# Patient Record
Sex: Female | Born: 1945
Health system: Southern US, Community
[De-identification: ages and names within clinical notes are randomized; demographics above are authoritative.]

## PROBLEM LIST (undated history)

## (undated) DIAGNOSIS — G629 Polyneuropathy, unspecified: Secondary | ICD-10-CM

## (undated) DIAGNOSIS — I10 Essential (primary) hypertension: Secondary | ICD-10-CM

## (undated) DIAGNOSIS — H269 Unspecified cataract: Secondary | ICD-10-CM

## (undated) DIAGNOSIS — K219 Gastro-esophageal reflux disease without esophagitis: Secondary | ICD-10-CM

## (undated) DIAGNOSIS — M8580 Other specified disorders of bone density and structure, unspecified site: Secondary | ICD-10-CM

## (undated) DIAGNOSIS — M797 Fibromyalgia: Secondary | ICD-10-CM

## (undated) DIAGNOSIS — F419 Anxiety disorder, unspecified: Secondary | ICD-10-CM

## (undated) DIAGNOSIS — R413 Other amnesia: Secondary | ICD-10-CM

## (undated) DIAGNOSIS — S62109A Fracture of unspecified carpal bone, unspecified wrist, initial encounter for closed fracture: Secondary | ICD-10-CM

## (undated) DIAGNOSIS — M1990 Unspecified osteoarthritis, unspecified site: Secondary | ICD-10-CM

## (undated) DIAGNOSIS — K589 Irritable bowel syndrome without diarrhea: Secondary | ICD-10-CM

## (undated) DIAGNOSIS — R51 Headache: Secondary | ICD-10-CM

## (undated) DIAGNOSIS — E785 Hyperlipidemia, unspecified: Secondary | ICD-10-CM

## (undated) DIAGNOSIS — E559 Vitamin D deficiency, unspecified: Secondary | ICD-10-CM

## (undated) DIAGNOSIS — IMO0002 Reserved for concepts with insufficient information to code with codable children: Secondary | ICD-10-CM

## (undated) HISTORY — PX: TOE SURGERY: SHX1073

## (undated) HISTORY — PX: APPENDECTOMY: SHX54

## (undated) HISTORY — PX: BREAST EXCISIONAL BIOPSY: SUR124

## (undated) HISTORY — PX: ABDOMINAL HYSTERECTOMY: SHX81

## (undated) HISTORY — DX: Essential (primary) hypertension: I10

## (undated) HISTORY — PX: WISDOM TOOTH EXTRACTION: SHX21

## (undated) HISTORY — DX: Other specified disorders of bone density and structure, unspecified site: M85.80

## (undated) HISTORY — DX: Hyperlipidemia, unspecified: E78.5

## (undated) HISTORY — DX: Unspecified cataract: H26.9

## (undated) HISTORY — DX: Irritable bowel syndrome, unspecified: K58.9

## (undated) HISTORY — DX: Headache: R51

## (undated) HISTORY — DX: Reserved for concepts with insufficient information to code with codable children: IMO0002

## (undated) HISTORY — PX: OOPHORECTOMY: SHX86

## (undated) HISTORY — PX: CHOLECYSTECTOMY: SHX55

## (undated) HISTORY — DX: Fibromyalgia: M79.7

## (undated) HISTORY — DX: Other amnesia: R41.3

## (undated) HISTORY — DX: Polyneuropathy, unspecified: G62.9

## (undated) HISTORY — PX: BLADDER SURGERY: SHX569

## (undated) HISTORY — DX: Gastro-esophageal reflux disease without esophagitis: K21.9

## (undated) HISTORY — DX: Fracture of unspecified carpal bone, unspecified wrist, initial encounter for closed fracture: S62.109A

## (undated) HISTORY — DX: Unspecified osteoarthritis, unspecified site: M19.90

## (undated) HISTORY — DX: Vitamin D deficiency, unspecified: E55.9

## (undated) HISTORY — DX: Anxiety disorder, unspecified: F41.9

---

## 1998-09-20 ENCOUNTER — Emergency Department (HOSPITAL_COMMUNITY): Admission: EM | Admit: 1998-09-20 | Discharge: 1998-09-20 | Payer: Self-pay | Admitting: Emergency Medicine

## 1998-09-20 ENCOUNTER — Encounter: Payer: Self-pay | Admitting: Emergency Medicine

## 1998-09-24 ENCOUNTER — Encounter: Admission: RE | Admit: 1998-09-24 | Discharge: 1998-12-23 | Payer: Self-pay | Admitting: *Deleted

## 1998-12-05 ENCOUNTER — Encounter: Admission: RE | Admit: 1998-12-05 | Discharge: 1998-12-05 | Payer: Self-pay | Admitting: *Deleted

## 2001-05-10 ENCOUNTER — Other Ambulatory Visit: Admission: RE | Admit: 2001-05-10 | Discharge: 2001-05-10 | Payer: Self-pay | Admitting: Family Medicine

## 2001-07-13 ENCOUNTER — Ambulatory Visit (HOSPITAL_COMMUNITY): Admission: RE | Admit: 2001-07-13 | Discharge: 2001-07-13 | Payer: Self-pay | Admitting: Internal Medicine

## 2001-07-15 ENCOUNTER — Emergency Department (HOSPITAL_COMMUNITY): Admission: EM | Admit: 2001-07-15 | Discharge: 2001-07-15 | Payer: Self-pay | Admitting: Emergency Medicine

## 2001-07-15 ENCOUNTER — Encounter: Payer: Self-pay | Admitting: Emergency Medicine

## 2002-11-23 ENCOUNTER — Emergency Department (HOSPITAL_COMMUNITY): Admission: EM | Admit: 2002-11-23 | Discharge: 2002-11-24 | Payer: Self-pay | Admitting: Emergency Medicine

## 2002-12-03 ENCOUNTER — Emergency Department (HOSPITAL_COMMUNITY): Admission: EM | Admit: 2002-12-03 | Discharge: 2002-12-03 | Payer: Self-pay | Admitting: *Deleted

## 2003-05-11 ENCOUNTER — Encounter (INDEPENDENT_AMBULATORY_CARE_PROVIDER_SITE_OTHER): Payer: Self-pay | Admitting: Internal Medicine

## 2003-05-11 ENCOUNTER — Ambulatory Visit (HOSPITAL_COMMUNITY): Admission: RE | Admit: 2003-05-11 | Discharge: 2003-05-11 | Payer: Self-pay | Admitting: Internal Medicine

## 2003-05-18 ENCOUNTER — Ambulatory Visit (HOSPITAL_COMMUNITY): Admission: RE | Admit: 2003-05-18 | Discharge: 2003-05-18 | Payer: Self-pay | Admitting: Internal Medicine

## 2003-05-18 ENCOUNTER — Encounter (INDEPENDENT_AMBULATORY_CARE_PROVIDER_SITE_OTHER): Payer: Self-pay | Admitting: Internal Medicine

## 2003-07-05 ENCOUNTER — Ambulatory Visit (HOSPITAL_BASED_OUTPATIENT_CLINIC_OR_DEPARTMENT_OTHER): Admission: RE | Admit: 2003-07-05 | Discharge: 2003-07-05 | Payer: Self-pay | Admitting: Orthopedic Surgery

## 2003-07-05 ENCOUNTER — Encounter (INDEPENDENT_AMBULATORY_CARE_PROVIDER_SITE_OTHER): Payer: Self-pay | Admitting: Specialist

## 2003-08-01 ENCOUNTER — Ambulatory Visit (HOSPITAL_COMMUNITY): Admission: RE | Admit: 2003-08-01 | Discharge: 2003-08-01 | Payer: Self-pay | Admitting: Family Medicine

## 2003-08-01 ENCOUNTER — Encounter: Payer: Self-pay | Admitting: Family Medicine

## 2004-11-20 ENCOUNTER — Observation Stay (HOSPITAL_COMMUNITY): Admission: EM | Admit: 2004-11-20 | Discharge: 2004-11-21 | Payer: Self-pay | Admitting: Emergency Medicine

## 2004-11-20 ENCOUNTER — Ambulatory Visit: Payer: Self-pay | Admitting: *Deleted

## 2004-11-21 ENCOUNTER — Ambulatory Visit: Payer: Self-pay

## 2005-03-10 ENCOUNTER — Encounter: Admission: RE | Admit: 2005-03-10 | Discharge: 2005-03-25 | Payer: Self-pay | Admitting: Orthopedic Surgery

## 2005-10-02 ENCOUNTER — Other Ambulatory Visit: Admission: RE | Admit: 2005-10-02 | Discharge: 2005-10-02 | Payer: Self-pay | Admitting: Family Medicine

## 2006-01-01 ENCOUNTER — Ambulatory Visit: Payer: Self-pay | Admitting: Internal Medicine

## 2006-06-04 ENCOUNTER — Emergency Department (HOSPITAL_COMMUNITY): Admission: EM | Admit: 2006-06-04 | Discharge: 2006-06-05 | Payer: Self-pay | Admitting: Emergency Medicine

## 2007-02-17 ENCOUNTER — Ambulatory Visit: Payer: Self-pay | Admitting: Internal Medicine

## 2008-10-16 ENCOUNTER — Encounter: Admission: RE | Admit: 2008-10-16 | Discharge: 2008-11-02 | Payer: Self-pay | Admitting: Orthopedic Surgery

## 2008-11-03 ENCOUNTER — Encounter: Admission: RE | Admit: 2008-11-03 | Discharge: 2008-12-11 | Payer: Self-pay | Admitting: Orthopedic Surgery

## 2009-02-16 ENCOUNTER — Observation Stay (HOSPITAL_COMMUNITY): Admission: EM | Admit: 2009-02-16 | Discharge: 2009-02-17 | Payer: Self-pay | Admitting: Emergency Medicine

## 2009-02-16 ENCOUNTER — Ambulatory Visit: Payer: Self-pay | Admitting: Cardiovascular Disease

## 2009-02-27 DIAGNOSIS — R079 Chest pain, unspecified: Secondary | ICD-10-CM | POA: Insufficient documentation

## 2009-02-27 DIAGNOSIS — Z8711 Personal history of peptic ulcer disease: Secondary | ICD-10-CM | POA: Insufficient documentation

## 2009-02-27 DIAGNOSIS — R002 Palpitations: Secondary | ICD-10-CM | POA: Insufficient documentation

## 2009-02-27 DIAGNOSIS — K5730 Diverticulosis of large intestine without perforation or abscess without bleeding: Secondary | ICD-10-CM | POA: Insufficient documentation

## 2009-02-27 DIAGNOSIS — E785 Hyperlipidemia, unspecified: Secondary | ICD-10-CM | POA: Insufficient documentation

## 2009-02-27 DIAGNOSIS — IMO0001 Reserved for inherently not codable concepts without codable children: Secondary | ICD-10-CM | POA: Insufficient documentation

## 2009-02-27 DIAGNOSIS — J309 Allergic rhinitis, unspecified: Secondary | ICD-10-CM | POA: Insufficient documentation

## 2009-02-27 DIAGNOSIS — M1990 Unspecified osteoarthritis, unspecified site: Secondary | ICD-10-CM | POA: Insufficient documentation

## 2009-02-28 ENCOUNTER — Encounter: Payer: Self-pay | Admitting: Cardiovascular Disease

## 2009-02-28 ENCOUNTER — Ambulatory Visit: Payer: Self-pay

## 2009-02-28 ENCOUNTER — Ambulatory Visit: Payer: Self-pay | Admitting: Cardiovascular Disease

## 2009-02-28 DIAGNOSIS — G609 Hereditary and idiopathic neuropathy, unspecified: Secondary | ICD-10-CM | POA: Insufficient documentation

## 2009-04-28 ENCOUNTER — Emergency Department (HOSPITAL_COMMUNITY): Admission: EM | Admit: 2009-04-28 | Discharge: 2009-04-28 | Payer: Self-pay | Admitting: Emergency Medicine

## 2009-05-03 ENCOUNTER — Telehealth: Payer: Self-pay | Admitting: Cardiovascular Disease

## 2009-05-29 ENCOUNTER — Ambulatory Visit (HOSPITAL_COMMUNITY): Admission: RE | Admit: 2009-05-29 | Discharge: 2009-05-29 | Payer: Self-pay | Admitting: Urology

## 2009-07-12 ENCOUNTER — Encounter (INDEPENDENT_AMBULATORY_CARE_PROVIDER_SITE_OTHER): Payer: Self-pay | Admitting: *Deleted

## 2009-11-06 ENCOUNTER — Ambulatory Visit: Payer: Self-pay | Admitting: Internal Medicine

## 2009-11-06 DIAGNOSIS — K589 Irritable bowel syndrome without diarrhea: Secondary | ICD-10-CM | POA: Insufficient documentation

## 2009-11-06 DIAGNOSIS — Z9189 Other specified personal risk factors, not elsewhere classified: Secondary | ICD-10-CM | POA: Insufficient documentation

## 2009-11-07 DIAGNOSIS — R1013 Epigastric pain: Secondary | ICD-10-CM | POA: Insufficient documentation

## 2009-11-08 ENCOUNTER — Ambulatory Visit: Payer: Self-pay | Admitting: Internal Medicine

## 2009-11-08 ENCOUNTER — Ambulatory Visit (HOSPITAL_COMMUNITY): Admission: RE | Admit: 2009-11-08 | Discharge: 2009-11-08 | Payer: Self-pay | Admitting: Internal Medicine

## 2009-11-09 ENCOUNTER — Encounter: Payer: Self-pay | Admitting: Internal Medicine

## 2010-02-06 ENCOUNTER — Encounter: Admission: RE | Admit: 2010-02-06 | Discharge: 2010-02-06 | Payer: Self-pay | Admitting: Family Medicine

## 2010-03-12 ENCOUNTER — Encounter: Payer: Self-pay | Admitting: Internal Medicine

## 2010-10-06 ENCOUNTER — Encounter: Admission: RE | Admit: 2010-10-06 | Discharge: 2010-10-06 | Payer: Self-pay | Admitting: Orthopedic Surgery

## 2010-10-14 ENCOUNTER — Encounter
Admission: RE | Admit: 2010-10-14 | Discharge: 2010-12-03 | Payer: Self-pay | Source: Home / Self Care | Attending: Orthopedic Surgery | Admitting: Orthopedic Surgery

## 2010-10-22 ENCOUNTER — Ambulatory Visit (HOSPITAL_COMMUNITY)
Admission: RE | Admit: 2010-10-22 | Discharge: 2010-10-22 | Payer: Self-pay | Source: Home / Self Care | Attending: Internal Medicine | Admitting: Internal Medicine

## 2010-10-31 ENCOUNTER — Emergency Department (HOSPITAL_COMMUNITY)
Admission: EM | Admit: 2010-10-31 | Discharge: 2010-10-31 | Payer: Self-pay | Source: Home / Self Care | Admitting: Emergency Medicine

## 2010-11-24 ENCOUNTER — Encounter: Payer: Self-pay | Admitting: Internal Medicine

## 2010-12-03 NOTE — Assessment & Plan Note (Signed)
Summary: RT ABD PAIN/GU   Primary Care Provider:  Paulita Cradle  Chief Complaint:  abd pain.  History of Present Illness:  65 year old lady comes to see me with a three-month history of right upper quadrant abdominal pain states she noticed that she she was helping move her ailinging, sick brother in the bed. Has had intermittent symptoms ever since she states symptoms precipitated by caffeine intake. She denies typical reflux symptoms which are now well controlled on omeprazole 20 mg orally twice daily. She's having identified her dysphagia. History of distant EGD with esophageal dilation with good results. Gallbladder removed some 10 years ago. Recent ultrasound of the RUQ demonstrated stable hemangiomas,  no biliary dilation or other abnormality. Her she's not any associated nausea, vomiting no melena or hematochezia ; last colonoscopy was back in 2001/03/24 ( diverticulosis). She denies weight loss. She denies early satiety. She has been seenin this practice in years past by Dr. Karilyn Cota  Long history of right-sided abdominal pain felt to be related to irritable bowel syndrome.  She does have a history of fibromyalgia-symptoms well-controlled currently on Neurontin. Distant history of peptic ulcer disease and H. pylori status post treatment per patient report.    Current Medications (verified): 1)  Aspirin 81 Mg Tbec (Aspirin) .... Take One Tablet By Mouth Daily 2)  Zocor 40 Mg Tabs (Simvastatin) .Marland Kitchen.. 1 Tab By Mouth Once Daily 3)  Omeprazole 20 Mg Cpdr (Omeprazole) .... Take 1 Tablet By Mouth Two Times A Day 4)  Neurontin 300 Mg Caps (Gabapentin) .... Take 1 Tablet By Mouth Three Times A Day  Allergies (verified): 1)  ! Aspirin  Past History:  Past Medical History: Last updated: 02/27/2009 Current Problems:  PALPITATIONS (ICD-785.1) CHEST PAIN (ICD-786.50) PEPTIC ULCER DISEASE, HX OF (ICD-V12.71) GERD (ICD-530.81) DYSLIPIDEMIA (ICD-272.4) FIBROMYALGIA (ICD-729.1) DIVERTICULOSIS, MILD  (ICD-562.10) OSTEOARTHRITIS (ICD-715.90) ALLERGIC RHINITIS (ICD-477.9)   history of hiatal hernia  acne rosacea. history of migraines.  Family History: Last updated: November 16, 2009  Mother deceased at 4 with history CHF.  Father   deceased in his 75's from MI.  She has several siblings who have positive   coronary artery disease, 1 sister with CHF.  One brother deceased with lymphoma 03/24/09.  Social History: Last updated: 02/27/2009 She lives in [place] with her husband.  She is retired   from [employer].  She did a lot of floor work.  Denies any   tobacco, EtOH, or herbal medication use.  She follows a regular diet.   No routine exercise secondary to fibromyalgia discomfort.   Past Surgical History:  hysterectomy  cholecystectomy  Excisional biopsy, mass of left long finger. 2nd toe on right foot  Family History:  Mother deceased at 67 with history CHF.  Father   deceased in his 95's from MI.  She has several siblings who have positive   coronary artery disease, 1 sister with CHF.  One brother deceased with lymphoma Mar 24, 2009.  Vital Signs:  Patient profile:   65 year old female Height:      62.5 inches Weight:      192 pounds BMI:     34.68 Temp:     98.1 degrees F oral BP sitting:   150 / 80  (left arm) Cuff size:   regular  Vitals Entered By: Cloria Spring LPN 16-Nov-2009 2:40 PM)  Physical Exam  General:  pleasant alert conversant no acute distress Eyes:  no scleral icterus the conjunctiva pink Abdomen:  nondistended somewhat obese positive bowel sounds  she does have localized epigastric tenderness to palpation no appreciable mass or hepatosplenomegaly extremities have no edema  Impression & Recommendations: Impression: 65 year old lady with a right upper quadrant abdominal discomfort noted after a strenuous physical exercise. Interestingly, symptoms worsened with intake of caffeine. She has notable epigastric tenderness on physical examination today.  She  has a history of fibromyalgia. Her gallbladder is out.  Recent right upper quadrant ultrasound reassuring as far as any distal GI pathology is concerned.  Recommendations: Diagnostic EGD in the near future. Risks, benefits, limitations, alternatives were reviewed. Her questions are answered. Further recommendations once a diagnostic EGD has been performed.            Appended Document: Orders Update-charge    Clinical Lists Changes  Problems: Added new problem of EPIGASTRIC PAIN (ICD-789.06) Orders: Added new Service order of Est. Patient Level IV (14782) - Signed

## 2010-12-03 NOTE — Letter (Signed)
Summary: ABD US/[facility]  ABD US/[facility]   Imported By: Diana Eves 11/09/2009 11:21:43  _____________________________________________________________________  External Attachment:    Type:   Image     Comment:   External Document

## 2010-12-03 NOTE — Letter (Signed)
Summary: External Other  External Other   Imported By: Peggyann Shoals 03/12/2010 09:36:30  _____________________________________________________________________  External Attachment:    Type:   Image     Comment:   External Document

## 2010-12-03 NOTE — Progress Notes (Signed)
Summary: METORPROL D/C VS SHORT TERM  Phone Note Call from Patient Call back at Home Phone [phone]   Caller: Patient Reason for Call: Talk to Nurse Summary of Call: WHEN SHOULD PT BE D/C FROM METOPROLOL  PT WAS TOLD SHE WILL BE ON MEDS FOR A SHORT TERM.  Initial call taken by: Lorne Skeens,  May 03, 2009 2:59 PM  Follow-up for Phone Call        Left message to call back Deliah Goody, RN  May 03, 2009 4:24 PM   Additional Follow-up for Phone Call Additional follow up Details #1::        spoke with pt, she will take 1/2 tablet everyday for the next 2 days and then stop metoprolol. she will call with problems Deliah Goody, RN  May 03, 2009 4:56 PM

## 2010-12-03 NOTE — Letter (Signed)
Summary: EGD ORDER  EGD ORDER   Imported By: Ave Filter 11/09/2009 13:08:24  _____________________________________________________________________  External Attachment:    Type:   Image     Comment:   External Document

## 2010-12-03 NOTE — Letter (Signed)
Summary: Appointment - Reminder 2  [facility], Main Office  [address]   [place], [place] [address]   Phone: [phone]  Fax: [phone]     July 12, 2009 MRN: [id number]   [facility] [address] [place], [place]  [address]   Dear [name],  Our records indicate that it is time to schedule a follow-up appointment with Dr. Eden Emms. It is very important that we reach you to schedule this appointment. We look forward to participating in your health care needs. Please contact us at the number listed above at your earliest convenience to schedule your appointment.  If you are unable to make an appointment at this time, give [place] a call so we can update our records.  Sincerely,   [name] [facility] Scheduling Team

## 2010-12-03 NOTE — Assessment & Plan Note (Signed)
Summary: eph   CC:  echo.  History of Present Illness: Charlotte Aguirre is a patient of Birdena Jubilee at [facility]. she was recently seen in the hospital by Dr. Sherryl Manges.  No palpitations.  Her telemetry showed PACs.  No evidence of SVT or atrial fibrillation.  He was discharged home on a beta blocker.  Reviewed her discharge summary.  She had a 2-D echocardiogram today which Elster reviewed.  She had mild left atrial enlargement.  There is trivial MR.  The remainder of the exam was normal with an ejection fraction of 65%.  Since discharge the palpitations have improved.  She thinks the beta blocker helps.  She gets occasional palpitations at night when she lays down.  She has not any significant chest pain exertional dyspnea syncope.  She does complain of neuropathy in her feet.  She has a history of fibromyalgia.  She was restarted on Cymbalta which has helped this.  I encouraged her to followup with Caryn Section regarding the neuropathy.  She seems to think she has had a B12 folate and thyroid test recently.  There is no previous history of claudication or PVD.  Current Problems (verified): 1)  Palpitations  (ICD-785.1) 2)  Chest Pain  (ICD-786.50) 3)  Peptic Ulcer Disease, Hx of  (ICD-V12.71) 4)  Gerd  (ICD-530.81) 5)  Dyslipidemia  (ICD-272.4) 6)  Fibromyalgia  (ICD-729.1) 7)  Diverticulosis, Mild  (ICD-562.10) 8)  Osteoarthritis  (ICD-715.90) 9)  Allergic Rhinitis  (ICD-477.9)  Current Medications (verified): 1)  Protonix 40 Mg Tbec (Pantoprazole Sodium) .Marland Kitchen.. 1 Tab By Mouth Once Daily 2)  Aspirin 81 Mg Tbec (Aspirin) .... Take One Tablet By Mouth Daily 3)  Lyrica 75 Mg Caps (Pregabalin) .Marland Kitchen.. 1 Tab By Mouth Two Times A Day 4)  Metoprolol Succinate 25 Mg Xr24h-Tab (Metoprolol Succinate) .... Take One Tablet By Mouth Daily 5)  Zocor 40 Mg Tabs (Simvastatin) .Marland Kitchen.. 1 Tab By Mouth Once Daily  Allergies (verified): No Known Drug Allergies  Past History:  Past Medical History:    Current Problems:       PALPITATIONS (ICD-785.1)    CHEST PAIN (ICD-786.50)    PEPTIC ULCER DISEASE, HX OF (ICD-V12.71)    GERD (ICD-530.81)    DYSLIPIDEMIA (ICD-272.4)    FIBROMYALGIA (ICD-729.1)    DIVERTICULOSIS, MILD (ICD-562.10)    OSTEOARTHRITIS (ICD-715.90)    ALLERGIC RHINITIS (ICD-477.9)     history of hiatal hernia     acne rosacea.    history of migraines. (02/27/2009)  Past Surgical History:     hysterectomy     cholecystectomy     Excisional biopsy, mass of left long finger. (02/27/2009)  Family History:     Mother deceased at 72 with history CHF.  Father      deceased in his 2s from MI.  She has several siblings who have positive      coronary artery disease, 1 sister with CHF.  (02/27/2009)  Social History:    She lives in [place] with her husband.  She is retired      from [employer].  She did a lot of floor work.  Denies any      tobacco, EtOH, or herbal medication use.  She follows a regular diet.      No routine exercise secondary to fibromyalgia discomfort.  (02/27/2009)  Review of Systems       Denies fever, malais, weight loss, blurry vision, decreased visual acuity, cough, sputum, SOB, hemoptysis, pleuritic pain, , heartburn, abdominal pain, melena,  lower extremity edema, claudication, or rash.   Vital Signs:  Patient profile:   65 year old female Height:      62 inches Weight:      190 pounds Pulse rate:   68 / minute Resp:     12 per minute BP sitting:   132 / 77  (right arm)  Vitals Entered By: Kem Parkinson (February 28, 2009 1:34 PM)  Physical Exam  General:  Affect appropriate Healthy:  appears stated age HEENT: normal Neck supple with no adenopathy JVP normal no bruits no thyromegaly Lungs clear with no wheezing and good diaphragmatic motion Heart:  S1/S2 no murmur,rub, gallop or click PMI normal Abdomen: benighn, BS positve, no tenderness, no AAA no bruit.  No HSM or HJR Distal pulses intact with no bruits No edema Neuro non-focal Skin  warm and dry    Impression & Recommendations:  Problem # 1:  PALPITATIONS (ICD-785.1) Benign and improved with BB.  No evidence of structural heart disese.  Cont current therapy Her updated medication list for this problem includes:    Aspirin 81 Mg Tbec (Aspirin) .Marland Kitchen... Take one tablet by mouth daily    Metoprolol Succinate 25 Mg Xr24h-tab (Metoprolol succinate) .Marland Kitchen... Take one tablet by mouth daily  Problem # 2:  PERIPHERAL NEUROPATHY (ICD-356.9) F/U Paulita Cradle ? neurontin  Problem # 3:  DYSLIPIDEMIA (ICD-272.4) Lipid and Liver profile in 6 months Her updated medication list for this problem includes:    Zocor 40 Mg Tabs (Simvastatin) .Marland Kitchen... 1 tab by mouth once daily  Patient Instructions: 1)  Your physician recommends that you schedule a follow-up appointment in:  6 month

## 2011-01-13 LAB — DIFFERENTIAL
Basophils Absolute: 0 10*3/uL (ref 0.0–0.1)
Basophils Relative: 0 % (ref 0–1)
Eosinophils Absolute: 0 10*3/uL (ref 0.0–0.7)
Eosinophils Relative: 0 % (ref 0–5)
Lymphocytes Relative: 4 % — ABNORMAL LOW (ref 12–46)
Lymphs Abs: 0.4 10*3/uL — ABNORMAL LOW (ref 0.7–4.0)
Monocytes Absolute: 0.2 10*3/uL (ref 0.1–1.0)
Monocytes Relative: 2 % — ABNORMAL LOW (ref 3–12)
Neutro Abs: 9.5 10*3/uL — ABNORMAL HIGH (ref 1.7–7.7)
Neutrophils Relative %: 94 % — ABNORMAL HIGH (ref 43–77)

## 2011-01-13 LAB — BASIC METABOLIC PANEL
BUN: 17 mg/dL (ref 6–23)
CO2: 27 mEq/L (ref 19–32)
Calcium: 9 mg/dL (ref 8.4–10.5)
Chloride: 105 mEq/L (ref 96–112)
Creatinine, Ser: 0.96 mg/dL (ref 0.4–1.2)
GFR calc Af Amer: >60 mL/min (ref >60)
GFR calc non Af Amer: 58 mL/min — ABNORMAL LOW (ref >60)
Glucose, Bld: 136 mg/dL — ABNORMAL HIGH (ref 70–99)
Potassium: 3.7 mEq/L (ref 3.5–5.1)
Sodium: 140 mEq/L (ref 135–145)

## 2011-01-13 LAB — CBC
HCT: 41.2 % (ref 36.0–46.0)
Hemoglobin: 13.8 g/dL (ref 12.0–15.0)
MCH: 26.3 pg (ref 26.0–34.0)
MCHC: 33.5 g/dL (ref 30.0–36.0)
MCV: 78.6 fL (ref 78.0–100.0)
Platelets: 214 10*3/uL (ref 150–400)
RBC: 5.24 MIL/uL — ABNORMAL HIGH (ref 3.87–5.11)
RDW: 14.7 % (ref 11.5–15.5)
WBC: 10.1 10*3/uL (ref 4.0–10.5)

## 2011-02-10 LAB — CBC
HCT: 38.9 % (ref 36.0–46.0)
Hemoglobin: 13.2 g/dL (ref 12.0–15.0)
MCHC: 33.9 g/dL (ref 30.0–36.0)
MCV: 84.7 fL (ref 78.0–100.0)
Platelets: 205 10*3/uL (ref 150–400)
RBC: 4.6 MIL/uL (ref 3.87–5.11)
RDW: 13.3 % (ref 11.5–15.5)
WBC: 8.6 10*3/uL (ref 4.0–10.5)

## 2011-02-10 LAB — URINE MICROSCOPIC-ADD ON

## 2011-02-10 LAB — URINALYSIS, ROUTINE W REFLEX MICROSCOPIC
Bilirubin Urine: NEGATIVE
Glucose, UA: NEGATIVE mg/dL
Ketones, ur: NEGATIVE mg/dL
Nitrite: NEGATIVE
Protein, ur: NEGATIVE mg/dL
Specific Gravity, Urine: 1.02 (ref 1.005–1.030)
Urobilinogen, UA: 0.2 mg/dL (ref 0.0–1.0)
pH: 7 (ref 5.0–8.0)

## 2011-02-10 LAB — BASIC METABOLIC PANEL
BUN: 10 mg/dL (ref 6–23)
CO2: 27 mEq/L (ref 19–32)
Calcium: 8.5 mg/dL (ref 8.4–10.5)
Chloride: 104 mEq/L (ref 96–112)
Creatinine, Ser: 0.72 mg/dL (ref 0.4–1.2)
GFR calc Af Amer: >60 mL/min (ref >60)
GFR calc non Af Amer: >60 mL/min (ref >60)
Glucose, Bld: 119 mg/dL — ABNORMAL HIGH (ref 70–99)
Potassium: 3.8 mEq/L (ref 3.5–5.1)
Sodium: 141 mEq/L (ref 135–145)

## 2011-02-10 LAB — DIFFERENTIAL
Basophils Absolute: 0 10*3/uL (ref 0.0–0.1)
Basophils Relative: 0 % (ref 0–1)
Eosinophils Absolute: 0.1 10*3/uL (ref 0.0–0.7)
Eosinophils Relative: 1 % (ref 0–5)
Lymphocytes Relative: 16 % (ref 12–46)
Lymphs Abs: 1.3 10*3/uL (ref 0.7–4.0)
Monocytes Absolute: 0.3 10*3/uL (ref 0.1–1.0)
Monocytes Relative: 4 % (ref 3–12)
Neutro Abs: 6.9 10*3/uL (ref 1.7–7.7)
Neutrophils Relative %: 80 % — ABNORMAL HIGH (ref 43–77)

## 2011-02-12 LAB — CBC
HCT: 39 % (ref 36.0–46.0)
HCT: 41 % (ref 36.0–46.0)
HCT: 41.7 % (ref 36.0–46.0)
Hemoglobin: 13 g/dL (ref 12.0–15.0)
Hemoglobin: 13.4 g/dL (ref 12.0–15.0)
Hemoglobin: 14.1 g/dL (ref 12.0–15.0)
MCHC: 32.6 g/dL (ref 30.0–36.0)
MCHC: 33.3 g/dL (ref 30.0–36.0)
MCHC: 33.7 g/dL (ref 30.0–36.0)
MCV: 84.9 fL (ref 78.0–100.0)
MCV: 85.2 fL (ref 78.0–100.0)
MCV: 85.7 fL (ref 78.0–100.0)
Platelets: 205 10*3/uL (ref 150–400)
Platelets: 221 10*3/uL (ref 150–400)
Platelets: 223 10*3/uL (ref 150–400)
RBC: 4.55 MIL/uL (ref 3.87–5.11)
RBC: 4.82 MIL/uL (ref 3.87–5.11)
RBC: 4.91 MIL/uL (ref 3.87–5.11)
RDW: 13.1 % (ref 11.5–15.5)
RDW: 13.3 % (ref 11.5–15.5)
RDW: 13.4 % (ref 11.5–15.5)
WBC: 7.5 10*3/uL (ref 4.0–10.5)
WBC: 8.2 10*3/uL (ref 4.0–10.5)
WBC: 8.7 10*3/uL (ref 4.0–10.5)

## 2011-02-12 LAB — DIFFERENTIAL
Basophils Absolute: 0 10*3/uL (ref 0.0–0.1)
Basophils Absolute: 0.1 10*3/uL (ref 0.0–0.1)
Basophils Relative: 1 % (ref 0–1)
Basophils Relative: 1 % (ref 0–1)
Eosinophils Absolute: 0.2 10*3/uL (ref 0.0–0.7)
Eosinophils Absolute: 0.2 10*3/uL (ref 0.0–0.7)
Eosinophils Relative: 2 % (ref 0–5)
Eosinophils Relative: 3 % (ref 0–5)
Lymphocytes Relative: 34 % (ref 12–46)
Lymphocytes Relative: 39 % (ref 12–46)
Lymphs Abs: 2.8 10*3/uL (ref 0.7–4.0)
Lymphs Abs: 2.9 10*3/uL (ref 0.7–4.0)
Monocytes Absolute: 0.5 10*3/uL (ref 0.1–1.0)
Monocytes Absolute: 0.5 10*3/uL (ref 0.1–1.0)
Monocytes Relative: 6 % (ref 3–12)
Monocytes Relative: 7 % (ref 3–12)
Neutro Abs: 3.8 10*3/uL (ref 1.7–7.7)
Neutro Abs: 4.8 10*3/uL (ref 1.7–7.7)
Neutrophils Relative %: 50 % (ref 43–77)
Neutrophils Relative %: 58 % (ref 43–77)

## 2011-02-12 LAB — HEMOGLOBIN A1C
Hgb A1c MFr Bld: 5.6 % (ref 4.6–6.1)
Mean Plasma Glucose: 114 mg/dL

## 2011-02-12 LAB — POCT I-STAT, CHEM 8
BUN: 14 mg/dL (ref 6–23)
Calcium, Ion: 1.18 mmol/L (ref 1.12–1.32)
Chloride: 106 mEq/L (ref 96–112)
Creatinine, Ser: 1.1 mg/dL (ref 0.4–1.2)
Glucose, Bld: 99 mg/dL (ref 70–99)
HCT: 43 % (ref 36.0–46.0)
Hemoglobin: 14.6 g/dL (ref 12.0–15.0)
Potassium: 4.2 mEq/L (ref 3.5–5.1)
Sodium: 142 mEq/L (ref 135–145)
TCO2: 27 mmol/L (ref 0–100)

## 2011-02-12 LAB — POCT CARDIAC MARKERS
CKMB, poc: 1.1 ng/mL (ref 1.0–8.0)
CKMB, poc: <1 ng/mL — ABNORMAL LOW (ref 1.0–8.0)
Myoglobin, poc: 42.3 ng/mL (ref 12–200)
Myoglobin, poc: 62.9 ng/mL (ref 12–200)
Troponin i, poc: <0.05 ng/mL (ref 0.00–0.09)
Troponin i, poc: <0.05 ng/mL (ref 0.00–0.09)

## 2011-02-12 LAB — COMPREHENSIVE METABOLIC PANEL
ALT: 16 U/L (ref 0–35)
AST: 18 U/L (ref 0–37)
Albumin: 3.1 g/dL — ABNORMAL LOW (ref 3.5–5.2)
Alkaline Phosphatase: 99 U/L (ref 39–117)
BUN: 14 mg/dL (ref 6–23)
CO2: 29 mEq/L (ref 19–32)
Calcium: 8.8 mg/dL (ref 8.4–10.5)
Chloride: 106 mEq/L (ref 96–112)
Creatinine, Ser: 0.82 mg/dL (ref 0.4–1.2)
GFR calc Af Amer: >60 mL/min (ref >60)
GFR calc non Af Amer: >60 mL/min (ref >60)
Glucose, Bld: 95 mg/dL (ref 70–99)
Potassium: 4 mEq/L (ref 3.5–5.1)
Sodium: 140 mEq/L (ref 135–145)
Total Bilirubin: 0.1 mg/dL — ABNORMAL LOW (ref 0.3–1.2)
Total Protein: 5.8 g/dL — ABNORMAL LOW (ref 6.0–8.3)

## 2011-02-12 LAB — CK TOTAL AND CKMB (NOT AT ARMC)
CK, MB: 1.3 ng/mL (ref 0.3–4.0)
Relative Index: INVALID (ref 0.0–2.5)
Total CK: 70 U/L (ref 7–177)

## 2011-02-12 LAB — CARDIAC PANEL(CRET KIN+CKTOT+MB+TROPI)
CK, MB: 1 ng/mL (ref 0.3–4.0)
CK, MB: 1.1 ng/mL (ref 0.3–4.0)
Relative Index: INVALID (ref 0.0–2.5)
Relative Index: INVALID (ref 0.0–2.5)
Total CK: 49 U/L (ref 7–177)
Total CK: 65 U/L (ref 7–177)
Troponin I: <0.01 ng/mL (ref 0.00–0.06)
Troponin I: <0.01 ng/mL (ref 0.00–0.06)

## 2011-02-12 LAB — LIPID PANEL
Cholesterol: 162 mg/dL (ref 0–200)
HDL: 33 mg/dL — ABNORMAL LOW (ref >39)
LDL Cholesterol: 93 mg/dL (ref 0–99)
Total CHOL/HDL Ratio: 4.9 RATIO
Triglycerides: 180 mg/dL — ABNORMAL HIGH (ref <150)
VLDL: 36 mg/dL (ref 0–40)

## 2011-02-12 LAB — TSH: TSH: 1.526 u[IU]/mL (ref 0.350–4.500)

## 2011-02-12 LAB — BRAIN NATRIURETIC PEPTIDE: Pro B Natriuretic peptide (BNP): 60 pg/mL (ref 0.0–100.0)

## 2011-02-12 LAB — HEPARIN LEVEL (UNFRACTIONATED): Heparin Unfractionated: 0.32 IU/mL (ref 0.30–0.70)

## 2011-02-12 LAB — TROPONIN I: Troponin I: <0.01 ng/mL (ref 0.00–0.06)

## 2011-03-18 NOTE — H&P (Signed)
Charlotte Aguirre, Charlotte Aguirre                   ACCOUNT NO.:  [id number]   MEDICAL RECORD NO.:  [id number]          PATIENT TYPE:  OBV   LOCATION:  2011                         FACILITY:  [facility]   PHYSICIAN:  Charlotte Pick. Eden Emms, MD, FACCDATE OF BIRTH:  [birth year]   DATE OF ADMISSION:  02/16/2009  DATE OF DISCHARGE:                              HISTORY & PHYSICAL   PRIMARY CARDIOLOGIST:  Charlotte be Dr. Noralyn Pick. Eden Emms, MD, Charlotte Aguirre, previously  Charlotte Binning, MD [facility].   PRIMARY CARE PHYSICIAN:  The patient is followed by Charlotte Aguirre,  nurse practitioner at [facility].  Charlotte Aguirre is a  pleasant 65 year old Caucasian female, we initially evaluated back in  2006 at which time she presented for atypical chest pain, was admitted,  ruled out and discharged home.  She also at that time complained of  heart skipping beats and fluttering in her chest.  She had a stress  Myoview done outpatient that was within normal limits and has had no  problems since then.  She states she still has an occasional skipping,  palpitations in her heart.  The only change in her condition is  increased caffeine intake over this last week.  Yesterday, she noticed  she was having a lot of palpitations when she prepared for bed and when  she laid down.  She states it became more frequent and worrisome.  This  morning, she presented to [facility] where a  Holter monitor was placed on the patient.  She returned home, continued  to have palpitations.  The patient reports nothing was noted on EKG done  at the office there.  She states the palpitations continued and then she  experienced some lightheadedness and associated nausea.  This concerned  her.  She called back to the office and was told to call 911.  The  patient was transported here for further evaluation.  She currently is  in the emergency room in room #5.  Her telemetry monitor showing sinus  rhythm with frequent PACs, occasional  PVC.  A 12-lead EKG showing no  acute findings.  The patient continues to complain of palpitations and  skipping sensation in her chest.  She also describes chest discomfort.  She describes it as a sensation and she feels she needs to burp, a  little bit of pressure.  She denies any dizziness or nausea currently.   PAST MEDICAL HISTORY:  Palpitations; atypical chest pain; previous  stress Myoview negative in 2006; fibromyalgia, treated with Lyrica;  hysterectomy; cholecystectomy; dyslipidemia; GERD; diverticulosis;  previous GI ulcer.   SOCIAL HISTORY:  She lives in [place] with her husband.  She is retired  from [employer].  She did a lot of floor work.  Denies any  tobacco, EtOH, or herbal medication use.  She follows a regular diet.  No routine exercise secondary to fibromyalgia discomfort.   FAMILY HISTORY:  Mother deceased at 82 with history CHF.  Father  deceased in his 62s from MI.  She has several siblings who have positive  coronary artery disease, 1  sister with CHF.   REVIEW OF SYSTEMS:  Positive for chest discomfort as described above,  dizziness as described above, palpitations as described above.  The  patient complains of some numbness on the bottom of her feet that feels  like new pins and needles sensation and episode of nausea as described  above, otherwise has chronic myalgia.  All other systems reviewed and  negative.   ALLERGIES:  No known drug allergies.   MEDICINES:  1. Lyrica, the patient is unclear of dose.  2. Protonix 40.  3. Simvastatin, the patient is unclear of dose.  4. Aspirin 81.   PHYSICAL EXAMINATION:  VITAL SIGNS:  The patient is afebrile, heart rate  87, respirations 16, blood pressure 138/69, and sat 96% on room air.  GENERAL:  In no acute distress.  HEENT: Unremarkable.  NECK:  Supple without lymphadenopathy, bruit, or JVD.  CARDIOVASCULAR:  S1 and S2.  Regular rate and rhythm without murmurs,  rubs, or gallops.  LUNGS:  Clear to  auscultation bilaterally.  SKIN:  Warm and dry.  ABDOMEN:  Soft and nontender.  Positive bowel sounds.  LOWER EXTREMITIES:  Without clubbing, cyanosis, or edema.  NEUROLOGIC:  Alert and oriented x3.  CHEST:  The patient has no reproducible discomfort to deep palpation of  chest.   Chest x-ray showed right lung base atelectasis, otherwise no acute  findings.  EKG, sinus rhythm with PACs and PVCs.  No acute ST or T wave  changes.   LABORATORY DATA:  H&H 14.6 and 43, WBCs 8.2, and platelets 223,000.  Sodium 142, potassium 4.2, BUN 14, creatinine 1.1, and glucose 99.  First point-of-care markers are negative.   IMPRESSION:  Chest pain with palpitations.  The patient presenting with  frequent premature atrial contractions and occasional premature  ventricular contractions in the setting of increased caffeine intake  over the last week.  The patient also with known dyslipidemia,  fibromyalgia, previous negative stress Myoview, and strong family  history of coronary artery disease.  The patient Charlotte be admitted for 23-  hour observation, cycle cardiac markers, check a hemoglobin A1c  secondary to numbness and tingling in feet, check TSH regarding  palpitations, start the patient on Toprol-XL 25 mg daily.  Follow up in  the office with Dr. Eden Aguirre and outpatient echocardiogram.  I went ahead  and arranged this for February 28, 2009.  Echocardiogram at 1 p.m. and Dr.  Eden Aguirre at 1:45.  We Charlotte check blood work and forward a copy of labs to  Charlotte Aguirre at [facility].  If enzymes are negative, we Charlotte  plan on discharging in the morning.  If possible need to consider  cardiac catheterization.  Charlotte Aguirre has been in to examine and  assess the patient, agrees with plan of care.  We Charlotte continue home  aspirin, statin, and Lyrica.  We Charlotte need to clarify home dose on her  Lyrica and Protonix.      Charlotte Aguirre, ACNP      Charlotte Pick. Eden Emms, MD, [facility]  Electronically  Signed    MB/MEDQ  D:  02/16/2009  T:  02/17/2009  Job:  [id number]   cc:   Charlotte Aguirre

## 2011-03-18 NOTE — Discharge Summary (Signed)
NAMEKACIE, HUXTABLE                   ACCOUNT NO.:  [id number]   MEDICAL RECORD NO.:  [id number]          PATIENT TYPE:  OBV   LOCATION:  2011                         FACILITY:  [facility]   PHYSICIAN:  Duke Salvia, MD, FACCDATE OF BIRTH:  [birth year]   DATE OF ADMISSION:  02/16/2009  DATE OF DISCHARGE:  02/17/2009                               DISCHARGE SUMMARY   CARDIOLOGIST:  Theron Arista C. Eden Emms, MD, [facility]   PRIMARY CARE PHYSICIAN:  Paulita Cradle, nurse practitioner at [facility]   REASON FOR ADMISSION:  Chest pain and palpitations.   DISCHARGE DIAGNOSES:  1. Chest pain and palpitations.      a.     Suspect symptomatic premature atrial contractions.  2. Negative stress Myoview 2006.  3. Fibromyalgia.  4. Dyslipidemia.  5. Gastroesophageal reflux disease.  6. Peptic ulcer disease.   ADMISSION HISTORY:  [name] is a 65 year old female with the above  noted past medical history who presented with complaints of chest  discomfort and palpitations.  She had noted PACs on her monitor.  She  also noted increased caffeine intake.  She was admitted for observation  to rule out myocardial infarction by enzymes.  She was also started on  beta-blocker therapy.   HOSPITAL COURSE:  The patient had negative cardiac enzymes x3.  TSH was  normal at 1.526.  Chest x-ray showed right lung basilar atelectasis.  She remained stable overnight and noted just brief episodes of chest  discomfort on the morning of February 17, 2009.  She was evaluated by Dr.  Graciela Husbands and felt to be stable enough for discharge to home.  She has been  set up for a followup in our office with an echocardiogram and an  appointment with Dr. Eden Emms.  Her other medications will be continued as  previously taken.   LABS AND X-RAY DATA:  Hemoglobin 13.4, potassium 4, BUN 14, creatinine  0.82.  LFTs okay, albumin 3.1, hemoglobin A1c 5.6.  Cardiac markers as  noted above.  BNP 60, total cholesterol 162,  triglycerides 180, HDL 33,  LDL 93.  TSH as outlined above.  Chest x-ray as outlined above.   MEDICATIONS:  1. At discharge Lyrica 75 mg twice a day.  2. Protonix 40 mg a day.  3. Simvastatin 40 mg a day.  4. Aspirin 81 mg a day.  5. Toprol XL 25 mg daily - this is a new medication.   ALLERGIES:  No known drug allergies.   DIET:  Low-fat, low-sodium diet.   WOUND CARE:  Is not applicable.   ACTIVITY:  She is to increase her activity slowly.   FOLLOWUP:  1. She will have an echocardiogram April 28 at 1:00 in our office in      [place].  2. She will see Dr. Eden Emms on April 28 at 1:45 at [facility]      in [place].  3. She should contact Paulita Cradle, NP for followup as directed.   Total physician and PA time greater than 30 minutes on this discharge.  Tereso Newcomer, PA-C      Duke Salvia, MD, [facility]  Electronically Signed    SW/MEDQ  D:  02/17/2009  T:  02/17/2009  Job:  [id number]

## 2011-03-21 NOTE — Op Note (Signed)
   [name], Charlotte Aguirre                             ACCOUNT NO.:  [id number]   MEDICAL RECORD NO.:  [id number]                   PATIENT TYPE:  AMB   LOCATION:  DSC                                  FACILITY:  [facility]   PHYSICIAN:  Artist Pais. Mina Marble, M.D.           DATE OF BIRTH:  17-Feb-1946   DATE OF PROCEDURE:  07/05/2003  DATE OF DISCHARGE:                                 OPERATIVE REPORT   PREOPERATIVE DIAGNOSIS:  Mass, left long finger.   POSTOPERATIVE DIAGNOSIS:  Mass, left long finger.   PROCEDURE:  Excisional biopsy, mass of left long finger.   SURGEON:  Artist Pais. Mina Marble, M.D.   ANESTHESIA:  General.   TOURNIQUET TIME:  20 minutes.   COMPLICATIONS:  None.   DRAINS:  None.   DESCRIPTION OF PROCEDURE:  With informed consent, the patient was taken to  the operating room and with the induction of adequate general anesthesia,  the left upper extremity was prepped and draped in the usual sterile  fashion.  Esmarch was used to exsanguinate the limb and the tourniquet was  then placed on 250 mmHg.  At this point in time, a small incision was made  in the MP flexion crease of the left long finger approximately 1 cm in  length and the incision was taken down through the skin and subcutaneous  tissues.  The ulnar and neurovascular bundle was identified and retracted  and a small mass was removed off the sheath of the flexor tendon.  This was  sent for pathologic diagnosis.  The wound was thoroughly irrigated and  closed with 5-0 nylon.  A sterile dressing with 4 x 4's and an Ace wrap was  applied.  The patient tolerated the procedure well and went to the recovery  room in a stable condition.                                               Artist Pais Mina Marble, M.D.    MAW/MEDQ  D:  07/05/2003  T:  07/05/2003  Job:  [id number]

## 2011-04-16 ENCOUNTER — Other Ambulatory Visit: Payer: Self-pay | Admitting: Physical Medicine and Rehabilitation

## 2011-04-16 DIAGNOSIS — M545 Low back pain, unspecified: Secondary | ICD-10-CM

## 2011-04-24 ENCOUNTER — Ambulatory Visit
Admission: RE | Admit: 2011-04-24 | Discharge: 2011-04-24 | Disposition: A | Discharge status: Home / Self Care | Payer: No Typology Code available for payment source | Source: Ambulatory Visit | Attending: Physical Medicine and Rehabilitation | Admitting: Physical Medicine and Rehabilitation

## 2011-04-24 DIAGNOSIS — M545 Low back pain, unspecified: Secondary | ICD-10-CM

## 2011-12-24 ENCOUNTER — Other Ambulatory Visit (HOSPITAL_COMMUNITY): Payer: Self-pay | Admitting: Internal Medicine

## 2011-12-24 DIAGNOSIS — Z139 Encounter for screening, unspecified: Secondary | ICD-10-CM

## 2012-01-01 ENCOUNTER — Ambulatory Visit (HOSPITAL_COMMUNITY)
Admission: RE | Admit: 2012-01-01 | Discharge: 2012-01-01 | Disposition: A | Discharge status: Home / Self Care | Payer: Medicare Other | Source: Ambulatory Visit | Attending: Internal Medicine | Admitting: Internal Medicine

## 2012-01-01 DIAGNOSIS — Z1231 Encounter for screening mammogram for malignant neoplasm of breast: Secondary | ICD-10-CM | POA: Insufficient documentation

## 2012-01-01 DIAGNOSIS — Z139 Encounter for screening, unspecified: Secondary | ICD-10-CM

## 2012-12-09 ENCOUNTER — Other Ambulatory Visit: Payer: Self-pay | Admitting: Internal Medicine

## 2012-12-09 DIAGNOSIS — R209 Unspecified disturbances of skin sensation: Secondary | ICD-10-CM

## 2012-12-09 DIAGNOSIS — IMO0002 Reserved for concepts with insufficient information to code with codable children: Secondary | ICD-10-CM

## 2012-12-10 ENCOUNTER — Ambulatory Visit
Admission: RE | Admit: 2012-12-10 | Discharge: 2012-12-10 | Disposition: A | Discharge status: Home / Self Care | Payer: Medicare Other | Source: Ambulatory Visit | Attending: Internal Medicine | Admitting: Internal Medicine

## 2012-12-10 DIAGNOSIS — IMO0002 Reserved for concepts with insufficient information to code with codable children: Secondary | ICD-10-CM

## 2012-12-10 DIAGNOSIS — R209 Unspecified disturbances of skin sensation: Secondary | ICD-10-CM

## 2013-03-15 ENCOUNTER — Other Ambulatory Visit: Payer: Self-pay | Admitting: Orthopedic Surgery

## 2013-03-15 DIAGNOSIS — M25532 Pain in left wrist: Secondary | ICD-10-CM

## 2013-03-19 ENCOUNTER — Ambulatory Visit
Admission: RE | Admit: 2013-03-19 | Discharge: 2013-03-19 | Disposition: A | Discharge status: Home / Self Care | Payer: Medicare Other | Source: Ambulatory Visit | Attending: Orthopedic Surgery | Admitting: Orthopedic Surgery

## 2013-03-19 DIAGNOSIS — M25532 Pain in left wrist: Secondary | ICD-10-CM

## 2013-12-12 ENCOUNTER — Encounter (INDEPENDENT_AMBULATORY_CARE_PROVIDER_SITE_OTHER): Payer: Self-pay

## 2013-12-12 ENCOUNTER — Ambulatory Visit (INDEPENDENT_AMBULATORY_CARE_PROVIDER_SITE_OTHER): Payer: Medicare Other | Admitting: Family Medicine

## 2013-12-12 ENCOUNTER — Encounter: Payer: Self-pay | Admitting: Family Medicine

## 2013-12-12 VITALS — BP 133/74 | HR 79 | Temp 98.7°F | Wt 198.8 lb

## 2013-12-12 DIAGNOSIS — M1990 Unspecified osteoarthritis, unspecified site: Secondary | ICD-10-CM

## 2013-12-12 DIAGNOSIS — R519 Headache, unspecified: Secondary | ICD-10-CM

## 2013-12-12 DIAGNOSIS — Z8711 Personal history of peptic ulcer disease: Secondary | ICD-10-CM

## 2013-12-12 DIAGNOSIS — R51 Headache: Secondary | ICD-10-CM

## 2013-12-12 DIAGNOSIS — R200 Anesthesia of skin: Secondary | ICD-10-CM

## 2013-12-12 DIAGNOSIS — G609 Hereditary and idiopathic neuropathy, unspecified: Secondary | ICD-10-CM

## 2013-12-12 DIAGNOSIS — R002 Palpitations: Secondary | ICD-10-CM

## 2013-12-12 DIAGNOSIS — R413 Other amnesia: Secondary | ICD-10-CM

## 2013-12-12 DIAGNOSIS — IMO0001 Reserved for inherently not codable concepts without codable children: Secondary | ICD-10-CM

## 2013-12-12 DIAGNOSIS — I10 Essential (primary) hypertension: Secondary | ICD-10-CM

## 2013-12-12 DIAGNOSIS — R209 Unspecified disturbances of skin sensation: Secondary | ICD-10-CM

## 2013-12-12 DIAGNOSIS — E785 Hyperlipidemia, unspecified: Secondary | ICD-10-CM

## 2013-12-12 DIAGNOSIS — K589 Irritable bowel syndrome without diarrhea: Secondary | ICD-10-CM

## 2013-12-12 DIAGNOSIS — K219 Gastro-esophageal reflux disease without esophagitis: Secondary | ICD-10-CM

## 2013-12-12 LAB — POCT CBC
Granulocyte percent: 62.9 %G (ref 37–80)
HCT, POC: 42.7 % (ref 37.7–47.9)
Hemoglobin: 13.1 g/dL (ref 12.2–16.2)
Lymph, poc: 2.6 (ref 0.6–3.4)
MCH, POC: 25 pg — AB (ref 27–31.2)
MCHC: 30.7 g/dL — AB (ref 31.8–35.4)
MCV: 81.3 fL (ref 80–97)
MPV: 9.3 fL (ref 0–99.8)
POC Granulocyte: 4.7 (ref 2–6.9)
POC LYMPH PERCENT: 35.4 %L (ref 10–50)
Platelet Count, POC: 219 10*3/uL (ref 142–424)
RBC: 5.3 M/uL (ref 4.04–5.48)
RDW, POC: 15.1 %
WBC: 7.4 10*3/uL (ref 4.6–10.2)

## 2013-12-12 NOTE — Progress Notes (Signed)
Patient ID: Charlotte Aguirre, female   DOB: 09-26-1946, 68 y.o.   MRN: [id number] SUBJECTIVE: CC: Chief Complaint  Patient presents with  . Acute Visit    c/o pains in head and feels like side of nose not completely open. states been seeing Dr Delphina Cahill in [place]     HPI: Has had headache all her life. Has been having headache in the left side of the head and also difficulty to take a breath out of the left nostril. Blurred vision, has to waer glasses. Annual eye exam. Has h/o cataracts. Never had a brain. Used to see Dr Delphina Cahill. Now moved over to [facility] Has noticed memory not as good.   Past Medical History  Diagnosis Date  . GERD (gastroesophageal reflux disease)   . Fibromyalgia   . Peripheral neuropathy   . Arthritis     thumb and fingers  . Hypertension   . Hyperlipidemia   . IBS (irritable bowel syndrome)    No past surgical history on file. History   Social History  . Marital Status: Married    Spouse Name: N/A    Number of Children: N/A  . Years of Education: N/A   Occupational History  . Not on file.   Social History Main Topics  . Smoking status: Former Smoker    Types: Cigarettes  . Smokeless tobacco: Not on file  . Alcohol Use: No  . Drug Use: No  . Sexual Activity: Not on file   Other Topics Concern  . Not on file   Social History Narrative  . No narrative on file   Family History  Problem Relation Age of Onset  . Dementia Brother    No current outpatient prescriptions on file prior to visit.   No current facility-administered medications on file prior to visit.   Allergies  Allergen Reactions  . Aspirin     REACTION: unknown reaction    There is no immunization history on file for this patient. Prior to Admission medications   Medication Sig Start Date End Date Taking? Authorizing Provider  aspirin 81 MG chewable tablet Chew by mouth daily.   Yes Historical Provider, MD  Calcium Carb-Cholecalciferol (CALCIUM 1000 + D PO) Take by  mouth.   Yes Historical Provider, MD  cholecalciferol (VITAMIN D) 400 UNITS TABS tablet Take 400 Units by mouth.   Yes Historical Provider, MD  Cholecalciferol (VITAMIN D-3 PO) Take 1 tablet by mouth daily.   Yes Historical Provider, MD  gabapentin (NEURONTIN) 300 MG capsule Take 300 mg by mouth 3 (three) times daily.   Yes Historical Provider, MD  hydrochlorothiazide (HYDRODIURIL) 25 MG tablet Take 25 mg by mouth daily.   Yes Historical Provider, MD  Omega-3 Fatty Acids (FISH OIL) 500 MG CAPS Take 2 capsules by mouth daily.   Yes Historical Provider, MD  omeprazole (PRILOSEC) 20 MG capsule Take 20 mg by mouth daily.   Yes Historical Provider, MD  simvastatin (ZOCOR) 40 MG tablet Take 40 mg by mouth daily.   Yes Historical Provider, MD     ROS: As above in the HPI. All other systems are stable or negative.  OBJECTIVE: APPEARANCE:  Patient in no acute distress.The patient appeared well nourished and normally developed. Acyanotic. Waist: VITAL SIGNS:BP 133/74  Pulse 79  Temp(Src) 98.7 F (37.1 C) (Oral)  Wt 198 lb 12.8 oz (90.175 kg) WF Temporal arteries not tender.  SKIN: warm and  Dry without overt rashes, tattoos and scars  HEAD  and Neck: without JVD, Head and scalp: normal Eyes:No scleral icterus. Fundi normal, eye movements normal. Ears: Auricle normal, canal normal, Tympanic membranes normal, insufflation normal. Nose: normal Throat: normal Neck & thyroid: normal  CHEST & LUNGS: Chest wall: normal Lungs: Clear  CVS: Reveals the PMI to be normally located. Regular rhythm, First and Second Heart sounds are normal,  absence of murmurs, rubs or gallops. Peripheral vasculature: Radial pulses: normal Dorsal pedis pulses: normal Posterior pulses: normal  ABDOMEN:  Appearance: obese Benign, no organomegaly, no masses, no Abdominal Aortic enlargement. No Guarding , no rebound. No Bruits. Bowel sounds: normal  RECTAL: N/A GU: N/A  EXTREMETIES:  nonedematous.  MUSCULOSKELETAL:  Spine: normal Joints: intact  NEUROLOGIC: oriented to time,place and person; nonfocal. Strength is normal Sensory is abnormal: left face subjectively numb. Reflexes are normal Cranial Nerves are normal.   MMSE 27/30  ASSESSMENT: Increased severity of headaches - Plan: MR Brain W Wo Contrast  Left facial numbness - Plan: CMP14+EGFR, Vitamin B12, Folate, TSH, POCT CBC, MR Brain W Wo Contrast  Hypertension  DYSLIPIDEMIA  PERIPHERAL NEUROPATHY - Plan: CMP14+EGFR, Vitamin B12, Folate, TSH, POCT CBC  PEPTIC ULCER DISEASE, HX OF  Palpitations  OSTEOARTHRITIS  Irritable bowel syndrome  GERD (gastroesophageal reflux disease)  FIBROMYALGIA - Plan: Vit D  25 hydroxy (rtn osteoporosis monitoring)  Esophageal reflux  Memory impairment  PLAN: Initiate work up for her headaches and neurologic symptoms.  Orders Placed This Encounter  Procedures  . MR Brain W Wo Contrast    Standing Status: Future     Number of Occurrences:      Standing Expiration Date: 02/10/2015    Order Specific Question:  Reason for Exam (SYMPTOM  OR DIAGNOSIS REQUIRED)    Answer:  new type of headache unilateral left sided with facial numbness of the left cheek. r/o intracranial lesion    Order Specific Question:  Preferred imaging location?    Answer:  [id number]    Order Specific Question:  Does the patient have a pacemaker, internal devices, implants, aneury    Answer:  No  . CMP14+EGFR  . Vitamin B12  . Folate  . TSH  . Vit D  25 hydroxy (rtn osteoporosis monitoring)  . POCT CBC   Meds ordered this encounter  Medications  . simvastatin (ZOCOR) 40 MG tablet    Sig: Take 40 mg by mouth daily.  Marland Kitchen omeprazole (PRILOSEC) 20 MG capsule    Sig: Take 20 mg by mouth daily.  Marland Kitchen gabapentin (NEURONTIN) 300 MG capsule    Sig: Take 300 mg by mouth 3 (three) times daily.  . hydrochlorothiazide (HYDRODIURIL) 25 MG tablet    Sig: Take 25 mg by mouth daily.  Marland Kitchen  aspirin 81 MG chewable tablet    Sig: Chew by mouth daily.  . Calcium Carb-Cholecalciferol (CALCIUM 1000 + D PO)    Sig: Take by mouth.  . cholecalciferol (VITAMIN D) 400 UNITS TABS tablet    Sig: Take 400 Units by mouth.  . Cholecalciferol (VITAMIN D-3 PO)    Sig: Take 1 tablet by mouth daily.  . Omega-3 Fatty Acids (FISH OIL) 500 MG CAPS    Sig: Take 2 capsules by mouth daily.   There are no discontinued medications. Return in about 3 weeks (around 01/02/2014) for CPE, Recheck medical problems.  Elveta Rape P. Jacelyn Grip, M.D.

## 2013-12-13 LAB — FOLATE: Folate: 13.9 ng/mL (ref >3.0)

## 2013-12-13 LAB — TSH: TSH: 2.19 u[IU]/mL (ref 0.450–4.500)

## 2013-12-13 LAB — CMP14+EGFR
ALT: 17 IU/L (ref 0–32)
AST: 22 IU/L (ref 0–40)
Albumin/Globulin Ratio: 1.8 (ref 1.1–2.5)
Albumin: 4.2 g/dL (ref 3.6–4.8)
Alkaline Phosphatase: 117 IU/L (ref 39–117)
BUN/Creatinine Ratio: 15 (ref 11–26)
BUN: 11 mg/dL (ref 8–27)
CO2: 27 mmol/L (ref 18–29)
Calcium: 9.6 mg/dL (ref 8.7–10.3)
Chloride: 100 mmol/L (ref 97–108)
Creatinine, Ser: 0.72 mg/dL (ref 0.57–1.00)
GFR calc Af Amer: 99 mL/min/{1.73_m2} (ref >59)
GFR calc non Af Amer: 86 mL/min/{1.73_m2} (ref >59)
Globulin, Total: 2.4 g/dL (ref 1.5–4.5)
Glucose: 92 mg/dL (ref 65–99)
Potassium: 4 mmol/L (ref 3.5–5.2)
Sodium: 144 mmol/L (ref 134–144)
Total Bilirubin: 0.3 mg/dL (ref 0.0–1.2)
Total Protein: 6.6 g/dL (ref 6.0–8.5)

## 2013-12-13 LAB — VITAMIN D 25 HYDROXY (VIT D DEFICIENCY, FRACTURES): Vit D, 25-Hydroxy: 26.3 ng/mL — ABNORMAL LOW (ref 30.0–100.0)

## 2013-12-13 LAB — VITAMIN B12: Vitamin B-12: 321 pg/mL (ref 211–946)

## 2013-12-14 NOTE — Progress Notes (Signed)
Quick Note:  Call Patient Labs that are abnormal: Vit d is low The rest are at goal or normal  Recommendations: Double her Vit D to 2 tablets daily. Remember to bring a complete list or bring the actual medications at her CPE so we can accurately document her medications.   ______

## 2013-12-15 ENCOUNTER — Encounter: Payer: Self-pay | Admitting: Family Medicine

## 2013-12-20 ENCOUNTER — Other Ambulatory Visit (HOSPITAL_COMMUNITY): Payer: Medicare Other

## 2013-12-22 ENCOUNTER — Ambulatory Visit
Admission: RE | Admit: 2013-12-22 | Discharge: 2013-12-22 | Disposition: A | Discharge status: Home / Self Care | Payer: Medicare Other | Source: Ambulatory Visit | Attending: Family Medicine | Admitting: Family Medicine

## 2013-12-22 DIAGNOSIS — R200 Anesthesia of skin: Secondary | ICD-10-CM

## 2013-12-22 DIAGNOSIS — R51 Headache: Principal | ICD-10-CM

## 2013-12-22 DIAGNOSIS — R519 Headache, unspecified: Secondary | ICD-10-CM

## 2013-12-22 MED ORDER — GADOBENATE DIMEGLUMINE 529 MG/ML IV SOLN
18.0000 mL | Freq: Once | INTRAVENOUS | Status: AC | PRN
  Administered 2013-12-22: 18 mL via INTRAVENOUS

## 2013-12-26 ENCOUNTER — Other Ambulatory Visit: Payer: Medicare Other

## 2014-01-03 ENCOUNTER — Ambulatory Visit (INDEPENDENT_AMBULATORY_CARE_PROVIDER_SITE_OTHER): Payer: Medicare Other | Admitting: Family Medicine

## 2014-01-03 ENCOUNTER — Encounter: Payer: Self-pay | Admitting: Family Medicine

## 2014-01-03 ENCOUNTER — Inpatient Hospital Stay
Admission: RE | Admit: 2014-01-03 | Discharge: 2014-01-03 | Disposition: A | Discharge status: Home / Self Care | Payer: Self-pay | Source: Ambulatory Visit | Attending: Family Medicine | Admitting: Family Medicine

## 2014-01-03 VITALS — BP 127/70 | HR 76 | Temp 98.7°F | Ht 62.0 in | Wt 199.4 lb

## 2014-01-03 DIAGNOSIS — E785 Hyperlipidemia, unspecified: Secondary | ICD-10-CM

## 2014-01-03 DIAGNOSIS — Z8711 Personal history of peptic ulcer disease: Secondary | ICD-10-CM

## 2014-01-03 DIAGNOSIS — G500 Trigeminal neuralgia: Secondary | ICD-10-CM

## 2014-01-03 DIAGNOSIS — Z119 Encounter for screening for infectious and parasitic diseases, unspecified: Secondary | ICD-10-CM

## 2014-01-03 DIAGNOSIS — J309 Allergic rhinitis, unspecified: Secondary | ICD-10-CM

## 2014-01-03 DIAGNOSIS — R51 Headache: Secondary | ICD-10-CM

## 2014-01-03 DIAGNOSIS — K589 Irritable bowel syndrome without diarrhea: Secondary | ICD-10-CM

## 2014-01-03 DIAGNOSIS — Z780 Asymptomatic menopausal state: Secondary | ICD-10-CM

## 2014-01-03 DIAGNOSIS — Z1239 Encounter for other screening for malignant neoplasm of breast: Secondary | ICD-10-CM

## 2014-01-03 DIAGNOSIS — M1990 Unspecified osteoarthritis, unspecified site: Secondary | ICD-10-CM

## 2014-01-03 DIAGNOSIS — G609 Hereditary and idiopathic neuropathy, unspecified: Secondary | ICD-10-CM

## 2014-01-03 DIAGNOSIS — R413 Other amnesia: Secondary | ICD-10-CM

## 2014-01-03 DIAGNOSIS — I10 Essential (primary) hypertension: Secondary | ICD-10-CM

## 2014-01-03 DIAGNOSIS — Z1211 Encounter for screening for malignant neoplasm of colon: Secondary | ICD-10-CM

## 2014-01-03 DIAGNOSIS — IMO0001 Reserved for inherently not codable concepts without codable children: Secondary | ICD-10-CM

## 2014-01-03 DIAGNOSIS — Z0000 Encounter for general adult medical examination without abnormal findings: Secondary | ICD-10-CM | POA: Insufficient documentation

## 2014-01-03 DIAGNOSIS — K219 Gastro-esophageal reflux disease without esophagitis: Secondary | ICD-10-CM

## 2014-01-03 MED ORDER — HYDROCHLOROTHIAZIDE 25 MG PO TABS: 25.0000 mg | ORAL_TABLET | Freq: Every day | ORAL | Status: DC

## 2014-01-03 MED ORDER — CARBAMAZEPINE 200 MG PO TABS: 200.0000 mg | ORAL_TABLET | Freq: Every day | ORAL | Status: DC

## 2014-01-03 NOTE — Patient Instructions (Addendum)
HEALTH MAINTENANCE Immunizations: Tetanus-Diphtheria Booster CBU:LAGTX Pertusis Booster MIW:OEHOZ Flu Shot Due: every Fall Pneumonia Vaccine: usually at 70 years of age unless there are certain risk situations. Herpes Zoster/Shingles Vaccine due: usually at 70 years of age HPV YYQ:MGNO age 25 to 91 years in males and females.  Healthy Life Habits: Exercise Goal: 5-6 days/week; start gradually(ie 30 minutes/3days per week) Nutrition: Balanced healthy meals including Vegetables and Fruits. Consider  Reading the following books: 1) Eat to Live by Dr Diona Browner; 2) Prevent and Reverse Heart Disease by Dr Karl Luke.  Vitamins:multivitamin okay Aspirin:81 mg daily Stop Tobacco Use: avoid Seat Belt Use:+++ recommended Sunscreen Use:+++ recommended Osteoporosis Prevention: 1) Exercise 2) Calcium/Vitamin D requirements:he [facility] [place] recommends:    Calcium:  800 mg/day for children 5-46 years of age          54 mg/day for children 35-79 years of age          68 mg/day for adults 26-69 years of age          9 mg/day for everyone more than 70 years of age     Vitamin D: 2000 IU per day as recommended  Recommended Screening Tests: Colon Cancer Screening:due Blood work: has been done Cholesterol Screening:   Next week fasting     HIV:       n/a             Hepatitis C(people born 1945-1965):next week  Mammogram:scheduled DEXA/Bone Density:scheduled GYN Exam: has hysterectomy Monthly Self Breast Exam:+++   Eye Exam: every 1 to 2 years recommended Dental Health: at least every 6 months  Others:    Living Will/Healthcare Power of Attorney: should have this in order with your personal estate planning  Remember to eat high quality salmon or tuna: 4 ounces three servings a week. Also omega-3 fish oil 2 caps twice/ day. Ongoing studies on brain improvement may be possible.

## 2014-01-03 NOTE — Progress Notes (Signed)
Patient ID: Charlotte Aguirre, female   DOB: [birth year], 68 y.o.   MRN: [id number] SUBJECTIVE: CC: Chief Complaint  Patient presents with  . Annual Exam    cholesterol, B/P    HPI: Annual physical. Has had headache all her life. MRI was negative for acute process. The facial numbness went away and the headache is less but still daily.  Pain in the left parietal area. Sharp and feels like shocks across the left face.  Past Medical History  Diagnosis Date  . GERD (gastroesophageal reflux disease)   . Fibromyalgia   . Peripheral neuropathy   . Arthritis     thumb and fingers  . Hypertension   . Hyperlipidemia   . IBS (irritable bowel syndrome)    Past Surgical History  Procedure Laterality Date  . Abdominal hysterectomy    . Cholecystectomy     History   Social History  . Marital Status: Married    Spouse Name: N/A    Number of Children: N/A  . Years of Education: N/A   Occupational History  . Not on file.   Social History Main Topics  . Smoking status: Former Smoker    Types: Cigarettes  . Smokeless tobacco: Not on file  . Alcohol Use: No  . Drug Use: No  . Sexual Activity: Not on file   Other Topics Concern  . Not on file   Social History Narrative  . No narrative on file   Family History  Problem Relation Age of Onset  . Dementia Brother    Current Outpatient Prescriptions on File Prior to Visit  Medication Sig Dispense Refill  . aspirin 81 MG chewable tablet Chew by mouth daily.      . Calcium Carb-Cholecalciferol (CALCIUM 1000 + D PO) Take by mouth.      . cholecalciferol (VITAMIN D) 400 UNITS TABS tablet Take 400 Units by mouth.      . Cholecalciferol (VITAMIN D-3 PO) Take 1 tablet by mouth daily.      Marland Kitchen gabapentin (NEURONTIN) 300 MG capsule Take 300 mg by mouth 3 (three) times daily.      . Omega-3 Fatty Acids (FISH OIL) 500 MG CAPS Take 2 capsules by mouth daily.      Marland Kitchen omeprazole (PRILOSEC) 20 MG capsule Take 20 mg by mouth daily.      . simvastatin  (ZOCOR) 40 MG tablet Take 40 mg by mouth daily.       No current facility-administered medications on file prior to visit.   Allergies  Allergen Reactions  . Aspirin     REACTION: unknown reaction    There is no immunization history on file for this patient. Prior to Admission medications   Medication Sig Start Date End Date Taking? Authorizing Provider  aspirin 81 MG chewable tablet Chew by mouth daily.   Yes Historical Provider, MD  Calcium Carb-Cholecalciferol (CALCIUM 1000 + D PO) Take by mouth.   Yes Historical Provider, MD  cholecalciferol (VITAMIN D) 400 UNITS TABS tablet Take 400 Units by mouth.   Yes Historical Provider, MD  Cholecalciferol (VITAMIN D-3 PO) Take 1 tablet by mouth daily.   Yes Historical Provider, MD  gabapentin (NEURONTIN) 300 MG capsule Take 300 mg by mouth 3 (three) times daily.   Yes Historical Provider, MD  hydrochlorothiazide (HYDRODIURIL) 25 MG tablet Take 25 mg by mouth daily.   Yes Historical Provider, MD  Omega-3 Fatty Acids (FISH OIL) 500 MG CAPS Take 2 capsules by mouth daily.  Yes Historical Provider, MD  omeprazole (PRILOSEC) 20 MG capsule Take 20 mg by mouth daily.   Yes Historical Provider, MD  simvastatin (ZOCOR) 40 MG tablet Take 40 mg by mouth daily.   Yes Historical Provider, MD     ROS: As above in the HPI. All other systems are stable or negative.  OBJECTIVE: APPEARANCE:  Patient in no acute distress.The patient appeared well nourished and normally developed. Acyanotic. Waist: VITAL SIGNS:BP 127/70  Pulse 76  Temp(Src) 98.7 F (37.1 C) (Oral)  Ht _0  (1.575 m)  Wt 199 lb 6.4 oz (90.447 kg)  BMI 36.46 kg/m2  WF Obese SKIN: warm and  Dry without overt rashes, tattoos and scars  HEAD and Neck: without JVD, Head and scalp: normal Eyes:No scleral icterus. Fundi normal, eye movements normal. Ears: Auricle normal, canal normal, Tympanic membranes normal, insufflation normal. Nose: normal Throat: normal Neck & thyroid:  normal  CHEST & LUNGS: Chest wall: normal Lungs: Clear  CVS: Reveals the PMI to be normally located. Regular rhythm, First and Second Heart sounds are normal,  absence of murmurs, rubs or gallops. Peripheral vasculature: Radial pulses: normal Dorsal pedis pulses: normal Posterior pulses: normal  ABDOMEN:  Appearance: Obese Benign, no organomegaly, no masses, no Abdominal Aortic enlargement. No Guarding , no rebound. No Bruits. Bowel sounds: normal  RECTAL: N/A GU: N/A  EXTREMETIES: nonedematous.  MUSCULOSKELETAL:  Spine: normal Joints: intact  NEUROLOGIC: oriented to time,place and person; nonfocal. Strength is normal Sensory is normal Reflexes are normal Cranial Nerves are normal.   Results for orders placed in visit on 12/12/13  CMP14+EGFR      Result Value Ref Range   Glucose 92  65 - 99 mg/dL   BUN 11  8 - 27 mg/dL   Creatinine, Ser 0.72  0.57 - 1.00 mg/dL   GFR calc non Af Amer 86  >59 mL/min/1.73   GFR calc Af Amer 99  >59 mL/min/1.73   BUN/Creatinine Ratio 15  11 - 26   Sodium 144  134 - 144 mmol/L   Potassium 4.0  3.5 - 5.2 mmol/L   Chloride 100  97 - 108 mmol/L   CO2 27  18 - 29 mmol/L   Calcium 9.6  8.7 - 10.3 mg/dL   Total Protein 6.6  6.0 - 8.5 g/dL   Albumin 4.2  3.6 - 4.8 g/dL   Globulin, Total 2.4  1.5 - 4.5 g/dL   Albumin/Globulin Ratio 1.8  1.1 - 2.5   Total Bilirubin 0.3  0.0 - 1.2 mg/dL   Alkaline Phosphatase 117  39 - 117 IU/L   AST 22  0 - 40 IU/L   ALT 17  0 - 32 IU/L  VITAMIN B12      Result Value Ref Range   Vitamin B-12 321  211 - 946 pg/mL  FOLATE      Result Value Ref Range   Folate 13.9  >3.0 ng/mL  TSH      Result Value Ref Range   TSH 2.190  0.450 - 4.500 uIU/mL  VITAMIN D 25 HYDROXY      Result Value Ref Range   Vit D, 25-Hydroxy 26.3 (*) 30.0 - 100.0 ng/mL  POCT CBC      Result Value Ref Range   WBC 7.4  4.6 - 10.2 K/uL   Lymph, poc 2.6  0.6 - 3.4   POC LYMPH PERCENT 35.4  10 - 50 %L   POC Granulocyte 4.7  2 -  6.9  Granulocyte percent 62.9  37 - 80 %G   RBC 5.3  4.04 - 5.48 M/uL   Hemoglobin 13.1  12.2 - 16.2 g/dL   HCT, POC 42.7  37.7 - 47.9 %   MCV 81.3  80 - 97 fL   MCH, POC 25.0 (*) 27 - 31.2 pg   MCHC 30.7 (*) 31.8 - 35.4 g/dL   RDW, POC 15.1     Platelet Count, POC 219.0  142 - 424 K/uL   MPV 9.3  0 - 99.8 fL    ASSESSMENT: Annual physical exam  Memory impairment  Hypertension - Plan: hydrochlorothiazide (HYDRODIURIL) 25 MG tablet  DYSLIPIDEMIA - Plan: Lipid panel  PERIPHERAL NEUROPATHY  PEPTIC ULCER DISEASE, HX OF  OSTEOARTHRITIS  Irritable bowel syndrome  GERD (gastroesophageal reflux disease)  FIBROMYALGIA  ALLERGIC RHINITIS  Screening for breast cancer - Plan: MM Digital Screening  Postmenopausal - Plan: DG Bone Density  Screening examination for infectious disease - Plan: Hepatitis C antibody  Headache(784.0) - Plan: carbamazepine (TEGRETOL) 200 MG tablet  TN (trigeminal neuralgia) - Plan: carbamazepine (TEGRETOL) 200 MG tablet  Screen for colon cancer - Plan: Ambulatory referral to Gastroenterology Suspect that the headaches is Trigeminal Neuralgia   PLAN:      HEALTH MAINTENANCE Immunizations: Tetanus-Diphtheria Booster ONG:EXBMW Pertusis Booster UXL:KGMWN Flu Shot Due: every Fall Pneumonia Vaccine: usually at 70 years of age unless there are certain risk situations. Herpes Zoster/Shingles Vaccine due: usually at 70 years of age HPV UUV:OZDG age 71 to 62 years in males and females.  Healthy Life Habits: Exercise Goal: 5-6 days/week; start gradually(ie 30 minutes/3days per week) Nutrition: Balanced healthy meals including Vegetables and Fruits. Consider  Reading the following books: 1) Eat to Live by Dr Diona Browner; 2) Prevent and Reverse Heart Disease by Dr Karl Luke.  Vitamins:multivitamin okay Aspirin:81 mg daily Stop Tobacco Use: avoid Seat Belt Use:+++ recommended Sunscreen Use:+++ recommended Osteoporosis Prevention: 1)  Exercise 2) Calcium/Vitamin D requirements:[facility] [place] recommends:    Calcium:  800 mg/day for children 62-71 years of age          20 mg/day for children 68-4 years of age          65 mg/day for adults 46-73 years of age          56 mg/day for everyone more than 70 years of age     Vitamin D: 2000 IU per day as recommended  Recommended Screening Tests: Colon Cancer Screening:due Blood work: has been done Cholesterol Screening:   Next week fasting     HIV:       n/a             Hepatitis C(people born 1945-1965):next week  Mammogram:scheduled DEXA/Bone Density:scheduled GYN Exam: has hysterectomy Monthly Self Breast Exam:+++   Eye Exam: every 1 to 2 years recommended Dental Health: at least every 6 months  Others:    Living Will/Healthcare Power of Attorney: should have this in order with your personal estate planning  Remember to eat high quality salmon or tuna: 4 ounces three servings a week. Also omega-3 fish oil 2 caps twice/ day. Ongoing studies on brain improvement may be possible.   Patient declines aricept and namenda. Orders Placed This Encounter  Procedures  . MM Digital Screening    Standing Status: Future     Number of Occurrences:      Standing Expiration Date: 03/06/2015    Order Specific Question:  Reason  for Exam (SYMPTOM  OR DIAGNOSIS REQUIRED)    Answer:  past due for screening    Order Specific Question:  Preferred imaging location?    Answer:  External  . DG Bone Density    Standing Status: Future     Number of Occurrences: 1     Standing Expiration Date: 03/06/2015    Order Specific Question:  Reason for Exam (SYMPTOM  OR DIAGNOSIS REQUIRED)    Answer:  last DEXA > 5 years ago    Order Specific Question:  Preferred imaging location?    Answer:  Internal  . Hepatitis C antibody    Standing Status: Future     Number of Occurrences:      Standing Expiration Date: 01/04/2015  . Lipid panel     Standing Status: Future     Number of Occurrences:      Standing Expiration Date: 01/04/2015    Order Specific Question:  Has the patient fasted?    Answer:  Yes  . Ambulatory referral to Gastroenterology    Referral Priority:  Routine    Referral Type:  Consultation    Referral Reason:  Specialty Services Required    Requested Specialty:  Gastroenterology    Number of Visits Requested:  1   Meds ordered this encounter  Medications  . hydrochlorothiazide (HYDRODIURIL) 25 MG tablet    Sig: Take 1 tablet (25 mg total) by mouth daily.    Dispense:  30 tablet    Refill:  5  . carbamazepine (TEGRETOL) 200 MG tablet    Sig: Take 1 tablet (200 mg total) by mouth daily.    Dispense:  30 tablet    Refill:  3   Medications Discontinued During This Encounter  Medication Reason  . hydrochlorothiazide (HYDRODIURIL) 25 MG tablet Change in therapy   Return in about 4 weeks (around 01/31/2014) for Recheck medical problems, and headache recheck.Dub Mikes P. Jacelyn Grip, M.D.

## 2014-01-04 ENCOUNTER — Encounter (INDEPENDENT_AMBULATORY_CARE_PROVIDER_SITE_OTHER): Payer: Self-pay | Admitting: *Deleted

## 2014-01-09 ENCOUNTER — Telehealth: Payer: Self-pay | Admitting: Family Medicine

## 2014-01-09 NOTE — Telephone Encounter (Signed)
Pt states having dizzy spells since taking epitol. States she took it yest and hours later got "dizzy"  She is afraid to take while driving.  Please Advise

## 2014-01-11 NOTE — Telephone Encounter (Signed)
Called patient and I got answering machine. Did not leave a message. Tell her to cut the dose to a half a tablet prior to bedtime to see if that is better. If not then discontinue and follow up with me. Timmy Cleverly P. Jacelyn Grip, M.D.

## 2014-01-12 NOTE — Telephone Encounter (Signed)
Spoke with pt with the following directions and verbalized understanding

## 2014-01-13 ENCOUNTER — Other Ambulatory Visit (INDEPENDENT_AMBULATORY_CARE_PROVIDER_SITE_OTHER): Payer: Self-pay | Admitting: *Deleted

## 2014-01-13 DIAGNOSIS — Z1211 Encounter for screening for malignant neoplasm of colon: Secondary | ICD-10-CM

## 2014-01-16 ENCOUNTER — Telehealth (INDEPENDENT_AMBULATORY_CARE_PROVIDER_SITE_OTHER): Payer: Self-pay | Admitting: *Deleted

## 2014-01-16 ENCOUNTER — Encounter (INDEPENDENT_AMBULATORY_CARE_PROVIDER_SITE_OTHER): Payer: Self-pay | Admitting: *Deleted

## 2014-01-16 DIAGNOSIS — Z1211 Encounter for screening for malignant neoplasm of colon: Secondary | ICD-10-CM

## 2014-01-16 NOTE — Telephone Encounter (Signed)
Patient needs movi prep 

## 2014-01-17 MED ORDER — PEG-KCL-NACL-NASULF-NA ASC-C 100 G PO SOLR: 1.0000 | Freq: Once | ORAL | Status: DC

## 2014-02-02 ENCOUNTER — Ambulatory Visit (INDEPENDENT_AMBULATORY_CARE_PROVIDER_SITE_OTHER): Payer: Medicare Other | Admitting: Family Medicine

## 2014-02-02 ENCOUNTER — Encounter: Payer: Self-pay | Admitting: Family Medicine

## 2014-02-02 VITALS — BP 119/72 | HR 77 | Temp 98.4°F | Ht 62.0 in | Wt 195.4 lb

## 2014-02-02 DIAGNOSIS — Z119 Encounter for screening for infectious and parasitic diseases, unspecified: Secondary | ICD-10-CM

## 2014-02-02 DIAGNOSIS — G609 Hereditary and idiopathic neuropathy, unspecified: Secondary | ICD-10-CM

## 2014-02-02 DIAGNOSIS — Z23 Encounter for immunization: Secondary | ICD-10-CM

## 2014-02-02 DIAGNOSIS — E559 Vitamin D deficiency, unspecified: Secondary | ICD-10-CM | POA: Insufficient documentation

## 2014-02-02 DIAGNOSIS — IMO0001 Reserved for inherently not codable concepts without codable children: Secondary | ICD-10-CM

## 2014-02-02 DIAGNOSIS — E785 Hyperlipidemia, unspecified: Secondary | ICD-10-CM

## 2014-02-02 DIAGNOSIS — I10 Essential (primary) hypertension: Secondary | ICD-10-CM

## 2014-02-02 DIAGNOSIS — M1990 Unspecified osteoarthritis, unspecified site: Secondary | ICD-10-CM

## 2014-02-02 DIAGNOSIS — K219 Gastro-esophageal reflux disease without esophagitis: Secondary | ICD-10-CM

## 2014-02-02 DIAGNOSIS — K589 Irritable bowel syndrome without diarrhea: Secondary | ICD-10-CM

## 2014-02-02 DIAGNOSIS — G500 Trigeminal neuralgia: Secondary | ICD-10-CM | POA: Insufficient documentation

## 2014-02-02 DIAGNOSIS — R413 Other amnesia: Secondary | ICD-10-CM

## 2014-02-02 NOTE — Patient Instructions (Signed)
Trigeminal Neuralgia Trigeminal neuralgia is a nerve disorder that causes sudden attacks of severe facial pain. It is caused by damage to the trigeminal nerve, a major nerve in the face. It is more common in women and in the elderly, although it can also happen in younger patients. Attacks last from a few seconds to several minutes and can occur from a couple of times per year to several times per day. Trigeminal neuralgia can be a very distressing and disabling condition. Surgery may be needed in very severe cases if medical treatment does not give relief. HOME CARE INSTRUCTIONS   If your caregiver prescribed medication to help prevent attacks, take as directed.  To help prevent attacks:  Chew on the unaffected side of the mouth.  Avoid touching your face.  Avoid blasts of hot or cold air.  Men may wish to grow a beard to avoid having to shave. SEEK IMMEDIATE MEDICAL CARE IF:  Pain is unbearable and your medicine does not help.  You develop new, unexplained symptoms (problems).  You have problems that may be related to a medication you are taking. Document Released: 10/17/2000 Document Revised: 01/12/2012 Document Reviewed: 08/17/2009 [facility] Patient Information 2014 [place], [place].         Dr Paula Libra Recommendations  For nutrition information, I recommend books:  1).Eat to Live by Dr Excell Seltzer. 2).Prevent and Reverse Heart Disease by Dr Karl Luke. 3) Dr Janene Harvey Book:  Program to Reverse Diabetes  Exercise recommendations are:  If unable to walk, then the patient can exercise in a chair 3 times a day. By flapping arms like a bird gently and raising legs outwards to the front.  If ambulatory, the patient can go for walks for 30 minutes 3 times a week. Then increase the intensity and duration as tolerated.  Goal is to try to attain exercise frequency to 5 times a week.  If applicable: Best to perform resistance exercises (machines or weights) 2  days a week and cardio type exercises 3 days per week.

## 2014-02-02 NOTE — Progress Notes (Signed)
Patient ID: Charlotte Aguirre, female   DOB: 1946/07/03, 68 y.o.   MRN: [id number] SUBJECTIVE: CC: Chief Complaint  Patient presents with  . Headache    4 week recheck    HPI: Here for recheck on her headache which was thought to be due to trigeminal neuralgia It is now 7 & better than it has ever been for years and she is appreciative of this. However she could not tolerate the full pill and cut it to 1/2 at night and this is fine without side effects. The pain in the left ear and the left face and in the head is better. Other medical problems are stable.  Past Medical History  Diagnosis Date  . GERD (gastroesophageal reflux disease)   . Fibromyalgia   . Peripheral neuropathy   . Arthritis     thumb and fingers  . Hypertension   . Hyperlipidemia   . IBS (irritable bowel syndrome)    Past Surgical History  Procedure Laterality Date  . Abdominal hysterectomy    . Cholecystectomy     History   Social History  . Marital Status: Married    Spouse Name: N/A    Number of Children: N/A  . Years of Education: N/A   Occupational History  . Not on file.   Social History Main Topics  . Smoking status: Former Smoker    Types: Cigarettes  . Smokeless tobacco: Not on file  . Alcohol Use: No  . Drug Use: No  . Sexual Activity: Not on file   Other Topics Concern  . Not on file   Social History Narrative  . No narrative on file   Family History  Problem Relation Age of Onset  . Dementia Brother    Current Outpatient Prescriptions on File Prior to Visit  Medication Sig Dispense Refill  . aspirin 81 MG chewable tablet Chew by mouth daily.      . Calcium Carb-Cholecalciferol (CALCIUM 1000 + D PO) Take by mouth.      . cholecalciferol (VITAMIN D) 400 UNITS TABS tablet Take 400 Units by mouth.      . Cholecalciferol (VITAMIN D-3 PO) Take 1 tablet by mouth daily.      Marland Kitchen gabapentin (NEURONTIN) 300 MG capsule Take 300 mg by mouth 3 (three) times daily.      . hydrochlorothiazide  (HYDRODIURIL) 25 MG tablet Take 1 tablet (25 mg total) by mouth daily.  30 tablet  5  . Omega-3 Fatty Acids (FISH OIL) 500 MG CAPS Take 2 capsules by mouth daily.      Marland Kitchen omeprazole (PRILOSEC) 20 MG capsule Take 20 mg by mouth 2 (two) times daily before a meal.       . simvastatin (ZOCOR) 40 MG tablet Take 40 mg by mouth daily.      . peg 3350 powder (MOVIPREP) 100 G SOLR Take 1 kit (200 g total) by mouth once.  1 kit  0   No current facility-administered medications on file prior to visit.   Allergies  Allergen Reactions  . Aspirin     Must take EC, prior history of stomach ulcer.   Immunization History  Administered Date(s) Administered  . Pneumococcal Polysaccharide-23 02/02/2014   Prior to Admission medications   Medication Sig Start Date End Date Taking? Authorizing Provider  aspirin 81 MG chewable tablet Chew by mouth daily.   Yes Historical Provider, MD  Calcium Carb-Cholecalciferol (CALCIUM 1000 + D PO) Take by mouth.   Yes Historical Provider, MD  carbamazepine (TEGRETOL) 200 MG tablet Take 1 tablet (200 mg total) by mouth daily. 01/03/14  Yes Vernie Shanks, MD  cholecalciferol (VITAMIN D) 400 UNITS TABS tablet Take 400 Units by mouth.   Yes Historical Provider, MD  Cholecalciferol (VITAMIN D-3 PO) Take 1 tablet by mouth daily.   Yes Historical Provider, MD  gabapentin (NEURONTIN) 300 MG capsule Take 300 mg by mouth 3 (three) times daily.   Yes Historical Provider, MD  hydrochlorothiazide (HYDRODIURIL) 25 MG tablet Take 1 tablet (25 mg total) by mouth daily. 01/03/14  Yes Vernie Shanks, MD  Omega-3 Fatty Acids (FISH OIL) 500 MG CAPS Take 2 capsules by mouth daily.   Yes Historical Provider, MD  omeprazole (PRILOSEC) 20 MG capsule Take 20 mg by mouth 2 (two) times daily before a meal.    Yes Historical Provider, MD  simvastatin (ZOCOR) 40 MG tablet Take 40 mg by mouth daily.   Yes Historical Provider, MD  peg 3350 powder (MOVIPREP) 100 G SOLR Take 1 kit (200 g total) by mouth once.     Butch Penny, NP     ROS: As above in the HPI. All other systems are stable or negative.  OBJECTIVE: APPEARANCE:  Patient in no acute distress.The patient appeared well nourished and normally developed. Acyanotic. Waist: VITAL SIGNS:BP 119/72  Pulse 77  Temp(Src) 98.4 F (36.9 C) (Oral)  Ht 5' 2"  (1.575 m)  Wt 195 lb 6.4 oz (88.633 kg)  BMI 35.73 kg/m2   SKIN: warm and  Dry without overt rashes, tattoos and scars  HEAD and Neck: without JVD, Head and scalp: normal. No temporal artery tenderness.Terie Purser eleft face is a little sensitive to touch. Eyes:No scleral icterus. Fundi normal, eye movements normal. Ears: Auricle normal, canal normal, Tympanic membranes normal, insufflation normal. Nose: normal Throat: normal Neck & thyroid: normal  CHEST & LUNGS: Chest wall: normal Lungs: Clear  CVS: Reveals the PMI to be normally located. Regular rhythm, First and Second Heart sounds are normal,  absence of murmurs, rubs or gallops. Peripheral vasculature: Radial pulses: normal Dorsal pedis pulses: normal Posterior pulses: normal  ABDOMEN:  Appearance: normal Benign, no organomegaly, no masses, no Abdominal Aortic enlargement. No Guarding , no rebound. No Bruits. Bowel sounds: normal  RECTAL: N/A GU: N/A  EXTREMETIES: nonedematous.  MUSCULOSKELETAL:  Spine: normal Joints: intact  NEUROLOGIC: oriented to time,place and person; nonfocal. Strength is normal Sensory is normal Reflexes are normal Cranial Nerves are normal.  ASSESSMENT: Trigeminal neuralgia syndrome  Need for 23-polyvalent pneumococcal polysaccharide vaccine - Plan: Pneumococcal polysaccharide vaccine 23-valent greater than or equal to 2yo subcutaneous/IM  Memory impairment  Hypertension  DYSLIPIDEMIA - Plan: Lipid panel  PERIPHERAL NEUROPATHY  OSTEOARTHRITIS  FIBROMYALGIA  GERD (gastroesophageal reflux disease)  Esophageal reflux  Irritable bowel syndrome  Unspecified vitamin  D deficiency - Plan: Vit D  25 hydroxy (rtn osteoporosis monitoring)  Screening examination for infectious disease - Plan: Hepatitis C antibody  PLAN: Handout on Trigeminal neuralgia in th eAVS.       Dr Paula Libra Recommendations  For nutrition information, I recommend books:  1).Eat to Live by Dr Excell Seltzer. 2).Prevent and Reverse Heart Disease by Dr Karl Luke. 3) Dr Janene Harvey Book:  Program to Reverse Diabetes  Exercise recommendations are:  If unable to walk, then the patient can exercise in a chair 3 times a day. By flapping arms like a bird gently and raising legs outwards to the front.  If ambulatory, the patient can go  for walks for 30 minutes 3 times a week. Then increase the intensity and duration as tolerated.  Goal is to try to attain exercise frequency to 5 times a week.  If applicable: Best to perform resistance exercises (machines or weights) 2 days a week and cardio type exercises 3 days per week.   Orders Placed This Encounter  Procedures  . Pneumococcal polysaccharide vaccine 23-valent greater than or equal to 2yo subcutaneous/IM  . Vit D  25 hydroxy (rtn osteoporosis monitoring)   Meds ordered this encounter  Medications  . carbamazepine (TEGRETOL) 200 MG tablet    Sig: Take 100 mg by mouth daily.   Medications Discontinued During This Encounter  Medication Reason  . carbamazepine (TEGRETOL) 200 MG tablet    Return in about 3 months (around 05/04/2014) for Recheck medical problems.  Jadavion Spoelstra P. Jacelyn Grip, M.D.

## 2014-02-03 LAB — HEPATITIS C ANTIBODY: Hep C Virus Ab: <0.1 s/co ratio (ref 0.0–0.9)

## 2014-02-03 LAB — VITAMIN D 25 HYDROXY (VIT D DEFICIENCY, FRACTURES): Vit D, 25-Hydroxy: 35.1 ng/mL (ref 30.0–100.0)

## 2014-02-03 LAB — LIPID PANEL
Chol/HDL Ratio: 3.8 ratio units (ref 0.0–4.4)
Cholesterol, Total: 164 mg/dL (ref 100–199)
HDL: 43 mg/dL (ref >39)
LDL Calculated: 91 mg/dL (ref 0–99)
Triglycerides: 152 mg/dL — ABNORMAL HIGH (ref 0–149)
VLDL Cholesterol Cal: 30 mg/dL (ref 5–40)

## 2014-02-21 ENCOUNTER — Telehealth (INDEPENDENT_AMBULATORY_CARE_PROVIDER_SITE_OTHER): Payer: Self-pay | Admitting: *Deleted

## 2014-02-21 NOTE — Telephone Encounter (Signed)
agree

## 2014-02-21 NOTE — Telephone Encounter (Signed)
  Procedure: tcs  Reason/Indication:  screening  Has patient had this procedure before?  Yes, 13-14 years ago  If so, when, by whom and where?    Is there a family history of colon cancer?  no  Who?  What age when diagnosed?    Is patient diabetic?   no      Does patient have prosthetic heart valve?  no  Do you have a pacemaker?  no  Has patient ever had endocarditis? no  Has patient had joint replacement within last 12 months?  no  Does patient tend to be constipated or take laxatives? no  Is patient on Coumadin, Plavix and/or Aspirin? yes  Medications: asa 81 mg daily, simvastatin 40 mg daily, omeprazole 20 mg daily, gabapentin 300 mg 6 tabs daily, hctz 25 mg daily, calcium daily, vit d bid, fish oil qid  Allergies: nkda  Medication Adjustment: asa 2 days  Procedure date & time: 03/22/14

## 2014-03-07 ENCOUNTER — Encounter (HOSPITAL_COMMUNITY): Payer: Self-pay | Admitting: Pharmacy Technician

## 2014-03-22 ENCOUNTER — Encounter (HOSPITAL_COMMUNITY)
Admission: RE | Disposition: A | Discharge status: Home / Self Care | Payer: Self-pay | Source: Ambulatory Visit | Attending: Internal Medicine

## 2014-03-22 ENCOUNTER — Ambulatory Visit (HOSPITAL_COMMUNITY)
Admission: RE | Admit: 2014-03-22 | Discharge: 2014-03-22 | Disposition: A | Discharge status: Home / Self Care | Payer: Medicare Other | Source: Ambulatory Visit | Attending: Internal Medicine | Admitting: Internal Medicine

## 2014-03-22 ENCOUNTER — Encounter (HOSPITAL_COMMUNITY): Payer: Self-pay | Admitting: *Deleted

## 2014-03-22 DIAGNOSIS — IMO0001 Reserved for inherently not codable concepts without codable children: Secondary | ICD-10-CM | POA: Insufficient documentation

## 2014-03-22 DIAGNOSIS — Z1211 Encounter for screening for malignant neoplasm of colon: Secondary | ICD-10-CM

## 2014-03-22 DIAGNOSIS — Z7982 Long term (current) use of aspirin: Secondary | ICD-10-CM | POA: Insufficient documentation

## 2014-03-22 DIAGNOSIS — Z886 Allergy status to analgesic agent status: Secondary | ICD-10-CM | POA: Insufficient documentation

## 2014-03-22 DIAGNOSIS — K5520 Angiodysplasia of colon without hemorrhage: Secondary | ICD-10-CM | POA: Insufficient documentation

## 2014-03-22 DIAGNOSIS — G609 Hereditary and idiopathic neuropathy, unspecified: Secondary | ICD-10-CM | POA: Insufficient documentation

## 2014-03-22 DIAGNOSIS — I10 Essential (primary) hypertension: Secondary | ICD-10-CM | POA: Insufficient documentation

## 2014-03-22 DIAGNOSIS — D126 Benign neoplasm of colon, unspecified: Secondary | ICD-10-CM | POA: Insufficient documentation

## 2014-03-22 DIAGNOSIS — K5730 Diverticulosis of large intestine without perforation or abscess without bleeding: Secondary | ICD-10-CM | POA: Insufficient documentation

## 2014-03-22 DIAGNOSIS — E785 Hyperlipidemia, unspecified: Secondary | ICD-10-CM | POA: Insufficient documentation

## 2014-03-22 DIAGNOSIS — M19049 Primary osteoarthritis, unspecified hand: Secondary | ICD-10-CM | POA: Insufficient documentation

## 2014-03-22 DIAGNOSIS — K589 Irritable bowel syndrome without diarrhea: Secondary | ICD-10-CM | POA: Insufficient documentation

## 2014-03-22 DIAGNOSIS — K219 Gastro-esophageal reflux disease without esophagitis: Secondary | ICD-10-CM | POA: Insufficient documentation

## 2014-03-22 DIAGNOSIS — Z79899 Other long term (current) drug therapy: Secondary | ICD-10-CM | POA: Insufficient documentation

## 2014-03-22 HISTORY — PX: COLONOSCOPY: SHX5424

## 2014-03-22 SURGERY — COLONOSCOPY
Anesthesia: Moderate Sedation

## 2014-03-22 MED ORDER — MIDAZOLAM HCL 5 MG/5ML IJ SOLN
INTRAMUSCULAR | Status: DC | PRN
  Administered 2014-03-22 (×3): 2 mg via INTRAVENOUS

## 2014-03-22 MED ORDER — MIDAZOLAM HCL 5 MG/5ML IJ SOLN
INTRAMUSCULAR | Status: AC
  Filled 2014-03-22: qty 10

## 2014-03-22 MED ORDER — SODIUM CHLORIDE 0.9 % IV SOLN
INTRAVENOUS | Status: DC
  Administered 2014-03-22: 09:00:00 via INTRAVENOUS

## 2014-03-22 MED ORDER — STERILE WATER FOR IRRIGATION IR SOLN
Status: DC | PRN
  Administered 2014-03-22: 10:00:00

## 2014-03-22 MED ORDER — MEPERIDINE HCL 50 MG/ML IJ SOLN
INTRAMUSCULAR | Status: DC | PRN
  Administered 2014-03-22 (×2): 25 mg via INTRAVENOUS

## 2014-03-22 MED ORDER — MEPERIDINE HCL 50 MG/ML IJ SOLN
INTRAMUSCULAR | Status: AC
  Filled 2014-03-22: qty 1

## 2014-03-22 NOTE — H&P (Signed)
Charlotte Aguirre is an 68 y.o. female.   Chief Complaint: Patient is here for colonoscopy. HPI: Patient is 68 year old Caucasian female who is here for screening colonoscopy. She denies abdominal pain change in bowel habits or rectal bleeding. Last colonoscopy was about 14 years ago.  Family history is negative for CRC.  Past Medical History  Diagnosis Date  . GERD (gastroesophageal reflux disease)   . Fibromyalgia   . Peripheral neuropathy   . Arthritis     thumb and fingers  . Hypertension   . Hyperlipidemia   . IBS (irritable bowel syndrome)     Past Surgical History  Procedure Laterality Date  . Abdominal hysterectomy    . Cholecystectomy      Family History  Problem Relation Age of Onset  . Dementia Brother    Social History:  reports that she has quit smoking. Her smoking use included Cigarettes. She smoked 0.00 packs per day. She does not have any smokeless tobacco history on file. She reports that she does not drink alcohol or use illicit drugs.  Allergies:  Allergies  Allergen Reactions  . Aspirin     Must take EC, prior history of stomach ulcer.    Medications Prior to Admission  Medication Sig Dispense Refill  . aspirin 81 MG chewable tablet Chew by mouth daily.      . Calcium Carb-Cholecalciferol (CALCIUM 1000 + D PO) Take 1 tablet by mouth 2 (two) times daily.       . carbamazepine (TEGRETOL) 200 MG tablet Take 200 mg by mouth at bedtime.       . Cholecalciferol (VITAMIN D-3 PO) Take 1 tablet by mouth 2 (two) times daily.       Marland Kitchen gabapentin (NEURONTIN) 300 MG capsule Take 600 mg by mouth 3 (three) times daily.       . hydrochlorothiazide (HYDRODIURIL) 25 MG tablet Take 1 tablet (25 mg total) by mouth daily.  30 tablet  5  . Multiple Vitamins-Minerals (MULTIVITAMINS THER. W/MINERALS) TABS tablet Take 1 tablet by mouth daily.      . Omega-3 Fatty Acids (FISH OIL) 500 MG CAPS Take 2 capsules by mouth 2 (two) times daily.       Marland Kitchen omeprazole (PRILOSEC) 20 MG capsule  Take 20 mg by mouth 2 (two) times daily before a meal.       . simvastatin (ZOCOR) 40 MG tablet Take 40 mg by mouth daily.        No results found for this or any previous visit (from the past 48 hour(s)). No results found.  ROS  Blood pressure 147/65, pulse 68, temperature 98 F (36.7 C), temperature source Oral, resp. rate 18, height 5\' 2"  (1.575 m), weight 195 lb (88.451 kg), SpO2 98.00%. Physical Exam  Constitutional: She appears well-developed and well-nourished.  HENT:  Mouth/Throat: Oropharynx is clear and moist.  Eyes: Conjunctivae are normal. No scleral icterus.  Neck: No thyromegaly present.  Cardiovascular: Normal rate, regular rhythm and normal heart sounds.   No murmur heard. Respiratory: Effort normal and breath sounds normal.  GI: Soft. She exhibits no distension and no mass. There is no tenderness.  Musculoskeletal: She exhibits no edema.  Lymphadenopathy:    She has no cervical adenopathy.  Neurological: She is alert.  Skin: Skin is warm and dry.     Assessment/Plan Average risk screening colonoscopy.  Rogene Houston 03/22/2014, 9:46 AM

## 2014-03-22 NOTE — Op Note (Addendum)
COLONOSCOPY PROCEDURE REPORT  PATIENT:  Charlotte Aguirre  MR#:  [id number] Birthdate:  [birth year], 68 y.o., female Endoscopist:  Dr. Rogene Houston, MD Referred By:  Dr. Redge Gainer, MD Procedure Date: 03/22/2014  Procedure:   Colonoscopy  Indications:  Patient is 68 year old Caucasian female was undergoing average risk screening colonoscopy. Her last exam was about 14 years ago.  Informed Consent:  The procedure and risks were reviewed with the patient and informed consent was obtained.  Medications:  Demerol 50 mg IV Versed 6 mg IV  Description of procedure:  After a digital rectal exam was performed, that colonoscope was advanced from the anus through the rectum and colon to the area of the cecum, ileocecal valve and appendiceal orifice. The cecum was deeply intubated. These structures were well-seen and photographed for the record. From the level of the cecum and ileocecal valve, the scope was slowly and cautiously withdrawn. The mucosal surfaces were carefully surveyed utilizing scope tip to flexion to facilitate fold flattening as needed. The scope was pulled down into the rectum where a thorough exam including retroflexion was performed.  Findings:   Prep excellent. Small polyp ablated via cold biopsy from splenic flexure. Few small diverticula at sigmoid colon. Single small AV malformation noted in rectum. Unremarkable anorectal junction.   Therapeutic/Diagnostic Maneuvers Performed:  See above  Complications:  None  Cecal Withdrawal Time:  13 minutes  Impression:  Examination performed to cecum. Small polyp ablated via cold biopsy from splenic flexure. Mild sigmoid colon diverticulosis. Incidental finding of small rectal AV malformation.   Recommendations:  Standard instructions given. High fiber diet. I will contact patient with biopsy results and further recommendations.  Rogene Houston  03/22/2014 10:16 AM  CC: Dr. Redge Gainer, MD & Dr. Rayne Du ref. provider  found

## 2014-03-22 NOTE — Discharge Instructions (Signed)
Resume usual medications and high fiber diet. No driving for 24 hours. Physician will call with biopsy results.   Colonoscopy, Care After Refer to this sheet in the next few weeks. These instructions provide you with information on caring for yourself after your procedure. Your health care provider may also give you more specific instructions. Your treatment has been planned according to current medical practices, but problems sometimes occur. Call your health care provider if you have any problems or questions after your procedure. WHAT TO EXPECT AFTER THE PROCEDURE  After your procedure, it is typical to have the following:  A small amount of blood in your stool.  Moderate amounts of gas and mild abdominal cramping or bloating. HOME CARE INSTRUCTIONS  Do not drive, operate machinery, or sign important documents for 24 hours.  You may shower and resume your regular physical activities, but move at a slower pace for the first 24 hours.  Take frequent rest periods for the first 24 hours.  Walk around or put a warm pack on your abdomen to help reduce abdominal cramping and bloating.  Drink enough fluids to keep your urine clear or pale yellow.  You may resume your normal diet as instructed by your health care provider. Avoid heavy or fried foods that are hard to digest.  Avoid drinking alcohol for 24 hours or as instructed by your health care provider.  Only take over-the-counter or prescription medicines as directed by your health care provider.  If a tissue sample (biopsy) was taken during your procedure:  Do not take aspirin or blood thinners for 7 days, or as instructed by your health care provider.  Do not drink alcohol for 7 days, or as instructed by your health care provider.  Eat soft foods for the first 24 hours. SEEK MEDICAL CARE IF: You have persistent spotting of blood in your stool 2 3 days after the procedure. SEEK IMMEDIATE MEDICAL CARE IF:  You have more than a  small spotting of blood in your stool.  You pass large blood clots in your stool.  Your abdomen is swollen (distended).  You have nausea or vomiting.  You have a fever.  You have increasing abdominal pain that is not relieved with medicine. Document Released: 06/03/2004 Document Revised: 08/10/2013 Document Reviewed: 06/27/2013 [facility] Patient Information 2014 [place]. Diverticulosis Diverticulosis is a common condition that develops when small pouches (diverticula) form in the wall of the colon. The risk of diverticulosis increases with age. It happens more often in people who eat a low-fiber diet. Most individuals with diverticulosis have no symptoms. Those individuals with symptoms usually experience abdominal pain, constipation, or loose stools (diarrhea). HOME CARE INSTRUCTIONS   Increase the amount of fiber in your diet as directed by your caregiver or dietician. This may reduce symptoms of diverticulosis.  Your caregiver may recommend taking a dietary fiber supplement.  Drink at least 6 to 8 glasses of water each day to prevent constipation.  Try not to strain when you have a bowel movement.  Your caregiver may recommend avoiding nuts and seeds to prevent complications, although this is still an uncertain benefit.  Only take over-the-counter or prescription medicines for pain, discomfort, or fever as directed by your caregiver. FOODS WITH HIGH FIBER CONTENT INCLUDE:  Fruits. Apple, peach, pear, tangerine, raisins, prunes.  Vegetables. Brussels sprouts, asparagus, broccoli, cabbage, carrot, cauliflower, romaine lettuce, spinach, summer squash, tomato, winter squash, zucchini.  Starchy Vegetables. Baked beans, kidney beans, lima beans, split peas, lentils, potatoes (with skin).  Grains. Whole wheat bread, brown rice, bran flake cereal, plain oatmeal, white rice, shredded wheat, bran muffins. SEEK IMMEDIATE MEDICAL CARE IF:   You develop increasing pain or severe  bloating.  You have an oral temperature above 102 F (38.9 C), not controlled by medicine.  You develop vomiting or bowel movements that are bloody or black. Document Released: 07/17/2004 Document Revised: 01/12/2012 Document Reviewed: 03/20/2010 [facility] Patient Information 2014 [place]. Colon Polyps Polyps are lumps of extra tissue growing inside the body. Polyps can grow in the large intestine (colon). Most colon polyps are noncancerous (benign). However, some colon polyps can become cancerous over time. Polyps that are larger than a pea may be harmful. To be safe, caregivers remove and test all polyps. CAUSES  Polyps form when mutations in the genes cause your cells to grow and divide even though no more tissue is needed. RISK FACTORS There are a number of risk factors that can increase your chances of getting colon polyps. They include:  Being older than 50 years.  Family history of colon polyps or colon cancer.  Long-term colon diseases, such as colitis or Crohn disease.  Being overweight.  Smoking.  Being inactive.  Drinking too much alcohol. SYMPTOMS  Most small polyps do not cause symptoms. If symptoms are present, they may include:  Blood in the stool. The stool may look dark red or black.  Constipation or diarrhea that lasts longer than 1 week. DIAGNOSIS People often do not know they have polyps until their caregiver finds them during a regular checkup. Your caregiver can use 4 tests to check for polyps:  Digital rectal exam. The caregiver wears gloves and feels inside the rectum. This test would find polyps only in the rectum.  Barium enema. The caregiver puts a liquid called barium into your rectum before taking X-rays of your colon. Barium makes your colon look white. Polyps are dark, so they are easy to see in the X-ray pictures.  Sigmoidoscopy. A thin, flexible tube (sigmoidoscope) is placed into your rectum. The sigmoidoscope has a light and tiny  camera in it. The caregiver uses the sigmoidoscope to look at the last third of your colon.  Colonoscopy. This test is like sigmoidoscopy, but the caregiver looks at the entire colon. This is the most common method for finding and removing polyps. TREATMENT  Any polyps will be removed during a sigmoidoscopy or colonoscopy. The polyps are then tested for cancer. PREVENTION  To help lower your risk of getting more colon polyps:  Eat plenty of fruits and vegetables. Avoid eating fatty foods.  Do not smoke.  Avoid drinking alcohol.  Exercise every day.  Lose weight if recommended by your caregiver.  Eat plenty of calcium and folate. Foods that are rich in calcium include milk, cheese, and broccoli. Foods that are rich in folate include chickpeas, kidney beans, and spinach. HOME CARE INSTRUCTIONS Keep all follow-up appointments as directed by your caregiver. You may need periodic exams to check for polyps. SEEK MEDICAL CARE IF: You notice bleeding during a bowel movement. Document Released: 07/16/2004 Document Revised: 01/12/2012 Document Reviewed: 12/30/2011 [facility] Patient Information 2014 [place].

## 2014-03-23 ENCOUNTER — Encounter (HOSPITAL_COMMUNITY): Payer: Self-pay | Admitting: Internal Medicine

## 2014-03-24 ENCOUNTER — Encounter (INDEPENDENT_AMBULATORY_CARE_PROVIDER_SITE_OTHER): Payer: Self-pay | Admitting: *Deleted

## 2014-04-01 ENCOUNTER — Other Ambulatory Visit: Payer: Self-pay | Admitting: *Deleted

## 2014-04-01 MED ORDER — GABAPENTIN 300 MG PO CAPS: 600.0000 mg | ORAL_CAPSULE | Freq: Three times a day (TID) | ORAL | Status: DC

## 2014-04-01 NOTE — Telephone Encounter (Signed)
Gabapentin refilled and sent to [place]. Patient aware.

## 2014-04-14 ENCOUNTER — Ambulatory Visit (INDEPENDENT_AMBULATORY_CARE_PROVIDER_SITE_OTHER): Payer: Medicare Other | Admitting: *Deleted

## 2014-04-14 ENCOUNTER — Telehealth: Payer: Self-pay | Admitting: Family Medicine

## 2014-04-14 DIAGNOSIS — IMO0001 Reserved for inherently not codable concepts without codable children: Secondary | ICD-10-CM

## 2014-04-14 NOTE — Progress Notes (Signed)
Patient comes in today requesting follow up appt for trigeminal neuralgia.  She states that the medication Dr Jacelyn Grip gave her is not helping. She continues to develop headaches and left sided facial pain. She has not been seen by a neurologist.  Appt scheduled for 04/18/14 with Evelina Dun, FNP. Pt aware.

## 2014-04-14 NOTE — Telephone Encounter (Signed)
Taken care of in another encounter 

## 2014-04-18 ENCOUNTER — Encounter: Payer: Self-pay | Admitting: Family

## 2014-04-18 ENCOUNTER — Ambulatory Visit (INDEPENDENT_AMBULATORY_CARE_PROVIDER_SITE_OTHER): Payer: Medicare Other | Admitting: Family

## 2014-04-18 VITALS — BP 125/73 | HR 76 | Temp 98.9°F | Ht 62.0 in | Wt 193.2 lb

## 2014-04-18 DIAGNOSIS — G500 Trigeminal neuralgia: Secondary | ICD-10-CM

## 2014-04-18 MED ORDER — CARBAMAZEPINE 200 MG PO TABS: 400.0000 mg | ORAL_TABLET | Freq: Two times a day (BID) | ORAL | Status: DC

## 2014-04-18 NOTE — Patient Instructions (Addendum)
Trigeminal Neuralgia Trigeminal neuralgia is a nerve disorder that causes sudden attacks of severe facial pain. It is caused by damage to the trigeminal nerve, a major nerve in the face. It is more common in women and in the elderly, although it can also happen in younger patients. Attacks last from a few seconds to several minutes and can occur from a couple of times per year to several times per day. Trigeminal neuralgia can be a very distressing and disabling condition. Surgery may be needed in very severe cases if medical treatment does not give relief. HOME CARE INSTRUCTIONS   If your caregiver prescribed medication to help prevent attacks, take as directed.  To help prevent attacks:  Chew on the unaffected side of the mouth.  Avoid touching your face.  Avoid blasts of hot or cold air.  Men may wish to grow a beard to avoid having to shave. SEEK IMMEDIATE MEDICAL CARE IF:  Pain is unbearable and your medicine does not help.  You develop new, unexplained symptoms (problems).  You have problems that may be related to a medication you are taking. Document Released: 10/17/2000 Document Revised: 01/12/2012 Document Reviewed: 08/17/2009 [employer] Patient Information 2014 [employer].  

## 2014-04-18 NOTE — Progress Notes (Signed)
   Subjective:    Patient ID: Charlotte Aguirre, female    DOB: 10-17-1946, 68 y.o.   MRN: [id number]  Headache  This is a recurrent problem. The current episode started more than 1 month ago (About three months ago). The problem occurs daily. The problem has been waxing and waning. The pain is located in the left unilateral and retro-orbital region. The pain radiates to the face. The pain quality is similar to prior headaches. The quality of the pain is described as stabbing and shooting. The pain is at a severity of 10/10. The pain is severe. Associated symptoms include ear pain (left ear), phonophobia and photophobia. Pertinent negatives include no blurred vision, dizziness, nausea, sinus pressure or visual change. She has tried NSAIDs and acetaminophen for the symptoms. The treatment provided mild relief. Her past medical history is significant for migraine headaches.   *Pt was diagnosed with trigeminal neuralgia syndrome two months ago. PT was started on tegretol 200 mg daily with mild relief. Pt states she is still currently have headaches daily, some days worse than others.   Review of Systems  HENT: Positive for ear pain (left ear). Negative for sinus pressure.   Eyes: Positive for photophobia. Negative for blurred vision.  Gastrointestinal: Negative for nausea.  Neurological: Positive for headaches. Negative for dizziness.  All other systems reviewed and are negative.      Objective:   Physical Exam  Vitals reviewed. Constitutional: She is oriented to person, place, and time. She appears well-developed and well-nourished. No distress.  HENT:  Head: Normocephalic and atraumatic.  Right Ear: External ear normal.  Left Ear: External ear normal.  Mouth/Throat: Oropharynx is clear and moist.  Eyes: Pupils are equal, round, and reactive to light.  Neck: Normal range of motion. Neck supple. No thyromegaly present.  Cardiovascular: Normal rate, regular rhythm, normal heart sounds and intact  distal pulses.   No murmur heard. Pulmonary/Chest: Effort normal and breath sounds normal. No respiratory distress. She has no wheezes.  Abdominal: Soft. Bowel sounds are normal. She exhibits no distension. There is no tenderness.  Musculoskeletal: Normal range of motion. She exhibits no edema and no tenderness.  Neurological: She is alert and oriented to person, place, and time. She has normal reflexes. She displays normal reflexes. No cranial nerve deficit. She exhibits normal muscle tone. Coordination normal.  Skin: Skin is warm and dry.  Psychiatric: She has a normal mood and affect. Her behavior is normal. Judgment and thought content normal.     BP 125/73  Pulse 76  Temp(Src) 98.9 F (37.2 C) (Oral)  Ht 5\' 2"  (1.575 m)  Wt 193 lb 3.2 oz (87.635 kg)  BMI 35.33 kg/m2      Assessment & Plan:  1. Trigeminal neuralgia -Chew on right side of mouth -Avoid blasts of hot or cold air -Follow-up with neurology to adjust medications accordingly - carbamazepine (TEGRETOL) 200 MG tablet; Take 2 tablets (400 mg total) by mouth 2 (two) times daily.  Dispense: 60 tablet; Refill: 6 - Ambulatory referral to Neurology -Appointment for mammogram schduled  Evelina Dun, FNP

## 2014-05-08 ENCOUNTER — Encounter: Payer: Self-pay | Admitting: *Deleted

## 2014-05-10 ENCOUNTER — Ambulatory Visit (INDEPENDENT_AMBULATORY_CARE_PROVIDER_SITE_OTHER): Payer: Medicare Other | Admitting: Neurology

## 2014-05-10 ENCOUNTER — Encounter: Payer: Self-pay | Admitting: Neurology

## 2014-05-10 VITALS — BP 122/68 | HR 68 | Temp 98.3°F | Resp 16 | Ht 63.0 in | Wt 193.8 lb

## 2014-05-10 DIAGNOSIS — G4485 Primary stabbing headache: Secondary | ICD-10-CM

## 2014-05-10 DIAGNOSIS — G501 Atypical facial pain: Secondary | ICD-10-CM

## 2014-05-10 LAB — BASIC METABOLIC PANEL
BUN: 15 mg/dL (ref 6–23)
CO2: 32 mEq/L (ref 19–32)
Calcium: 9.2 mg/dL (ref 8.4–10.5)
Chloride: 101 mEq/L (ref 96–112)
Creat: 0.73 mg/dL (ref 0.50–1.10)
Glucose, Bld: 92 mg/dL (ref 70–99)
Potassium: 3.9 mEq/L (ref 3.5–5.3)
Sodium: 141 mEq/L (ref 135–145)

## 2014-05-10 MED ORDER — OXCARBAZEPINE 150 MG PO TABS: ORAL_TABLET | ORAL | Status: DC

## 2014-05-10 NOTE — Progress Notes (Signed)
NEUROLOGY CONSULTATION NOTE  DER GAGLIANO MRN: [id number] DOB: 07-Feb-1946  Referring provider: Evelina Dun, FNP Primary care provider: Evelina Dun, FNP  Reason for consult:  Headache and facial pain (trigeminal neuralgia)  HISTORY OF PRESENT ILLNESS: Charlotte Aguirre is a 68 year old right-handed woman with history of migraines, hypertension, hyperlipidemia, peripheral neuropathy, IBS, osteoarthritis, fibromyalgia, and GERD who presents for trigeminal neuralgia.  She is accompanied by her husband. Records and MRI personally reviewed.  She began experiencing symptoms approximately 5 months ago. She has a focal stabbing headache on the top of the left side of her head. It lasts for about 5-6 seconds. The pain will shoot down to the left nasolabial fold. She also experiences an aching pain on the left side of her nose as well. This lasts approximately 10-15 minutes. She also notes a "funny "feeling on the left side of her face, specifically tingling. She also notes crackling sound in her left ear. Initially they occurred daily. Intensity of the head pain is 10 out of 10. Intensity of facial pain as 5/10. It is not triggered by anything, such as chewing, talking, brushing her teeth, or evening. There is no associated symptoms such as nausea, vomiting, photophobia, phonophobia, visual disturbance, or autonomic symptoms. It is not relieved by anything. She will taken Advil which eases the pain but does not resolve it. She was started on carbamazepine about 2-1/2 months ago. She feels the intensity is slightly better and they now occur about every other day. She is taking 200 mg twice daily, however this makes her feel dizzy and she's been experiencing word finding difficulties. For many years she has been taking gabapentin 600 mg 3 times daily for fibromyalgia.  She has history of migraines, which are different from this. They present with visual auras with field cut, and sparkling lights. It is associated  with nausea, photophobia, and phonophobia.  12/26/13 MRI BRAIN W/WO:  mild white matter changes.  Incidental left parietal developmental venous anomaly. 12/12/13 LABS:  B12 321, folate 13.9, TSH 2.190, Na 144, K 4.0, BUN 11, Cr 0.72, ALP 117, AST 22, ALT 17 02/02/14 LABS:  Vit D 25-hydroxy 35.1  She has history of stomach ulcers and is unable to take NSAIDs.  PAST MEDICAL HISTORY: Past Medical History  Diagnosis Date  . GERD (gastroesophageal reflux disease)   . Fibromyalgia   . Peripheral neuropathy   . Arthritis     thumb and fingers  . Hypertension   . Hyperlipidemia   . IBS (irritable bowel syndrome)   . Headache(784.0)     PAST SURGICAL HISTORY: Past Surgical History  Procedure Laterality Date  . Abdominal hysterectomy    . Cholecystectomy    . Colonoscopy N/A 03/22/2014    Procedure: COLONOSCOPY;  Surgeon: Rogene Houston, MD;  Location: [facility];  Service: Endoscopy;  Laterality: N/A;  930    MEDICATIONS: Current Outpatient Prescriptions on File Prior to Visit  Medication Sig Dispense Refill  . aspirin 81 MG chewable tablet Chew by mouth daily.      . Calcium Carb-Cholecalciferol (CALCIUM 1000 + D PO) Take 1 tablet by mouth 2 (two) times daily.       . Cholecalciferol (VITAMIN D-3 PO) Take 1 tablet by mouth 2 (two) times daily.       Marland Kitchen gabapentin (NEURONTIN) 300 MG capsule Take 2 capsules (600 mg total) by mouth 3 (three) times daily.  180 capsule  2  . hydrochlorothiazide (HYDRODIURIL) 25 MG tablet Take 1  tablet (25 mg total) by mouth daily.  30 tablet  5  . Multiple Vitamins-Minerals (MULTIVITAMINS THER. W/MINERALS) TABS tablet Take 1 tablet by mouth daily.      . Omega-3 Fatty Acids (FISH OIL) 500 MG CAPS Take 2 capsules by mouth 2 (two) times daily.       Marland Kitchen omeprazole (PRILOSEC) 20 MG capsule Take 20 mg by mouth 2 (two) times daily before a meal.       . simvastatin (ZOCOR) 40 MG tablet Take 40 mg by mouth daily.       No current facility-administered medications  on file prior to visit.    ALLERGIES: Allergies  Allergen Reactions  . Aspirin     Must take EC, prior history of stomach ulcer.    FAMILY HISTORY: Family History  Problem Relation Age of Onset  . Dementia Brother   . Heart failure Mother   . Heart failure Father   . Heart failure Brother   . Heart failure Sister   . Cancer Brother     LYMPHOMA  . Cancer - Other Brother     lymphoma    SOCIAL HISTORY: History   Social History  . Marital Status: Married    Spouse Name: N/A    Number of Children: N/A  . Years of Education: N/A   Occupational History  . Not on file.   Social History Main Topics  . Smoking status: Former Smoker    Types: Cigarettes  . Smokeless tobacco: Not on file  . Alcohol Use: No  . Drug Use: No  . Sexual Activity: Yes    Partners: Male   Other Topics Concern  . Not on file   Social History Narrative  . No narrative on file    REVIEW OF SYSTEMS: Constitutional: No fevers, chills, or sweats, no generalized fatigue, change in appetite Eyes: No visual changes, double vision, eye pain Ear, nose and throat: No hearing loss, ear pain, nasal congestion, sore throat Cardiovascular: No chest pain, palpitations Respiratory:  No shortness of breath at rest or with exertion, wheezes GastrointestinaI: No nausea, vomiting, diarrhea, abdominal pain, fecal incontinence Genitourinary:  No dysuria, urinary retention or frequency Musculoskeletal:  No neck pain, back pain Integumentary: No rash, pruritus, skin lesions Neurological: as above Psychiatric: No depression, insomnia, anxiety Endocrine: No palpitations, fatigue, diaphoresis, mood swings, change in appetite, change in weight, increased thirst Hematologic/Lymphatic:  No anemia, purpura, petechiae. Allergic/Immunologic: no itchy/runny eyes, nasal congestion, recent allergic reactions, rashes  PHYSICAL EXAM: Filed Vitals:   05/10/14 0912  BP: 122/68  Pulse: 68  Temp: 98.3 F (36.8 C)    Resp: 16   General: No acute distress Head:  Normocephalic/atraumatic Neck: supple, no paraspinal tenderness, full range of motion Back: No paraspinal tenderness Heart: regular rate and rhythm Lungs: Clear to auscultation bilaterally. Vascular: No carotid bruits. Neurological Exam: Mental status: alert and oriented to person, place, and time, recent and remote memory intact, fund of knowledge intact, attention and concentration intact, speech fluent and not dysarthric, language intact. Cranial nerves: CN I: not tested CN II: pupils equal, round and reactive to light, visual fields intact, fundi unremarkable, without vessel changes, exudates, hemorrhages or papilledema. CN III, IV, VI:  full range of motion, no nystagmus, no ptosis CN V: facial sensation intact CN VII: upper and lower face symmetric CN VIII: hearing intact CN IX, X: gag intact, uvula midline CN XI: sternocleidomastoid and trapezius muscles intact CN XII: tongue midline Bulk & Tone: normal, no fasciculations.  Motor: 5 out of 5 throughout Sensation: Temperature and vibration intact Deep Tendon Reflexes: 2+ throughout except absent in the ankles, toes downgoing Finger to nose testing: No dysmetria Heel to shin: No dysmetria Gait: Normal station and stride. Able to turn. A little unsteady with tandem gait. Romberg negative.  IMPRESSION: Left-sided atypical facial pain. The stabbing pain on the top of her head is more consistent with a primary stabbing headache. It is not classic trigeminal neuralgia. Ideally, I would like to try a trial of indomethacin, but this is not possible due to her history of stomach ulcers and she has not been able to tolerate NSAIDs in the past.  PLAN: 1. Due to side effects, we will stop the carbamazepine. Instead, we will initiate Trileptal. We will start with 150 mg twice daily for 1 week, and then she will increase to 300 mg twice daily. 2. We will check a sodium level. 3. She will  continue gabapentin 600 mg 3 times daily for now. 4. Followup in 4 weeks.  Thank you for allowing me to take part in the care of this patient.  Metta Clines, DO  CC:  Evelina Dun, FNP

## 2014-05-10 NOTE — Patient Instructions (Addendum)
It is not classic trigeminal neuralgia, but it is an atypical facial pain. 1.  Stop the Tegretol 2.  We will start Trileptal (oxcarbazepine) 150mg  tablets.  Take 1 tablet twice daily for 7 days, then increase to 2 tablets twice daily. 3.  We will check a sodium level. 4.  Continue the gabapentin 5.  Follow up in 4 weeks. 6.BMP

## 2014-05-30 ENCOUNTER — Encounter: Payer: Self-pay | Admitting: Family Medicine

## 2014-05-30 ENCOUNTER — Ambulatory Visit (INDEPENDENT_AMBULATORY_CARE_PROVIDER_SITE_OTHER): Payer: Medicare Other | Admitting: Family Medicine

## 2014-05-30 VITALS — BP 120/66 | HR 72 | Temp 99.1°F | Ht 62.0 in | Wt 189.4 lb

## 2014-05-30 DIAGNOSIS — J029 Acute pharyngitis, unspecified: Secondary | ICD-10-CM

## 2014-05-30 LAB — POCT RAPID STREP A (OFFICE): Rapid Strep A Screen: NEGATIVE

## 2014-05-30 MED ORDER — AZITHROMYCIN 250 MG PO TABS: ORAL_TABLET | ORAL | Status: DC

## 2014-05-30 NOTE — Progress Notes (Signed)
   Subjective:    Patient ID: Charlotte Aguirre, female    DOB: [birth year], 68 y.o.   MRN: [id number]  HPI  This 68 y.o. female presents for evaluation of c/o sore throat. She is worried she may have lymphoma because her lymph nodes  In her neck are swollen and tender.  Review of Systems C/o sore throat No chest pain, SOB, HA, dizziness, vision change, N/V, diarrhea, constipation, dysuria, urinary urgency or frequency, myalgias, arthralgias or rash.     Objective:   Physical Exam Vital signs noted  Well developed well nourished female.  HEENT - Head atraumatic Normocephalic                Eyes - PERRLA, Conjuctiva - clear Sclera- Clear EOMI                Ears - EAC's Wnl TM's Wnl Gross Hearing WN                Throat - oropharanx wnl                Neck - shotty submandibular lymph nodes Respiratory - Lungs CTA bilateral Cardiac - RRR S1 and S2 without murmur GI - Abdomen soft Nontender and bowel sounds active x 4 Extremities - No edema. Neuro - Grossly intact.       Assessment & Plan:  Sore throat - Plan: POCT rapid strep A, azithromycin (ZITHROMAX) 250 MG tablet  WSWG's REassured paitent and instructed her to f/u if not better.  Lysbeth Penner FNP

## 2014-06-19 ENCOUNTER — Telehealth: Payer: Self-pay | Admitting: *Deleted

## 2014-06-19 ENCOUNTER — Ambulatory Visit (INDEPENDENT_AMBULATORY_CARE_PROVIDER_SITE_OTHER): Payer: Medicare Other | Admitting: Family

## 2014-06-19 ENCOUNTER — Encounter: Payer: Self-pay | Admitting: Family

## 2014-06-19 VITALS — BP 124/66 | HR 69 | Temp 98.9°F | Ht 62.0 in | Wt 188.4 lb

## 2014-06-19 DIAGNOSIS — G8929 Other chronic pain: Secondary | ICD-10-CM

## 2014-06-19 DIAGNOSIS — R51 Headache: Secondary | ICD-10-CM

## 2014-06-19 DIAGNOSIS — R519 Headache, unspecified: Secondary | ICD-10-CM

## 2014-06-19 NOTE — Patient Instructions (Signed)

## 2014-06-19 NOTE — Progress Notes (Signed)
   Subjective:    Patient ID: Charlotte Aguirre, female    DOB: Apr 01, 1946, 68 y.o.   MRN: [id number]  HPI Pt presents to the office for follow-up on headaches. Pt has seen numerologists and has been told she does not have trigeminal neuralgia, but have chronic headaches. Pt states she is still having headaches 1-2 times a week with shooting pain across left side of her head down into her face. States it only lasts seconds to mins. Pt states she is not having them as often or severe as they were. Pt states she was given a RX of a medication from neurologists, but has decided not to take it because she read the side effects could be death. Pt states she is taking Advil prn with relief.     Review of Systems  Constitutional: Negative.   Eyes: Negative.   Respiratory: Negative.  Negative for shortness of breath.   Cardiovascular: Negative.  Negative for palpitations.  Gastrointestinal: Negative.   Endocrine: Negative.   Genitourinary: Negative.   Musculoskeletal: Negative.   Neurological: Positive for headaches.  Hematological: Negative.   Psychiatric/Behavioral: Negative.   All other systems reviewed and are negative.      Objective:   Physical Exam  Vitals reviewed. Constitutional: She is oriented to person, place, and time. She appears well-developed and well-nourished. No distress.  HENT:  Head: Normocephalic and atraumatic.  Right Ear: External ear normal.  Left Ear: External ear normal.  Mouth/Throat: Oropharynx is clear and moist.  Eyes: Pupils are equal, round, and reactive to light.  Neck: Normal range of motion. Neck supple. No thyromegaly present.  Cardiovascular: Normal rate, regular rhythm, normal heart sounds and intact distal pulses.   No murmur heard. Pulmonary/Chest: Effort normal and breath sounds normal. No respiratory distress. She has no wheezes.  Abdominal: Soft. Bowel sounds are normal. She exhibits no distension. There is no tenderness.  Musculoskeletal: Normal  range of motion. She exhibits no edema and no tenderness.  Neurological: She is alert and oriented to person, place, and time. She has normal reflexes. No cranial nerve deficit.  Skin: Skin is warm and dry.  Psychiatric: She has a normal mood and affect. Her behavior is normal. Judgment and thought content normal.    BP 124/66  Pulse 69  Temp(Src) 98.9 F (37.2 C) (Oral)  Ht 5\' 2"  (1.575 m)  Wt 188 lb 6.4 oz (85.458 kg)  BMI 34.45 kg/m2       Assessment & Plan:  1. Chronic nonintractable headache, unspecified headache type -Take Advil prn for HA Continue all meds Health Maintenance reviewed-Pt to get mammogram schduled Diet and exercise encouraged RTO in 3 months for chronic f/u Falls precaution discussed   Evelina Dun, FNP

## 2014-06-19 NOTE — Telephone Encounter (Signed)
Patient call stating she needs to cancel because she taking care of her husband and he has a number of appointments coming up. She will call to reschedule once he is better

## 2014-06-20 ENCOUNTER — Ambulatory Visit: Payer: Medicare Other | Admitting: Neurology

## 2014-06-23 ENCOUNTER — Ambulatory Visit (INDEPENDENT_AMBULATORY_CARE_PROVIDER_SITE_OTHER): Payer: Medicare Other | Admitting: Pharmacist

## 2014-06-23 ENCOUNTER — Encounter: Payer: Self-pay | Admitting: Pharmacist

## 2014-06-23 VITALS — BP 118/78 | HR 74 | Ht 62.0 in | Wt 189.5 lb

## 2014-06-23 DIAGNOSIS — E8881 Metabolic syndrome: Secondary | ICD-10-CM

## 2014-06-23 DIAGNOSIS — Z0000 Encounter for general adult medical examination without abnormal findings: Secondary | ICD-10-CM

## 2014-06-23 DIAGNOSIS — E669 Obesity, unspecified: Secondary | ICD-10-CM

## 2014-06-23 DIAGNOSIS — R7303 Prediabetes: Secondary | ICD-10-CM | POA: Insufficient documentation

## 2014-06-23 DIAGNOSIS — Z13820 Encounter for screening for osteoporosis: Secondary | ICD-10-CM

## 2014-06-23 NOTE — Progress Notes (Signed)
Patient ID: [id number] WIGINGTON, female   DOB: 28-Jun-1946, 68 y.o.   MRN: [id number] Subjective:    Charlotte Aguirre is a 68 y.o. female who presents for Medicare Initial Wellness Visit  Preventive Screening-Counseling & Management  Tobacco History  Smoking status  . Former Smoker  . Types: Cigarettes  . Quit date: 11/03/1988  Smokeless tobacco  . Never Used     Current Problems (verified) Patient Active Problem List   Diagnosis Date Noted  . Prediabetes 06/23/2014  . Metabolic syndrome 72/07/4708  . Obesity (BMI 30-39.9) 06/23/2014  . Trigeminal neuralgia syndrome 02/02/2014  . Screening examination for infectious disease 02/02/2014  . Unspecified vitamin D deficiency 02/02/2014  . Need for 23-polyvalent pneumococcal polysaccharide vaccine 02/02/2014  . Annual physical exam 01/03/2014  . Memory impairment 12/12/2013  . GERD (gastroesophageal reflux disease)   . Arthritis   . Hypertension   . EPIGASTRIC PAIN 11/07/2009  . Irritable bowel syndrome 11/06/2009  . ABDOMINAL PAIN, RIGHT LOWER QUADRANT, HX OF 11/06/2009  . PERIPHERAL NEUROPATHY 02/28/2009  . DYSLIPIDEMIA 02/27/2009  . ALLERGIC RHINITIS 02/27/2009  . Esophageal reflux 02/27/2009  . DIVERTICULOSIS, MILD 02/27/2009  . OSTEOARTHRITIS 02/27/2009  . FIBROMYALGIA 02/27/2009  . Palpitations 02/27/2009  . CHEST PAIN 02/27/2009  . PEPTIC ULCER DISEASE, HX OF 02/27/2009    Medications Prior to Visit Current Outpatient Prescriptions on File Prior to Visit  Medication Sig Dispense Refill  . aspirin 81 MG chewable tablet Chew by mouth daily.      . Calcium Carb-Cholecalciferol (CALCIUM 1000 + D PO) Take 1 tablet by mouth 2 (two) times daily.       . Cholecalciferol (VITAMIN D-3 PO) Take 1 tablet by mouth 2 (two) times daily.       Marland Kitchen gabapentin (NEURONTIN) 300 MG capsule Take 2 capsules (600 mg total) by mouth 3 (three) times daily.  180 capsule  2  . hydrochlorothiazide (HYDRODIURIL) 25 MG tablet Take 1 tablet (25 mg total) by  mouth daily.  30 tablet  5  . Multiple Vitamins-Minerals (MULTIVITAMINS THER. W/MINERALS) TABS tablet Take 1 tablet by mouth daily.      . Omega-3 Fatty Acids (FISH OIL) 500 MG CAPS Take 2 capsules by mouth 2 (two) times daily.       Marland Kitchen omeprazole (PRILOSEC) 20 MG capsule Take 20 mg by mouth 2 (two) times daily before a meal.       . simvastatin (ZOCOR) 40 MG tablet Take 40 mg by mouth daily.       No current facility-administered medications on file prior to visit.    Current Medications (verified) Current Outpatient Prescriptions  Medication Sig Dispense Refill  . aspirin 81 MG chewable tablet Chew by mouth daily.      . Calcium Carb-Cholecalciferol (CALCIUM 1000 + D PO) Take 1 tablet by mouth 2 (two) times daily.       . Cholecalciferol (VITAMIN D-3 PO) Take 1 tablet by mouth 2 (two) times daily.       Marland Kitchen gabapentin (NEURONTIN) 300 MG capsule Take 2 capsules (600 mg total) by mouth 3 (three) times daily.  180 capsule  2  . hydrochlorothiazide (HYDRODIURIL) 25 MG tablet Take 1 tablet (25 mg total) by mouth daily.  30 tablet  5  . Multiple Vitamins-Minerals (MULTIVITAMINS THER. W/MINERALS) TABS tablet Take 1 tablet by mouth daily.      . Omega-3 Fatty Acids (FISH OIL) 500 MG CAPS Take 2 capsules by mouth 2 (two) times daily.       Marland Kitchen  omeprazole (PRILOSEC) 20 MG capsule Take 20 mg by mouth 2 (two) times daily before a meal.       . simvastatin (ZOCOR) 40 MG tablet Take 40 mg by mouth daily.       No current facility-administered medications for this visit.     Allergies (verified) Aspirin   PAST HISTORY  Family History Family History  Problem Relation Age of Onset  . Dementia Brother   . Heart attack Brother   . Heart failure Mother   . Heart failure Father   . Heart attack Father   . Glaucoma Father   . Heart failure Brother   . Heart attack Brother   . Heart failure Sister   . Heart attack Sister   . Diabetes Sister   . Obesity Sister   . Cancer Brother     LYMPHOMA  .  Heart attack Brother   . Cancer Brother     lymphoma  . COPD Sister     from 2nd hand smoke - never a smoker    Social History History  Substance Use Topics  . Smoking status: Former Smoker    Types: Cigarettes    Quit date: 11/03/1988  . Smokeless tobacco: Never Used  . Alcohol Use: No     Are there smokers in your home (other than you)? No  Risk Factors Current exercise habits: Home exercise routine includes walking 0.5 hrs per day.  Dietary issues discussed: limiting high calorie and high CHO foods due to obesity, prediabetes, metabolic syndrome and elevated triglycerides  Cardiac risk factors: advanced age (older than 102 for men, 87 for women), dyslipidemia, family history of premature cardiovascular disease, hypertension, obesity (BMI >= 30 kg/m2) and smoking/ tobacco exposure.  Depression Screen (Note: if answer to either of the following is "Yes", a more complete depression screening is indicated)   Over the past 2 weeks, have you felt down, depressed or hopeless? No  Over the past 2 weeks, have you felt little interest or pleasure in doing things? No  Have you lost interest or pleasure in daily life? No  Do you often feel hopeless? No  Do you cry easily over simple problems? No  Activities of Daily Living In your present state of health, do you have any difficulty performing the following activities?:  Driving? No Managing money?  No Feeding yourself? No Getting from bed to chair? No  Climbing a flight of stairs? Sometimes - tries to avoid when able Preparing food and eating?: No Bathing or showering? No Getting dressed: No Getting to the toilet? No Using the toilet:No Moving around from place to place: No In the past year have you fallen or had a near fall?:Yes   Are you sexually active?  Yes  Do you have more than one partner?  No  Hearing Difficulties: a little bit of decreased hearing per patient Do you often ask people to speak up or repeat themselves?  Yes Do you experience ringing or noises in your ears? Yes Do you have difficulty understanding soft or whispered voices? No   Do you feel that you have a problem with memory? Yes  Do you often misplace items? No  Do you feel safe at home?  Yes  Cognitive Testing  Alert? Yes  Normal Appearance?Yes  Oriented to person? Yes    Place? Yes   Time? Yes  Recall of three objects?  No - recalled 2/3 and then got 3rd with assistance  Can perform simple calculations? Yes  Displays appropriate judgment?Yes  Can read the correct time from a watch face?Yes   Advanced Directives have been discussed with the patient? Yes  List the Names of Other Physician/Practitioners you currently use: 1.  Neurologist - Tomi Likens 2.  GI - Rehman 3.  Optometrist - [facility] at [facility] any recent [employer] you may have received from other than [facility] providers in the past year (date may be approximate).  Immunization History  Administered Date(s) Administered  . Pneumococcal Polysaccharide-23 02/02/2014    Screening Tests Health Maintenance  Topic Date Due  . Influenza Vaccine  06/03/2014  . Tetanus/tdap  07/06/2014 (Originally 10/10/1963)  . Mammogram  12/20/2014 (Originally 12/31/2012)  . Zostavax  12/20/2014 (Originally 10/09/2004)  . Colonoscopy  03/22/2024  . Pneumococcal Polysaccharide Vaccine Age 22 And Over  Completed    All answers were reviewed with the patient and necessary referrals were made:  Cherre Robins, [facility]   06/23/2014   History reviewed: allergies, current medications, past family history, past medical history, past social history, past surgical history and problem list   Objective:     Body mass index is 34.65 kg/(m^2). BP 118/78  Pulse 74  Ht 5\' 2"  (1.575 m)  Wt 189 lb 8 oz (85.957 kg)  BMI 34.65 kg/m2   Assessment:     Initial Medicare Wellness Visit Metabolic syndrome/ prediabetes / elevated triglycerides Obesity     Plan:     During the course  of the visit the patient was educated and counseled about appropriate screening and preventive services including:    Pneumococcal vaccine - UTD  Influenza vaccine - to get in Fall 2015  Hepatitis B vaccine - declined  Td vaccine - declined due to cost  Zostavax - declined due to cost  Screening mammography - has appt 06/2014  Bone densitometry screening - referral made  Colorectal cancer screening - UTD  Diabetes screening - will continue to check FBG and A1c  Glaucoma screening - recommended follow up with Dr Truman Hayward  Nutrition counseling - discussed in office.  Patient declined referral to nutritionist  Advanced directives: Caring connections packet given in office today  Discussed limiting CHO and calories in diet.  Handout given.  Continue daily exercise (which she just started about 3 weeks ago)  Diet review for nutrition referral? Patient refused   Patient Instructions (the written plan) was given to the patient.  Medicare Attestation I have personally reviewed: The patient's medical and social history Their use of alcohol, tobacco or illicit drugs Their current medications and supplements The patient's functional ability including ADLs,fall risks, home safety risks, cognitive, and hearing and visual impairment Diet and physical activities Evidence for depression or mood disorders  The patient's weight, height, BMI, and BP/HR have been recorded in the chart.  I have made referrals, counseling, and provided education to the patient based on review of the above and I have provided the patient with a written personalized care plan for preventive services.     Cherre Robins, [facility]   06/23/2014

## 2014-06-23 NOTE — Patient Instructions (Addendum)
Health Maintenance Summary    INFLUENZA VACCINE Next Due 06/03/2014  Will get in Fall 2015    TETANUS/TDAP Postponed 07/06/2014 Postponed due to cost - $45 verified today    MAMMOGRAM Postponed 12/20/2014 Has appointment end of August 2015    ZOSTAVAX Postponed 12/20/2014 Postponed due to cost - $65 verified today   Pneumonia Vaccine Completed  Last done 02/02/2014   Eye Exam Due now     DEXA / Bone density Due now  Referral sent today    COLONOSCOPY Next Due 03/22/2024  Last done 03/22/2014       Preventive Care for Adults A healthy lifestyle and preventive care can promote health and wellness. Preventive health guidelines for women include the following key practices.  A routine yearly physical is a good way to check with your health care provider about your health and preventive screening. It is a chance to share any concerns and updates on your health and to receive a thorough exam.  Visit your dentist for a routine exam and preventive care every 6 months. Brush your teeth twice a day and floss once a day. Good oral hygiene prevents tooth decay and gum disease.  The frequency of eye exams is based on your age, health, family medical history, use of contact lenses, and other factors. Follow your health care provider's recommendations for frequency of eye exams.  Eat a healthy diet. Foods like vegetables, fruits, whole grains, low-fat dairy products, and lean protein foods contain the nutrients you need without too many calories. Decrease your intake of foods high in solid fats, added sugars, and salt. Eat the right amount of calories for you.Get information about a proper diet from your health care provider, if necessary.  Regular physical exercise is one of the most important things you can do for your health. Most adults should get at least 150 minutes of moderate-intensity exercise (any activity that increases your heart rate and causes you to sweat) each week. In addition, most adults need  muscle-strengthening exercises on 2 or more days a week.  Maintain a healthy weight. The body mass index (BMI) is a screening tool to identify possible weight problems. It provides an estimate of body fat based on height and weight. Your health care provider can find your BMI and can help you achieve or maintain a healthy weight.For adults 20 years and older:  A BMI below 18.5 is considered underweight.  A BMI of 18.5 to 24.9 is normal.  A BMI of 25 to 29.9 is considered overweight.  A BMI of 30 and above is considered obese.  Maintain normal blood lipids and cholesterol levels by exercising and minimizing your intake of saturated fat. Eat a balanced diet with plenty of fruit and vegetables. Blood tests for lipids and cholesterol should begin at age 58 and be repeated every 5 years. If your lipid or cholesterol levels are high, you are over 50, or you are at high risk for heart disease, you may need your cholesterol levels checked more frequently.Ongoing high lipid and cholesterol levels should be treated with medicines if diet and exercise are not working.  If you smoke, find out from your health care provider how to quit. If you do not use tobacco, do not start.  Lung cancer screening is recommended for adults aged 27-80 years who are at high risk for developing lung cancer because of a history of smoking. A yearly low-dose CT scan of the lungs is recommended for people who have at least  a 30-pack-year history of smoking and are a current smoker or have quit within the past 15 years. A pack year of smoking is smoking an average of 1 pack of cigarettes a day for 1 year (for example: 1 pack a day for 30 years or 2 packs a day for 15 years). Yearly screening should continue until the smoker has stopped smoking for at least 15 years. Yearly screening should be stopped for people who develop a health problem that would prevent them from having lung cancer treatment.  If you are pregnant, do not  drink alcohol. If you are breastfeeding, be very cautious about drinking alcohol. If you are not pregnant and choose to drink alcohol, do not have more than 1 drink per day. One drink is considered to be 12 ounces (355 mL) of beer, 5 ounces (148 mL) of wine, or 1.5 ounces (44 mL) of liquor.  Avoid use of street drugs. Do not share needles with anyone. Ask for help if you need support or instructions about stopping the use of drugs.  High blood pressure causes heart disease and increases the risk of stroke. Your blood pressure should be checked at least every 1 to 2 years. Ongoing high blood pressure should be treated with medicines if weight loss and exercise do not work.  If you are 39-19 years old, ask your health care provider if you should take aspirin to prevent strokes.  Diabetes screening involves taking a blood sample to check your fasting blood sugar level. This should be done once every 3 years, after age 78, if you are within normal weight and without risk factors for diabetes. Testing should be considered at a younger age or be carried out more frequently if you are overweight and have at least 1 risk factor for diabetes.  Breast cancer screening is essential preventive care for women. You should practice "breast self-awareness." This means understanding the normal appearance and feel of your breasts and may include breast self-examination. Any changes detected, no matter how small, should be reported to a health care provider. Women in their 57s and 30s should have a clinical breast exam (CBE) by a health care provider as part of a regular health exam every 1 to 3 years. After age 24, women should have a CBE every year. Starting at age 56, women should consider having a mammogram (breast X-ray test) every year. Women who have a family history of breast cancer should talk to their health care provider about genetic screening. Women at a high risk of breast cancer should talk to their health  care providers about having an MRI and a mammogram every year.  Breast cancer gene (BRCA)-related cancer risk assessment is recommended for women who have family members with BRCA-related cancers. BRCA-related cancers include breast, ovarian, tubal, and peritoneal cancers. Having family members with these cancers may be associated with an increased risk for harmful changes (mutations) in the breast cancer genes BRCA1 and BRCA2. Results of the assessment will determine the need for genetic counseling and BRCA1 and BRCA2 testing.  Routine pelvic exams to screen for cancer are no longer recommended for nonpregnant women who are considered low risk for cancer of the pelvic organs (ovaries, uterus, and vagina) and who do not have symptoms. Ask your health care provider if a screening pelvic exam is right for you.  If you have had past treatment for cervical cancer or a condition that could lead to cancer, you need Pap tests and screening for cancer for  at least 20 years after your treatment. If Pap tests have been discontinued, your risk factors (such as having a new sexual partner) need to be reassessed to determine if screening should be resumed. Some women have medical problems that increase the chance of getting cervical cancer. In these cases, your health care provider may recommend more frequent screening and Pap tests.  The HPV test is an additional test that may be used for cervical cancer screening. The HPV test looks for the virus that can cause the cell changes on the cervix. The cells collected during the Pap test can be tested for HPV. The HPV test could be used to screen women aged 56 years and older, and should be used in women of any age who have unclear Pap test results. After the age of 25, women should have HPV testing at the same frequency as a Pap test.  Colorectal cancer can be detected and often prevented. Most routine colorectal cancer screening begins at the age of 6 years and  continues through age 62 years. However, your health care provider may recommend screening at an earlier age if you have risk factors for colon cancer. On a yearly basis, your health care provider may provide home test kits to check for hidden blood in the stool. Use of a small camera at the end of a tube, to directly examine the colon (sigmoidoscopy or colonoscopy), can detect the earliest forms of colorectal cancer. Talk to your health care provider about this at age 59, when routine screening begins. Direct exam of the colon should be repeated every 5-10 years through age 107 years, unless early forms of pre-cancerous polyps or small growths are found.  People who are at an increased risk for hepatitis B should be screened for this virus. You are considered at high risk for hepatitis B if:  You were born in a country where hepatitis B occurs often. Talk with your health care provider about which countries are considered high risk.  Your parents were born in a high-risk country and you have not received a shot to protect against hepatitis B (hepatitis B vaccine).  You have HIV or AIDS.  You use needles to inject street drugs.  You live with, or have sex with, someone who has hepatitis B.  You get hemodialysis treatment.  You take certain medicines for conditions like cancer, organ transplantation, and autoimmune conditions.  Hepatitis C blood testing is recommended for all people born from [birth year] through 1965 and any individual with known risks for hepatitis C.  Practice safe sex. Use condoms and avoid high-risk sexual practices to reduce the spread of sexually transmitted infections (STIs). STIs include gonorrhea, chlamydia, syphilis, trichomonas, herpes, HPV, and human immunodeficiency virus (HIV). Herpes, HIV, and HPV are viral illnesses that have no cure. They can result in disability, cancer, and death.  You should be screened for sexually transmitted illnesses (STIs) including gonorrhea  and chlamydia if:  You are sexually active and are younger than 24 years.  You are older than 24 years and your health care provider tells you that you are at risk for this type of infection.  Your sexual activity has changed since you were last screened and you are at an increased risk for chlamydia or gonorrhea. Ask your health care provider if you are at risk.  If you are at risk of being infected with HIV, it is recommended that you take a prescription medicine daily to prevent HIV infection. This is called preexposure  prophylaxis (PrEP). You are considered at risk if:  You are a heterosexual woman, are sexually active, and are at increased risk for HIV infection.  You take drugs by injection.  You are sexually active with a partner who has HIV.  Talk with your health care provider about whether you are at high risk of being infected with HIV. If you choose to begin PrEP, you should first be tested for HIV. You should then be tested every 3 months for as long as you are taking PrEP.  Osteoporosis is a disease in which the bones lose minerals and strength with aging. This can result in serious bone fractures or breaks. The risk of osteoporosis can be identified using a bone density scan. Women ages 40 years and over and women at risk for fractures or osteoporosis should discuss screening with their health care providers. Ask your health care provider whether you should take a calcium supplement or vitamin D to reduce the rate of osteoporosis.  Menopause can be associated with physical symptoms and risks. Hormone replacement therapy is available to decrease symptoms and risks. You should talk to your health care provider about whether hormone replacement therapy is right for you.  Use sunscreen. Apply sunscreen liberally and repeatedly throughout the day. You should seek shade when your shadow is shorter than you. Protect yourself by wearing long sleeves, pants, a wide-brimmed hat, and  sunglasses year round, whenever you are outdoors.  Once a month, do a whole body skin exam, using a mirror to look at the skin on your back. Tell your health care provider of new moles, moles that have irregular borders, moles that are larger than a pencil eraser, or moles that have changed in shape or color.  Stay current with required vaccines (immunizations).  Influenza vaccine. All adults should be immunized every year.  Tetanus, diphtheria, and acellular pertussis (Td, Tdap) vaccine. Pregnant women should receive 1 dose of Tdap vaccine during each pregnancy. The dose should be obtained regardless of the length of time since the last dose. Immunization is preferred during the 27th-36th week of gestation. An adult who has not previously received Tdap or who does not know her vaccine status should receive 1 dose of Tdap. This initial dose should be followed by tetanus and diphtheria toxoids (Td) booster doses every 10 years. Adults with an unknown or incomplete history of completing a 3-dose immunization series with Td-containing vaccines should begin or complete a primary immunization series including a Tdap dose. Adults should receive a Td booster every 10 years.  Varicella vaccine. An adult without evidence of immunity to varicella should receive 2 doses or a second dose if she has previously received 1 dose. Pregnant females who do not have evidence of immunity should receive the first dose after pregnancy. This first dose should be obtained before leaving the health care facility. The second dose should be obtained 4-8 weeks after the first dose.  Human papillomavirus (HPV) vaccine. Females aged 13-26 years who have not received the vaccine previously should obtain the 3-dose series. The vaccine is not recommended for use in pregnant females. However, pregnancy testing is not needed before receiving a dose. If a female is found to be pregnant after receiving a dose, no treatment is needed. In that  case, the remaining doses should be delayed until after the pregnancy. Immunization is recommended for any person with an immunocompromised condition through the age of 40 years if she did not get any or all doses earlier. During  the 3-dose series, the second dose should be obtained 4-8 weeks after the first dose. The third dose should be obtained 24 weeks after the first dose and 16 weeks after the second dose.  Zoster vaccine. One dose is recommended for adults aged 32 years or older unless certain conditions are present.  Measles, mumps, and rubella (MMR) vaccine. Adults born before [birth year] generally are considered immune to measles and mumps. Adults born in [birth year] or later should have 1 or more doses of MMR vaccine unless there is a contraindication to the vaccine or there is laboratory evidence of immunity to each of the three diseases. A routine second dose of MMR vaccine should be obtained at least 28 days after the first dose for students attending postsecondary schools, health care workers, or international travelers. People who received inactivated measles vaccine or an unknown type of measles vaccine during 1963-1967 should receive 2 doses of MMR vaccine. People who received inactivated mumps vaccine or an unknown type of mumps vaccine before 1979 and are at high risk for mumps infection should consider immunization with 2 doses of MMR vaccine. For females of childbearing age, rubella immunity should be determined. If there is no evidence of immunity, females who are not pregnant should be vaccinated. If there is no evidence of immunity, females who are pregnant should delay immunization until after pregnancy. Unvaccinated health care workers born before [birth year] who lack laboratory evidence of measles, mumps, or rubella immunity or laboratory confirmation of disease should consider measles and mumps immunization with 2 doses of MMR vaccine or rubella immunization with 1 dose of MMR vaccine.  Pneumococcal  13-valent conjugate (PCV13) vaccine. When indicated, a person who is uncertain of her immunization history and has no record of immunization should receive the PCV13 vaccine. An adult aged 61 years or older who has certain medical conditions and has not been previously immunized should receive 1 dose of PCV13 vaccine. This PCV13 should be followed with a dose of pneumococcal polysaccharide (PPSV23) vaccine. The PPSV23 vaccine dose should be obtained at least 8 weeks after the dose of PCV13 vaccine. An adult aged 80 years or older who has certain medical conditions and previously received 1 or more doses of PPSV23 vaccine should receive 1 dose of PCV13. The PCV13 vaccine dose should be obtained 1 or more years after the last PPSV23 vaccine dose.  Pneumococcal polysaccharide (PPSV23) vaccine. When PCV13 is also indicated, PCV13 should be obtained first. All adults aged 14 years and older should be immunized. An adult younger than age 27 years who has certain medical conditions should be immunized. Any person who resides in a nursing home or long-term care facility should be immunized. An adult smoker should be immunized. People with an immunocompromised condition and certain other conditions should receive both PCV13 and PPSV23 vaccines. People with human immunodeficiency virus (HIV) infection should be immunized as soon as possible after diagnosis. Immunization during chemotherapy or radiation therapy should be avoided. Routine use of PPSV23 vaccine is not recommended for American Indians, [place]atives, or people younger than 65 years unless there are medical conditions that require PPSV23 vaccine. When indicated, people who have unknown immunization and have no record of immunization should receive PPSV23 vaccine. One-time revaccination 5 years after the first dose of PPSV23 is recommended for people aged 19-64 years who have chronic kidney failure, nephrotic syndrome, asplenia, or immunocompromised conditions.  People who received 1-2 doses of PPSV23 before age 3 years should receive another dose of PPSV23 vaccine at  age 60 years or later if at least 5 years have passed since the previous dose. Doses of PPSV23 are not needed for people immunized with PPSV23 at or after age 28 years.  Meningococcal vaccine. Adults with asplenia or persistent complement component deficiencies should receive 2 doses of quadrivalent meningococcal conjugate (MenACWY-D) vaccine. The doses should be obtained at least 2 months apart. Microbiologists working with certain meningococcal bacteria, [employer] recruits, people at risk during an outbreak, and people who travel to or live in countries with a high rate of meningitis should be immunized. A first-year college student up through age 31 years who is living in a residence hall should receive a dose if she did not receive a dose on or after her 16th birthday. Adults who have certain high-risk conditions should receive one or more doses of vaccine.  Hepatitis A vaccine. Adults who wish to be protected from this disease, have certain high-risk conditions, work with hepatitis A-infected animals, work in hepatitis A research labs, or travel to or work in countries with a high rate of hepatitis A should be immunized. Adults who were previously unvaccinated and who anticipate close contact with an international adoptee during the first 60 days after arrival in the [place] from a country with a high rate of hepatitis A should be immunized.  Hepatitis B vaccine. Adults who wish to be protected from this disease, have certain high-risk conditions, may be exposed to blood or other infectious body fluids, are household contacts or sex partners of hepatitis B positive people, are clients or workers in certain care facilities, or travel to or work in countries with a high rate of hepatitis B should be immunized.  Ages 52 years and over  Blood pressure check.** / Every 1 to 2 years.  Lipid  and cholesterol check.** / Every 5 years beginning at age 3 years.  Lung cancer screening. / Every year if you are aged 35-80 years and have a 30-pack-year history of smoking and currently smoke or have quit within the past 15 years. Yearly screening is stopped once you have quit smoking for at least 15 years or develop a health problem that would prevent you from having lung cancer treatment.  Clinical breast exam.** / Every year after age 68 years.  BRCA-related cancer risk assessment.** / For women who have family members with a BRCA-related cancer (breast, ovarian, tubal, or peritoneal cancers).  Mammogram.** / Every year beginning at age 19 years and continuing for as long as you are in good health. Consult with your health care provider.  Pap test.** / Every 3 years starting at age 86 years through age 67 or 79 years with 3 consecutive normal Pap tests. Testing can be stopped between 65 and 70 years with 3 consecutive normal Pap tests and no abnormal Pap or HPV tests in the past 10 years.  HPV screening.** / Every 3 years from ages 35 years through ages 77 or 66 years with a history of 3 consecutive normal Pap tests. Testing can be stopped between 65 and 70 years with 3 consecutive normal Pap tests and no abnormal Pap or HPV tests in the past 10 years.  Fecal occult blood test (FOBT) of stool. / Every year beginning at age 14 years and continuing until age 53 years. You may not need to do this test if you get a colonoscopy every 10 years.  Flexible sigmoidoscopy or colonoscopy.** / Every 5 years for a flexible sigmoidoscopy or every 10 years for a  colonoscopy beginning at age 51 years and continuing until age 12 years.  Hepatitis C blood test.** / For all people born from [birth year] through 1967 and any individual with known risks for hepatitis C.  Osteoporosis screening.** / A one-time screening for women ages 73 years and over and women at risk for fractures or osteoporosis.  Skin  self-exam. / Monthly.  Influenza vaccine. / Every year.  Tetanus, diphtheria, and acellular pertussis (Tdap/Td) vaccine.** / 1 dose of Td every 10 years.  Varicella vaccine.** / Consult your health care provider.  Zoster vaccine.** / 1 dose for adults aged 18 years or older.  Pneumococcal 13-valent conjugate (PCV13) vaccine.** / Consult your health care provider.  Pneumococcal polysaccharide (PPSV23) vaccine.** / 1 dose for all adults aged 41 years and older.  Meningococcal vaccine.** / Consult your health care provider.  Hepatitis A vaccine.** / Consult your health care provider.  Hepatitis B vaccine.** / Consult your health care provider.  Haemophilus influenzae type b (Hib) vaccine.** / Consult your health care provider. ** Family history and personal history of risk and conditions may change your health care provider's recommendations. Document Released: 12/16/2001 Document Revised: 03/06/2014 Document Reviewed: 03/17/2011 [facility] Patient Information 2015 [place], [place]. This information is not intended to replace advice given to you by your health care provider. Make sure you discuss any questions you have with your health care provider.

## 2014-07-04 ENCOUNTER — Ambulatory Visit (INDEPENDENT_AMBULATORY_CARE_PROVIDER_SITE_OTHER): Payer: Medicare Other | Admitting: Nurse Practitioner

## 2014-07-04 ENCOUNTER — Ambulatory Visit (INDEPENDENT_AMBULATORY_CARE_PROVIDER_SITE_OTHER): Payer: Medicare Other

## 2014-07-04 VITALS — BP 133/79 | HR 76 | Temp 99.5°F | Ht 62.0 in | Wt 186.0 lb

## 2014-07-04 DIAGNOSIS — R0602 Shortness of breath: Secondary | ICD-10-CM

## 2014-07-04 DIAGNOSIS — J209 Acute bronchitis, unspecified: Secondary | ICD-10-CM

## 2014-07-04 MED ORDER — METHYLPREDNISOLONE ACETATE 80 MG/ML IJ SUSP
80.0000 mg | Freq: Once | INTRAMUSCULAR | Status: AC
  Administered 2014-07-04: 80 mg via INTRAMUSCULAR

## 2014-07-04 MED ORDER — AMOXICILLIN 875 MG PO TABS: 875.0000 mg | ORAL_TABLET | Freq: Two times a day (BID) | ORAL | Status: DC

## 2014-07-04 NOTE — Patient Instructions (Signed)

## 2014-07-04 NOTE — Progress Notes (Signed)
   Subjective:    Patient ID: Charlotte Aguirre, female    DOB: Jul 21, 1946, 68 y.o.   MRN: [id number]  HPI Patient in today c/o cough, SOB- Started about 1 week ago and has gotten worse. SHe has taken delsym OTC with some relief.    Review of Systems  Constitutional: Positive for fever (low grade fever), chills and appetite change.  HENT: Positive for congestion and voice change. Negative for sinus pressure, sore throat and trouble swallowing.   Respiratory: Positive for cough and shortness of breath.   Cardiovascular: Negative.   Genitourinary: Negative.   Neurological: Negative.   Psychiatric/Behavioral: Negative.        Objective:   Physical Exam  Constitutional: She is oriented to person, place, and time. She appears well-developed and well-nourished.  HENT:  Right Ear: Hearing, tympanic membrane, external ear and ear canal normal.  Left Ear: Hearing, tympanic membrane, external ear and ear canal normal.  Nose: Mucosal edema and rhinorrhea present. Right sinus exhibits no maxillary sinus tenderness and no frontal sinus tenderness. Left sinus exhibits no maxillary sinus tenderness and no frontal sinus tenderness.  Mouth/Throat: Uvula is midline, oropharynx is clear and moist and mucous membranes are normal.  Cardiovascular: Normal rate, regular rhythm and normal heart sounds.   Pulmonary/Chest: Effort normal. She has wheezes (exp in bil bases).  Neurological: She is alert and oriented to person, place, and time.  Skin: Skin is warm and dry.  Psychiatric: She has a normal mood and affect. Her behavior is normal. Judgment and thought content normal.   BP 133/79  Pulse 76  Temp(Src) 99.5 F (37.5 C) (Oral)  Ht 5\' 2"  (1.575 m)  Wt 186 lb (84.369 kg)  BMI 34.01 kg/m2  Chest x ray- bronchitic changes-Preliminary reading by Ronnald Collum, FNP  [facility]  EKG- Kerry Hough, FNP        Assessment & Plan:   1. SOB (shortness of breath)   2. Acute bronchitis, unspecified  organism    Meds ordered this encounter  Medications  . methylPREDNISolone acetate (DEPO-MEDROL) injection 80 mg    Sig:   . amoxicillin (AMOXIL) 875 MG tablet    Sig: Take 1 tablet (875 mg total) by mouth 2 (two) times daily.    Dispense:  20 tablet    Refill:  0    Order Specific Question:  Supervising Provider    Answer:  Chipper Herb [[id number]]   1. Take meds as prescribed 2. Use a cool mist humidifier especially during the winter months and when heat has been humid. 3. Use saline nose sprays frequently 4. Saline irrigations of the nose can be very helpful if done frequently.  * 4X daily for 1 week*  * Use of a nettie pot can be helpful with this. Follow directions with this* 5. Drink plenty of fluids 6. Keep thermostat turn down low 7.For any cough or congestion  Use plain Mucinex- regular strength or max strength is fine   * Children- consult with Pharmacist for dosing 8. For fever or aces or pains- take tylenol or ibuprofen appropriate for age and weight.  * for fevers greater than 101 orally you may alternate ibuprofen and tylenol every  3 hours.   Nayleah-Margaret Hassell Done, FNP

## 2014-07-06 ENCOUNTER — Encounter: Payer: Self-pay | Admitting: *Deleted

## 2014-07-28 ENCOUNTER — Encounter: Payer: Self-pay | Admitting: Family Medicine

## 2014-07-28 ENCOUNTER — Ambulatory Visit (INDEPENDENT_AMBULATORY_CARE_PROVIDER_SITE_OTHER): Payer: Medicare Other | Admitting: Family Medicine

## 2014-07-28 VITALS — BP 123/60 | HR 60 | Temp 97.2°F | Ht 62.0 in | Wt 180.0 lb

## 2014-07-28 DIAGNOSIS — R05 Cough: Secondary | ICD-10-CM

## 2014-07-28 DIAGNOSIS — J029 Acute pharyngitis, unspecified: Secondary | ICD-10-CM

## 2014-07-28 DIAGNOSIS — R059 Cough, unspecified: Secondary | ICD-10-CM

## 2014-07-28 DIAGNOSIS — J069 Acute upper respiratory infection, unspecified: Secondary | ICD-10-CM

## 2014-07-28 LAB — POCT INFLUENZA A/B
Influenza A, POC: NEGATIVE
Influenza B, POC: NEGATIVE

## 2014-07-28 LAB — POCT RAPID STREP A (OFFICE): Rapid Strep A Screen: NEGATIVE

## 2014-07-28 MED ORDER — AZITHROMYCIN 250 MG PO TABS: ORAL_TABLET | ORAL | Status: DC

## 2014-07-28 MED ORDER — FLUTICASONE PROPIONATE 50 MCG/ACT NA SUSP: 2.0000 | Freq: Every day | NASAL | Status: DC

## 2014-07-28 NOTE — Progress Notes (Signed)
   Subjective:    Patient ID: Charlotte Aguirre, female    DOB: May 29, 1946, 68 y.o.   MRN: [id number]  HPI C/o sore throat and uri sx's for over a week.   Review of Systems C/o uri No chest pain, SOB, HA, dizziness, vision change, N/V, diarrhea, constipation, dysuria, urinary urgency or frequency, myalgias, arthralgias or rash.     Objective:   Physical Exam  Vital signs noted  Well developed well nourished female.  HEENT - Head atraumatic Normocephalic                Eyes - PERRLA, Conjuctiva - clear Sclera- Clear EOMI                Ears - EAC's Wnl TM's Wnl Gross Hearing WNL                Throat - oropharanx injected Respiratory - Lungs CTA bilateral Cardiac - RRR S1 and S2 without murmur GI - Abdomen soft Nontender and bowel sounds active x4   Results for orders placed in visit on 07/28/14  POCT RAPID STREP A (OFFICE)      Result Value Ref Range   Rapid Strep A Screen Negative  Negative  POCT INFLUENZA A/B      Result Value Ref Range   Influenza A, POC Negative     Influenza B, POC Negative         Assessment & Plan:  Sore throat - Plan: POCT rapid strep A, POCT Influenza A/B, azithromycin (ZITHROMAX) 250 MG tablet, fluticasone (FLONASE) 50 MCG/ACT nasal spray  Cough - Plan: POCT rapid strep A, POCT Influenza A/B, azithromycin (ZITHROMAX) 250 MG tablet, fluticasone (FLONASE) 50 MCG/ACT nasal spray  URI (upper respiratory infection) - Plan: azithromycin (ZITHROMAX) 250 MG tablet, fluticasone (FLONASE) 50 MCG/ACT nasal spray  Push po fluids, rest, tylenol and motrin otc prn as directed for fever, arthralgias, and myalgias.  Follow up prn if sx's continue or persist.  Lysbeth Penner FNP

## 2014-08-01 ENCOUNTER — Ambulatory Visit: Payer: Medicare Other

## 2014-08-07 ENCOUNTER — Other Ambulatory Visit: Payer: Self-pay | Admitting: *Deleted

## 2014-08-07 MED ORDER — HYDROCHLOROTHIAZIDE 25 MG PO TABS: 25.0000 mg | ORAL_TABLET | Freq: Every day | ORAL | Status: DC

## 2014-08-18 ENCOUNTER — Encounter: Payer: Self-pay | Admitting: Family Medicine

## 2014-08-18 ENCOUNTER — Ambulatory Visit (INDEPENDENT_AMBULATORY_CARE_PROVIDER_SITE_OTHER): Payer: Medicare Other | Admitting: Family Medicine

## 2014-08-18 VITALS — BP 102/57 | HR 87 | Temp 97.7°F | Ht 62.0 in | Wt 179.4 lb

## 2014-08-18 DIAGNOSIS — M5431 Sciatica, right side: Secondary | ICD-10-CM

## 2014-08-18 MED ORDER — KETOROLAC TROMETHAMINE 60 MG/2ML IM SOLN
60.0000 mg | Freq: Once | INTRAMUSCULAR | Status: AC
  Administered 2014-08-18: 60 mg via INTRAMUSCULAR

## 2014-08-18 MED ORDER — PREGABALIN 100 MG PO CAPS: 100.0000 mg | ORAL_CAPSULE | Freq: Two times a day (BID) | ORAL | Status: DC

## 2014-08-18 MED ORDER — METHYLPREDNISOLONE (PAK) 4 MG PO TABS: ORAL_TABLET | ORAL | Status: DC

## 2014-08-18 NOTE — Progress Notes (Signed)
   Subjective:    Patient ID: Charlotte Aguirre, female    DOB: [birth year], 68 y.o.   MRN: [id number]  HPI This 68 y.o. female presents for evaluation of c/o right buttock pain radiating down right leg to right knee.   Review of Systems    No chest pain, SOB, HA, dizziness, vision change, N/V, diarrhea, constipation, dysuria, urinary urgency or frequency, myalgias, arthralgias or rash.  Objective:   Physical Exam Vital signs noted  Well developed well nourished female.  HEENT - Head atraumatic Normocephalic                Eyes - PERRLA, Conjuctiva - clear Sclera- Clear EOMI                Ears - EAC's Wnl TM's Wnl Gross Hearing WNL                Nose - Nares patent                 Throat - oropharanx wnl Respiratory - Lungs CTA bilateral Cardiac - RRR S1 and S2 without murmur GI - Abdomen soft Nontender and bowel sounds active x 4 Extremities - No edema. Neuro - Grossly intact.       Assessment & Plan:  Sciatica, right - Plan: methylPREDNIsolone (MEDROL DOSPACK) 4 MG tablet, pregabalin (LYRICA) 100 MG capsule, ketorolac (TORADOL) injection 60 mg  Lysbeth Penner FNP

## 2014-08-24 ENCOUNTER — Telehealth: Payer: Self-pay | Admitting: Family Medicine

## 2014-08-24 NOTE — Telephone Encounter (Signed)
Bill please look at

## 2014-08-25 ENCOUNTER — Other Ambulatory Visit: Payer: Self-pay | Admitting: Family Medicine

## 2014-08-25 MED ORDER — CYCLOBENZAPRINE HCL 5 MG PO TABS: 5.0000 mg | ORAL_TABLET | Freq: Three times a day (TID) | ORAL | Status: DC | PRN

## 2014-08-25 NOTE — Telephone Encounter (Signed)
Sent flexeril to pharmacy 

## 2014-08-25 NOTE — Telephone Encounter (Signed)
Pt aware med at pharm -- VM

## 2014-09-11 ENCOUNTER — Other Ambulatory Visit: Payer: Self-pay

## 2014-09-11 MED ORDER — GABAPENTIN 300 MG PO CAPS: 600.0000 mg | ORAL_CAPSULE | Freq: Three times a day (TID) | ORAL | Status: DC

## 2014-09-13 ENCOUNTER — Ambulatory Visit (INDEPENDENT_AMBULATORY_CARE_PROVIDER_SITE_OTHER): Payer: Medicare Other

## 2014-09-13 ENCOUNTER — Ambulatory Visit (INDEPENDENT_AMBULATORY_CARE_PROVIDER_SITE_OTHER): Payer: Medicare Other | Admitting: Pharmacist

## 2014-09-13 ENCOUNTER — Encounter: Payer: Self-pay | Admitting: Pharmacist

## 2014-09-13 VITALS — Ht 61.25 in | Wt 179.0 lb

## 2014-09-13 DIAGNOSIS — M8580 Other specified disorders of bone density and structure, unspecified site: Secondary | ICD-10-CM | POA: Insufficient documentation

## 2014-09-13 DIAGNOSIS — Z13820 Encounter for screening for osteoporosis: Secondary | ICD-10-CM

## 2014-09-13 LAB — HM DEXA SCAN

## 2014-09-13 NOTE — Patient Instructions (Signed)

## 2014-09-13 NOTE — Progress Notes (Signed)
Patient ID: Charlotte Aguirre, female   DOB: [birth year], 68 y.o.   MRN: [id number]  [facility] Current Height: Height: 5' 1.25" (155.6 cm)      Max Lifetime Height:  5\' 2"  Current Weight: Weight: 179 lb (81.194 kg)       Ethnicity:Caucasian    HPI: Does pt already have a diagnosis of:  Osteopenia?  Yes Osteoporosis?  No  Back Pain?  No       Kyphosis?  No Prior fracture?  Yes - WRIST IN 2013 Med(s) for Osteoporosis/Osteopenia:  Calcium 500mg  bid Med(s) previously tried for Osteoporosis/Osteopenia:  none                                                             PMH: Age at menopause:  68yo Hysterectomy?  Yes Oophorectomy?  Yes HRT? No Steroid Use?  No Thyroid med?  No History of cancer?  No History of digestive disorders (ie Crohn's)?  Yes - on chronic PPI for GERD Current or previous eating disorders?  No Last Vitamin D Result:  35.1 (02/02/2014) Last Serum Creatine - 0.73 (05/10/2014)   FH/SH: Family history of osteoporosis?  Yes - sister Parent with history of hip fracture?  No Family history of breast cancer?  No Exercise?  No Smoking?  No Alcohol?  No    Calcium Assessment Calcium Intake  # of servings/day  Calcium mg  Milk (8 oz) 0  x  300  = 0  Yogurt (4 oz) 0.5 x  200 = 100mg   Cheese (1 oz) 0.5 x  200 = 100mg   Other Calcium sources   250mg   Ca supplement 500mg  bid = 1000mg    Estimated calcium intake per day 1450mg     DEXA Results Date of Test T-Score for AP Spine L1-L4 T-Score for Total Left Hip T-Score for Total Right Hip  09/13/2014 -0.9 -0.6 -0.9  08/08/2009 -1.5 -0.7 -1.1  04/15/2004 -1.4 -0.8 --  11/02/1997 -0.9 -0.9 --   FRAX 10 year estimate: Total FX risk:  17%  (consider medication if >/= 20%) Hip FX risk:  2.7%  (consider medication if >/= 3%)  Assessment: Osteopenia with improved BMD since last checked  Recommendations: 1.  Discussed DEXA results and fracture risk 2.  continue calcium 1200mg  daily through supplementation or diet.   3.  recommend weight bearing exercise - 30 minutes at least 4 days per week.   4.  Counseled and educated about fall risk and prevention.  Recheck DEXA:  2 years  Time spent counseling patient:  30 minutes   Cherre Robins, PharmD, CPP

## 2014-09-20 ENCOUNTER — Ambulatory Visit: Payer: Medicare Other | Admitting: Family Medicine

## 2014-09-21 ENCOUNTER — Ambulatory Visit: Payer: Medicare Other | Admitting: Family Medicine

## 2014-10-31 ENCOUNTER — Encounter: Payer: Self-pay | Admitting: Nurse Practitioner

## 2014-10-31 ENCOUNTER — Ambulatory Visit (INDEPENDENT_AMBULATORY_CARE_PROVIDER_SITE_OTHER): Payer: Medicare Other | Admitting: Nurse Practitioner

## 2014-10-31 VITALS — BP 127/73 | HR 78 | Temp 97.7°F | Ht 61.0 in | Wt 178.0 lb

## 2014-10-31 DIAGNOSIS — M791 Myalgia: Secondary | ICD-10-CM

## 2014-10-31 DIAGNOSIS — R7303 Prediabetes: Secondary | ICD-10-CM

## 2014-10-31 DIAGNOSIS — I10 Essential (primary) hypertension: Secondary | ICD-10-CM

## 2014-10-31 DIAGNOSIS — Z01419 Encounter for gynecological examination (general) (routine) without abnormal findings: Secondary | ICD-10-CM

## 2014-10-31 DIAGNOSIS — Z0000 Encounter for general adult medical examination without abnormal findings: Secondary | ICD-10-CM

## 2014-10-31 DIAGNOSIS — K589 Irritable bowel syndrome without diarrhea: Secondary | ICD-10-CM

## 2014-10-31 DIAGNOSIS — N941 Dyspareunia: Secondary | ICD-10-CM

## 2014-10-31 DIAGNOSIS — E669 Obesity, unspecified: Secondary | ICD-10-CM

## 2014-10-31 DIAGNOSIS — IMO0001 Reserved for inherently not codable concepts without codable children: Secondary | ICD-10-CM

## 2014-10-31 DIAGNOSIS — K219 Gastro-esophageal reflux disease without esophagitis: Secondary | ICD-10-CM

## 2014-10-31 DIAGNOSIS — M609 Myositis, unspecified: Secondary | ICD-10-CM

## 2014-10-31 DIAGNOSIS — G500 Trigeminal neuralgia: Secondary | ICD-10-CM

## 2014-10-31 DIAGNOSIS — R7309 Other abnormal glucose: Secondary | ICD-10-CM

## 2014-10-31 DIAGNOSIS — N816 Rectocele: Secondary | ICD-10-CM

## 2014-10-31 DIAGNOSIS — IMO0002 Reserved for concepts with insufficient information to code with codable children: Secondary | ICD-10-CM

## 2014-10-31 DIAGNOSIS — E785 Hyperlipidemia, unspecified: Secondary | ICD-10-CM

## 2014-10-31 DIAGNOSIS — R102 Pelvic and perineal pain: Secondary | ICD-10-CM

## 2014-10-31 DIAGNOSIS — M25559 Pain in unspecified hip: Secondary | ICD-10-CM

## 2014-10-31 DIAGNOSIS — G609 Hereditary and idiopathic neuropathy, unspecified: Secondary | ICD-10-CM

## 2014-10-31 LAB — POCT URINALYSIS DIPSTICK
Bilirubin, UA: NEGATIVE
Glucose, UA: NEGATIVE
Ketones, UA: NEGATIVE
Nitrite, UA: NEGATIVE
Protein, UA: NEGATIVE
Spec Grav, UA: 1.01
Urobilinogen, UA: NEGATIVE
pH, UA: 7

## 2014-10-31 LAB — POCT CBC
Granulocyte percent: 57 %G (ref 37–80)
HCT, POC: 43.8 % (ref 37.7–47.9)
Hemoglobin: 13.8 g/dL (ref 12.2–16.2)
Lymph, poc: 3.7 — AB (ref 0.6–3.4)
MCH, POC: 25.9 pg — AB (ref 27–31.2)
MCHC: 31.4 g/dL — AB (ref 31.8–35.4)
MCV: 82.5 fL (ref 80–97)
MPV: 8.9 fL (ref 0–99.8)
POC Granulocyte: 5.4 (ref 2–6.9)
POC LYMPH PERCENT: 39.1 %L (ref 10–50)
Platelet Count, POC: 275 10*3/uL (ref 142–424)
RBC: 5.3 M/uL (ref 4.04–5.48)
RDW, POC: 13.8 %
WBC: 9.4 10*3/uL (ref 4.6–10.2)

## 2014-10-31 LAB — POCT UA - MICROSCOPIC ONLY
Bacteria, U Microscopic: NEGATIVE
Casts, Ur, LPF, POC: NEGATIVE
Crystals, Ur, HPF, POC: NEGATIVE
Mucus, UA: NEGATIVE
Yeast, UA: NEGATIVE

## 2014-10-31 MED ORDER — OMEPRAZOLE 20 MG PO CPDR: 20.0000 mg | DELAYED_RELEASE_CAPSULE | Freq: Two times a day (BID) | ORAL | Status: DC

## 2014-10-31 MED ORDER — SIMVASTATIN 40 MG PO TABS: 40.0000 mg | ORAL_TABLET | Freq: Every day | ORAL | Status: DC

## 2014-10-31 NOTE — Progress Notes (Signed)
Subjective:    Patient ID: Charlotte Aguirre, female    DOB: [birth year], 68 y.o.   MRN: [id number]  HPI Patient here today for annual physical and pap- SHe has multiple medical problems but has  no complaints today. She does c/o painful intercourse that started several months ago.  Patient Active Problem List   Diagnosis Date Noted  . Osteopenia 09/13/2014  . Prediabetes 06/23/2014  . Metabolic syndrome 25/95/6387  . Obesity (BMI 30-39.9) 06/23/2014  . Trigeminal neuralgia syndrome 02/02/2014  . Screening examination for infectious disease 02/02/2014  . Unspecified vitamin D deficiency 02/02/2014  . Need for 23-polyvalent pneumococcal polysaccharide vaccine 02/02/2014  . Annual physical exam 01/03/2014  . Memory impairment 12/12/2013  . GERD (gastroesophageal reflux disease)   . Arthritis   . Hypertension   . EPIGASTRIC PAIN 11/07/2009  . Irritable bowel syndrome 11/06/2009  . ABDOMINAL PAIN, RIGHT LOWER QUADRANT, HX OF 11/06/2009  . Hereditary and idiopathic peripheral neuropathy 02/28/2009  . Hyperlipidemia with target LDL less than 100 02/27/2009  . ALLERGIC RHINITIS 02/27/2009  . DIVERTICULOSIS, MILD 02/27/2009  . OSTEOARTHRITIS 02/27/2009  . Myalgia and myositis 02/27/2009  . Palpitations 02/27/2009  . CHEST PAIN 02/27/2009  . PEPTIC ULCER DISEASE, HX OF 02/27/2009   Outpatient Encounter Prescriptions as of 10/31/2014  Medication Sig  . aspirin 81 MG chewable tablet Chew by mouth daily.  . calcium-vitamin D (OSCAL WITH D) 500-200 MG-UNIT per tablet Take 1 tablet by mouth 2 (two) times daily.  . Cholecalciferol (VITAMIN D-3 PO) Take 1 tablet by mouth 2 (two) times daily.   . cyclobenzaprine (FLEXERIL) 5 MG tablet Take 1 tablet (5 mg total) by mouth 3 (three) times daily as needed for muscle spasms.  . fluticasone (FLONASE) 50 MCG/ACT nasal spray Place 2 sprays into both nostrils daily.  Marland Kitchen gabapentin (NEURONTIN) 300 MG capsule Take 2 capsules (600 mg total) by mouth 3 (three)  times daily.  . hydrochlorothiazide (HYDRODIURIL) 25 MG tablet Take 1 tablet (25 mg total) by mouth daily.  . Multiple Vitamins-Minerals (MULTIVITAMINS THER. W/MINERALS) TABS tablet Take 1 tablet by mouth daily.  . Omega-3 Fatty Acids (FISH OIL) 500 MG CAPS Take 2 capsules by mouth 2 (two) times daily.   Marland Kitchen omeprazole (PRILOSEC) 20 MG capsule Take 1 capsule (20 mg total) by mouth 2 (two) times daily before a meal.  . simvastatin (ZOCOR) 40 MG tablet Take 1 tablet (40 mg total) by mouth daily.  . [DISCONTINUED] omeprazole (PRILOSEC) 20 MG capsule Take 20 mg by mouth 2 (two) times daily before a meal.   . [DISCONTINUED] simvastatin (ZOCOR) 40 MG tablet Take 40 mg by mouth daily.        Review of Systems  Constitutional: Negative.   Respiratory: Negative.   Cardiovascular: Negative.   Gastrointestinal: Negative.   Genitourinary: Negative.   Neurological: Negative.   Psychiatric/Behavioral: Negative.   All other systems reviewed and are negative.      Objective:   Physical Exam  Constitutional: She is oriented to person, place, and time. She appears well-developed and well-nourished.  HENT:  Head: Normocephalic.  Right Ear: Hearing, tympanic membrane, external ear and ear canal normal.  Left Ear: Hearing, tympanic membrane, external ear and ear canal normal.  Nose: Nose normal.  Mouth/Throat: Uvula is midline and oropharynx is clear and moist.  Eyes: Conjunctivae and EOM are normal. Pupils are equal, round, and reactive to light.  Neck: Normal range of motion and full passive range of motion without pain. Neck  supple. No JVD present. Carotid bruit is not present. No thyroid mass and no thyromegaly present.  Cardiovascular: Normal rate, normal heart sounds and intact distal pulses.   No murmur heard. Pulmonary/Chest: Effort normal and breath sounds normal. Right breast exhibits no inverted nipple, no mass, no nipple discharge, no skin change and no tenderness. Left breast exhibits no  inverted nipple, no mass, no nipple discharge, no skin change and no tenderness.  Abdominal: Soft. Bowel sounds are normal. She exhibits no mass. There is no tenderness.  Genitourinary: Vagina normal and uterus normal. No breast swelling, tenderness, discharge or bleeding.  bimanual exam-midline tnederness on palaption- no masses palpable Vaginal cuff intact Large rectocele  Musculoskeletal: Normal range of motion.  Lymphadenopathy:    She has no cervical adenopathy.  Neurological: She is alert and oriented to person, place, and time.  Skin: Skin is warm and dry.  Psychiatric: She has a normal mood and affect. Her behavior is normal. Judgment and thought content normal.    BP 127/73 mmHg  Pulse 78  Temp(Src) 97.7 F (36.5 C) (Oral)  Ht $R'5\' 1"'cB$  (1.549 m)  Wt 178 lb (80.74 kg)  BMI 33.65 kg/m2  Results for orders placed or performed in visit on 10/31/14  POCT UA - Microscopic Only  Result Value Ref Range   WBC, Ur, HPF, POC 10-15    RBC, urine, microscopic 1-5    Bacteria, U Microscopic NEG    Mucus, UA NEG    Epithelial cells, urine per micros OCC    Crystals, Ur, HPF, POC NEG    Casts, Ur, LPF, POC NEG    Yeast, UA NEG   POCT urinalysis dipstick  Result Value Ref Range   Color, UA YELLOW    Clarity, UA CLEAR    Glucose, UA NEG    Bilirubin, UA NEG    Ketones, UA NEG    Spec Grav, UA 1.010    Blood, UA TRACE    pH, UA 7.0    Protein, UA NEG    Urobilinogen, UA negative    Nitrite, UA NEG    Leukocytes, UA small (1+)         Assessment & Plan:   1. Annual physical exam   2. Encounter for routine gynecological examination   3. Essential hypertension   4. Gastroesophageal reflux disease without esophagitis   5. Irritable bowel syndrome   6. Prediabetes   7. Hereditary and idiopathic peripheral neuropathy   8. Trigeminal neuralgia syndrome   9. Myalgia and myositis   10. Hyperlipidemia with target LDL less than 100   11. Obesity (BMI 30-39.9)   12. Rectocele     13. Pain in joint, pelvic region and thigh, unspecified laterality   14. Dyspareunia   15. Pelvic pain in female    Orders Placed This Encounter  Procedures  . US Pelvis Complete    Standing Status: Future     Number of Occurrences:      Standing Expiration Date: 01/01/2016    Order Specific Question:  Reason for Exam (SYMPTOM  OR DIAGNOSIS REQUIRED)    Answer:  pelvic pain    Order Specific Question:  Preferred imaging location?    Answer:  [place]  . NMR, lipoprofile  . Thyroid Panel With TSH  . POCT UA - Microscopic Only  . POCT urinalysis dipstick  . POCT CBC   Health  Maintenance reviewed Labs pending Diet and exercise encouraged Follow up in 3 months  Haylo-Margaret Hassell Done, FNP

## 2014-11-01 LAB — CMP14+EGFR
ALT: 7 IU/L (ref 0–32)
AST: 15 IU/L (ref 0–40)
Albumin/Globulin Ratio: 1.6 (ref 1.1–2.5)
Albumin: 3.9 g/dL (ref 3.5–4.8)
Alkaline Phosphatase: 109 IU/L (ref 39–117)
BUN/Creatinine Ratio: 18 (ref 11–26)
BUN: 14 mg/dL (ref 8–27)
CO2: 30 mmol/L — ABNORMAL HIGH (ref 18–29)
Calcium: 9.7 mg/dL (ref 8.7–10.3)
Chloride: 99 mmol/L (ref 97–108)
Creatinine, Ser: 0.8 mg/dL (ref 0.57–1.00)
GFR calc Af Amer: 86 mL/min/{1.73_m2} (ref >59)
GFR calc non Af Amer: 75 mL/min/{1.73_m2} (ref >59)
Globulin, Total: 2.4 g/dL (ref 1.5–4.5)
Glucose: 89 mg/dL (ref 65–99)
Potassium: 4.1 mmol/L (ref 3.5–5.2)
Sodium: 144 mmol/L (ref 134–144)
Total Bilirubin: 0.3 mg/dL (ref 0.0–1.2)
Total Protein: 6.3 g/dL (ref 6.0–8.5)

## 2014-11-01 LAB — NMR, LIPOPROFILE
Cholesterol: 156 mg/dL (ref 100–199)
HDL Cholesterol by NMR: 38 mg/dL — ABNORMAL LOW (ref >39)
HDL Particle Number: 28.9 umol/L — ABNORMAL LOW (ref >=30.5)
LDL Particle Number: 1137 nmol/L — ABNORMAL HIGH (ref <1000)
LDL Size: 21 nm (ref >20.5)
LDL-C: 75 mg/dL (ref 0–99)
LP-IR Score: 73 — ABNORMAL HIGH (ref <=45)
Small LDL Particle Number: 551 nmol/L — ABNORMAL HIGH (ref <=527)
Triglycerides by NMR: 217 mg/dL — ABNORMAL HIGH (ref 0–149)

## 2014-11-01 LAB — THYROID PANEL WITH TSH
Free Thyroxine Index: 2.6 (ref 1.2–4.9)
T3 Uptake Ratio: 27 % (ref 24–39)
T4, Total: 9.6 ug/dL (ref 4.5–12.0)
TSH: 1.68 u[IU]/mL (ref 0.450–4.500)

## 2014-11-02 LAB — PAP IG (IMAGE GUIDED): PAP Smear Comment: 0

## 2014-11-07 ENCOUNTER — Other Ambulatory Visit: Payer: Self-pay

## 2014-11-07 MED ORDER — HYDROCHLOROTHIAZIDE 25 MG PO TABS: 25.0000 mg | ORAL_TABLET | Freq: Every day | ORAL | Status: DC

## 2014-11-07 MED ORDER — GABAPENTIN 300 MG PO CAPS: 600.0000 mg | ORAL_CAPSULE | Freq: Three times a day (TID) | ORAL | Status: DC

## 2014-11-08 ENCOUNTER — Other Ambulatory Visit: Payer: Self-pay | Admitting: Nurse Practitioner

## 2014-11-08 DIAGNOSIS — R102 Pelvic and perineal pain: Secondary | ICD-10-CM

## 2014-11-13 ENCOUNTER — Other Ambulatory Visit (HOSPITAL_COMMUNITY): Payer: Self-pay

## 2014-11-15 ENCOUNTER — Ambulatory Visit (HOSPITAL_COMMUNITY)
Admission: RE | Admit: 2014-11-15 | Discharge: 2014-11-15 | Disposition: A | Discharge status: Home / Self Care | Payer: Medicare Other | Source: Ambulatory Visit | Attending: Nurse Practitioner | Admitting: Nurse Practitioner

## 2014-11-15 DIAGNOSIS — R102 Pelvic and perineal pain: Secondary | ICD-10-CM | POA: Diagnosis not present

## 2015-01-27 ENCOUNTER — Other Ambulatory Visit: Payer: Self-pay | Admitting: Nurse Practitioner

## 2015-01-27 ENCOUNTER — Other Ambulatory Visit: Payer: Self-pay | Admitting: Family Medicine

## 2015-01-30 ENCOUNTER — Other Ambulatory Visit: Payer: Self-pay | Admitting: *Deleted

## 2015-01-30 MED ORDER — GABAPENTIN 300 MG PO CAPS: 600.0000 mg | ORAL_CAPSULE | Freq: Three times a day (TID) | ORAL | Status: DC

## 2015-02-06 ENCOUNTER — Encounter: Payer: Self-pay | Admitting: Nurse Practitioner

## 2015-02-06 ENCOUNTER — Ambulatory Visit (INDEPENDENT_AMBULATORY_CARE_PROVIDER_SITE_OTHER): Payer: Medicare Other | Admitting: Nurse Practitioner

## 2015-02-06 VITALS — BP 128/68 | HR 70 | Temp 97.2°F | Ht 61.0 in | Wt 179.0 lb

## 2015-02-06 DIAGNOSIS — G8929 Other chronic pain: Secondary | ICD-10-CM

## 2015-02-06 DIAGNOSIS — R1010 Upper abdominal pain, unspecified: Secondary | ICD-10-CM

## 2015-02-06 DIAGNOSIS — R1011 Right upper quadrant pain: Principal | ICD-10-CM

## 2015-02-06 MED ORDER — PANTOPRAZOLE SODIUM 40 MG PO TBEC: 40.0000 mg | DELAYED_RELEASE_TABLET | Freq: Every day | ORAL | Status: DC

## 2015-02-06 NOTE — Progress Notes (Signed)
   Subjective:    Patient ID: Charlotte Aguirre, female    DOB: [birth year], 69 y.o.   MRN: [id number]  HPI Patient in c/o right upper quadrant pain- SHe had her gallbladder removed in 1983. The pain started 1 week ago- has woken her up 2 x during the night- seems like drinking milk makes pain better. Rates pain 1-9/10- sharp pains are intermittent. Denies nausea or vomiting. No dark colored stools.Pain seems to be worse when she hasn't eaten.    Review of Systems  Constitutional: Negative.   HENT: Negative.   Respiratory: Negative.   Cardiovascular: Negative.   Genitourinary: Negative.   Neurological: Negative.   Psychiatric/Behavioral: Negative.   All other systems reviewed and are negative.      Objective:   Physical Exam  Constitutional: She is oriented to person, place, and time. She appears well-developed and well-nourished.  Cardiovascular: Normal rate, regular rhythm and normal heart sounds.   Pulmonary/Chest: Effort normal and breath sounds normal.  Abdominal: Soft. Bowel sounds are normal. She exhibits no mass. There is tenderness (right upper quadrant). There is no rebound and no guarding.  Neurological: She is alert and oriented to person, place, and time.  Skin: Skin is warm and dry.  Psychiatric: She has a normal mood and affect. Her behavior is normal. Judgment and thought content normal.    BP 128/68 mmHg  Pulse 70  Temp(Src) 97.2 F (36.2 C) (Oral)  Ht 5\' 1"  (1.549 m)  Wt 179 lb (81.194 kg)  BMI 33.84 kg/m2       Assessment & Plan:   1. Abdominal pain, chronic, right upper quadrant    Meds ordered this encounter  Medications  . DISCONTD: pantoprazole (PROTONIX) 40 MG tablet    Sig: Take 1 tablet (40 mg total) by mouth daily.    Dispense:  30 tablet    Refill:  3    Order Specific Question:  Supervising Provider    Answer:  Chipper Herb [[id number]]  . pantoprazole (PROTONIX) 40 MG tablet    Sig: Take 1 tablet (40 mg total) by mouth daily.    Dispense:  30  tablet    Refill:  3    Order Specific Question:  Supervising Provider    Answer:  Joycelyn Man   Avoid spicy and fatty foods Referral to GI RTO prn  Charlotte Hassell Done, FNP

## 2015-02-06 NOTE — Patient Instructions (Signed)
Peptic Ulcer A peptic ulcer is a painful sore in the lining of your esophagus, stomach, or in the first part of your small intestine. The main causes of an ulcer can be:  An infection.  Using certain pain medicines too often or too much.  Smoking. HOME CARE  Avoid smoking, alcohol, and caffeine.  Avoid foods that bother you.  Only take medicine as told by your doctor. Do not take any medicines your doctor has not approved.  Keep all doctor visits as told. GET HELP IF:  You do not get better in 7 days after starting treatment.  You keep having an upset stomach (indigestion) or heartburn. GET HELP RIGHT AWAY IF:  You have sudden, sharp, or lasting belly (abdominal) pain.  You have bloody, black, or tarry poop (stool).  You throw up (vomit) blood or your throw up looks like coffee grounds.  You get light-headed, weak, or feel like you will pass out (faint).  You get sweaty or feel sticky and cold to the touch (clammy). MAKE SURE YOU:   Understand these instructions.  Will watch your condition.  Will get help right away if you are not doing well or get worse. Document Released: 01/14/2010 Document Revised: 03/06/2014 Document Reviewed: 05/19/2012 [facility] Patient Information 2015 [place], [place]. This information is not intended to replace advice given to you by your health care provider. Make sure you discuss any questions you have with your health care provider.

## 2015-02-09 ENCOUNTER — Encounter (INDEPENDENT_AMBULATORY_CARE_PROVIDER_SITE_OTHER): Payer: Self-pay | Admitting: *Deleted

## 2015-02-09 ENCOUNTER — Other Ambulatory Visit: Payer: Self-pay | Admitting: Nurse Practitioner

## 2015-02-09 DIAGNOSIS — E785 Hyperlipidemia, unspecified: Secondary | ICD-10-CM

## 2015-02-09 MED ORDER — SIMVASTATIN 40 MG PO TABS: 40.0000 mg | ORAL_TABLET | Freq: Every day | ORAL | Status: DC

## 2015-02-09 NOTE — Telephone Encounter (Signed)
done

## 2015-02-21 ENCOUNTER — Ambulatory Visit (INDEPENDENT_AMBULATORY_CARE_PROVIDER_SITE_OTHER): Payer: Medicare Other | Admitting: Internal Medicine

## 2015-03-28 ENCOUNTER — Ambulatory Visit (INDEPENDENT_AMBULATORY_CARE_PROVIDER_SITE_OTHER): Payer: Medicare Other | Admitting: *Deleted

## 2015-03-28 ENCOUNTER — Encounter: Payer: Self-pay | Admitting: *Deleted

## 2015-03-28 VITALS — BP 124/70 | HR 71 | Ht 61.5 in | Wt 181.0 lb

## 2015-03-28 DIAGNOSIS — Z23 Encounter for immunization: Secondary | ICD-10-CM | POA: Diagnosis not present

## 2015-03-28 DIAGNOSIS — Z0000 Encounter for general adult medical examination without abnormal findings: Secondary | ICD-10-CM | POA: Diagnosis not present

## 2015-03-28 NOTE — Patient Instructions (Signed)
Review Advance Directives with family and bring a copy once signed.  Exercise for at least 30 minutes 3 times per week.    Health Maintenance Adopting a healthy lifestyle and getting preventive care can go a long way to promote health and wellness. Talk with your health care provider about what schedule of regular examinations is right for you. This is a good chance for you to check in with your provider about disease prevention and staying healthy. In between checkups, there are plenty of things you can do on your own. Experts have done a lot of research about which lifestyle changes and preventive measures are most likely to keep you healthy. Ask your health care provider for more information. WEIGHT AND DIET  Eat a healthy diet  Be sure to include plenty of vegetables, fruits, low-fat dairy products, and lean protein.  Do not eat a lot of foods high in solid fats, added sugars, or salt.  Get regular exercise. This is one of the most important things you can do for your health.  Most adults should exercise for at least 150 minutes each week. The exercise should increase your heart rate and make you sweat (moderate-intensity exercise).  Most adults should also do strengthening exercises at least twice a week. This is in addition to the moderate-intensity exercise.  Maintain a healthy weight  Body mass index (BMI) is a measurement that can be used to identify possible weight problems. It estimates body fat based on height and weight. Your health care provider can help determine your BMI and help you achieve or maintain a healthy weight.  For females 3 years of age and older:   A BMI below 18.5 is considered underweight.  A BMI of 18.5 to 24.9 is normal.  A BMI of 25 to 29.9 is considered overweight.  A BMI of 30 and above is considered obese.  Watch levels of cholesterol and blood lipids  You should start having your blood tested for lipids and cholesterol at 71 years of age,  then have this test every 5 years.  You may need to have your cholesterol levels checked more often if:  Your lipid or cholesterol levels are high.  You are older than 71 years of age.  You are at high risk for heart disease.  CANCER SCREENING   Lung Cancer  Lung cancer screening is recommended for adults 30-25 years old who are at high risk for lung cancer because of a history of smoking.  A yearly low-dose CT scan of the lungs is recommended for people who:  Currently smoke.  Have quit within the past 15 years.  Have at least a 30-pack-year history of smoking. A pack year is smoking an average of one pack of cigarettes a day for 1 year.  Yearly screening should continue until it has been 15 years since you quit.  Yearly screening should stop if you develop a health problem that would prevent you from having lung cancer treatment.  Breast Cancer  Practice breast self-awareness. This means understanding how your breasts normally appear and feel.  It also means doing regular breast self-exams. Let your health care provider know about any changes, no matter how small.  If you are in your 20s or 30s, you should have a clinical breast exam (CBE) by a health care provider every 1-3 years as part of a regular health exam.  If you are 63 or older, have a CBE every year. Also consider having a breast X-ray (mammogram)  every year.  If you have a family history of breast cancer, talk to your health care provider about genetic screening.  If you are at high risk for breast cancer, talk to your health care provider about having an MRI and a mammogram every year.  Breast cancer gene (BRCA) assessment is recommended for women who have family members with BRCA-related cancers. BRCA-related cancers include:  Breast.  Ovarian.  Tubal.  Peritoneal cancers.  Results of the assessment will determine the need for genetic counseling and BRCA1 and BRCA2 testing. Cervical  Cancer Routine pelvic examinations to screen for cervical cancer are no longer recommended for nonpregnant women who are considered low risk for cancer of the pelvic organs (ovaries, uterus, and vagina) and who do not have symptoms. A pelvic examination may be necessary if you have symptoms including those associated with pelvic infections. Ask your health care provider if a screening pelvic exam is right for you.   The Pap test is the screening test for cervical cancer for women who are considered at risk.  If you had a hysterectomy for a problem that was not cancer or a condition that could lead to cancer, then you no longer need Pap tests.  If you are older than 65 years, and you have had normal Pap tests for the past 10 years, you no longer need to have Pap tests.  If you have had past treatment for cervical cancer or a condition that could lead to cancer, you need Pap tests and screening for cancer for at least 20 years after your treatment.  If you no longer get a Pap test, assess your risk factors if they change (such as having a new sexual partner). This can affect whether you should start being screened again.  Some women have medical problems that increase their chance of getting cervical cancer. If this is the case for you, your health care provider may recommend more frequent screening and Pap tests.  The human papillomavirus (HPV) test is another test that may be used for cervical cancer screening. The HPV test looks for the virus that can cause cell changes in the cervix. The cells collected during the Pap test can be tested for HPV.  The HPV test can be used to screen women 50 years of age and older. Getting tested for HPV can extend the interval between normal Pap tests from three to five years.  An HPV test also should be used to screen women of any age who have unclear Pap test results.  After 71 years of age, women should have HPV testing as often as Pap tests.  Colorectal  Cancer  This type of cancer can be detected and often prevented.  Routine colorectal cancer screening usually begins at 71 years of age and continues through 71 years of age.  Your health care provider may recommend screening at an earlier age if you have risk factors for colon cancer.  Your health care provider may also recommend using home test kits to check for hidden blood in the stool.  A small camera at the end of a tube can be used to examine your colon directly (sigmoidoscopy or colonoscopy). This is done to check for the earliest forms of colorectal cancer.  Routine screening usually begins at age 20.  Direct examination of the colon should be repeated every 5-10 years through 71 years of age. However, you may need to be screened more often if early forms of precancerous polyps or small growths are  found. Skin Cancer  Check your skin from head to toe regularly.  Tell your health care provider about any new moles or changes in moles, especially if there is a change in a mole's shape or color.  Also tell your health care provider if you have a mole that is larger than the size of a pencil eraser.  Always use sunscreen. Apply sunscreen liberally and repeatedly throughout the day.  Protect yourself by wearing long sleeves, pants, a wide-brimmed hat, and sunglasses whenever you are outside. HEART DISEASE, DIABETES, AND HIGH BLOOD PRESSURE   Have your blood pressure checked at least every 1-2 years. High blood pressure causes heart disease and increases the risk of stroke.  If you are between 8 years and 32 years old, ask your health care provider if you should take aspirin to prevent strokes.  Have regular diabetes screenings. This involves taking a blood sample to check your fasting blood sugar level.  If you are at a normal weight and have a low risk for diabetes, have this test once every three years after 71 years of age.  If you are overweight and have a high risk for  diabetes, consider being tested at a younger age or more often. PREVENTING INFECTION  Hepatitis B  If you have a higher risk for hepatitis B, you should be screened for this virus. You are considered at high risk for hepatitis B if:  You were born in a country where hepatitis B is common. Ask your health care provider which countries are considered high risk.  Your parents were born in a high-risk country, and you have not been immunized against hepatitis B (hepatitis B vaccine).  You have HIV or AIDS.  You use needles to inject street drugs.  You live with someone who has hepatitis B.  You have had sex with someone who has hepatitis B.  You get hemodialysis treatment.  You take certain medicines for conditions, including cancer, organ transplantation, and autoimmune conditions. Hepatitis C  Blood testing is recommended for:  Everyone born from [birth year] through 1967.  Anyone with known risk factors for hepatitis C. Sexually transmitted infections (STIs)  You should be screened for sexually transmitted infections (STIs) including gonorrhea and chlamydia if:  You are sexually active and are younger than 71 years of age.  You are older than 71 years of age and your health care provider tells you that you are at risk for this type of infection.  Your sexual activity has changed since you were last screened and you are at an increased risk for chlamydia or gonorrhea. Ask your health care provider if you are at risk.  If you do not have HIV, but are at risk, it may be recommended that you take a prescription medicine daily to prevent HIV infection. This is called pre-exposure prophylaxis (PrEP). You are considered at risk if:  You are sexually active and do not regularly use condoms or know the HIV status of your partner(s).  You take drugs by injection.  You are sexually active with a partner who has HIV. Talk with your health care provider about whether you are at high risk of  being infected with HIV. If you choose to begin PrEP, you should first be tested for HIV. You should then be tested every 3 months for as long as you are taking PrEP.  PREGNANCY   If you are premenopausal and you may become pregnant, ask your health care provider about preconception counseling.  If  you may become pregnant, take 400 to 800 micrograms (mcg) of folic acid every day.  If you want to prevent pregnancy, talk to your health care provider about birth control (contraception). OSTEOPOROSIS AND MENOPAUSE   Osteoporosis is a disease in which the bones lose minerals and strength with aging. This can result in serious bone fractures. Your risk for osteoporosis can be identified using a bone density scan.  If you are 61 years of age or older, or if you are at risk for osteoporosis and fractures, ask your health care provider if you should be screened.  Ask your health care provider whether you should take a calcium or vitamin D supplement to lower your risk for osteoporosis.  Menopause may have certain physical symptoms and risks.  Hormone replacement therapy may reduce some of these symptoms and risks. Talk to your health care provider about whether hormone replacement therapy is right for you.  HOME CARE INSTRUCTIONS   Schedule regular health, dental, and eye exams.  Stay current with your immunizations.   Do not use any tobacco products including cigarettes, chewing tobacco, or electronic cigarettes.  If you are pregnant, do not drink alcohol.  If you are breastfeeding, limit how much and how often you drink alcohol.  Limit alcohol intake to no more than 1 drink per day for nonpregnant women. One drink equals 12 ounces of beer, 5 ounces of wine, or 1 ounces of hard liquor.  Do not use street drugs.  Do not share needles.  Ask your health care provider for help if you need support or information about quitting drugs.  Tell your health care provider if you often feel  depressed.  Tell your health care provider if you have ever been abused or do not feel safe at home. Document Released: 05/05/2011 Document Revised: 03/06/2014 Document Reviewed: 09/21/2013 [facility] Patient Information 2015 [place], [place]. This information is not intended to replace advice given to you by your health care provider. Make sure you discuss any questions you have with your health care provider.   Pneumococcal Vaccine, Polyvalent suspension for injection What is this medicine? PNEUMOCOCCAL VACCINE, POLYVALENT (NEU mo KOK al vak SEEN, pol ee VEY luhnt) is a vaccine to prevent pneumococcus bacteria infection. These bacteria are a major cause of ear infections, 'Strep throat' infections, and serious pneumonia, meningitis, or blood infections worldwide. These vaccines help the body to produce antibodies (protective substances) that help your body defend against these bacteria. This vaccine is recommended for infants and young children. This vaccine will not treat an infection. This medicine may be used for other purposes; ask your health care provider or pharmacist if you have questions. COMMON BRAND NAME(S): Prevnar 13 What should I tell my health care provider before I take this medicine? They need to know if you have any of these conditions: -bleeding problems -fever -immune system problems -low platelet count in the blood -seizures -an unusual or allergic reaction to pneumococcal vaccine, diphtheria toxoid, other vaccines, latex, other medicines, foods, dyes, or preservatives -pregnant or trying to get pregnant -breast-feeding How should I use this medicine? This vaccine is for injection into a muscle. It is given by a health care professional. A copy of Vaccine Information Statements will be given before each vaccination. Read this sheet carefully each time. The sheet may change frequently. Talk to your pediatrician regarding the use of this medicine in children. While this  drug may be prescribed for children as young as 41 weeks old for selected conditions, precautions  do apply. Overdosage: If you think you have taken too much of this medicine contact a poison control center or emergency room at once. NOTE: This medicine is only for you. Do not share this medicine with others. What if I miss a dose? It is important not to miss your dose. Call your doctor or health care professional if you are unable to keep an appointment. What may interact with this medicine? -medicines for cancer chemotherapy -medicines that suppress your immune function -medicines that treat or prevent blood clots like warfarin, enoxaparin, and dalteparin -steroid medicines like prednisone or cortisone This list may not describe all possible interactions. Give your health care provider a list of all the medicines, herbs, non-prescription drugs, or dietary supplements you use. Also tell them if you smoke, drink alcohol, or use illegal drugs. Some items may interact with your medicine. What should I watch for while using this medicine? Mild fever and pain should go away in 3 days or less. Report any unusual symptoms to your doctor or health care professional. What side effects may I notice from receiving this medicine? Side effects that you should report to your doctor or health care professional as soon as possible: -allergic reactions like skin rash, itching or hives, swelling of the face, lips, or tongue -breathing problems -confused -fever over 102 degrees F -pain, tingling, numbness in the hands or feet -seizures -unusual bleeding or bruising -unusual muscle weakness Side effects that usually do not require medical attention (report to your doctor or health care professional if they continue or are bothersome): -aches and pains -diarrhea -fever of 102 degrees F or less -headache -irritable -loss of appetite -pain, tender at site where injected -trouble sleeping This list may not  describe all possible side effects. Call your doctor for medical advice about side effects. You may report side effects to FDA at 1-800-FDA-1088. Where should I keep my medicine? This does not apply. This vaccine is given in a clinic, pharmacy, doctor's office, or other health care setting and will not be stored at home. NOTE: This sheet is a summary. It may not cover all possible information. If you have questions about this medicine, talk to your doctor, pharmacist, or health care provider.  2015, [employer]. (2009-01-02 10:17:22)

## 2015-04-03 ENCOUNTER — Ambulatory Visit (INDEPENDENT_AMBULATORY_CARE_PROVIDER_SITE_OTHER): Payer: Medicare Other | Admitting: Family Medicine

## 2015-04-03 ENCOUNTER — Encounter: Payer: Self-pay | Admitting: Family Medicine

## 2015-04-03 VITALS — BP 119/70 | HR 77 | Temp 98.3°F | Ht 61.0 in | Wt 180.8 lb

## 2015-04-03 DIAGNOSIS — R51 Headache: Secondary | ICD-10-CM | POA: Diagnosis not present

## 2015-04-03 DIAGNOSIS — R519 Headache, unspecified: Secondary | ICD-10-CM

## 2015-04-03 LAB — POCT CBC
Granulocyte percent: 60.4 %G (ref 37–80)
HCT, POC: 43.1 % (ref 37.7–47.9)
Hemoglobin: 13.5 g/dL (ref 12.2–16.2)
Lymph, poc: 2.9 (ref 0.6–3.4)
MCH, POC: 26.1 pg — AB (ref 27–31.2)
MCHC: 31.4 g/dL — AB (ref 31.8–35.4)
MCV: 83.1 fL (ref 80–97)
MPV: 8.7 fL (ref 0–99.8)
POC Granulocyte: 5.3 (ref 2–6.9)
POC LYMPH PERCENT: 32.6 %L (ref 10–50)
Platelet Count, POC: 245 10*3/uL (ref 142–424)
RBC: 5.18 M/uL (ref 4.04–5.48)
RDW, POC: 13.4 %
WBC: 8.8 10*3/uL (ref 4.6–10.2)

## 2015-04-03 MED ORDER — PREDNISONE 10 MG PO TABS: ORAL_TABLET | ORAL | Status: DC

## 2015-04-03 NOTE — Progress Notes (Signed)
Subjective:  Patient ID: Charlotte Aguirre, female    DOB: 1946-07-04  Age: 69 y.o. MRN: [id number]  CC: Trigeminal Neuralgia and Hoarse   HPI Graclyn Lawther Minich presents for several months of increasing pain in the left frontal region there is point tenderness that is 8/10. This occurs daily. She does get relief with Advil but it lasts several hours per occasion. There is also point tenderness in the left cheek and in the preauricular area as well as the mastoid region. Each of these is described as sharp. Temporally they are related. Each is relieved with Advil. There is no effect on speech hearing or vision. The pain does not radiate. It is simply a sharp sensation that stays in each of these spots. The patient has a long history of migraine. She was tentatively diagnosed with trigeminal neuralgia that turned out to be a migraine variant about a year ago. That resolved for several months but has been replaced recently by these symptoms.   History Elita has a past medical history of GERD (gastroesophageal reflux disease); Fibromyalgia; Peripheral neuropathy; Arthritis; Hypertension; Hyperlipidemia; IBS (irritable bowel syndrome); Headache(784.0); Ulcer; Vitamin D insufficiency; Memory changes; Cataract; Osteopenia; and Wrist fracture (2013).   She has past surgical history that includes Cholecystectomy; Colonoscopy (N/A, 03/22/2014); Abdominal hysterectomy; and Oophorectomy.   Her family history includes COPD in her sister; Cancer in her brother and brother; Dementia in her brother; Diabetes in her sister; Glaucoma in her father; Heart attack in her brother, brother, brother, father, and sister; Heart failure in her brother, father, mother, and sister; Obesity in her sister; Osteoporosis in her sister; Thyroid disease in her son.She reports that she quit smoking about 26 years ago. Her smoking use included Cigarettes. She has never used smokeless tobacco. She reports that she does not drink alcohol or use illicit  drugs.  Outpatient Prescriptions Prior to Visit  Medication Sig Dispense Refill  . aspirin 81 MG chewable tablet Chew by mouth daily.    . calcium-vitamin D (OSCAL WITH D) 500-200 MG-UNIT per tablet Take 1 tablet by mouth 2 (two) times daily.    . Cholecalciferol (VITAMIN D-3 PO) Take 1 tablet by mouth 2 (two) times daily.     . fluticasone (FLONASE) 50 MCG/ACT nasal spray Place 2 sprays into both nostrils daily. 16 g 6  . gabapentin (NEURONTIN) 300 MG capsule Take 2 capsules (600 mg total) by mouth 3 (three) times daily. 540 capsule 0  . hydrochlorothiazide (HYDRODIURIL) 25 MG tablet Take 1 tablet by mouth  daily 90 tablet 0  . Multiple Vitamins-Minerals (MULTIVITAMINS THER. W/MINERALS) TABS tablet Take 1 tablet by mouth daily.    . Omega-3 Fatty Acids (FISH OIL) 500 MG CAPS Take 2 capsules by mouth 2 (two) times daily.     . pantoprazole (PROTONIX) 40 MG tablet Take 1 tablet (40 mg total) by mouth daily. 30 tablet 3  . simvastatin (ZOCOR) 40 MG tablet Take 1 tablet (40 mg total) by mouth daily. 90 tablet 0   No facility-administered medications prior to visit.    ROS Review of Systems  Constitutional: Negative for fever, chills, diaphoresis, appetite change, fatigue and unexpected weight change.  HENT: Negative for congestion, ear pain, hearing loss, postnasal drip, rhinorrhea, sneezing, sore throat and trouble swallowing.   Eyes: Negative for pain.  Respiratory: Negative for cough, chest tightness and shortness of breath.   Cardiovascular: Negative for chest pain and palpitations.  Gastrointestinal: Negative for nausea, vomiting, abdominal pain, diarrhea and constipation.  Genitourinary: Negative for dysuria, frequency and menstrual problem.  Musculoskeletal: Negative for joint swelling and arthralgias.  Skin: Negative for rash.  Neurological: Positive for headaches. Negative for dizziness, syncope, weakness and numbness.  Psychiatric/Behavioral: Negative for dysphoric mood and  agitation.    Objective:  BP 119/70 mmHg  Pulse 77  Temp(Src) 98.3 F (36.8 C) (Oral)  Ht $R'5\' 1"'rX$  (1.549 m)  Wt 180 lb 12.8 oz (82.01 kg)  BMI 34.18 kg/m2  BP Readings from Last 3 Encounters:  04/03/15 119/70  02/06/15 128/68  10/31/14 127/73    Wt Readings from Last 3 Encounters:  04/03/15 180 lb 12.8 oz (82.01 kg)  02/06/15 179 lb (81.194 kg)  10/31/14 178 lb (80.74 kg)     Physical Exam  Constitutional: She is oriented to person, place, and time. She appears well-developed and well-nourished. No distress.  HENT:  Head: Normocephalic and atraumatic.  Right Ear: External ear normal.  Left Ear: External ear normal.  Nose: Nose normal.  Mouth/Throat: Oropharynx is clear and moist.  Eyes: Conjunctivae and EOM are normal. Pupils are equal, round, and reactive to light.  Neck: Normal range of motion. Neck supple. No thyromegaly present.  Cardiovascular: Normal rate, regular rhythm and normal heart sounds.   No murmur heard. Pulmonary/Chest: Effort normal and breath sounds normal. No respiratory distress. She has no wheezes. She has no rales.  Abdominal: Soft. Bowel sounds are normal. She exhibits no distension. There is no tenderness.  Lymphadenopathy:    She has no cervical adenopathy.  Neurological: She is alert and oriented to person, place, and time. She has normal reflexes.  Skin: Skin is warm and dry.  Psychiatric: She has a normal mood and affect. Her behavior is normal. Judgment and thought content normal.    Lab Results  Component Value Date   HGBA1C  02/16/2009    5.6 (NOTE) The ADA recommends the following therapeutic goal for glycemic control related to Hgb A1c measurement: Goal of therapy: <6.5 Hgb A1c  Reference: American Diabetes Association: Clinical Practice Recommendations 2010, Diabetes Care, 2010, 33: (Suppl  1).    Lab Results  Component Value Date   WBC 8.8 04/03/2015   HGB 13.5 04/03/2015   HCT 43.1 04/03/2015   PLT 214 10/31/2010   GLUCOSE  89 10/31/2014   CHOL 156 10/31/2014   TRIG 217* 10/31/2014   HDL 38* 10/31/2014   LDLCALC 91 02/02/2014   ALT 7 10/31/2014   AST 15 10/31/2014   NA 144 10/31/2014   K 4.1 10/31/2014   CL 99 10/31/2014   CREATININE 0.80 10/31/2014   BUN 14 10/31/2014   CO2 30* 10/31/2014   TSH 1.680 10/31/2014   HGBA1C  02/16/2009    5.6 (NOTE) The ADA recommends the following therapeutic goal for glycemic control related to Hgb A1c measurement: Goal of therapy: <6.5 Hgb A1c  Reference: American Diabetes Association: Clinical Practice Recommendations 2010, Diabetes Care, 2010, 33: (Suppl  1).    US Transvaginal Non-ob  11/15/2014   CLINICAL DATA:  Pelvic pain.  EXAM: TRANSABDOMINAL AND TRANSVAGINAL ULTRASOUND OF PELVIS  TECHNIQUE: Both transabdominal and transvaginal ultrasound examinations of the pelvis were performed. Transabdominal technique was performed for global imaging of the pelvis including uterus, ovaries, adnexal regions, and pelvic cul-de-sac. It was necessary to proceed with endovaginal exam following the transabdominal exam to visualize the adnexa and pelvis.  COMPARISON:  None  FINDINGS: Uterus  Hysterectomy.  Right ovary  Oophorectomy.  Left ovary  Oophorectomy.  Other findings  No  free fluid.  IMPRESSION: Prior hysterectomy and bilateral oophorectomy. No focal abnormality identified. No pelvic mass lesions or fluid collections noted.   Electronically Signed   By: Marcello Moores  Register   On: 11/15/2014 12:44   US Pelvis Complete  11/15/2014   CLINICAL DATA:  Pelvic pain.  EXAM: TRANSABDOMINAL AND TRANSVAGINAL ULTRASOUND OF PELVIS  TECHNIQUE: Both transabdominal and transvaginal ultrasound examinations of the pelvis were performed. Transabdominal technique was performed for global imaging of the pelvis including uterus, ovaries, adnexal regions, and pelvic cul-de-sac. It was necessary to proceed with endovaginal exam following the transabdominal exam to visualize the adnexa and pelvis.  COMPARISON:   None  FINDINGS: Uterus  Hysterectomy.  Right ovary  Oophorectomy.  Left ovary  Oophorectomy.  Other findings  No free fluid.  IMPRESSION: Prior hysterectomy and bilateral oophorectomy. No focal abnormality identified. No pelvic mass lesions or fluid collections noted.   Electronically Signed   By: Marcello Moores  Register   On: 11/15/2014 12:44    Assessment & Plan:   Tayla was seen today for trigeminal neuralgia and hoarse.  Diagnoses and all orders for this visit:  Headache around the eyes Orders: -     POCT CBC -     CMP14+EGFR -     Sedimentation rate  Other orders -     predniSONE (DELTASONE) 10 MG tablet; Take 5 daily for 3 days followed by 4,3,2 and 1 for 3 days each.   I am having Ms. Heckler start on predniSONE. I am also having her maintain her aspirin, Cholecalciferol (VITAMIN D-3 PO), Fish Oil, multivitamins ther. w/minerals, fluticasone, calcium-vitamin D, hydrochlorothiazide, gabapentin, pantoprazole, and simvastatin.  Meds ordered this encounter  Medications  . predniSONE (DELTASONE) 10 MG tablet    Sig: Take 5 daily for 3 days followed by 4,3,2 and 1 for 3 days each.    Dispense:  45 tablet    Refill:  0     Follow-up: Return in about 6 months (around 10/03/2015), or if symptoms worsen or fail to improve.  [name], M.D.

## 2015-04-04 LAB — CMP14+EGFR
ALT: 6 IU/L (ref 0–32)
AST: 15 IU/L (ref 0–40)
Albumin/Globulin Ratio: 1.9 (ref 1.1–2.5)
Albumin: 4.1 g/dL (ref 3.5–4.8)
Alkaline Phosphatase: 102 IU/L (ref 39–117)
BUN/Creatinine Ratio: 15 (ref 11–26)
BUN: 13 mg/dL (ref 8–27)
Bilirubin Total: 0.2 mg/dL (ref 0.0–1.2)
CO2: 30 mmol/L — ABNORMAL HIGH (ref 18–29)
Calcium: 9.6 mg/dL (ref 8.7–10.3)
Chloride: 99 mmol/L (ref 97–108)
Creatinine, Ser: 0.85 mg/dL (ref 0.57–1.00)
GFR calc Af Amer: 80 mL/min/{1.73_m2} (ref >59)
GFR calc non Af Amer: 70 mL/min/{1.73_m2} (ref >59)
Globulin, Total: 2.2 g/dL (ref 1.5–4.5)
Glucose: 95 mg/dL (ref 65–99)
Potassium: 3.9 mmol/L (ref 3.5–5.2)
Sodium: 144 mmol/L (ref 134–144)
Total Protein: 6.3 g/dL (ref 6.0–8.5)

## 2015-04-04 LAB — SEDIMENTATION RATE: Sed Rate: 5 mm/hr (ref 0–40)

## 2015-04-05 ENCOUNTER — Telehealth: Payer: Self-pay | Admitting: *Deleted

## 2015-04-05 NOTE — Telephone Encounter (Signed)
-----   Message from [name], MD sent at 04/05/2015  1:09 PM EDT ----- Jeri Modena,    Your lab result is normal.Some minor variations that are not significant are commonly marked abnormal, but do not represent any medical problem for you.  Best regards, [name], M.D.

## 2015-04-06 NOTE — Progress Notes (Signed)
Subjective:   Charlotte Aguirre is a 69 y.o. female who presents for an Initial Medicare Annual Wellness Visit. Charlotte Aguirre lives at home with her husband. She has an emotional support dog and attends a crochet group every Tuesday morning.  She also plays memory games on her tablet.   Review of Systems    Musculoskeletal: Left knee pain, weakness  Cardiac Risk Factors include: advanced age (>29men, >42 women);dyslipidemia , obesity  Other systems negative     Objective:    Today's Vitals   03/28/15 1547  BP: 124/70  Pulse: 71  Height: 5' 1.5" (1.562 m)  Weight: 181 lb (82.101 kg)    Current Medications (verified) Outpatient Encounter Prescriptions as of 03/28/2015  Medication Sig  . aspirin 81 MG chewable tablet Chew by mouth daily.  . calcium-vitamin D (OSCAL WITH D) 500-200 MG-UNIT per tablet Take 1 tablet by mouth 2 (two) times daily.  . Cholecalciferol (VITAMIN D-3 PO) Take 1 tablet by mouth 2 (two) times daily.   . fluticasone (FLONASE) 50 MCG/ACT nasal spray Place 2 sprays into both nostrils daily.  Marland Kitchen gabapentin (NEURONTIN) 300 MG capsule Take 2 capsules (600 mg total) by mouth 3 (three) times daily.  . hydrochlorothiazide (HYDRODIURIL) 25 MG tablet Take 1 tablet by mouth  daily  . Multiple Vitamins-Minerals (MULTIVITAMINS THER. W/MINERALS) TABS tablet Take 1 tablet by mouth daily.  . Omega-3 Fatty Acids (FISH OIL) 500 MG CAPS Take 2 capsules by mouth 2 (two) times daily.   . pantoprazole (PROTONIX) 40 MG tablet Take 1 tablet (40 mg total) by mouth daily.  . simvastatin (ZOCOR) 40 MG tablet Take 1 tablet (40 mg total) by mouth daily.   No facility-administered encounter medications on file as of 03/28/2015.    Allergies (verified) Aspirin   History: Past Medical History  Diagnosis Date  . GERD (gastroesophageal reflux disease)   . Fibromyalgia   . Peripheral neuropathy   . Arthritis     thumb and fingers  . Hypertension   . Hyperlipidemia   . IBS (irritable bowel  syndrome)   . Headache(784.0)   . Ulcer     gastric  . Vitamin D insufficiency   . Memory changes   . Cataract   . Osteopenia   . Wrist fracture 2013   Past Surgical History  Procedure Laterality Date  . Cholecystectomy    . Colonoscopy N/A 03/22/2014    Procedure: COLONOSCOPY;  Surgeon: Charlotte Houston, MD;  Location: [facility];  Service: Endoscopy;  Laterality: N/A;  930  . Abdominal hysterectomy      uterus frist and 10 years later both ovaries removed  . Oophorectomy     Family History  Problem Relation Age of Onset  . Dementia Brother   . Heart attack Brother   . Heart failure Mother   . Heart failure Father   . Heart attack Father   . Glaucoma Father   . Heart failure Brother   . Heart attack Brother   . Heart failure Sister   . Heart attack Sister   . Diabetes Sister   . Obesity Sister   . Cancer Brother     LYMPHOMA  . Heart attack Brother   . Cancer Brother     lymphoma  . COPD Sister     from 2nd hand smoke - never a smoker  . Osteoporosis Sister   . Thyroid disease Son    Social History   Occupational History  . Not  on file.   Social History Main Topics  . Smoking status: Former Smoker    Types: Cigarettes    Quit date: 11/03/1988  . Smokeless tobacco: Never Used  . Alcohol Use: No  . Drug Use: No  . Sexual Activity:    Partners: Male    Activities of Daily Living In your present state of health, do you have any difficulty performing the following activities: 03/28/2015 10/31/2014  Hearing? Y N  Vision? Y N  Difficulty concentrating or making decisions? Y N  Walking or climbing stairs? Y N  Dressing or bathing? N N  Doing errands, shopping? N N  Preparing Food and eating ? N -  Using the Toilet? N -  In the past six months, have you accidently leaked urine? N -  Do you have problems with loss of bowel control? N -  Managing your Medications? N -  Managing your Finances? N -  Housekeeping or managing your Housekeeping? N -    Decreased hearing. Declined audiology referral at this time.  Eye glass prescription needs to be updated.   Immunizations and Health Maintenance Immunization History  Administered Date(s) Administered  . Influenza, High Dose Seasonal PF 07/24/2014  . Pneumococcal Conjugate-13 03/28/2015  . Pneumococcal Polysaccharide-23 02/02/2014   There are no preventive care reminders to display for this patient.  Patient Care Team: Charlotte Fraise, MD as PCP - General (Family Medicine) Charlotte Houston, MD as Consulting Physician (Gastroenterology) Charlotte Mings Margret Chance, MD as Referring Physician (Optometry)      Assessment:   This is a routine wellness examination for [name].   Hearing/Vision screen No hearing or vision deficits noted during exam  Dietary issues and exercise activities discussed: Current Exercise Habits:: Home exercise routine, Type of exercise: walking, Time (Minutes): 30, Frequency (Times/Week): 6, Weekly Exercise (Minutes/Week): 180, Intensity: Mild  Goals    . Exercise 3x per week (30 min per time)      Depression Screen PHQ 2/9 Scores 03/28/2015 10/31/2014 08/18/2014 06/23/2014 02/02/2014  PHQ - 2 Score 0 0 1 0 0    Fall Risk Fall Risk  03/28/2015 10/31/2014 08/18/2014 06/23/2014 02/02/2014  Falls in the past year? Yes Yes Yes Yes Yes  Number falls in past yr: 2 or more 2 or more 1 1 1   Injury with Fall? No No - No Yes  Risk for fall due to : - - - History of fall(s) Other (Comment)  Risk for fall due to (comments): - - - - Fell due to not paying attention.    Cognitive Function: MMSE - Mini Mental State Exam 03/28/2015  Orientation to time 5  Orientation to Place 5  Registration 3  Attention/ Calculation 5  Recall 3  Language- name 2 objects 2  Language- repeat 1  Language- read & follow direction 1  Write a sentence 1  Copy design 1  No deficit noted  Screening Tests Health Maintenance  Topic Date Due  . ZOSTAVAX  04/28/2015 (Originally 10/09/2004)  .  TETANUS/TDAP  05/02/2015 (Originally 10/10/1965)  . INFLUENZA VACCINE  06/04/2015  . MAMMOGRAM  08/29/2015  . COLONOSCOPY  03/22/2024  . DEXA SCAN  Completed  . PNA vac Low Risk Adult  Completed      Plan:     Complete Advance Directives and bring signed copy to our office Continue to do memory games daily Walk for at least 30 minutes 3 times per week  During the course of the visit, Amber was educated and  counseled about the following appropriate screening and preventive services:   Vaccines to include Prevnar-given today, Influenza-suggested annually, Td-too expensive, Zostavax-too expensive  Electrocardiogram-Done 07/2014  Cardiovascular disease screening-Lipids monitored at routine office visit  Colorectal cancer screening-colonoscopy done 03/2014  Bone density screening-done 01/2014  Diabetes screening-Glucose monitored at routine office visits  Glaucoma screening-Checked by Dr Marin Comment at [place]  Mammography/PAP  Nutrition counseling-Balanced diet with lean proteins, fruits, and vegetables   Patient Instructions (the written plan) were given to the patient.    Ilean China, RN   04/06/2015      I have reviewed and agree with the above AWV documentation.  Charlotte Aguirre, M.D.

## 2015-04-21 ENCOUNTER — Other Ambulatory Visit: Payer: Self-pay | Admitting: Nurse Practitioner

## 2015-04-21 ENCOUNTER — Other Ambulatory Visit: Payer: Self-pay | Admitting: Family Medicine

## 2015-04-23 NOTE — Telephone Encounter (Signed)
Last seen 04/03/15  Dr Livia Snellen  Last lipid 11/01/15  Requesting 90 day supply

## 2015-05-16 ENCOUNTER — Encounter: Payer: Self-pay | Admitting: Family Medicine

## 2015-05-16 ENCOUNTER — Ambulatory Visit (INDEPENDENT_AMBULATORY_CARE_PROVIDER_SITE_OTHER): Payer: Medicare Other | Admitting: Family Medicine

## 2015-05-16 VITALS — BP 120/64 | HR 68 | Temp 97.7°F | Ht 61.0 in | Wt 181.2 lb

## 2015-05-16 DIAGNOSIS — G500 Trigeminal neuralgia: Secondary | ICD-10-CM

## 2015-05-16 DIAGNOSIS — R51 Headache: Secondary | ICD-10-CM

## 2015-05-16 DIAGNOSIS — R519 Headache, unspecified: Secondary | ICD-10-CM

## 2015-05-16 DIAGNOSIS — G609 Hereditary and idiopathic neuropathy, unspecified: Secondary | ICD-10-CM | POA: Diagnosis not present

## 2015-05-16 MED ORDER — TOPIRAMATE 25 MG PO TABS: 25.0000 mg | ORAL_TABLET | Freq: Every day | ORAL | Status: DC

## 2015-05-16 MED ORDER — GABAPENTIN 600 MG PO TABS: 1200.0000 mg | ORAL_TABLET | Freq: Two times a day (BID) | ORAL | Status: DC

## 2015-05-16 NOTE — Progress Notes (Signed)
Subjective:  Patient ID: Charlotte Aguirre, female    DOB: [birth year]  Age: 69 y.o. MRN: [id number]  CC: Facial Pain   HPI [place] presents for several months of increasing pain in the left frontal region there is point tenderness that is 8/10. This occurs daily. She does get relief with Advil but it lasts several hours per occasion. There is also point tenderness in the left cheek and in the preauricular area as well as the mastoid region. Each of these is described as sharp. Temporally they are related. Each is relieved with Advil. There is no effect on speech hearing or vision. The pain does not radiate. It is simply a sharp sensation that stays in each of these spots. The patient has a long history of migraine. She was tentatively diagnosed with trigeminal neuralgia that turned out to be a migraine variant about a year ago. That resolved for several months but has been replaced recently by these symptoms.  History  Charlotte Aguirre has a past medical history of GERD (gastroesophageal reflux disease); Fibromyalgia; Peripheral neuropathy; Arthritis; Hypertension; Hyperlipidemia; IBS (irritable bowel syndrome); Headache(784.0); Ulcer; Vitamin D insufficiency; Memory changes; Cataract; Osteopenia; and Wrist fracture (2013).   She has past surgical history that includes Cholecystectomy; Colonoscopy (N/A, 03/22/2014); Abdominal hysterectomy; and Oophorectomy.   Her family history includes COPD in her sister; Cancer in her brother and brother; Dementia in her brother; Diabetes in her sister; Glaucoma in her father; Heart attack in her brother, brother, brother, father, and sister; Heart failure in her brother, father, mother, and sister; Obesity in her sister; Osteoporosis in her sister; Thyroid disease in her son.She reports that she quit smoking about 26 years ago. Her smoking use included Cigarettes. She has never used smokeless tobacco. She reports that she does not drink alcohol or use illicit  drugs.  Outpatient Prescriptions Prior to Visit  Medication Sig Dispense Refill  . aspirin 81 MG chewable tablet Chew by mouth daily.    . calcium-vitamin D (OSCAL WITH D) 500-200 MG-UNIT per tablet Take 1 tablet by mouth 2 (two) times daily.    . Cholecalciferol (VITAMIN D-3 PO) Take 1 tablet by mouth 2 (two) times daily.     . fluticasone (FLONASE) 50 MCG/ACT nasal spray Place 2 sprays into both nostrils daily. 16 g 6  . hydrochlorothiazide (HYDRODIURIL) 25 MG tablet Take 1 tablet by mouth  daily 90 tablet 1  . Multiple Vitamins-Minerals (MULTIVITAMINS THER. W/MINERALS) TABS tablet Take 1 tablet by mouth daily.    . Omega-3 Fatty Acids (FISH OIL) 500 MG CAPS Take 2 capsules by mouth 2 (two) times daily.     . simvastatin (ZOCOR) 40 MG tablet Take 1 tablet by mouth  daily 90 tablet 0  . gabapentin (NEURONTIN) 300 MG capsule Take 2 capsules by mouth 3  times daily 540 capsule 0  . pantoprazole (PROTONIX) 40 MG tablet Take 1 tablet (40 mg total) by mouth daily. 30 tablet 3  . predniSONE (DELTASONE) 10 MG tablet Take 5 daily for 3 days followed by 4,3,2 and 1 for 3 days each. 45 tablet 0   No facility-administered medications prior to visit.    ROS Review of Systems  Constitutional: Negative for fever, chills, diaphoresis, appetite change, fatigue and unexpected weight change.  HENT: Negative for congestion, ear pain, hearing loss, postnasal drip, rhinorrhea, sneezing, sore throat and trouble swallowing.   Eyes: Negative for pain.  Respiratory: Negative for cough, chest tightness and shortness of breath.   Cardiovascular:  Negative for chest pain and palpitations.  Gastrointestinal: Negative for nausea, vomiting, abdominal pain, diarrhea and constipation.  Genitourinary: Negative for dysuria, frequency and menstrual problem.  Musculoskeletal: Negative for joint swelling and arthralgias.  Skin: Negative for rash.  Neurological: Negative for dizziness, weakness, numbness and headaches.   Psychiatric/Behavioral: Negative for dysphoric mood and agitation.     Subjective:  Patient ID: Charlotte Aguirre, female    DOB: [birth year]  Age: 69 y.o. MRN: [id number]  CC: Facial Pain   HPI [place] presents for several months of increasing pain in the left frontal region there is point tenderness that is 8/10. This occurs daily. She does get relief with Advil but it lasts several hours per occasion. There is also point tenderness in the left cheek and in the preauricular area as well as the mastoid region. Each of these is described as sharp. Temporally they are related. Each is relieved with Advil. There is no effect on speech hearing or vision. The pain does not radiate. It is simply a sharp sensation that stays in each of these spots. The patient has a long history of migraine. She was tentatively diagnosed with trigeminal neuralgia that turned out to be a migraine variant about a year ago. That resolved for several months but has been replaced recently by these symptoms.   History Charlotte Aguirre has a past medical history of GERD (gastroesophageal reflux disease); Fibromyalgia; Peripheral neuropathy; Arthritis; Hypertension; Hyperlipidemia; IBS (irritable bowel syndrome); Headache(784.0); Ulcer; Vitamin D insufficiency; Memory changes; Cataract; Osteopenia; and Wrist fracture (2013).   She has past surgical history that includes Cholecystectomy; Colonoscopy (N/A, 03/22/2014); Abdominal hysterectomy; and Oophorectomy.   Her family history includes COPD in her sister; Cancer in her brother and brother; Dementia in her brother; Diabetes in her sister; Glaucoma in her father; Heart attack in her brother, brother, brother, father, and sister; Heart failure in her brother, father, mother, and sister; Obesity in her sister; Osteoporosis in her sister; Thyroid disease in her son.She reports that she quit smoking about 26 years ago. Her smoking use included Cigarettes. She has never used smokeless tobacco. She  reports that she does not drink alcohol or use illicit drugs.  Outpatient Prescriptions Prior to Visit  Medication Sig Dispense Refill  . aspirin 81 MG chewable tablet Chew by mouth daily.    . calcium-vitamin D (OSCAL WITH D) 500-200 MG-UNIT per tablet Take 1 tablet by mouth 2 (two) times daily.    . Cholecalciferol (VITAMIN D-3 PO) Take 1 tablet by mouth 2 (two) times daily.     . fluticasone (FLONASE) 50 MCG/ACT nasal spray Place 2 sprays into both nostrils daily. 16 g 6  . hydrochlorothiazide (HYDRODIURIL) 25 MG tablet Take 1 tablet by mouth  daily 90 tablet 1  . Multiple Vitamins-Minerals (MULTIVITAMINS THER. W/MINERALS) TABS tablet Take 1 tablet by mouth daily.    . Omega-3 Fatty Acids (FISH OIL) 500 MG CAPS Take 2 capsules by mouth 2 (two) times daily.     . simvastatin (ZOCOR) 40 MG tablet Take 1 tablet by mouth  daily 90 tablet 0  . gabapentin (NEURONTIN) 300 MG capsule Take 2 capsules by mouth 3  times daily 540 capsule 0  . pantoprazole (PROTONIX) 40 MG tablet Take 1 tablet (40 mg total) by mouth daily. 30 tablet 3  . predniSONE (DELTASONE) 10 MG tablet Take 5 daily for 3 days followed by 4,3,2 and 1 for 3 days each. 45 tablet 0   No facility-administered medications prior to  visit.    ROS Review of Systems  Constitutional: Negative for fever, chills, diaphoresis, appetite change, fatigue and unexpected weight change.  HENT: Negative for congestion, ear pain, hearing loss, postnasal drip, rhinorrhea, sneezing, sore throat and trouble swallowing.   Eyes: Negative for pain.  Respiratory: Negative for cough, chest tightness and shortness of breath.   Cardiovascular: Negative for chest pain and palpitations.  Gastrointestinal: Negative for nausea, vomiting, abdominal pain, diarrhea and constipation.  Genitourinary: Negative for dysuria, frequency and menstrual problem.  Musculoskeletal: Negative for joint swelling and arthralgias.  Skin: Negative for rash.  Neurological: Positive  for headaches. Negative for dizziness, syncope, weakness and numbness.  Psychiatric/Behavioral: Negative for dysphoric mood and agitation.    Objective:  BP 120/64 mmHg  Pulse 68  Temp(Src) 97.7 F (36.5 C) (Oral)  Ht 5\' 1"  (1.549 m)  Wt 181 lb 3.2 oz (82.192 kg)  BMI 34.26 kg/m2  BP Readings from Last 3 Encounters:  05/16/15 120/64  04/03/15 119/70  03/28/15 124/70    Wt Readings from Last 3 Encounters:  05/16/15 181 lb 3.2 oz (82.192 kg)  04/03/15 180 lb 12.8 oz (82.01 kg)  03/28/15 181 lb (82.101 kg)     Physical Exam  Constitutional: She is oriented to person, place, and time. She appears well-developed and well-nourished. No distress.  HENT:  Head: Normocephalic and atraumatic.  Right Ear: External ear normal.  Left Ear: External ear normal.  Nose: Nose normal.  Mouth/Throat: Oropharynx is clear and moist.  Eyes: Conjunctivae and EOM are normal. Pupils are equal, round, and reactive to light.  Neck: Normal range of motion. Neck supple. No thyromegaly present.  Cardiovascular: Normal rate, regular rhythm and normal heart sounds.   No murmur heard. Pulmonary/Chest: Effort normal and breath sounds normal. No respiratory distress. She has no wheezes. She has no rales.  Abdominal: Soft. Bowel sounds are normal. She exhibits no distension. There is no tenderness.  Lymphadenopathy:    She has no cervical adenopathy.  Neurological: She is alert and oriented to person, place, and time. She has normal reflexes.  Skin: Skin is warm and dry.  Psychiatric: She has a normal mood and affect. Her behavior is normal. Judgment and thought content normal.    Lab Results  Component Value Date   HGBA1C  02/16/2009    5.6 (NOTE) The ADA recommends the following therapeutic goal for glycemic control related to Hgb A1c measurement: Goal of therapy: <6.5 Hgb A1c  Reference: American Diabetes Association: Clinical Practice Recommendations 2010, Diabetes Care, 2010, 33: (Suppl  1).     Lab Results  Component Value Date   WBC 8.8 04/03/2015   HGB 13.5 04/03/2015   HCT 43.1 04/03/2015   PLT 214 10/31/2010   GLUCOSE 95 04/03/2015   Aguirre 156 10/31/2014   TRIG 217* 10/31/2014   HDL 38* 10/31/2014   LDLCALC 91 02/02/2014   ALT 6 04/03/2015   AST 15 04/03/2015   NA 144 04/03/2015   K 3.9 04/03/2015   CL 99 04/03/2015   CREATININE 0.85 04/03/2015   BUN 13 04/03/2015   CO2 30* 04/03/2015   TSH 1.680 10/31/2014   HGBA1C  02/16/2009    5.6 (NOTE) The ADA recommends the following therapeutic goal for glycemic control related to Hgb A1c measurement: Goal of therapy: <6.5 Hgb A1c  Reference: American Diabetes Association: Clinical Practice Recommendations 2010, Diabetes Care, 2010, 33: (Suppl  1).     Subjective:  Patient ID: Charlotte Aguirre, female    DOB:  30-Jul-1946  Age: 69 y.o. MRN: [id number]  CC: Facial Pain   HPI [place] presents for several months of increasing pain in the left frontal region there is point tenderness that is 8/10. This occurs daily. She does get relief with Advil but it lasts several hours per occasion. There is also point tenderness in the left cheek and in the preauricular area as well as the mastoid region. Each of these is described as sharp. Temporally they are related. Each is relieved with Advil. There is no effect on speech hearing or vision. The pain does not radiate. It is simply a sharp sensation that stays in each of these spots. The patient has a long history of migraine. She was tentatively diagnosed with trigeminal neuralgia that turned out to be a migraine variant about a year ago. That resolved for several months but has been replaced recently by these symptoms.   History [place] has a past medical history of GERD (gastroesophageal reflux disease); Fibromyalgia; Peripheral neuropathy; Arthritis; Hypertension; Hyperlipidemia; IBS (irritable bowel syndrome); Headache(784.0); Ulcer; Vitamin D insufficiency; Memory changes; Cataract;  Osteopenia; and Wrist fracture (2013).   She has past surgical history that includes Cholecystectomy; Colonoscopy (N/A, 03/22/2014); Abdominal hysterectomy; and Oophorectomy.   Her family history includes COPD in her sister; Cancer in her brother and brother; Dementia in her brother; Diabetes in her sister; Glaucoma in her father; Heart attack in her brother, brother, brother, father, and sister; Heart failure in her brother, father, mother, and sister; Obesity in her sister; Osteoporosis in her sister; Thyroid disease in her son.She reports that she quit smoking about 26 years ago. Her smoking use included Cigarettes. She has never used smokeless tobacco. She reports that she does not drink alcohol or use illicit drugs.  Outpatient Prescriptions Prior to Visit  Medication Sig Dispense Refill  . aspirin 81 MG chewable tablet Chew by mouth daily.    . calcium-vitamin D (OSCAL WITH D) 500-200 MG-UNIT per tablet Take 1 tablet by mouth 2 (two) times daily.    . Cholecalciferol (VITAMIN D-3 PO) Take 1 tablet by mouth 2 (two) times daily.     . fluticasone (FLONASE) 50 MCG/ACT nasal spray Place 2 sprays into both nostrils daily. 16 g 6  . hydrochlorothiazide (HYDRODIURIL) 25 MG tablet Take 1 tablet by mouth  daily 90 tablet 1  . Multiple Vitamins-Minerals (MULTIVITAMINS THER. W/MINERALS) TABS tablet Take 1 tablet by mouth daily.    . Omega-3 Fatty Acids (FISH OIL) 500 MG CAPS Take 2 capsules by mouth 2 (two) times daily.     . simvastatin (ZOCOR) 40 MG tablet Take 1 tablet by mouth  daily 90 tablet 0  . gabapentin (NEURONTIN) 300 MG capsule Take 2 capsules by mouth 3  times daily 540 capsule 0  . pantoprazole (PROTONIX) 40 MG tablet Take 1 tablet (40 mg total) by mouth daily. 30 tablet 3  . predniSONE (DELTASONE) 10 MG tablet Take 5 daily for 3 days followed by 4,3,2 and 1 for 3 days each. 45 tablet 0   No facility-administered medications prior to visit.    ROS Review of Systems  Constitutional:  Negative for fever, chills, diaphoresis, appetite change, fatigue and unexpected weight change.  HENT: Negative for congestion, ear pain, hearing loss, postnasal drip, rhinorrhea, sneezing, sore throat and trouble swallowing.   Eyes: Negative for pain.  Respiratory: Negative for cough, chest tightness and shortness of breath.   Cardiovascular: Negative for chest pain and palpitations.  Gastrointestinal: Negative for nausea, vomiting,  abdominal pain, diarrhea and constipation.  Genitourinary: Negative for dysuria, frequency and menstrual problem.  Musculoskeletal: Negative for joint swelling and arthralgias.  Skin: Negative for rash.  Neurological: Positive for headaches. Negative for dizziness, syncope, weakness and numbness.  Psychiatric/Behavioral: Negative for dysphoric mood and agitation.    Objective:  BP 120/64 mmHg  Pulse 68  Temp(Src) 97.7 F (36.5 C) (Oral)  Ht 5\' 1"  (1.549 m)  Wt 181 lb 3.2 oz (82.192 kg)  BMI 34.26 kg/m2  BP Readings from Last 3 Encounters:  05/16/15 120/64  04/03/15 119/70  03/28/15 124/70    Wt Readings from Last 3 Encounters:  05/16/15 181 lb 3.2 oz (82.192 kg)  04/03/15 180 lb 12.8 oz (82.01 kg)  03/28/15 181 lb (82.101 kg)     Physical Exam  Constitutional: She is oriented to person, place, and time. She appears well-developed and well-nourished. No distress.  HENT:  Head: Normocephalic and atraumatic.  Right Ear: External ear normal.  Left Ear: External ear normal.  Nose: Nose normal.  Mouth/Throat: Oropharynx is clear and moist.  Eyes: Conjunctivae and EOM are normal. Pupils are equal, round, and reactive to light.  Neck: Normal range of motion. Neck supple. No thyromegaly present.  Cardiovascular: Normal rate, regular rhythm and normal heart sounds.   No murmur heard. Pulmonary/Chest: Effort normal and breath sounds normal. No respiratory distress. She has no wheezes. She has no rales.  Abdominal: Soft. Bowel sounds are normal. She  exhibits no distension. There is no tenderness.  Lymphadenopathy:    She has no cervical adenopathy.  Neurological: She is alert and oriented to person, place, and time. She has normal reflexes.  Skin: Skin is warm and dry.  Psychiatric: She has a normal mood and affect. Her behavior is normal. Judgment and thought content normal.    Lab Results  Component Value Date   HGBA1C  02/16/2009    5.6 (NOTE) The ADA recommends the following therapeutic goal for glycemic control related to Hgb A1c measurement: Goal of therapy: <6.5 Hgb A1c  Reference: American Diabetes Association: Clinical Practice Recommendations 2010, Diabetes Care, 2010, 33: (Suppl  1).    Lab Results  Component Value Date   WBC 8.8 04/03/2015   HGB 13.5 04/03/2015   HCT 43.1 04/03/2015   PLT 214 10/31/2010   GLUCOSE 95 04/03/2015   Aguirre 156 10/31/2014   TRIG 217* 10/31/2014   HDL 38* 10/31/2014   LDLCALC 91 02/02/2014   ALT 6 04/03/2015   AST 15 04/03/2015   NA 144 04/03/2015   K 3.9 04/03/2015   CL 99 04/03/2015   CREATININE 0.85 04/03/2015   BUN 13 04/03/2015   CO2 30* 04/03/2015   TSH 1.680 10/31/2014   HGBA1C  02/16/2009    5.6 (NOTE) The ADA recommends the following therapeutic goal for glycemic control related to Hgb A1c measurement: Goal of therapy: <6.5 Hgb A1c  Reference: American Diabetes Association: Clinical Practice Recommendations 2010, Diabetes Care, 2010, 33: (Suppl  1).    US Transvaginal Non-ob  11/15/2014   CLINICAL DATA:  Pelvic pain.  EXAM: TRANSABDOMINAL AND TRANSVAGINAL ULTRASOUND OF PELVIS  TECHNIQUE: Both transabdominal and transvaginal ultrasound examinations of the pelvis were performed. Transabdominal technique was performed for global imaging of the pelvis including uterus, ovaries, adnexal regions, and pelvic cul-de-sac. It was necessary to proceed with endovaginal exam following the transabdominal exam to visualize the adnexa and pelvis.  COMPARISON:  None  FINDINGS: Uterus   Hysterectomy.  Right ovary  Oophorectomy.  Left  ovary  Oophorectomy.  Other findings  No free fluid.  IMPRESSION: Prior hysterectomy and bilateral oophorectomy. No focal abnormality identified. No pelvic mass lesions or fluid collections noted.   Electronically Signed   By: Marcello Moores  Register   On: 11/15/2014 12:44   US Pelvis Complete  11/15/2014   CLINICAL DATA:  Pelvic pain.  EXAM: TRANSABDOMINAL AND TRANSVAGINAL ULTRASOUND OF PELVIS  TECHNIQUE: Both transabdominal and transvaginal ultrasound examinations of the pelvis were performed. Transabdominal technique was performed for global imaging of the pelvis including uterus, ovaries, adnexal regions, and pelvic cul-de-sac. It was necessary to proceed with endovaginal exam following the transabdominal exam to visualize the adnexa and pelvis.  COMPARISON:  None  FINDINGS: Uterus  Hysterectomy.  Right ovary  Oophorectomy.  Left ovary  Oophorectomy.  Other findings  No free fluid.  IMPRESSION: Prior hysterectomy and bilateral oophorectomy. No focal abnormality identified. No pelvic mass lesions or fluid collections noted.   Electronically Signed   By: Marcello Moores  Register   On: 11/15/2014 12:44    Assessment & Plan:   [place] was seen today for facial pain.  Diagnoses and all orders for this visit:  Left facial pain Orders: -     MR Brain/IAC Wo/W Cm; Future -     topiramate (TOPAMAX) 25 MG tablet; Take 1 tablet (25 mg total) by mouth daily. Take 1 daily for 1 week then 2 daily for 1 week then 3 daily for 1 week then 4 daily. Take them together at bedtime  Trigeminal neuralgia syndrome  Hereditary and idiopathic peripheral neuropathy  Other orders -     gabapentin (NEURONTIN) 600 MG tablet; Take 2 tablets (1,200 mg total) by mouth 2 (two) times daily.   I have discontinued Ms. Cortner's predniSONE and gabapentin. I am also having her start on gabapentin and topiramate. Additionally, I am having her maintain her aspirin, Cholecalciferol (VITAMIN D-3 PO),  Fish Oil, multivitamins ther. w/minerals, fluticasone, calcium-vitamin D, simvastatin, and hydrochlorothiazide.  Meds ordered this encounter  Medications  . gabapentin (NEURONTIN) 600 MG tablet    Sig: Take 2 tablets (1,200 mg total) by mouth 2 (two) times daily.    Dispense:  120 tablet    Refill:  2  . topiramate (TOPAMAX) 25 MG tablet    Sig: Take 1 tablet (25 mg total) by mouth daily. Take 1 daily for 1 week then 2 daily for 1 week then 3 daily for 1 week then 4 daily. Take them together at bedtime    Dispense:  120 tablet    Refill:  0     Follow-up: Return in about 1 month (around 06/16/2015).  [name], M.D.    Assessment & Plan:   [place] was seen today for facial pain.  Diagnoses and all orders for this visit:  Left facial pain Orders: -     MR Brain/IAC Wo/W Cm; Future -     topiramate (TOPAMAX) 25 MG tablet; Take 1 tablet (25 mg total) by mouth daily. Take 1 daily for 1 week then 2 daily for 1 week then 3 daily for 1 week then 4 daily. Take them together at bedtime  Trigeminal neuralgia syndrome  Hereditary and idiopathic peripheral neuropathy  Other orders -     gabapentin (NEURONTIN) 600 MG tablet; Take 2 tablets (1,200 mg total) by mouth 2 (two) times daily.   I have discontinued Ms. Kersten's predniSONE and gabapentin. I am also having her start on gabapentin and topiramate. Additionally, I am having her maintain her  aspirin, Cholecalciferol (VITAMIN D-3 PO), Fish Oil, multivitamins ther. w/minerals, fluticasone, calcium-vitamin D, simvastatin, and hydrochlorothiazide.  Meds ordered this encounter  Medications  . gabapentin (NEURONTIN) 600 MG tablet    Sig: Take 2 tablets (1,200 mg total) by mouth 2 (two) times daily.    Dispense:  120 tablet    Refill:  2  . topiramate (TOPAMAX) 25 MG tablet    Sig: Take 1 tablet (25 mg total) by mouth daily. Take 1 daily for 1 week then 2 daily for 1 week then 3 daily for 1 week then 4 daily. Take them together at  bedtime    Dispense:  120 tablet    Refill:  0     Follow-up: Return in about 1 month (around 06/16/2015).  [name], M.D.   Objective:  BP 120/64 mmHg  Pulse 68  Temp(Src) 97.7 F (36.5 C) (Oral)  Ht 5\' 1"  (1.549 m)  Wt 181 lb 3.2 oz (82.192 kg)  BMI 34.26 kg/m2  BP Readings from Last 3 Encounters:  05/16/15 120/64  04/03/15 119/70  03/28/15 124/70    Wt Readings from Last 3 Encounters:  05/16/15 181 lb 3.2 oz (82.192 kg)  04/03/15 180 lb 12.8 oz (82.01 kg)  03/28/15 181 lb (82.101 kg)     Physical Exam  Constitutional: She is oriented to person, place, and time. She appears well-developed and well-nourished. No distress.  HENT:  Head: Normocephalic and atraumatic.  Right Ear: External ear normal.  Left Ear: External ear normal.  Nose: Nose normal.  Mouth/Throat: Oropharynx is clear and moist.  Eyes: Conjunctivae and EOM are normal. Pupils are equal, round, and reactive to light.  Neck: Normal range of motion. Neck supple. No thyromegaly present.  Cardiovascular: Normal rate, regular rhythm and normal heart sounds.   No murmur heard. Pulmonary/Chest: Effort normal and breath sounds normal. No respiratory distress. She has no wheezes. She has no rales.  Abdominal: Soft. Bowel sounds are normal. She exhibits no distension. There is no tenderness.  Lymphadenopathy:    She has no cervical adenopathy.  Neurological: She is alert and oriented to person, place, and time. She has normal reflexes.  Skin: Skin is warm and dry.  Psychiatric: She has a normal mood and affect. Her behavior is normal. Judgment and thought content normal.    Lab Results  Component Value Date   HGBA1C  02/16/2009    5.6 (NOTE) The ADA recommends the following therapeutic goal for glycemic control related to Hgb A1c measurement: Goal of therapy: <6.5 Hgb A1c  Reference: American Diabetes Association: Clinical Practice Recommendations 2010, Diabetes Care, 2010, 33: (Suppl  1).    Lab  Results  Component Value Date   WBC 8.8 04/03/2015   HGB 13.5 04/03/2015   HCT 43.1 04/03/2015   PLT 214 10/31/2010   GLUCOSE 95 04/03/2015   Aguirre 156 10/31/2014   TRIG 217* 10/31/2014   HDL 38* 10/31/2014   LDLCALC 91 02/02/2014   ALT 6 04/03/2015   AST 15 04/03/2015   NA 144 04/03/2015   K 3.9 04/03/2015   CL 99 04/03/2015   CREATININE 0.85 04/03/2015   BUN 13 04/03/2015   CO2 30* 04/03/2015   TSH 1.680 10/31/2014   HGBA1C  02/16/2009    5.6 (NOTE) The ADA recommends the following therapeutic goal for glycemic control related to Hgb A1c measurement: Goal of therapy: <6.5 Hgb A1c  Reference: American Diabetes Association: Clinical Practice Recommendations 2010, Diabetes Care, 2010, 33: (Suppl  1).  US Transvaginal Non-ob  11/15/2014   CLINICAL DATA:  Pelvic pain.  EXAM: TRANSABDOMINAL AND TRANSVAGINAL ULTRASOUND OF PELVIS  TECHNIQUE: Both transabdominal and transvaginal ultrasound examinations of the pelvis were performed. Transabdominal technique was performed for global imaging of the pelvis including uterus, ovaries, adnexal regions, and pelvic cul-de-sac. It was necessary to proceed with endovaginal exam following the transabdominal exam to visualize the adnexa and pelvis.  COMPARISON:  None  FINDINGS: Uterus  Hysterectomy.  Right ovary  Oophorectomy.  Left ovary  Oophorectomy.  Other findings  No free fluid.  IMPRESSION: Prior hysterectomy and bilateral oophorectomy. No focal abnormality identified. No pelvic mass lesions or fluid collections noted.   Electronically Signed   By: Marcello Moores  Register   On: 11/15/2014 12:44   US Pelvis Complete  11/15/2014   CLINICAL DATA:  Pelvic pain.  EXAM: TRANSABDOMINAL AND TRANSVAGINAL ULTRASOUND OF PELVIS  TECHNIQUE: Both transabdominal and transvaginal ultrasound examinations of the pelvis were performed. Transabdominal technique was performed for global imaging of the pelvis including uterus, ovaries, adnexal regions, and pelvic cul-de-sac.  It was necessary to proceed with endovaginal exam following the transabdominal exam to visualize the adnexa and pelvis.  COMPARISON:  None  FINDINGS: Uterus  Hysterectomy.  Right ovary  Oophorectomy.  Left ovary  Oophorectomy.  Other findings  No free fluid.  IMPRESSION: Prior hysterectomy and bilateral oophorectomy. No focal abnormality identified. No pelvic mass lesions or fluid collections noted.   Electronically Signed   By: Marcello Moores  Register   On: 11/15/2014 12:44    Assessment & Plan:   Vandy was seen today for facial pain.  Diagnoses and all orders for this visit:  Left facial pain Orders: -     MR Brain/IAC Wo/W Cm; Future -     topiramate (TOPAMAX) 25 MG tablet; Take 1 tablet (25 mg total) by mouth daily. Take 1 daily for 1 week then 2 daily for 1 week then 3 daily for 1 week then 4 daily. Take them together at bedtime  Trigeminal neuralgia syndrome  Hereditary and idiopathic peripheral neuropathy  Other orders -     gabapentin (NEURONTIN) 600 MG tablet; Take 2 tablets (1,200 mg total) by mouth 2 (two) times daily.   I have discontinued Ms. Flannigan's predniSONE and gabapentin. I am also having her start on gabapentin and topiramate. Additionally, I am having her maintain her aspirin, Cholecalciferol (VITAMIN D-3 PO), Fish Oil, multivitamins ther. w/minerals, fluticasone, calcium-vitamin D, simvastatin, and hydrochlorothiazide.  Meds ordered this encounter  Medications  . gabapentin (NEURONTIN) 600 MG tablet    Sig: Take 2 tablets (1,200 mg total) by mouth 2 (two) times daily.    Dispense:  120 tablet    Refill:  2  . topiramate (TOPAMAX) 25 MG tablet    Sig: Take 1 tablet (25 mg total) by mouth daily. Take 1 daily for 1 week then 2 daily for 1 week then 3 daily for 1 week then 4 daily. Take them together at bedtime    Dispense:  120 tablet    Refill:  0     Follow-up: Return in about 1 month (around 06/16/2015).  [name], M.D.

## 2015-05-21 ENCOUNTER — Other Ambulatory Visit: Payer: Self-pay

## 2015-05-21 MED ORDER — PANTOPRAZOLE SODIUM 40 MG PO TBEC: 40.0000 mg | DELAYED_RELEASE_TABLET | Freq: Every day | ORAL | Status: DC

## 2015-05-23 ENCOUNTER — Telehealth: Payer: Self-pay | Admitting: Family Medicine

## 2015-05-23 NOTE — Telephone Encounter (Signed)
Directions given to patient

## 2015-05-31 ENCOUNTER — Other Ambulatory Visit: Payer: Self-pay | Admitting: Family Medicine

## 2015-06-01 ENCOUNTER — Other Ambulatory Visit: Payer: Self-pay | Admitting: Family Medicine

## 2015-06-01 DIAGNOSIS — G500 Trigeminal neuralgia: Secondary | ICD-10-CM

## 2015-06-11 ENCOUNTER — Encounter: Payer: Self-pay | Admitting: Family Medicine

## 2015-06-11 ENCOUNTER — Telehealth: Payer: Self-pay | Admitting: Family Medicine

## 2015-06-11 ENCOUNTER — Ambulatory Visit (INDEPENDENT_AMBULATORY_CARE_PROVIDER_SITE_OTHER): Payer: Medicare Other | Admitting: Family Medicine

## 2015-06-11 VITALS — BP 141/79 | HR 75 | Temp 97.7°F | Ht 61.0 in | Wt 181.6 lb

## 2015-06-11 DIAGNOSIS — T887XXA Unspecified adverse effect of drug or medicament, initial encounter: Secondary | ICD-10-CM | POA: Diagnosis not present

## 2015-06-11 DIAGNOSIS — T50905A Adverse effect of unspecified drugs, medicaments and biological substances, initial encounter: Secondary | ICD-10-CM | POA: Insufficient documentation

## 2015-06-11 DIAGNOSIS — R519 Headache, unspecified: Secondary | ICD-10-CM

## 2015-06-11 DIAGNOSIS — R51 Headache: Secondary | ICD-10-CM

## 2015-06-11 MED ORDER — TOPIRAMATE 25 MG PO TABS: ORAL_TABLET | ORAL | Status: DC

## 2015-06-11 NOTE — Progress Notes (Signed)
Patient ID: Charlotte Aguirre, female   DOB: 12-14-1945, 69 y.o.   MRN: [id number]   HPI  Patient presents today for same-day appointment for medication reaction.  Patient explains that she's been having severe left-sided facial pain and headaches for several months. She states she was started on Topamax and gabapentin was increased to 1200 mg twice daily at her last appointment.  About 2 weeks ago she began having visual hallucinations including seeing rats on the wall and seeing people looking for the windows when she knows that they're not there. She denies auditory hallucinations. She denies suicidal ideations. She states that it has made her feel depressed. She also expresses paranoia and she is very concerned that she will be committed for this.  She states that she's been more sleepy than usual. She also states that the Topamax does not seem to be helping her headaches. He states the gabapentin is helping but is not completely resolving her headaches.  PMH: Smoking status noted ROS: Per HPI  Objective: BP 141/79 mmHg  Pulse 75  Temp(Src) 97.7 F (36.5 C) (Oral)  Ht 5\' 1"  (1.549 m)  Wt 181 lb 9.6 oz (82.373 kg)  BMI 34.33 kg/m2 Gen: NAD, alert, cooperative with exam HEENT: NCAT CV: RRR, good S1/S2, no murmur Resp: CTABL, no wheezes, non-labored Neuro: Alert and oriented, strength 5/5 in bilateral upper and lower extremities, cranial nerves II through XII intact, 2+ patellar and brachioradialis reflexes  Assessment and plan:  Medication adverse effect Patient explains that she's having visual hallucinations and paranoia, she's seeing rats crawling the wall, as well as strangers looking to the window when she knows that now it is there. She attributes this to the Topamax and would like to stop Directions given to dc topamax, I encouraged her to keep her MRI appt and f/u with Dr. Livia Snellen    Meds ordered this encounter  Medications  . topiramate (TOPAMAX) 25 MG tablet    Sig: Take  3 tablets daily for 1 week, then decrease to 2 tablets daily for 1 week then decrease to 1 tablet daily for 1 week then stop.    Dispense:  42 tablet    Refill:  0

## 2015-06-11 NOTE — Telephone Encounter (Signed)
Call given to nurse °

## 2015-06-11 NOTE — Patient Instructions (Signed)
Great to meet you!   Be sure to keep your appointment for the MRI Friday.   Decrease your topamax like this:  Go to 3 tablets for 7 days  Then 2 tablets for 7 days Then 1 tablet for 7 days then stop

## 2015-06-11 NOTE — Assessment & Plan Note (Signed)
Patient explains that she's having visual hallucinations and paranoia, she's seeing rats crawling the wall, as well as strangers looking to the window when she knows that now it is there. She attributes this to the Topamax and would like to stop Directions given to dc topamax, I encouraged her to keep her MRI appt and f/u with Dr. Livia Snellen

## 2015-06-13 ENCOUNTER — Other Ambulatory Visit (HOSPITAL_COMMUNITY): Payer: Medicare Other

## 2015-06-15 ENCOUNTER — Ambulatory Visit
Admission: RE | Admit: 2015-06-15 | Discharge: 2015-06-15 | Disposition: A | Discharge status: Home / Self Care | Payer: Medicare Other | Source: Ambulatory Visit | Attending: Family Medicine | Admitting: Family Medicine

## 2015-06-15 DIAGNOSIS — R519 Headache, unspecified: Secondary | ICD-10-CM

## 2015-06-15 DIAGNOSIS — G44009 Cluster headache syndrome, unspecified, not intractable: Secondary | ICD-10-CM | POA: Diagnosis not present

## 2015-06-15 DIAGNOSIS — G500 Trigeminal neuralgia: Secondary | ICD-10-CM | POA: Diagnosis not present

## 2015-06-15 DIAGNOSIS — R51 Headache: Principal | ICD-10-CM

## 2015-06-15 MED ORDER — GADOBENATE DIMEGLUMINE 529 MG/ML IV SOLN
14.0000 mL | Freq: Once | INTRAVENOUS | Status: AC | PRN
  Administered 2015-06-15: 14 mL via INTRAVENOUS

## 2015-06-18 ENCOUNTER — Ambulatory Visit: Payer: Medicare Other | Admitting: Family Medicine

## 2015-06-19 ENCOUNTER — Ambulatory Visit (INDEPENDENT_AMBULATORY_CARE_PROVIDER_SITE_OTHER): Payer: Medicare Other | Admitting: Family

## 2015-06-19 ENCOUNTER — Encounter: Payer: Self-pay | Admitting: Family

## 2015-06-19 VITALS — BP 119/65 | HR 71 | Temp 97.9°F | Ht 61.0 in | Wt 178.6 lb

## 2015-06-19 DIAGNOSIS — B370 Candidal stomatitis: Secondary | ICD-10-CM

## 2015-06-19 MED ORDER — NYSTATIN 100000 UNIT/ML MT SUSP: 5.0000 mL | Freq: Four times a day (QID) | OROMUCOSAL | Status: DC

## 2015-06-19 NOTE — Progress Notes (Signed)
   Subjective:    Patient ID: Charlotte Aguirre, female    DOB: 04-Feb-1946, 69 y.o.   MRN: [id number]  HPI Pt presents to office for her "mouth burning" her. PT states she went to her dentists months ago and was told she had a "fungus" in her mouth. Pt states she was not given medications but was told not to sleep with her dentures. Pt's states the top of her mouth has improved. Pt also reports a "werid taste" that started a few weeks ago.    Review of Systems  Constitutional: Negative.   HENT: Negative.   Eyes: Negative.   Respiratory: Negative.  Negative for shortness of breath.   Cardiovascular: Negative.  Negative for palpitations.  Gastrointestinal: Negative.   Endocrine: Negative.   Genitourinary: Negative.   Musculoskeletal: Negative.   Neurological: Negative.  Negative for headaches.  Hematological: Negative.   Psychiatric/Behavioral: Negative.   All other systems reviewed and are negative.      Objective:   Physical Exam  Constitutional: She is oriented to person, place, and time. She appears well-developed and well-nourished. No distress.  HENT:  Head: Normocephalic and atraumatic.  Right Ear: External ear normal.  hard palate slightly erythemas, bilateral lips cracked  Eyes: Pupils are equal, round, and reactive to light.  Neck: Normal range of motion. Neck supple. No thyromegaly present.  Cardiovascular: Normal rate, regular rhythm, normal heart sounds and intact distal pulses.   No murmur heard. Pulmonary/Chest: Effort normal and breath sounds normal. No respiratory distress. She has no wheezes.  Abdominal: Soft. Bowel sounds are normal. She exhibits no distension. There is no tenderness.  Musculoskeletal: Normal range of motion. She exhibits no edema or tenderness.  Neurological: She is alert and oriented to person, place, and time. She has normal reflexes. No cranial nerve deficit.  Skin: Skin is warm and dry.  Psychiatric: She has a normal mood and affect. Her  behavior is normal. Judgment and thought content normal.  Vitals reviewed.  BP 119/65 mmHg  Pulse 71  Temp(Src) 97.9 F (36.6 C) (Oral)  Ht 5\' 1"  (1.549 m)  Wt 178 lb 9.6 oz (81.012 kg)  BMI 33.76 kg/m2        Assessment & Plan:  1. Oral thrush -Rinse your mouth several times a day with warm saltwater rinse -Continue to remove dentures before bed -Encourage eating plain yogurt -RTO prn - nystatin (MYCOSTATIN) 100000 UNIT/ML suspension; Take 5 mLs (500,000 Units total) by mouth 4 (four) times daily.  Dispense: 60 mL; Refill: 0  Evelina Dun, FNP

## 2015-06-19 NOTE — Patient Instructions (Signed)
Thrush, Adult  Thrush, also called oral candidiasis, is a fungal infection that develops in the mouth and throat and on the tongue. It causes white patches to form on the mouth and tongue. Thrush is most common in older adults, but it can occur at any age.  Many cases of thrush are mild, but this infection can also be more serious. Thrush can be a recurring problem for people who have chronic illnesses or who take medicines that limit the body's ability to fight infection. Because these people have difficulty fighting infections, the fungus that causes thrush can spread throughout the body. This can cause life-threatening blood or organ infections. CAUSES  Thrush is usually caused by a yeast called Candida albicans. This fungus is normally present in small amounts in the mouth and on other mucous membranes. It usually causes no harm. However, when conditions are present that allow the fungus to grow uncontrolled, it invades surrounding tissues and becomes an infection. Less often, other Candida species can also lead to thrush.  RISK FACTORS Thrush is more likely to develop in the following people:  People with an impaired ability to fight infection (weakened immune system).   Older adults.   People with HIV.   People with diabetes.   People with dry mouth (xerostomia).   Pregnant women.   People with poor dental care, especially those who have false teeth.   People who use antibiotic medicines.  SIGNS AND SYMPTOMS  Thrush can be a mild infection that causes no symptoms. If symptoms develop, they may include:   A burning feeling in the mouth and throat. This can occur at the start of a thrush infection.   White patches that adhere to the mouth and tongue. The tissue around the patches may be red, raw, and painful. If rubbed (during tooth brushing, for example), the patches and the tissue of the mouth may bleed easily.   A bad taste in the mouth or difficulty tasting foods.    Cottony feeling in the mouth.   Pain during eating and swallowing. DIAGNOSIS  Your health care provider can usually diagnose thrush by looking in your mouth and asking you questions about your health.  TREATMENT  Medicines that help prevent the growth of fungi (antifungals) are the standard treatment for thrush. These medicines are either applied directly to the affected area (topical) or swallowed (oral). The treatment will depend on the severity of the condition.  Mild Thrush Mild cases of thrush may clear up with the use of an antifungal mouth rinse or lozenges. Treatment usually lasts about 14 days.  Moderate to Severe Thrush  More severe thrush infections that have spread to the esophagus are treated with an oral antifungal medicine. A topical antifungal medicine may also be used.   For some severe infections, a treatment period longer than 14 days may be needed.   Oral antifungal medicines are almost never used during pregnancy because the fetus may be harmed. However, if a pregnant woman has a rare, severe thrush infection that has spread to her blood, oral antifungal medicines may be used. In this case, the risk of harm to the mother and fetus from the severe thrush infection may be greater than the risk posed by the use of antifungal medicines.  Persistent or Recurrent Thrush For cases of thrush that do not go away or keep coming back, treatment may involve the following:   Treatment may be needed twice as long as the symptoms last.   Treatment will   include both oral and topical antifungal medicines.   People with weakened immune systems can take an antifungal medicine on a continuous basis to prevent thrush infections.  It is important to treat conditions that make you more likely to get thrush, such as diabetes or HIV.  HOME CARE INSTRUCTIONS   Only take over-the-counter or prescription medicine as directed by your health care provider. Talk to your health care  provider about an over-the-counter medicine called gentian violet, which kills bacteria and fungi.   Eat plain, unflavored yogurt as directed by your health care provider. Check the label to make sure the yogurt contains live cultures. This yogurt can help healthy bacteria grow in the mouth that can stop the growth of the fungus that causes thrush.   Try these measures to help reduce the discomfort of thrush:   Drink cold liquids such as water or iced tea.   Try flavored ice treats or frozen juices.   Eat foods that are easy to swallow, such as gelatin, ice cream, or custard.   If the patches in your mouth are painful, try drinking from a straw.   Rinse your mouth several times a day with a warm saltwater rinse. You can make the saltwater mixture with 1 tsp (6 g) of salt in 8 fl oz (0.2 L) of warm water.   If you wear dentures, remove the dentures before going to bed, brush them vigorously, and soak them in a cleaning solution as directed by your health care provider.   Women who are breastfeeding should clean their nipples with an antifungal medicine as directed by their health care provider. Dry the nipples after breastfeeding. Applying lanolin-containing body lotion may help relieve nipple soreness.  SEEK MEDICAL CARE IF:  Your symptoms are getting worse or are not improving within 7 days of starting treatment.   You have symptoms of spreading infection, such as white patches on the skin outside of the mouth.   You are nursing and you have redness, burning, or pain in the nipples that is not relieved with treatment.  MAKE SURE YOU:  Understand these instructions.  Will watch your condition.  Will get help right away if you are not doing well or get worse. Document Released: 07/15/2004 Document Revised: 08/10/2013 Document Reviewed: 05/23/2013 Exit[employer] Patient Information 2015 [employer]. This information is not intended to replace advice given to you by your  health care provider. Make sure you discuss any questions you have with your health care provider.  

## 2015-07-20 ENCOUNTER — Other Ambulatory Visit: Payer: Self-pay | Admitting: Family Medicine

## 2015-07-21 ENCOUNTER — Encounter: Payer: Self-pay | Admitting: Family

## 2015-07-21 ENCOUNTER — Ambulatory Visit (INDEPENDENT_AMBULATORY_CARE_PROVIDER_SITE_OTHER): Payer: Medicare Other | Admitting: Family

## 2015-07-21 VITALS — BP 134/69 | HR 78 | Temp 97.0°F | Ht 61.0 in | Wt 176.8 lb

## 2015-07-21 DIAGNOSIS — M545 Low back pain, unspecified: Secondary | ICD-10-CM

## 2015-07-21 DIAGNOSIS — N10 Acute tubulo-interstitial nephritis: Secondary | ICD-10-CM | POA: Diagnosis not present

## 2015-07-21 LAB — POCT UA - MICROSCOPIC ONLY
Casts, Ur, LPF, POC: NEGATIVE
Crystals, Ur, HPF, POC: NEGATIVE
Yeast, UA: NEGATIVE

## 2015-07-21 LAB — POCT URINALYSIS DIPSTICK
Bilirubin, UA: NEGATIVE
Blood, UA: NEGATIVE
Glucose, UA: NEGATIVE
Ketones, UA: NEGATIVE
Nitrite, UA: NEGATIVE
Protein, UA: NEGATIVE
Spec Grav, UA: 1.015
Urobilinogen, UA: NEGATIVE
pH, UA: 7

## 2015-07-21 MED ORDER — CIPROFLOXACIN HCL 500 MG PO TABS: 500.0000 mg | ORAL_TABLET | Freq: Two times a day (BID) | ORAL | Status: DC

## 2015-07-21 MED ORDER — CEFTRIAXONE SODIUM 1 G IJ SOLR
1.0000 g | Freq: Once | INTRAMUSCULAR | Status: AC
  Administered 2015-07-21: 1 g via INTRAMUSCULAR

## 2015-07-21 MED ORDER — TRAMADOL HCL 50 MG PO TABS: 50.0000 mg | ORAL_TABLET | Freq: Three times a day (TID) | ORAL | Status: DC | PRN

## 2015-07-21 NOTE — Progress Notes (Addendum)
   Subjective:    Patient ID: Charlotte Aguirre, female    DOB: 20-Apr-1946, 69 y.o.   MRN: [id number]  Back Pain This is a new problem. The current episode started today. The problem occurs constantly. The problem is unchanged. The pain is present in the lumbar spine. The quality of the pain is described as aching. The pain is at a severity of 9/10. The pain is severe. The symptoms are aggravated by bending and standing. Pertinent negatives include no bladder incontinence, bowel incontinence, dysuria, fever, headaches, leg pain, pelvic pain or tingling. She has tried nothing for the symptoms. The treatment provided no relief.      Review of Systems  Constitutional: Negative.  Negative for fever.  HENT: Negative.   Eyes: Negative.   Respiratory: Negative.  Negative for shortness of breath.   Cardiovascular: Negative.  Negative for palpitations.  Gastrointestinal: Negative.  Negative for bowel incontinence.  Endocrine: Negative.   Genitourinary: Negative.  Negative for bladder incontinence, dysuria and pelvic pain.  Musculoskeletal: Positive for back pain.  Neurological: Negative.  Negative for tingling and headaches.  Hematological: Negative.   Psychiatric/Behavioral: Negative.   All other systems reviewed and are negative.      Objective:   Physical Exam  Constitutional: She is oriented to person, place, and time. She appears well-developed and well-nourished. No distress.  HENT:  Head: Normocephalic and atraumatic.  Eyes: Pupils are equal, round, and reactive to light.  Neck: Normal range of motion. Neck supple. No thyromegaly present.  Cardiovascular: Normal rate, regular rhythm, normal heart sounds and intact distal pulses.   No murmur heard. Pulmonary/Chest: Effort normal and breath sounds normal. No respiratory distress. She has no wheezes.  Abdominal: Soft. Bowel sounds are normal. She exhibits no distension. There is no tenderness.  Musculoskeletal: Normal range of motion. She  exhibits no edema or tenderness.  Right cva tenderness  Neurological: She is alert and oriented to person, place, and time. She has normal reflexes. No cranial nerve deficit.  Skin: Skin is warm and dry.  Psychiatric: She has a normal mood and affect. Her behavior is normal. Judgment and thought content normal.  Vitals reviewed.     BP 134/69 mmHg  Pulse 78  Temp(Src) 97 F (36.1 C) (Oral)  Ht 5\' 1"  (1.549 m)  Wt 176 lb 12.8 oz (80.196 kg)  BMI 33.42 kg/m2     Assessment & Plan:  1. Right-sided low back pain without sciatica - POCT UA - Microscopic Only - POCT urinalysis dipstick - CBC with Differential/Platelet  2. Acute pyelonephritis -Force fluids RTO 2 weeks Culture pending - CBC with Differential/Platelet - cefTRIAXone (ROCEPHIN) injection 1 g; Inject 1 g into the muscle once. - traMADol (ULTRAM) 50 MG tablet; Take 1 tablet (50 mg total) by mouth every 8 (eight) hours as needed.  Dispense: 30 tablet; Refill: 0 - Urine culture - ciprofloxacin (CIPRO) 500 MG tablet; Take 1 tablet (500 mg total) by mouth 2 (two) times daily.  Dispense: 28 tablet; Refill: 0  Evelina Dun, FNP

## 2015-07-21 NOTE — Addendum Note (Signed)
Addended by: Evelina Dun A on: 07/21/2015 09:32 AM   Modules accepted: Orders

## 2015-07-21 NOTE — Patient Instructions (Signed)
Pyelonephritis, Adult °Pyelonephritis is a kidney infection. In general, there are 2 main types of pyelonephritis: °· Infections that come on quickly without any warning (acute pyelonephritis). °· Infections that persist for a long period of time (chronic pyelonephritis). °CAUSES  °Two main causes of pyelonephritis are: °· Bacteria traveling from the bladder to the kidney. This is a problem especially in pregnant women. The urine in the bladder can become filled with bacteria from multiple causes, including: °¨ Inflammation of the prostate gland (prostatitis). °¨ Sexual intercourse in females. °¨ Bladder infection (cystitis). °· Bacteria traveling from the bloodstream to the tissue part of the kidney. °Problems that may increase your risk of getting a kidney infection include: °· Diabetes. °· Kidney stones or bladder stones. °· Cancer. °· Catheters placed in the bladder. °· Other abnormalities of the kidney or ureter. °SYMPTOMS  °· Abdominal pain. °· Pain in the side or flank area. °· Fever. °· Chills. °· Upset stomach. °· Blood in the urine (dark urine). °· Frequent urination. °· Strong or persistent urge to urinate. °· Burning or stinging when urinating. °DIAGNOSIS  °Your caregiver may diagnose your kidney infection based on your symptoms. A urine sample may also be taken. °TREATMENT  °In general, treatment depends on how severe the infection is.  °· If the infection is mild and caught early, your caregiver may treat you with oral antibiotics and send you home. °· If the infection is more severe, the bacteria may have gotten into the bloodstream. This will require intravenous (IV) antibiotics and a hospital stay. Symptoms may include: °¨ High fever. °¨ Severe flank pain. °¨ Shaking chills. °· Even after a hospital stay, your caregiver may require you to be on oral antibiotics for a period of time. °· Other treatments may be required depending upon the cause of the infection. °HOME CARE INSTRUCTIONS  °· Take your  antibiotics as directed. Finish them even if you start to feel better. °· Make an appointment to have your urine checked to make sure the infection is gone. °· Drink enough fluids to keep your urine clear or pale yellow. °· Take medicines for the bladder if you have urgency and frequency of urination as directed by your caregiver. °SEEK IMMEDIATE MEDICAL CARE IF:  °· You have a fever or persistent symptoms for more than 2-3 days. °· You have a fever and your symptoms suddenly get worse. °· You are unable to take your antibiotics or fluids. °· You develop shaking chills. °· You experience extreme weakness or fainting. °· There is no improvement after 2 days of treatment. °MAKE SURE YOU: °· Understand these instructions. °· Will watch your condition. °· Will get help right away if you are not doing well or get worse. °Document Released: 10/20/2005 Document Revised: 04/20/2012 Document Reviewed: 03/26/2011 °[employer]® Patient Information ©2015 [employer]. This information is not intended to replace advice given to you by your health care provider. Make sure you discuss any questions you have with your health care provider. ° °

## 2015-07-22 LAB — CBC WITH DIFFERENTIAL/PLATELET
Basophils Absolute: 0.1 10*3/uL (ref 0.0–0.2)
Basos: 1 %
EOS (ABSOLUTE): 0.2 10*3/uL (ref 0.0–0.4)
Eos: 3 %
Hematocrit: 42.8 % (ref 34.0–46.6)
Hemoglobin: 13.9 g/dL (ref 11.1–15.9)
Immature Grans (Abs): 0 10*3/uL (ref 0.0–0.1)
Immature Granulocytes: 0 %
Lymphocytes Absolute: 3 10*3/uL (ref 0.7–3.1)
Lymphs: 37 %
MCH: 27.3 pg (ref 26.6–33.0)
MCHC: 32.5 g/dL (ref 31.5–35.7)
MCV: 84 fL (ref 79–97)
Monocytes Absolute: 0.5 10*3/uL (ref 0.1–0.9)
Monocytes: 7 %
Neutrophils Absolute: 4.3 10*3/uL (ref 1.4–7.0)
Neutrophils: 52 %
Platelets: 262 10*3/uL (ref 150–379)
RBC: 5.1 x10E6/uL (ref 3.77–5.28)
RDW: 14.1 % (ref 12.3–15.4)
WBC: 8.1 10*3/uL (ref 3.4–10.8)

## 2015-07-23 NOTE — Telephone Encounter (Signed)
Pt needs appt for chronic follow up with lab work

## 2015-07-23 NOTE — Telephone Encounter (Signed)
Last seen 06/19/15  Charlotte Aguirre  Last lipid 10/31/14  Requesting 90 day supply

## 2015-07-24 ENCOUNTER — Encounter (HOSPITAL_COMMUNITY): Payer: Self-pay | Admitting: Emergency Medicine

## 2015-07-24 ENCOUNTER — Emergency Department (HOSPITAL_COMMUNITY)
Admission: EM | Admit: 2015-07-24 | Discharge: 2015-07-24 | Disposition: A | Discharge status: Home / Self Care | Payer: Medicare Other | Attending: Emergency Medicine | Admitting: Emergency Medicine

## 2015-07-24 ENCOUNTER — Emergency Department (HOSPITAL_COMMUNITY): Payer: Medicare Other

## 2015-07-24 DIAGNOSIS — Z87891 Personal history of nicotine dependence: Secondary | ICD-10-CM | POA: Diagnosis not present

## 2015-07-24 DIAGNOSIS — Z79899 Other long term (current) drug therapy: Secondary | ICD-10-CM | POA: Diagnosis not present

## 2015-07-24 DIAGNOSIS — E559 Vitamin D deficiency, unspecified: Secondary | ICD-10-CM | POA: Diagnosis not present

## 2015-07-24 DIAGNOSIS — G629 Polyneuropathy, unspecified: Secondary | ICD-10-CM | POA: Insufficient documentation

## 2015-07-24 DIAGNOSIS — M1990 Unspecified osteoarthritis, unspecified site: Secondary | ICD-10-CM | POA: Diagnosis not present

## 2015-07-24 DIAGNOSIS — Z8781 Personal history of (healed) traumatic fracture: Secondary | ICD-10-CM | POA: Diagnosis not present

## 2015-07-24 DIAGNOSIS — R109 Unspecified abdominal pain: Secondary | ICD-10-CM | POA: Diagnosis not present

## 2015-07-24 DIAGNOSIS — M8580 Other specified disorders of bone density and structure, unspecified site: Secondary | ICD-10-CM | POA: Insufficient documentation

## 2015-07-24 DIAGNOSIS — K219 Gastro-esophageal reflux disease without esophagitis: Secondary | ICD-10-CM | POA: Diagnosis not present

## 2015-07-24 DIAGNOSIS — Z7982 Long term (current) use of aspirin: Secondary | ICD-10-CM | POA: Diagnosis not present

## 2015-07-24 DIAGNOSIS — Z7951 Long term (current) use of inhaled steroids: Secondary | ICD-10-CM | POA: Diagnosis not present

## 2015-07-24 DIAGNOSIS — Z90710 Acquired absence of both cervix and uterus: Secondary | ICD-10-CM | POA: Diagnosis not present

## 2015-07-24 DIAGNOSIS — M797 Fibromyalgia: Secondary | ICD-10-CM | POA: Insufficient documentation

## 2015-07-24 DIAGNOSIS — I10 Essential (primary) hypertension: Secondary | ICD-10-CM | POA: Diagnosis not present

## 2015-07-24 DIAGNOSIS — R208 Other disturbances of skin sensation: Secondary | ICD-10-CM | POA: Insufficient documentation

## 2015-07-24 DIAGNOSIS — M549 Dorsalgia, unspecified: Secondary | ICD-10-CM | POA: Diagnosis present

## 2015-07-24 DIAGNOSIS — E785 Hyperlipidemia, unspecified: Secondary | ICD-10-CM | POA: Insufficient documentation

## 2015-07-24 DIAGNOSIS — R110 Nausea: Secondary | ICD-10-CM | POA: Diagnosis not present

## 2015-07-24 DIAGNOSIS — Z9049 Acquired absence of other specified parts of digestive tract: Secondary | ICD-10-CM | POA: Diagnosis not present

## 2015-07-24 DIAGNOSIS — Z792 Long term (current) use of antibiotics: Secondary | ICD-10-CM | POA: Diagnosis not present

## 2015-07-24 LAB — URINALYSIS, ROUTINE W REFLEX MICROSCOPIC
Bilirubin Urine: NEGATIVE
Glucose, UA: NEGATIVE mg/dL
Hgb urine dipstick: NEGATIVE
Ketones, ur: NEGATIVE mg/dL
Nitrite: NEGATIVE
Protein, ur: NEGATIVE mg/dL
Specific Gravity, Urine: >1.03 — ABNORMAL HIGH (ref 1.005–1.030)
Urobilinogen, UA: 0.2 mg/dL (ref 0.0–1.0)
pH: 6 (ref 5.0–8.0)

## 2015-07-24 LAB — URINE MICROSCOPIC-ADD ON

## 2015-07-24 LAB — URINE CULTURE

## 2015-07-24 MED ORDER — FENTANYL CITRATE (PF) 100 MCG/2ML IJ SOLN
50.0000 ug | Freq: Once | INTRAMUSCULAR | Status: AC
  Administered 2015-07-24: 50 ug via INTRAVENOUS
  Filled 2015-07-24: qty 2

## 2015-07-24 MED ORDER — ONDANSETRON HCL 4 MG/2ML IJ SOLN
4.0000 mg | Freq: Once | INTRAMUSCULAR | Status: AC
Start: 2015-07-24 — End: 2015-07-24
  Administered 2015-07-24: 4 mg via INTRAVENOUS
  Filled 2015-07-24: qty 2

## 2015-07-24 MED ORDER — OXYCODONE-ACETAMINOPHEN 5-325 MG PO TABS
1.0000 | ORAL_TABLET | Freq: Once | ORAL | Status: AC
  Administered 2015-07-24: 1 via ORAL
  Filled 2015-07-24: qty 1

## 2015-07-24 MED ORDER — OXYCODONE-ACETAMINOPHEN 5-325 MG PO TABS: 1.0000 | ORAL_TABLET | Freq: Four times a day (QID) | ORAL | Status: DC | PRN

## 2015-07-24 MED ORDER — ACYCLOVIR 800 MG PO TABS: 800.0000 mg | ORAL_TABLET | Freq: Every day | ORAL | Status: DC

## 2015-07-24 MED ORDER — SODIUM CHLORIDE 0.9 % IV SOLN
INTRAVENOUS | Status: DC
  Administered 2015-07-24: 03:00:00 via INTRAVENOUS

## 2015-07-24 MED ORDER — PREDNISONE 50 MG PO TABS
60.0000 mg | ORAL_TABLET | Freq: Once | ORAL | Status: AC
  Administered 2015-07-24: 60 mg via ORAL
  Filled 2015-07-24 (×2): qty 1

## 2015-07-24 MED ORDER — PREDNISONE 20 MG PO TABS: ORAL_TABLET | ORAL | Status: DC

## 2015-07-24 NOTE — ED Notes (Signed)
Pt c/o lower rt back pain since being diagnosed with kidney infection Saturday.

## 2015-07-24 NOTE — ED Provider Notes (Signed)
CSN: [id number]     Arrival date & time 07/24/15  0238 History   First MD Initiated Contact with Patient 07/24/15 0255     Chief Complaint  Patient presents with  . Back Pain     (Consider location/radiation/quality/duration/timing/severity/associated sxs/prior Treatment) HPI patient states she started having pain in her right back on Friday, September 16. She was seen the following day at her PCP office and diagnosed with pyelonephritis and started on Cipro. She denies dysuria but does have frequency. She denies having cloudy or bloody urine. She denies any fever. She did start get some nausea tonight without vomiting. She states the pain is constant but waxes and wanes and get sharp. She states it is starting to radiate into her right abdomen. She states she is unable to sit still. She states she's also having some burning discomfort of her skin. She states light touch of her skin makes the pain worse, however pressure over the skin does not cause worsening pain. She states her son and her brother have had renal stones. She denies any known injury. She states she's never had this before. She denies any rash. She is unsure if she's ever had chickenpox.   [facility] in [place]  Past Medical History  Diagnosis Date  . GERD (gastroesophageal reflux disease)   . Fibromyalgia   . Peripheral neuropathy   . Arthritis     thumb and fingers  . Hypertension   . Hyperlipidemia   . IBS (irritable bowel syndrome)   . Headache(784.0)   . Ulcer     gastric  . Vitamin D insufficiency   . Memory changes   . Cataract   . Osteopenia   . Wrist fracture 2013   Past Surgical History  Procedure Laterality Date  . Cholecystectomy    . Colonoscopy N/A 03/22/2014    Procedure: COLONOSCOPY;  Surgeon: Rogene Houston, MD;  Location: [facility];  Service: Endoscopy;  Laterality: N/A;  930  . Abdominal hysterectomy      uterus frist and 10 years later both ovaries removed  . Oophorectomy       Family History  Problem Relation Age of Onset  . Dementia Brother   . Heart attack Brother   . Heart failure Mother   . Heart failure Father   . Heart attack Father   . Glaucoma Father   . Heart failure Brother   . Heart attack Brother   . Heart failure Sister   . Heart attack Sister   . Diabetes Sister   . Obesity Sister   . Cancer Brother     LYMPHOMA  . Heart attack Brother   . Cancer Brother     lymphoma  . COPD Sister     from 2nd hand smoke - never a smoker  . Osteoporosis Sister   . Thyroid disease Son    Social History  Substance Use Topics  . Smoking status: Former Smoker    Types: Cigarettes    Quit date: 11/03/1988  . Smokeless tobacco: Never Used  . Alcohol Use: No   Lives at home Lives with spouse  OB History    No data available     Review of Systems  All other systems reviewed and are negative.     Allergies  Aspirin  Home Medications   Prior to Admission medications   Medication Sig Start Date End Date Taking? Authorizing Provider  acyclovir (ZOVIRAX) 800 MG tablet Take 1 tablet (800 mg total)  by mouth 5 (five) times daily. 07/24/15   Rolland Porter, MD  aspirin 81 MG chewable tablet Chew by mouth daily.    Historical Provider, MD  calcium-vitamin D (OSCAL WITH D) 500-200 MG-UNIT per tablet Take 1 tablet by mouth 2 (two) times daily.    Historical Provider, MD  Cholecalciferol (VITAMIN D-3 PO) Take 1 tablet by mouth 2 (two) times daily.     Historical Provider, MD  ciprofloxacin (CIPRO) 500 MG tablet Take 1 tablet (500 mg total) by mouth 2 (two) times daily. 07/21/15   Sharion Balloon, FNP  fluticasone (FLONASE) 50 MCG/ACT nasal spray Place 2 sprays into both nostrils daily. 07/28/14   Lysbeth Penner, FNP  gabapentin (NEURONTIN) 600 MG tablet Take 2 tablets (1,200 mg total) by mouth 2 (two) times daily. 05/16/15   [name], MD  hydrochlorothiazide (HYDRODIURIL) 25 MG tablet Take 1 tablet by mouth  daily 07/23/15   Sharion Balloon, FNP   Multiple Vitamins-Minerals (MULTIVITAMINS THER. W/MINERALS) TABS tablet Take 1 tablet by mouth daily.    Historical Provider, MD  nystatin (MYCOSTATIN) 100000 UNIT/ML suspension Take 5 mLs (500,000 Units total) by mouth 4 (four) times daily. 06/19/15   Sharion Balloon, FNP  Omega-3 Fatty Acids (FISH OIL) 500 MG CAPS Take 2 capsules by mouth 2 (two) times daily.     Historical Provider, MD  oxyCODONE-acetaminophen (PERCOCET/ROXICET) 5-325 MG per tablet Take 1 tablet by mouth every 6 (six) hours as needed for severe pain. 07/24/15   Rolland Porter, MD  pantoprazole (PROTONIX) 40 MG tablet Take 1 tablet by mouth  daily 07/23/15   Sharion Balloon, FNP  predniSONE (DELTASONE) 20 MG tablet Take 3 po QD x 3d , then 2 po QD x 3d then 1 po QD x 3d 07/24/15   Rolland Porter, MD  simvastatin (ZOCOR) 40 MG tablet Take 1 tablet by mouth  daily 07/23/15   Christy A Hawks, FNP   BP 161/78 mmHg  Pulse 60  Temp(Src) 97.8 F (36.6 C) (Oral)  Resp 20  Ht 5\' 2"  (1.575 m)  Wt 174 lb (78.926 kg)  BMI 31.82 kg/m2  SpO2 99%  Vital signs normal except hypertension  Physical Exam  Constitutional: She is oriented to person, place, and time. She appears well-developed and well-nourished.  Non-toxic appearance. She does not appear ill. She appears distressed.  Appears uncomfortable, after my exam she was seen walking around her room pacing  HENT:  Head: Normocephalic and atraumatic.  Right Ear: External ear normal.  Left Ear: External ear normal.  Nose: Nose normal. No mucosal edema or rhinorrhea.  Mouth/Throat: Oropharynx is clear and moist and mucous membranes are normal. No dental abscesses or uvula swelling.  Eyes: Conjunctivae and EOM are normal. Pupils are equal, round, and reactive to light.  Neck: Normal range of motion and full passive range of motion without pain. Neck supple.  Cardiovascular: Normal rate, regular rhythm and normal heart sounds.  Exam reveals no gallop and no friction rub.   No murmur  heard. Pulmonary/Chest: Effort normal and breath sounds normal. No respiratory distress. She has no wheezes. She has no rhonchi. She has no rales. She exhibits no tenderness and no crepitus.  Abdominal: Soft. Normal appearance and bowel sounds are normal. She exhibits no distension. There is no tenderness. There is no rebound and no guarding.    Area of pain noted  Musculoskeletal: Normal range of motion. She exhibits no edema or tenderness.  Back:  Moves all extremities well. Area of pain noted   Neurological: She is alert and oriented to person, place, and time. She has normal strength. No cranial nerve deficit.  Skin: Skin is warm, dry and intact. No rash noted. No erythema. No pallor.  No rash was seen in her back or abdomen  Psychiatric: She has a normal mood and affect. Her speech is normal and behavior is normal. Her mood appears not anxious.  Nursing note and vitals reviewed.   ED Course  Procedures (including critical care time)  Medications  0.9 %  sodium chloride infusion ( Intravenous Stopped 07/24/15 0431)  predniSONE (DELTASONE) tablet 60 mg (not administered)  oxyCODONE-acetaminophen (PERCOCET/ROXICET) 5-325 MG per tablet 1 tablet (not administered)  fentaNYL (SUBLIMAZE) injection 50 mcg (50 mcg Intravenous Given 07/24/15 0319)  ondansetron (ZOFRAN) injection 4 mg (4 mg Intravenous Given 07/24/15 0319)  fentaNYL (SUBLIMAZE) injection 50 mcg (50 mcg Intravenous Given 07/24/15 0429)   Patient was given IV fluids and IV pain and nausea medicine. We discussed her pain could be a kidney stone or from the way she describes the burning of her skin it could be early shingles that has not developed a rash yet.  Patient was rechecked at 4:15 AM. We are still waiting for her CTD resulted. She states her pain was better but it's starting to return.  Patient CT has been resulted and does not show a reason for her pain. We previously discussed of her CT scan was normal this could  be shingles. Patient was given pain medication to take. She was given prednisone and acyclovir to take if she has a rash develops. Her urinary tract infection that was present a few days ago is improving on the Cipro antibiotic. She is advised to finish the antibiotics. Her urine culture has not been resulted yet.   Labs Review  Results for orders placed or performed during the hospital encounter of 07/24/15  Urinalysis, Routine w reflex microscopic  Result Value Ref Range   Color, Urine YELLOW YELLOW   APPearance CLEAR CLEAR   Specific Gravity, Urine >1.030 (H) 1.005 - 1.030   pH 6.0 5.0 - 8.0   Glucose, UA NEGATIVE NEGATIVE mg/dL   Hgb urine dipstick NEGATIVE NEGATIVE   Bilirubin Urine NEGATIVE NEGATIVE   Ketones, ur NEGATIVE NEGATIVE mg/dL   Protein, ur NEGATIVE NEGATIVE mg/dL   Urobilinogen, UA 0.2 0.0 - 1.0 mg/dL   Nitrite NEGATIVE NEGATIVE   Leukocytes, UA TRACE (A) NEGATIVE  Urine microscopic-add on  Result Value Ref Range   Squamous Epithelial / LPF MANY (A) RARE   WBC, UA 0-2 <3 WBC/hpf   RBC / HPF 0-2 <3 RBC/hpf   Bacteria, UA MANY (A) RARE    Laboratory interpretation all normal except improving urinary tract infection   Results for orders placed or performed in visit on 07/21/15  CBC with Differential/Platelet  Result Value Ref Range   WBC 8.1 3.4 - 10.8 x10E3/uL   RBC 5.10 3.77 - 5.28 x10E6/uL   Hemoglobin 13.9 11.1 - 15.9 g/dL   Hematocrit 42.8 34.0 - 46.6 %   MCV 84 79 - 97 fL   MCH 27.3 26.6 - 33.0 pg   MCHC 32.5 31.5 - 35.7 g/dL   RDW 14.1 12.3 - 15.4 %   Platelets 262 150 - 379 x10E3/uL   Neutrophils 52 %   Lymphs 37 %   Monocytes 7 %   Eos 3 %   Basos 1 %   Neutrophils  Absolute 4.3 1.4 - 7.0 x10E3/uL   Lymphocytes Absolute 3.0 0.7 - 3.1 x10E3/uL   Monocytes Absolute 0.5 0.1 - 0.9 x10E3/uL   EOS (ABSOLUTE) 0.2 0.0 - 0.4 x10E3/uL   Basophils Absolute 0.1 0.0 - 0.2 x10E3/uL   Immature Granulocytes 0 %   Immature Grans (Abs) 0.0 0.0 - 0.1 x10E3/uL   POCT UA - Microscopic Only  Result Value Ref Range   WBC, Ur, HPF, POC 20-40    RBC, urine, microscopic occ    Bacteria, U Microscopic many    Mucus, UA few    Epithelial cells, urine per micros occ    Crystals, Ur, HPF, POC negative    Casts, Ur, LPF, POC negative    Yeast, UA negative   POCT urinalysis dipstick  Result Value Ref Range   Color, UA gold    Clarity, UA clear    Glucose, UA negative    Bilirubin, UA negative    Ketones, UA negative    Spec Grav, UA 1.015    Blood, UA negative    pH, UA 7.0    Protein, UA negative    Urobilinogen, UA negative    Nitrite, UA negative    Leukocytes, UA moderate (2+) (A) Negative     Imaging Review Ct Renal Stone Study  07/24/2015   CLINICAL DATA:  RIGHT lower back pain, RIGHT flank pain radiating to RIGHT abdomen, kidney infection diagnosed Saturday, hypertension  EXAM: CT ABDOMEN AND PELVIS WITHOUT CONTRAST  TECHNIQUE: Multidetector CT imaging of the abdomen and pelvis was performed following the standard protocol without IV contrast. Sagittal and coronal MPR images reconstructed from axial data set.  COMPARISON:  11/01/2012  FINDINGS: Lung bases clear.  Gallbladder surgically absent.  Low-attenuation lesion superiorly LEFT lobe liver 20 x 15 mm, unchanged.  Cyst at anterior aspect the RIGHT kidney 2.7 x 2.1 cm image 36.  No urinary tract calcification, hydronephrosis, or ureteral dilatation.  Liver, spleen, pancreas, kidneys, and adrenal glands otherwise normal appearance.  Scattered atherosclerotic calcifications aorta without aneurysm.  Uterus surgically absent with nonvisualization of ovaries and appendix.  Normal appearing bladder and ureters.  Minimal sigmoid diverticulosis without evidence of diverticulitis.  Stomach and bowel loops otherwise normal for technique.  No mass, adenopathy, free air, or free fluid.  Osseous structures unremarkable.  IMPRESSION: No evidence of urinary tract calcification or obstruction.  Slight increase  in size of RIGHT renal cyst.  Stable size of a low-attenuation lesion within LEFT lobe of liver superiorly unchanged since 2013.  Minimal sigmoid diverticulosis.   Electronically Signed   By: Lavonia Dana M.D.   On: 07/24/2015 04:21   I have personally reviewed and evaluated these images and lab results as part of my medical decision-making.   EKG Interpretation None      MDM   Final diagnoses:  Right flank pain   New Prescriptions   ACYCLOVIR (ZOVIRAX) 800 MG TABLET    Take 1 tablet (800 mg total) by mouth 5 (five) times daily.   OXYCODONE-ACETAMINOPHEN (PERCOCET/ROXICET) 5-325 MG PER TABLET    Take 1 tablet by mouth every 6 (six) hours as needed for severe pain.   PREDNISONE (DELTASONE) 20 MG TABLET    Take 3 po QD x 3d , then 2 po QD x 3d then 1 po QD x 3d    Plan discharge  Rolland Porter, MD, Barbette Or, MD 07/24/15 [phone]

## 2015-07-24 NOTE — Discharge Instructions (Signed)
Take the percocet for pain. Watch for a rash. If you see a rash starting on your right back or abdomen, start the acyclovir and prednisone. Finish the antibiotics, it is helping your urinary tract infection.  Recheck if you get a fever, have uncontrolled vomiting or the pain gets worse.

## 2015-07-27 ENCOUNTER — Ambulatory Visit (INDEPENDENT_AMBULATORY_CARE_PROVIDER_SITE_OTHER): Payer: Medicare Other | Admitting: Family Medicine

## 2015-07-27 ENCOUNTER — Ambulatory Visit (INDEPENDENT_AMBULATORY_CARE_PROVIDER_SITE_OTHER): Payer: Medicare Other

## 2015-07-27 ENCOUNTER — Encounter: Payer: Self-pay | Admitting: Family Medicine

## 2015-07-27 VITALS — BP 138/72 | HR 79 | Temp 98.0°F | Ht 61.0 in | Wt 178.6 lb

## 2015-07-27 DIAGNOSIS — N10 Acute tubulo-interstitial nephritis: Secondary | ICD-10-CM | POA: Diagnosis not present

## 2015-07-27 DIAGNOSIS — R300 Dysuria: Secondary | ICD-10-CM

## 2015-07-27 DIAGNOSIS — K5901 Slow transit constipation: Secondary | ICD-10-CM | POA: Diagnosis not present

## 2015-07-27 DIAGNOSIS — R109 Unspecified abdominal pain: Secondary | ICD-10-CM | POA: Diagnosis not present

## 2015-07-27 DIAGNOSIS — G43709 Chronic migraine without aura, not intractable, without status migrainosus: Secondary | ICD-10-CM

## 2015-07-27 LAB — POCT URINALYSIS DIPSTICK
Glucose, UA: NEGATIVE
Ketones, UA: NEGATIVE
Nitrite, UA: NEGATIVE
Spec Grav, UA: 1.025
Urobilinogen, UA: NEGATIVE
pH, UA: 6.5

## 2015-07-27 LAB — POCT UA - MICROSCOPIC ONLY
Bacteria, U Microscopic: NEGATIVE
Casts, Ur, LPF, POC: NEGATIVE
Crystals, Ur, HPF, POC: NEGATIVE
Yeast, UA: NEGATIVE

## 2015-07-27 MED ORDER — POLYETHYLENE GLYCOL 3350 17 GM/SCOOP PO POWD: 17.0000 g | Freq: Two times a day (BID) | ORAL | Status: DC | PRN

## 2015-07-27 MED ORDER — TRAMADOL HCL 50 MG PO TABS: 50.0000 mg | ORAL_TABLET | Freq: Four times a day (QID) | ORAL | Status: DC | PRN

## 2015-07-27 NOTE — Progress Notes (Signed)
Subjective:  Patient ID: Charlotte Aguirre, female    DOB: 1946-09-13  Age: 69 y.o. MRN: [id number]  CC: Abdominal Pain   HPI Raeghan Demeter Matos presents for continued right flank pain. ED did CT on 9/20. It was read as negative for ureteral lithiasis. Started cipro on 9/17 for pyelonephritis. Pain is now an annoying ache with occasional sharp stab. Has had RLQ gurgling. She reports no bowel movement occurring for 3 days. This occurs frequently. She's tried various over-the-counter remedies for this,, Primarily stool softener and fiber. She wonders if she should have some milk of magnesia. She denies any accompanying symptoms such as fever chills sweats, no nausea or vomiting. She denies abdominal discomfort other than the flank pain.  Patient states her headaches are much improved with the gabapentin. The hallucinations of rats climbing the walls etc. described in Dr. Alen Bleacher note from last month have resolved. She says it took a few days after discontinuing the topiramate but hallucinations have not returned.  History Miko has a past medical history of GERD (gastroesophageal reflux disease); Fibromyalgia; Peripheral neuropathy; Arthritis; Hypertension; Hyperlipidemia; IBS (irritable bowel syndrome); Headache(784.0); Ulcer; Vitamin D insufficiency; Memory changes; Cataract; Osteopenia; and Wrist fracture (2013).   She has past surgical history that includes Cholecystectomy; Colonoscopy (N/A, 03/22/2014); Abdominal hysterectomy; and Oophorectomy.   Her family history includes COPD in her sister; Cancer in her brother and brother; Dementia in her brother; Diabetes in her sister; Glaucoma in her father; Heart attack in her brother, brother, brother, father, and sister; Heart failure in her brother, father, mother, and sister; Obesity in her sister; Osteoporosis in her sister; Thyroid disease in her son.She reports that she quit smoking about 26 years ago. Her smoking use included Cigarettes. She has never used  smokeless tobacco. She reports that she does not drink alcohol or use illicit drugs.  Outpatient Prescriptions Prior to Visit  Medication Sig Dispense Refill  . acyclovir (ZOVIRAX) 800 MG tablet Take 1 tablet (800 mg total) by mouth 5 (five) times daily. 35 tablet 0  . aspirin 81 MG chewable tablet Chew by mouth daily.    . calcium-vitamin D (OSCAL WITH D) 500-200 MG-UNIT per tablet Take 1 tablet by mouth 2 (two) times daily.    . Cholecalciferol (VITAMIN D-3 PO) Take 1 tablet by mouth 2 (two) times daily.     . ciprofloxacin (CIPRO) 500 MG tablet Take 1 tablet (500 mg total) by mouth 2 (two) times daily. 28 tablet 0  . fluticasone (FLONASE) 50 MCG/ACT nasal spray Place 2 sprays into both nostrils daily. 16 g 6  . gabapentin (NEURONTIN) 600 MG tablet Take 2 tablets (1,200 mg total) by mouth 2 (two) times daily. 120 tablet 2  . hydrochlorothiazide (HYDRODIURIL) 25 MG tablet Take 1 tablet by mouth  daily 90 tablet 0  . Multiple Vitamins-Minerals (MULTIVITAMINS THER. W/MINERALS) TABS tablet Take 1 tablet by mouth daily.    Marland Kitchen nystatin (MYCOSTATIN) 100000 UNIT/ML suspension Take 5 mLs (500,000 Units total) by mouth 4 (four) times daily. 60 mL 0  . Omega-3 Fatty Acids (FISH OIL) 500 MG CAPS Take 2 capsules by mouth 2 (two) times daily.     . pantoprazole (PROTONIX) 40 MG tablet Take 1 tablet by mouth  daily 90 tablet 0  . simvastatin (ZOCOR) 40 MG tablet Take 1 tablet by mouth  daily 90 tablet 0  . oxyCODONE-acetaminophen (PERCOCET/ROXICET) 5-325 MG per tablet Take 1 tablet by mouth every 6 (six) hours as needed for severe pain. (Patient  not taking: Reported on 07/27/2015) 20 tablet 0  . predniSONE (DELTASONE) 20 MG tablet Take 3 po QD x 3d , then 2 po QD x 3d then 1 po QD x 3d (Patient not taking: Reported on 07/27/2015) 18 tablet 0   No facility-administered medications prior to visit.    ROS Review of Systems  Constitutional: Negative for fever, chills, diaphoresis, appetite change, fatigue and  unexpected weight change.  HENT: Negative for congestion, ear pain, hearing loss, postnasal drip, rhinorrhea, sneezing, sore throat and trouble swallowing.   Eyes: Negative for pain.  Respiratory: Negative for cough, chest tightness and shortness of breath.   Cardiovascular: Negative for chest pain and palpitations.  Gastrointestinal: Positive for constipation. Negative for nausea, vomiting, abdominal pain and diarrhea.  Genitourinary: Positive for flank pain. Negative for dysuria and frequency.  Musculoskeletal: Negative for joint swelling and arthralgias.  Skin: Negative for rash.  Neurological: Negative for dizziness, weakness, numbness and headaches.  Psychiatric/Behavioral: Negative for dysphoric mood and agitation.    Objective:  BP 138/72 mmHg  Pulse 79  Temp(Src) 98 F (36.7 C) (Oral)  Ht 5\' 1"  (1.549 m)  Wt 178 lb 9.6 oz (81.012 kg)  BMI 33.76 kg/m2  BP Readings from Last 3 Encounters:  07/27/15 138/72  07/24/15 127/54  07/21/15 134/69    Wt Readings from Last 3 Encounters:  07/27/15 178 lb 9.6 oz (81.012 kg)  07/24/15 174 lb (78.926 kg)  07/21/15 176 lb 12.8 oz (80.196 kg)     Physical Exam  Constitutional: She is oriented to person, place, and time. She appears well-developed and well-nourished.  HENT:  Head: Normocephalic and atraumatic.  Cardiovascular: Normal rate and regular rhythm.   No murmur heard. Pulmonary/Chest: Effort normal and breath sounds normal.  Abdominal: Soft. Bowel sounds are normal. She exhibits no distension and no mass. There is no tenderness. There is no rebound and no guarding.  Neurological: She is alert and oriented to person, place, and time.  Skin: Skin is warm and dry.  Psychiatric: She has a normal mood and affect. Her behavior is normal.    Lab Results  Component Value Date   HGBA1C  02/16/2009    5.6 (NOTE) The ADA recommends the following therapeutic goal for glycemic control related to Hgb A1c measurement: Goal of  therapy: <6.5 Hgb A1c  Reference: American Diabetes Association: Clinical Practice Recommendations 2010, Diabetes Care, 2010, 33: (Suppl  1).    Lab Results  Component Value Date   WBC 8.1 07/21/2015   HGB 13.5 04/03/2015   HCT 42.8 07/21/2015   PLT 214 10/31/2010   GLUCOSE 95 04/03/2015   Aguirre 156 10/31/2014   TRIG 217* 10/31/2014   HDL 38* 10/31/2014   LDLCALC 91 02/02/2014   ALT 6 04/03/2015   AST 15 04/03/2015   NA 144 04/03/2015   K 3.9 04/03/2015   CL 99 04/03/2015   CREATININE 0.85 04/03/2015   BUN 13 04/03/2015   CO2 30* 04/03/2015   TSH 1.680 10/31/2014   HGBA1C  02/16/2009    5.6 (NOTE) The ADA recommends the following therapeutic goal for glycemic control related to Hgb A1c measurement: Goal of therapy: <6.5 Hgb A1c  Reference: American Diabetes Association: Clinical Practice Recommendations 2010, Diabetes Care, 2010, 33: (Suppl  1).    Ct Renal Stone Study  07/24/2015   CLINICAL DATA:  RIGHT lower back pain, RIGHT flank pain radiating to RIGHT abdomen, kidney infection diagnosed Saturday, hypertension  EXAM: CT ABDOMEN AND PELVIS WITHOUT CONTRAST  TECHNIQUE: Multidetector CT imaging of the abdomen and pelvis was performed following the standard protocol without IV contrast. Sagittal and coronal MPR images reconstructed from axial data set.  COMPARISON:  11/01/2012  FINDINGS: Lung bases clear.  Gallbladder surgically absent.  Low-attenuation lesion superiorly LEFT lobe liver 20 x 15 mm, unchanged.  Cyst at anterior aspect the RIGHT kidney 2.7 x 2.1 cm image 36.  No urinary tract calcification, hydronephrosis, or ureteral dilatation.  Liver, spleen, pancreas, kidneys, and adrenal glands otherwise normal appearance.  Scattered atherosclerotic calcifications aorta without aneurysm.  Uterus surgically absent with nonvisualization of ovaries and appendix.  Normal appearing bladder and ureters.  Minimal sigmoid diverticulosis without evidence of diverticulitis.  Stomach and bowel  loops otherwise normal for technique.  No mass, adenopathy, free air, or free fluid.  Osseous structures unremarkable.  IMPRESSION: No evidence of urinary tract calcification or obstruction.  Slight increase in size of RIGHT renal cyst.  Stable size of a low-attenuation lesion within LEFT lobe of liver superiorly unchanged since 2013.  Minimal sigmoid diverticulosis.   Electronically Signed   By: Lavonia Dana M.D.   On: 07/24/2015 04:21    Assessment & Plan:   Adaly was seen today for abdominal pain.  Diagnoses and all orders for this visit:  Dysuria -     POCT urinalysis dipstick -     POCT UA - Microscopic Only  Flank pain -     DG Abd 2 Views; Future  Slow transit constipation  Acute pyelonephritis  Chronic migraine without aura without status migrainosus, not intractable  Other orders -     polyethylene glycol powder (GLYCOLAX/MIRALAX) powder; Take 17 g by mouth 2 (two) times daily as needed. -     traMADol (ULTRAM) 50 MG tablet; Take 1 tablet (50 mg total) by mouth 4 (four) times daily as needed for moderate pain.   I have discontinued Ms. Molitor's oxyCODONE-acetaminophen and predniSONE. I am also having her start on polyethylene glycol powder and traMADol. Additionally, I am having her maintain her aspirin, Cholecalciferol (VITAMIN D-3 PO), Fish Oil, multivitamins ther. w/minerals, fluticasone, calcium-vitamin D, gabapentin, nystatin, pantoprazole, hydrochlorothiazide, simvastatin, ciprofloxacin, and acyclovir.  Meds ordered this encounter  Medications  . polyethylene glycol powder (GLYCOLAX/MIRALAX) powder    Sig: Take 17 g by mouth 2 (two) times daily as needed.    Dispense:  3350 g    Refill:  10  . traMADol (ULTRAM) 50 MG tablet    Sig: Take 1 tablet (50 mg total) by mouth 4 (four) times daily as needed for moderate pain.    Dispense:  60 tablet    Refill:  02    Flat and upright abdomen preliminary reading reveals somewhat increased gas and stool without  obstruction.   Follow-up: Return in about 3 months (around 10/26/2015), or if symptoms worsen or fail to improve.  [name], M.D.

## 2015-08-01 ENCOUNTER — Encounter: Payer: Self-pay | Admitting: Physician Assistant

## 2015-08-01 ENCOUNTER — Ambulatory Visit (INDEPENDENT_AMBULATORY_CARE_PROVIDER_SITE_OTHER): Payer: Medicare Other | Admitting: Physician Assistant

## 2015-08-01 VITALS — BP 111/64 | HR 72 | Temp 97.4°F | Ht 61.0 in | Wt 174.0 lb

## 2015-08-01 DIAGNOSIS — B029 Zoster without complications: Secondary | ICD-10-CM | POA: Diagnosis not present

## 2015-08-01 DIAGNOSIS — B3731 Acute candidiasis of vulva and vagina: Secondary | ICD-10-CM

## 2015-08-01 DIAGNOSIS — B373 Candidiasis of vulva and vagina: Secondary | ICD-10-CM | POA: Diagnosis not present

## 2015-08-01 MED ORDER — VALACYCLOVIR HCL 1 G PO TABS: 1000.0000 mg | ORAL_TABLET | Freq: Three times a day (TID) | ORAL | Status: DC

## 2015-08-01 MED ORDER — FLUCONAZOLE 150 MG PO TABS: ORAL_TABLET | ORAL | Status: DC

## 2015-08-01 NOTE — Progress Notes (Signed)
   Subjective:    Patient ID: Charlotte Aguirre, female    DOB: Dec 09, 1945, 69 y.o.   MRN: [id number]  HPI 69 y/o female presentw with c/o itching, burning rash on right side.     Review of Systems  Skin: Positive for color change and wound.       Objective:   Physical Exam  Constitutional: She appears well-developed and well-nourished.  Skin: Rash noted. There is erythema.  Psychiatric: She has a normal mood and affect. Her behavior is normal. Judgment and thought content normal.          Assessment & Plan:  1. Herpes zoster  - valACYclovir (VALTREX) 1000 MG tablet; Take 1 tablet (1,000 mg total) by mouth 3 (three) times daily.  Dispense: 21 tablet; Refill: 0  2. Vulvovaginal candidiasis - Stop taking simvistatin x 1 week  - fluconazole (DIFLUCAN) 150 MG tablet; Take 1 tablet PO on day 1. Repeat in 3 days  Dispense: 2 tablet; Refill: 0   RTC 1-2 weeks for recheck of shingle.s   Kendalynn Wideman A. Benjamin Stain PA-C

## 2015-08-01 NOTE — Patient Instructions (Signed)

## 2015-08-03 ENCOUNTER — Other Ambulatory Visit: Payer: Self-pay | Admitting: Family Medicine

## 2015-08-16 ENCOUNTER — Ambulatory Visit: Payer: Medicare Other | Admitting: Family

## 2015-08-21 ENCOUNTER — Ambulatory Visit (INDEPENDENT_AMBULATORY_CARE_PROVIDER_SITE_OTHER): Payer: Medicare Other

## 2015-08-21 DIAGNOSIS — Z23 Encounter for immunization: Secondary | ICD-10-CM | POA: Diagnosis not present

## 2015-08-27 ENCOUNTER — Encounter (INDEPENDENT_AMBULATORY_CARE_PROVIDER_SITE_OTHER): Payer: Self-pay | Admitting: Internal Medicine

## 2015-08-27 ENCOUNTER — Ambulatory Visit (INDEPENDENT_AMBULATORY_CARE_PROVIDER_SITE_OTHER): Payer: Medicare Other | Admitting: Internal Medicine

## 2015-08-27 ENCOUNTER — Encounter (INDEPENDENT_AMBULATORY_CARE_PROVIDER_SITE_OTHER): Payer: Self-pay | Admitting: *Deleted

## 2015-08-27 VITALS — BP 132/62 | HR 60 | Temp 98.2°F | Ht 62.0 in | Wt 176.8 lb

## 2015-08-27 DIAGNOSIS — K5909 Other constipation: Secondary | ICD-10-CM | POA: Diagnosis not present

## 2015-08-27 DIAGNOSIS — R1310 Dysphagia, unspecified: Secondary | ICD-10-CM

## 2015-08-27 DIAGNOSIS — R1031 Right lower quadrant pain: Secondary | ICD-10-CM | POA: Diagnosis not present

## 2015-08-27 NOTE — Patient Instructions (Addendum)
DG esophagram. Further recommendations to follow CT abdomen/pelvis with CM.  Samples of Amitiza

## 2015-08-27 NOTE — Progress Notes (Signed)
Subjective:    Patient ID: Charlotte Aguirre, female    DOB: 1946/04/10, 69 y.o.   MRN: [id number]  HPI   69 yr old female with c/o constipation. She says she has a hx of IBS.  She was treated for a kidney infection 2 months ago with Cipro. She tells me she became impacted. She took [place] MOM which helped. She tells me she stays constipated everyday.  She takes Fiber tabs x 3 a day and a stool softener. Constipation  X 2 1/2 -3 months. She has tried Probiotics which did not help.  Appetite is good. No weight loss. She says she has problems swallowing large pills.  She says she cannot eat peanut butter, sweet potatoes, and cornbread. EGD/ED years ago.  She usually has a BM now every 3-5 days recently. She was having a BM daily. No GERD. Controlled with Protonix 03/23/2015 Colonoscopy  Indications: Patient is 69 year old Caucasian female was undergoing average risk screening colonoscopy. Her last exam was about 14 years ago. Small polyp ablated via cold biopsy from splenic flexure. Mild sigmoid colon diverticulosis.  Incidental finding of small rectal AV malformation.   Biopsy results reviewed with patient. Patient had small polyp removed his tubular adenoma Next colonoscopy in 10 years.   Review of Systems     Past Medical History  Diagnosis Date  . GERD (gastroesophageal reflux disease)   . Fibromyalgia   . Peripheral neuropathy ([place])   . Arthritis     thumb and fingers  . Hypertension   . Hyperlipidemia   . IBS (irritable bowel syndrome)   . Headache(784.0)   . Ulcer     gastric  . Vitamin D insufficiency   . Memory changes   . Cataract   . Osteopenia   . Wrist fracture 2013    Past Surgical History  Procedure Laterality Date  . Cholecystectomy    . Colonoscopy N/A 03/22/2014    Procedure: COLONOSCOPY;  Surgeon: Rogene Houston, MD;  Location: [facility];  Service: Endoscopy;  Laterality: N/A;  930  . Abdominal hysterectomy      uterus frist and 10 years later  both ovaries removed  . Oophorectomy      Allergies  Allergen Reactions  . Aspirin     Must take EC, prior history of stomach ulcer.  . Topiramate Other (See Comments)    hallucinations    Current Outpatient Prescriptions on File Prior to Visit  Medication Sig Dispense Refill  . aspirin 81 MG chewable tablet Chew by mouth daily.    . calcium-vitamin D (OSCAL WITH D) 500-200 MG-UNIT per tablet Take 1 tablet by mouth 2 (two) times daily.    . Cholecalciferol (VITAMIN D-3 PO) Take 1 tablet by mouth 2 (two) times daily. 1000mg     . fluticasone (FLONASE) 50 MCG/ACT nasal spray Place 2 sprays into both nostrils daily. 16 g 6  . gabapentin (NEURONTIN) 600 MG tablet Take 2 tablets by mouth two times daily 360 tablet 0  . hydrochlorothiazide (HYDRODIURIL) 25 MG tablet Take 1 tablet by mouth  daily 90 tablet 0  . Multiple Vitamins-Minerals (MULTIVITAMINS THER. W/MINERALS) TABS tablet Take 1 tablet by mouth daily.    . Omega-3 Fatty Acids (FISH OIL) 500 MG CAPS Take 2 capsules by mouth 2 (two) times daily.     . pantoprazole (PROTONIX) 40 MG tablet Take 1 tablet by mouth  daily 90 tablet 0  . polyethylene glycol powder (GLYCOLAX/MIRALAX) powder Take 17 g by mouth  2 (two) times daily as needed. 3350 g 10  . simvastatin (ZOCOR) 40 MG tablet Take 1 tablet by mouth  daily 90 tablet 0   No current facility-administered medications on file prior to visit.     Objective:   Physical Exam Blood pressure 132/62, pulse 60, temperature 98.2 F (36.8 C), height 5\' 2"  (1.575 m), weight 176 lb 12.8 oz (80.196 kg). Alert and oriented. Skin warm and dry. Oral mucosa is moist.   . Sclera anicteric, conjunctivae is pink. Thyroid not enlarged. No cervical lymphadenopathy. Lungs clear. Heart regular rate and rhythm.  Abdomen is soft. Bowel sounds are positive. No hepatomegaly. No abdominal masses felt. Slight tenderness rt lower quadrant.  No edema to lower extremities.         Assessment & Plan:    Constipation/ rt lower quadrant tenderness.. Seems it may be medication induced. Am going to try her on Amitiza 75mcg and she how she does,. Dysphagia: Am going to get a DG esophagram and further recommendations to follow.

## 2015-08-29 ENCOUNTER — Inpatient Hospital Stay (HOSPITAL_COMMUNITY): Admission: RE | Admit: 2015-08-29 | Payer: Medicare Other | Source: Ambulatory Visit

## 2015-08-31 ENCOUNTER — Ambulatory Visit (HOSPITAL_COMMUNITY)
Admission: RE | Admit: 2015-08-31 | Discharge: 2015-08-31 | Disposition: A | Discharge status: Home / Self Care | Payer: Medicare Other | Source: Ambulatory Visit | Attending: Internal Medicine | Admitting: Internal Medicine

## 2015-08-31 DIAGNOSIS — K5730 Diverticulosis of large intestine without perforation or abscess without bleeding: Secondary | ICD-10-CM | POA: Diagnosis not present

## 2015-08-31 DIAGNOSIS — K5909 Other constipation: Secondary | ICD-10-CM

## 2015-08-31 DIAGNOSIS — R1031 Right lower quadrant pain: Secondary | ICD-10-CM | POA: Diagnosis not present

## 2015-08-31 DIAGNOSIS — K5900 Constipation, unspecified: Secondary | ICD-10-CM | POA: Insufficient documentation

## 2015-08-31 DIAGNOSIS — K7689 Other specified diseases of liver: Secondary | ICD-10-CM | POA: Insufficient documentation

## 2015-08-31 DIAGNOSIS — Z90710 Acquired absence of both cervix and uterus: Secondary | ICD-10-CM | POA: Insufficient documentation

## 2015-08-31 LAB — POCT I-STAT CREATININE: Creatinine, Ser: 0.9 mg/dL (ref 0.44–1.00)

## 2015-08-31 MED ORDER — IOHEXOL 300 MG/ML  SOLN
100.0000 mL | Freq: Once | INTRAMUSCULAR | Status: AC | PRN
  Administered 2015-08-31: 100 mL via INTRAVENOUS

## 2015-09-06 ENCOUNTER — Ambulatory Visit (HOSPITAL_COMMUNITY)
Admission: RE | Admit: 2015-09-06 | Discharge: 2015-09-06 | Disposition: A | Discharge status: Home / Self Care | Payer: Medicare Other | Source: Ambulatory Visit | Attending: Internal Medicine | Admitting: Internal Medicine

## 2015-09-06 DIAGNOSIS — R1310 Dysphagia, unspecified: Secondary | ICD-10-CM | POA: Insufficient documentation

## 2015-09-10 ENCOUNTER — Other Ambulatory Visit: Payer: Self-pay | Admitting: Family

## 2015-09-13 ENCOUNTER — Ambulatory Visit (INDEPENDENT_AMBULATORY_CARE_PROVIDER_SITE_OTHER): Payer: Medicare Other | Admitting: Family

## 2015-09-13 ENCOUNTER — Encounter: Payer: Self-pay | Admitting: Family

## 2015-09-13 VITALS — BP 125/59 | HR 72 | Temp 97.1°F | Ht 62.0 in | Wt 178.6 lb

## 2015-09-13 DIAGNOSIS — B373 Candidiasis of vulva and vagina: Secondary | ICD-10-CM

## 2015-09-13 DIAGNOSIS — N898 Other specified noninflammatory disorders of vagina: Secondary | ICD-10-CM

## 2015-09-13 DIAGNOSIS — R102 Pelvic and perineal pain: Secondary | ICD-10-CM

## 2015-09-13 DIAGNOSIS — B3731 Acute candidiasis of vulva and vagina: Secondary | ICD-10-CM

## 2015-09-13 LAB — POCT WET PREP WITH KOH: Trichomonas, UA: NEGATIVE

## 2015-09-13 MED ORDER — FLUCONAZOLE 150 MG PO TABS: 150.0000 mg | ORAL_TABLET | ORAL | Status: DC

## 2015-09-13 MED ORDER — FLUCONAZOLE 150 MG PO TABS: 150.0000 mg | ORAL_TABLET | Freq: Once | ORAL | Status: DC

## 2015-09-13 MED ORDER — CLOTRIMAZOLE 1 % EX CREA: 1.0000 "application " | TOPICAL_CREAM | Freq: Two times a day (BID) | CUTANEOUS | Status: DC

## 2015-09-13 NOTE — Progress Notes (Signed)
   Subjective:    Patient ID: Charlotte Aguirre, female    DOB: 03/25/1946, 69 y.o.   MRN: [id number]  HPI Pt presents to the office today for right vaginal pain. Pt states she has a rectocele and states she felt yesterday and feels like it was bulging. Pt states now her right side inside of the vaginal wall seems to be "raw". Pt states she has used permarin cream in the past, but caused her to itch and for the skin to become red. PT denies any history or family history of breast, cervical, endometrial cancer.    Review of Systems  Constitutional: Negative.   HENT: Negative.   Eyes: Negative.   Respiratory: Negative.  Negative for shortness of breath.   Cardiovascular: Negative.  Negative for palpitations.  Gastrointestinal: Negative.   Endocrine: Negative.   Genitourinary: Negative.   Musculoskeletal: Negative.   Neurological: Negative.  Negative for headaches.  Hematological: Negative.   Psychiatric/Behavioral: Negative.   All other systems reviewed and are negative.      Objective:   Physical Exam  Constitutional: She is oriented to person, place, and time. She appears well-developed and well-nourished. No distress.  HENT:  Head: Normocephalic and atraumatic.  Eyes: Pupils are equal, round, and reactive to light.  Neck: Normal range of motion. Neck supple. No thyromegaly present.  Cardiovascular: Normal rate, regular rhythm, normal heart sounds and intact distal pulses.   No murmur heard. Pulmonary/Chest: Effort normal and breath sounds normal. No respiratory distress. She has no wheezes.  Abdominal: Soft. Bowel sounds are normal. She exhibits no distension. There is no tenderness.  Genitourinary: There is rash and tenderness on the right labia. There is tenderness on the left labia.  Musculoskeletal: Normal range of motion. She exhibits no edema or tenderness.  Neurological: She is alert and oriented to person, place, and time. She has normal reflexes. No cranial nerve deficit.    Skin: Skin is warm and dry.  Psychiatric: She has a normal mood and affect. Her behavior is normal. Judgment and thought content normal.  Vitals reviewed.   BP 125/59 mmHg  Pulse 72  Temp(Src) 97.1 F (36.2 C) (Oral)  Ht 5\' 2"  (1.575 m)  Wt 178 lb 9.6 oz (81.012 kg)  BMI 32.66 kg/m2       Assessment & Plan:  1. Vaginal pain - POCT Wet Prep with KOH  2. Vaginal irritation - POCT Wet Prep with KOH  3. Vagina, candidiasis -Keep clean and dry -Cotton underwear -Do not scratch -RTO prn  - fluconazole (DIFLUCAN) 150 MG tablet; Take 1 tablet (150 mg total) by mouth once.  Dispense: 1 tablet; Refill: 0 - clotrimazole (LOTRIMIN) 1 % cream; Apply 1 application topically 2 (two) times daily.  Dispense: 30 g; Refill: 0  Evelina Dun, FNP

## 2015-09-13 NOTE — Addendum Note (Signed)
Addended by: Evelina Dun A on: 09/13/2015 12:06 PM   Modules accepted: Orders

## 2015-09-13 NOTE — Patient Instructions (Signed)
vaMonilial Vaginitis Vaginitis in a soreness, swelling and redness (inflammation) of the vagina and vulva. Monilial vaginitis is not a sexually transmitted infection. CAUSES  Yeast vaginitis is caused by yeast (candida) that is normally found in your vagina. With a yeast infection, the candida has overgrown in number to a point that upsets the chemical balance. SYMPTOMS   White, thick vaginal discharge.  Swelling, itching, redness and irritation of the vagina and possibly the lips of the vagina (vulva).  Burning or painful urination.  Painful intercourse. DIAGNOSIS  Things that may contribute to monilial vaginitis are:  Postmenopausal and virginal states.  Pregnancy.  Infections.  Being tired, sick or stressed, especially if you had monilial vaginitis in the past.  Diabetes. Good control will help lower the chance.  Birth control pills.  Tight fitting garments.  Using bubble bath, feminine sprays, douches or deodorant tampons.  Taking certain medications that kill germs (antibiotics).  Sporadic recurrence can occur if you become ill. TREATMENT  Your caregiver will give you medication.  There are several kinds of anti monilial vaginal creams and suppositories specific for monilial vaginitis. For recurrent yeast infections, use a suppository or cream in the vagina 2 times a week, or as directed.  Anti-monilial or steroid cream for the itching or irritation of the vulva may also be used. Get your caregiver's permission.  Painting the vagina with methylene blue solution may help if the monilial cream does not work.  Eating yogurt may help prevent monilial vaginitis. HOME CARE INSTRUCTIONS   Finish all medication as prescribed.  Do not have sex until treatment is completed or after your caregiver tells you it is okay.  Take warm sitz baths.  Do not douche.  Do not use tampons, especially scented ones.  Wear cotton underwear.  Avoid tight pants and panty  hose.  Tell your sexual partner that you have a yeast infection. They should go to their caregiver if they have symptoms such as mild rash or itching.  Your sexual partner should be treated as well if your infection is difficult to eliminate.  Practice safer sex. Use condoms.  Some vaginal medications cause latex condoms to fail. Vaginal medications that harm condoms are:  Cleocin cream.  Butoconazole (Femstat).  Terconazole (Terazol) vaginal suppository.  Miconazole (Monistat) (may be purchased over the counter). SEEK MEDICAL CARE IF:   You have a temperature by mouth above 102 F (38.9 C).  The infection is getting worse after 2 days of treatment.  The infection is not getting better after 3 days of treatment.  You develop blisters in or around your vagina.  You develop vaginal bleeding, and it is not your menstrual period.  You have pain when you urinate.  You develop intestinal problems.  You have pain with sexual intercourse.   This information is not intended to replace advice given to you by your health care provider. Make sure you discuss any questions you have with your health care provider.   Document Released: 07/30/2005 Document Revised: 01/12/2012 Document Reviewed: 04/23/2015 Elsevier Interactive Patient Education [employer].

## 2015-09-15 ENCOUNTER — Other Ambulatory Visit: Payer: Self-pay | Admitting: Family

## 2015-09-17 ENCOUNTER — Telehealth: Payer: Self-pay | Admitting: Family

## 2015-09-17 NOTE — Telephone Encounter (Signed)
Patient will call [facility],  Dr. Corinna Capra in [place].

## 2015-09-18 DIAGNOSIS — R102 Pelvic and perineal pain: Secondary | ICD-10-CM | POA: Diagnosis not present

## 2015-09-18 DIAGNOSIS — N816 Rectocele: Secondary | ICD-10-CM | POA: Diagnosis not present

## 2015-09-19 ENCOUNTER — Other Ambulatory Visit: Payer: Self-pay

## 2015-09-19 MED ORDER — GABAPENTIN 600 MG PO TABS: ORAL_TABLET | ORAL | Status: DC

## 2015-09-25 ENCOUNTER — Ambulatory Visit: Admit: 2015-09-25 | Payer: Medicare Other | Admitting: Obstetrics and Gynecology

## 2015-09-25 SURGERY — COLPORRHAPHY, POSTERIOR, FOR RECTOCELE REPAIR
Anesthesia: Choice

## 2015-09-26 ENCOUNTER — Telehealth: Payer: Self-pay | Admitting: *Deleted

## 2015-09-26 NOTE — Telephone Encounter (Signed)
Called patient to inquire if [employer] had contacted her today?  Patient stated they have not but then said someone had called her and gave me the number which is the [place] number.  Informed patient the test is not covered under her Medicare Part A and the cost would be $3900.  Patient states she signed up today for Medicaid and has to return one piece of paper to them on Monday. Patient states she wants to proceed with testing.

## 2015-10-08 DIAGNOSIS — N816 Rectocele: Secondary | ICD-10-CM | POA: Diagnosis not present

## 2015-10-10 ENCOUNTER — Encounter: Payer: Self-pay | Admitting: Cardiology

## 2015-10-10 DIAGNOSIS — R102 Pelvic and perineal pain: Secondary | ICD-10-CM | POA: Diagnosis not present

## 2015-10-10 DIAGNOSIS — N816 Rectocele: Secondary | ICD-10-CM | POA: Diagnosis not present

## 2015-10-12 ENCOUNTER — Telehealth: Payer: Self-pay | Admitting: Family

## 2015-10-12 NOTE — Telephone Encounter (Signed)
Patient of Charlotte Aguirre. Please advise and route to [facility] A

## 2015-10-12 NOTE — Telephone Encounter (Signed)
She needs to come in and be seen and have labs done, because I cannot see those labs as they are not in our system. Caryl Pina, MD [facility] 10/12/2015, 10:13 AM

## 2015-10-16 ENCOUNTER — Ambulatory Visit (INDEPENDENT_AMBULATORY_CARE_PROVIDER_SITE_OTHER): Payer: Medicare Other | Admitting: Family

## 2015-10-16 ENCOUNTER — Encounter: Payer: Self-pay | Admitting: Family

## 2015-10-16 VITALS — BP 115/68 | HR 76 | Temp 97.9°F | Ht 62.0 in | Wt 175.0 lb

## 2015-10-16 DIAGNOSIS — J309 Allergic rhinitis, unspecified: Secondary | ICD-10-CM | POA: Diagnosis not present

## 2015-10-16 DIAGNOSIS — E785 Hyperlipidemia, unspecified: Secondary | ICD-10-CM

## 2015-10-16 DIAGNOSIS — R7303 Prediabetes: Secondary | ICD-10-CM

## 2015-10-16 DIAGNOSIS — M8580 Other specified disorders of bone density and structure, unspecified site: Secondary | ICD-10-CM

## 2015-10-16 DIAGNOSIS — E559 Vitamin D deficiency, unspecified: Secondary | ICD-10-CM | POA: Diagnosis not present

## 2015-10-16 DIAGNOSIS — M1990 Unspecified osteoarthritis, unspecified site: Secondary | ICD-10-CM | POA: Diagnosis not present

## 2015-10-16 DIAGNOSIS — G609 Hereditary and idiopathic neuropathy, unspecified: Secondary | ICD-10-CM | POA: Diagnosis not present

## 2015-10-16 DIAGNOSIS — Z8711 Personal history of peptic ulcer disease: Secondary | ICD-10-CM | POA: Diagnosis not present

## 2015-10-16 DIAGNOSIS — K219 Gastro-esophageal reflux disease without esophagitis: Secondary | ICD-10-CM

## 2015-10-16 DIAGNOSIS — I10 Essential (primary) hypertension: Secondary | ICD-10-CM | POA: Diagnosis not present

## 2015-10-16 NOTE — Progress Notes (Signed)
Subjective:    Patient ID: Charlotte Aguirre, female    DOB: [birth year], 69 y.o.   MRN: [id number]  Pt presents to the office today for chronic follow up. Pt had her rectocele repaired on 10/10/15. Pt states she is doing well.  Hypertension This is a chronic problem. The current episode started more than 1 year ago. The problem has been resolved since onset. The problem is controlled. Associated symptoms include blurred vision. Pertinent negatives include no anxiety, headaches, malaise/fatigue, palpitations, peripheral edema or shortness of breath. Risk factors for coronary artery disease include dyslipidemia, obesity, post-menopausal state and sedentary lifestyle. Past treatments include diuretics. The current treatment provides significant improvement. There is no history of kidney disease, CAD/MI, CVA, heart failure or a thyroid problem. There is no history of sleep apnea.  Hyperlipidemia This is a chronic problem. The current episode started more than 1 year ago. The problem is controlled. Recent lipid tests were reviewed and are normal. She has no history of diabetes. Pertinent negatives include no leg pain or shortness of breath. Current antihyperlipidemic treatment includes statins. The current treatment provides moderate improvement of lipids. Risk factors for coronary artery disease include dyslipidemia, family history, hypertension, obesity and post-menopausal.  Gastroesophageal Reflux She reports no belching, no coughing, no heartburn or no sore throat. The current episode started more than 1 year ago. The problem occurs rarely. The problem has been resolved. Risk factors include obesity. She has tried a PPI for the symptoms. The treatment provided moderate relief.      Review of Systems  Constitutional: Negative.  Negative for malaise/fatigue.  HENT: Negative.  Negative for sore throat.   Eyes: Positive for blurred vision.  Respiratory: Negative.  Negative for cough and shortness of  breath.   Cardiovascular: Negative.  Negative for palpitations.  Gastrointestinal: Negative.  Negative for heartburn.  Endocrine: Negative.   Genitourinary: Negative.   Musculoskeletal: Negative.   Neurological: Negative.  Negative for headaches.  Hematological: Negative.   Psychiatric/Behavioral: Negative.   All other systems reviewed and are negative.      Objective:   Physical Exam  Constitutional: She is oriented to person, place, and time. She appears well-developed and well-nourished. No distress.  HENT:  Head: Normocephalic and atraumatic.  Right Ear: External ear normal.  Left Ear: External ear normal.  Nose: Nose normal.  Mouth/Throat: Oropharynx is clear and moist.  Eyes: Pupils are equal, round, and reactive to light.  Neck: Normal range of motion. Neck supple. No thyromegaly present.  Cardiovascular: Normal rate, regular rhythm, normal heart sounds and intact distal pulses.   No murmur heard. Pulmonary/Chest: Effort normal and breath sounds normal. No respiratory distress. She has no wheezes.  Abdominal: Soft. Bowel sounds are normal. She exhibits no distension. There is no tenderness.  Musculoskeletal: Normal range of motion. She exhibits no edema or tenderness.  Neurological: She is alert and oriented to person, place, and time. She has normal reflexes. No cranial nerve deficit.  Skin: Skin is warm and dry.  Psychiatric: She has a normal mood and affect. Her behavior is normal. Judgment and thought content normal.  Vitals reviewed.     BP 115/68 mmHg  Pulse 76  Temp(Src) 97.9 F (36.6 C) (Oral)  Ht _0  (1.575 m)  Wt 175 lb (79.379 kg)  BMI 32.00 kg/m2     Assessment & Plan:  1. Essential hypertension - CMP14+EGFR  2. Allergic rhinitis, unspecified allergic rhinitis type - CMP14+EGFR  3. Gastroesophageal reflux disease without esophagitis -  CMP14+EGFR  4. Hereditary and idiopathic peripheral neuropathy - CMP14+EGFR  5. Arthritis -  CMP14+EGFR  6. Osteoarthritis, unspecified osteoarthritis type, unspecified site - CMP14+EGFR  7. Osteopenia - CMP14+EGFR  8. Vitamin D deficiency - CMP14+EGFR - VITAMIN D 25 Hydroxy (Vit-D Deficiency, Fractures)  9. Hyperlipidemia with target LDL less than 100 - CMP14+EGFR - Lipid panel  10. PEPTIC ULCER DISEASE, HX OF - CMP14+EGFR  11. Prediabetes - CMP14+EGFR   Continue all meds Labs pending Health Maintenance reviewed Diet and exercise encouraged RTO 6 months  Evelina Dun, FNP

## 2015-10-16 NOTE — Patient Instructions (Signed)
Health Maintenance, Female Adopting a healthy lifestyle and getting preventive care can go a long way to promote health and wellness. Talk with your health care provider about what schedule of regular examinations is right for you. This is a good chance for you to check in with your provider about disease prevention and staying healthy. In between checkups, there are plenty of things you can do on your own. Experts have done a lot of research about which lifestyle changes and preventive measures are most likely to keep you healthy. Ask your health care provider for more information. WEIGHT AND DIET  Eat a healthy diet  Be sure to include plenty of vegetables, fruits, low-fat dairy products, and lean protein.  Do not eat a lot of foods high in solid fats, added sugars, or salt.  Get regular exercise. This is one of the most important things you can do for your health.  Most adults should exercise for at least 150 minutes each week. The exercise should increase your heart rate and make you sweat (moderate-intensity exercise).  Most adults should also do strengthening exercises at least twice a week. This is in addition to the moderate-intensity exercise.  Maintain a healthy weight  Body mass index (BMI) is a measurement that can be used to identify possible weight problems. It estimates body fat based on height and weight. Your health care provider can help determine your BMI and help you achieve or maintain a healthy weight.  For females 20 years of age and older:   A BMI below 18.5 is considered underweight.  A BMI of 18.5 to 24.9 is normal.  A BMI of 25 to 29.9 is considered overweight.  A BMI of 30 and above is considered obese.  Watch levels of cholesterol and blood lipids  You should start having your blood tested for lipids and cholesterol at 71 years of age, then have this test every 5 years.  You may need to have your cholesterol levels checked more often if:  Your lipid  or cholesterol levels are high.  You are older than 71 years of age.  You are at high risk for heart disease.  CANCER SCREENING   Lung Cancer  Lung cancer screening is recommended for adults 55-80 years old who are at high risk for lung cancer because of a history of smoking.  A yearly low-dose CT scan of the lungs is recommended for people who:  Currently smoke.  Have quit within the past 15 years.  Have at least a 30-pack-year history of smoking. A pack year is smoking an average of one pack of cigarettes a day for 1 year.  Yearly screening should continue until it has been 15 years since you quit.  Yearly screening should stop if you develop a health problem that would prevent you from having lung cancer treatment.  Breast Cancer  Practice breast self-awareness. This means understanding how your breasts normally appear and feel.  It also means doing regular breast self-exams. Let your health care provider know about any changes, no matter how small.  If you are in your 20s or 30s, you should have a clinical breast exam (CBE) by a health care provider every 1-3 years as part of a regular health exam.  If you are 40 or older, have a CBE every year. Also consider having a breast X-ray (mammogram) every year.  If you have a family history of breast cancer, talk to your health care provider about genetic screening.  If you   are at high risk for breast cancer, talk to your health care provider about having an MRI and a mammogram every year.  Breast cancer gene (BRCA) assessment is recommended for women who have family members with BRCA-related cancers. BRCA-related cancers include:  Breast.  Ovarian.  Tubal.  Peritoneal cancers.  Results of the assessment will determine the need for genetic counseling and BRCA1 and BRCA2 testing. Cervical Cancer Your health care provider may recommend that you be screened regularly for cancer of the pelvic organs (ovaries, uterus, and  vagina). This screening involves a pelvic examination, including checking for microscopic changes to the surface of your cervix (Pap test). You may be encouraged to have this screening done every 3 years, beginning at age 21.  For women ages 30-65, health care providers may recommend pelvic exams and Pap testing every 3 years, or they may recommend the Pap and pelvic exam, combined with testing for human papilloma virus (HPV), every 5 years. Some types of HPV increase your risk of cervical cancer. Testing for HPV may also be done on women of any age with unclear Pap test results.  Other health care providers may not recommend any screening for nonpregnant women who are considered low risk for pelvic cancer and who do not have symptoms. Ask your health care provider if a screening pelvic exam is right for you.  If you have had past treatment for cervical cancer or a condition that could lead to cancer, you need Pap tests and screening for cancer for at least 20 years after your treatment. If Pap tests have been discontinued, your risk factors (such as having a new sexual partner) need to be reassessed to determine if screening should resume. Some women have medical problems that increase the chance of getting cervical cancer. In these cases, your health care provider may recommend more frequent screening and Pap tests. Colorectal Cancer  This type of cancer can be detected and often prevented.  Routine colorectal cancer screening usually begins at 71 years of age and continues through 71 years of age.  Your health care provider may recommend screening at an earlier age if you have risk factors for colon cancer.  Your health care provider may also recommend using home test kits to check for hidden blood in the stool.  A small camera at the end of a tube can be used to examine your colon directly (sigmoidoscopy or colonoscopy). This is done to check for the earliest forms of colorectal  cancer.  Routine screening usually begins at age 50.  Direct examination of the colon should be repeated every 5-10 years through 71 years of age. However, you may need to be screened more often if early forms of precancerous polyps or small growths are found. Skin Cancer  Check your skin from head to toe regularly.  Tell your health care provider about any new moles or changes in moles, especially if there is a change in a mole's shape or color.  Also tell your health care provider if you have a mole that is larger than the size of a pencil eraser.  Always use sunscreen. Apply sunscreen liberally and repeatedly throughout the day.  Protect yourself by wearing long sleeves, pants, a wide-brimmed hat, and sunglasses whenever you are outside. HEART DISEASE, DIABETES, AND HIGH BLOOD PRESSURE   High blood pressure causes heart disease and increases the risk of stroke. High blood pressure is more likely to develop in:  People who have blood pressure in the high end   of the normal range (130-139/85-89 mm Hg).  People who are overweight or obese.  People who are African American.  If you are 38-23 years of age, have your blood pressure checked every 3-5 years. If you are 61 years of age or older, have your blood pressure checked every year. You should have your blood pressure measured twice--once when you are at a hospital or clinic, and once when you are not at a hospital or clinic. Record the average of the two measurements. To check your blood pressure when you are not at a hospital or clinic, you can use:  An automated blood pressure machine at a pharmacy.  A home blood pressure monitor.  If you are between 45 years and 39 years old, ask your health care provider if you should take aspirin to prevent strokes.  Have regular diabetes screenings. This involves taking a blood sample to check your fasting blood sugar level.  If you are at a normal weight and have a low risk for diabetes,  have this test once every three years after 71 years of age.  If you are overweight and have a high risk for diabetes, consider being tested at a younger age or more often. PREVENTING INFECTION  Hepatitis B  If you have a higher risk for hepatitis B, you should be screened for this virus. You are considered at high risk for hepatitis B if:  You were born in a country where hepatitis B is common. Ask your health care provider which countries are considered high risk.  Your parents were born in a high-risk country, and you have not been immunized against hepatitis B (hepatitis B vaccine).  You have HIV or AIDS.  You use needles to inject street drugs.  You live with someone who has hepatitis B.  You have had sex with someone who has hepatitis B.  You get hemodialysis treatment.  You take certain medicines for conditions, including cancer, organ transplantation, and autoimmune conditions. Hepatitis C  Blood testing is recommended for:  Everyone born from [birth year] through 1967.  Anyone with known risk factors for hepatitis C. Sexually transmitted infections (STIs)  You should be screened for sexually transmitted infections (STIs) including gonorrhea and chlamydia if:  You are sexually active and are younger than 71 years of age.  You are older than 71 years of age and your health care provider tells you that you are at risk for this type of infection.  Your sexual activity has changed since you were last screened and you are at an increased risk for chlamydia or gonorrhea. Ask your health care provider if you are at risk.  If you do not have HIV, but are at risk, it may be recommended that you take a prescription medicine daily to prevent HIV infection. This is called pre-exposure prophylaxis (PrEP). You are considered at risk if:  You are sexually active and do not regularly use condoms or know the HIV status of your partner(s).  You take drugs by injection.  You are sexually  active with a partner who has HIV. Talk with your health care provider about whether you are at high risk of being infected with HIV. If you choose to begin PrEP, you should first be tested for HIV. You should then be tested every 3 months for as long as you are taking PrEP.  PREGNANCY   If you are premenopausal and you may become pregnant, ask your health care provider about preconception counseling.  If you may  become pregnant, take 400 to 800 micrograms (mcg) of folic acid every day.  If you want to prevent pregnancy, talk to your health care provider about birth control (contraception). OSTEOPOROSIS AND MENOPAUSE   Osteoporosis is a disease in which the bones lose minerals and strength with aging. This can result in serious bone fractures. Your risk for osteoporosis can be identified using a bone density scan.  If you are 61 years of age or older, or if you are at risk for osteoporosis and fractures, ask your health care provider if you should be screened.  Ask your health care provider whether you should take a calcium or vitamin D supplement to lower your risk for osteoporosis.  Menopause may have certain physical symptoms and risks.  Hormone replacement therapy may reduce some of these symptoms and risks. Talk to your health care provider about whether hormone replacement therapy is right for you.  HOME CARE INSTRUCTIONS   Schedule regular health, dental, and eye exams.  Stay current with your immunizations.   Do not use any tobacco products including cigarettes, chewing tobacco, or electronic cigarettes.  If you are pregnant, do not drink alcohol.  If you are breastfeeding, limit how much and how often you drink alcohol.  Limit alcohol intake to no more than 1 drink per day for nonpregnant women. One drink equals 12 ounces of beer, 5 ounces of wine, or 1 ounces of hard liquor.  Do not use street drugs.  Do not share needles.  Ask your health care provider for help if  you need support or information about quitting drugs.  Tell your health care provider if you often feel depressed.  Tell your health care provider if you have ever been abused or do not feel safe at home.   This information is not intended to replace advice given to you by your health care provider. Make sure you discuss any questions you have with your health care provider.   Document Released: 05/05/2011 Document Revised: 11/10/2014 Document Reviewed: 09/21/2013 Elsevier Interactive Patient Education [employer].

## 2015-10-17 ENCOUNTER — Other Ambulatory Visit: Payer: Medicare Other

## 2015-10-17 NOTE — Progress Notes (Signed)
Lab only 

## 2015-10-18 ENCOUNTER — Other Ambulatory Visit: Payer: Self-pay | Admitting: Family

## 2015-10-18 LAB — CMP14+EGFR
ALT: 8 IU/L (ref 0–32)
AST: 14 IU/L (ref 0–40)
Albumin/Globulin Ratio: 1.5 (ref 1.1–2.5)
Albumin: 3.8 g/dL (ref 3.5–4.8)
Alkaline Phosphatase: 108 IU/L (ref 39–117)
BUN/Creatinine Ratio: 15 (ref 11–26)
BUN: 13 mg/dL (ref 8–27)
Bilirubin Total: 0.4 mg/dL (ref 0.0–1.2)
CO2: 29 mmol/L (ref 18–29)
Calcium: 9.6 mg/dL (ref 8.7–10.3)
Chloride: 99 mmol/L (ref 96–106)
Creatinine, Ser: 0.86 mg/dL (ref 0.57–1.00)
GFR calc Af Amer: 79 mL/min/{1.73_m2} (ref >59)
GFR calc non Af Amer: 68 mL/min/{1.73_m2} (ref >59)
Globulin, Total: 2.6 g/dL (ref 1.5–4.5)
Glucose: 109 mg/dL — ABNORMAL HIGH (ref 65–99)
Potassium: 4 mmol/L (ref 3.5–5.2)
Sodium: 142 mmol/L (ref 134–144)
Total Protein: 6.4 g/dL (ref 6.0–8.5)

## 2015-10-18 LAB — LIPID PANEL
Chol/HDL Ratio: 4.3 ratio units (ref 0.0–4.4)
Cholesterol, Total: 158 mg/dL (ref 100–199)
HDL: 37 mg/dL — ABNORMAL LOW (ref >39)
LDL Calculated: 84 mg/dL (ref 0–99)
Triglycerides: 184 mg/dL — ABNORMAL HIGH (ref 0–149)
VLDL Cholesterol Cal: 37 mg/dL (ref 5–40)

## 2015-10-18 LAB — VITAMIN D 25 HYDROXY (VIT D DEFICIENCY, FRACTURES): Vit D, 25-Hydroxy: 27.1 ng/mL — ABNORMAL LOW (ref 30.0–100.0)

## 2015-10-25 DIAGNOSIS — Z13228 Encounter for screening for other metabolic disorders: Secondary | ICD-10-CM | POA: Diagnosis not present

## 2015-11-06 ENCOUNTER — Other Ambulatory Visit: Payer: Self-pay | Admitting: Family

## 2015-11-06 DIAGNOSIS — Z1231 Encounter for screening mammogram for malignant neoplasm of breast: Secondary | ICD-10-CM | POA: Diagnosis not present

## 2015-11-07 LAB — HM MAMMOGRAPHY: HM Mammogram: NEGATIVE

## 2015-11-19 ENCOUNTER — Encounter: Payer: Self-pay | Admitting: Family Medicine

## 2015-11-19 ENCOUNTER — Ambulatory Visit (INDEPENDENT_AMBULATORY_CARE_PROVIDER_SITE_OTHER): Payer: Medicare HMO | Admitting: Family Medicine

## 2015-11-19 VITALS — BP 126/61 | HR 67 | Temp 97.6°F | Ht 62.0 in | Wt 178.0 lb

## 2015-11-19 DIAGNOSIS — M792 Neuralgia and neuritis, unspecified: Secondary | ICD-10-CM

## 2015-11-19 MED ORDER — GABAPENTIN 600 MG PO TABS: ORAL_TABLET | ORAL | Status: DC

## 2015-11-19 NOTE — Progress Notes (Signed)
Subjective:  Patient ID: Charlotte Aguirre, female    DOB: 08/08/1946  Age: 70 y.o. MRN: [id number]  CC: skin is sore on back   HPI Ashanta Amoroso Mclouth presents for burning sensation to touch at back to right of midline at level of angle of scapula/T10. Onset 1 week ago. Previously occurred several months ago. Eventualy resolved. Had another similar episode of 3 lesions, one atright lower lumbar at posterior axillary line, two at right lower abd. Roughly dermatomal  T12-L1  History Tikita has a past medical history of GERD (gastroesophageal reflux disease); Fibromyalgia; Peripheral neuropathy ([place]); Arthritis; Hypertension; Hyperlipidemia; IBS (irritable bowel syndrome); Headache(784.0); Ulcer; Vitamin D insufficiency; Memory changes; Cataract; Osteopenia; and Wrist fracture (2013).   She has past surgical history that includes Cholecystectomy; Colonoscopy (N/A, 03/22/2014); Abdominal hysterectomy; and Oophorectomy.   Her family history includes COPD in her sister; Cancer in her brother and brother; Dementia in her brother; Diabetes in her sister; Glaucoma in her father; Heart attack in her brother, brother, brother, father, and sister; Heart failure in her brother, father, mother, and sister; Obesity in her sister; Osteoporosis in her sister; Thyroid disease in her son.She reports that she quit smoking about 27 years ago. Her smoking use included Cigarettes. She has never used smokeless tobacco. She reports that she does not drink alcohol or use illicit drugs.  Outpatient Prescriptions Prior to Visit  Medication Sig Dispense Refill  . aspirin 81 MG chewable tablet Chew by mouth daily.    . calcium-vitamin D (OSCAL WITH D) 500-200 MG-UNIT per tablet Take 1 tablet by mouth 2 (two) times daily.    . Cholecalciferol (VITAMIN D-3 PO) Take 1 tablet by mouth 2 (two) times daily. 1043m    . fluticasone (FLONASE) 50 MCG/ACT nasal spray Place 2 sprays into both nostrils daily. 16 g 6  . hydrochlorothiazide  (HYDRODIURIL) 25 MG tablet Take 1 tablet by mouth  daily 90 tablet 1  . Multiple Vitamins-Minerals (MULTIVITAMINS THER. W/MINERALS) TABS tablet Take 1 tablet by mouth daily.    . Omega-3 Fatty Acids (FISH OIL) 500 MG CAPS Take 2 capsules by mouth 2 (two) times daily.     . pantoprazole (PROTONIX) 40 MG tablet Take 1 tablet by mouth  daily 90 tablet 0  . polyethylene glycol powder (GLYCOLAX/MIRALAX) powder Take 17 g by mouth 2 (two) times daily as needed. 3350 g 10  . simvastatin (ZOCOR) 40 MG tablet Take 1 tablet by mouth  daily 90 tablet 0  . gabapentin (NEURONTIN) 600 MG tablet Take 2 tablets by mouth two times daily 360 tablet 0  . clotrimazole (LOTRIMIN) 1 % cream Reported on 11/19/2015     No facility-administered medications prior to visit.    ROS Review of Systems  Constitutional: Negative for fever, activity change and appetite change.  HENT: Negative for congestion, rhinorrhea and sore throat.   Eyes: Negative for visual disturbance.  Respiratory: Negative for cough and shortness of breath.   Cardiovascular: Negative for chest pain and palpitations.  Gastrointestinal: Negative for nausea, abdominal pain and diarrhea.  Genitourinary: Negative for dysuria.  Musculoskeletal: Negative for myalgias and arthralgias.  Skin: Negative.   Hematological: Negative.   Psychiatric/Behavioral: Negative.     Objective:  BP 126/61 mmHg  Pulse 67  Temp(Src) 97.6 F (36.4 C) (Oral)  Ht 5' 2"  (1.575 m)  Wt 178 lb (80.74 kg)  BMI 32.55 kg/m2  BP Readings from Last 3 Encounters:  11/19/15 126/61  10/16/15 115/68  09/13/15 125/59  Wt Readings from Last 3 Encounters:  11/19/15 178 lb (80.74 kg)  10/16/15 175 lb (79.379 kg)  09/13/15 178 lb 9.6 oz (81.012 kg)     Physical Exam  Constitutional: She is oriented to person, place, and time. She appears well-developed and well-nourished. No distress.  HENT:  Head: Normocephalic and atraumatic.  Right Ear: External ear normal.  Left  Ear: External ear normal.  Nose: Nose normal.  Mouth/Throat: Oropharynx is clear and moist.  Eyes: Conjunctivae and EOM are normal. Pupils are equal, round, and reactive to light.  Neck: Normal range of motion. Neck supple. No thyromegaly present.  Cardiovascular: Normal rate, regular rhythm and normal heart sounds.   No murmur heard. Pulmonary/Chest: Effort normal and breath sounds normal. No respiratory distress. She has no wheezes. She has no rales.  Abdominal: Soft. Bowel sounds are normal. She exhibits no distension. There is no tenderness.  Musculoskeletal: Normal range of motion. She exhibits tenderness (see history of present illness).  Lymphadenopathy:    She has no cervical adenopathy.  Neurological: She is alert and oriented to person, place, and time. She has normal reflexes.  Skin: Skin is warm and dry.  Psychiatric: She has a normal mood and affect. Her behavior is normal. Judgment and thought content normal.     Lab Results  Component Value Date   WBC 8.8 11/19/2015   HGB 13.5 04/03/2015   HCT 42.6 11/19/2015   PLT 254 11/19/2015   GLUCOSE 98 11/19/2015   Aguirre 158 10/16/2015   TRIG 184* 10/16/2015   HDL 37* 10/16/2015   LDLCALC 84 10/16/2015   ALT 13 11/19/2015   AST 20 11/19/2015   NA 144 11/19/2015   K 3.9 11/19/2015   CL 99 11/19/2015   CREATININE 0.86 11/19/2015   BUN 15 11/19/2015   CO2 30* 11/19/2015   TSH 1.680 10/31/2014   HGBA1C  02/16/2009    5.6 (NOTE) The ADA recommends the following therapeutic goal for glycemic control related to Hgb A1c measurement: Goal of therapy: <6.5 Hgb A1c  Reference: American Diabetes Association: Clinical Practice Recommendations 2010, Diabetes Care, 2010, 33: (Suppl  1).    Dg Esophagus  09/06/2015  CLINICAL DATA:  Dysphagia with solids x3 months, history of esophageal dilatation EXAM: ESOPHOGRAM / BARIUM SWALLOW / BARIUM TABLET STUDY TECHNIQUE: Combined double contrast and single contrast examination performed using  effervescent crystals, thick barium liquid, and thin barium liquid. The patient was observed with fluoroscopy swallowing a 13 mm barium sulphate tablet. FLUOROSCOPY TIME:  Fluoroscopy Time:  1 minutes 18 seconds Number of Acquired Images:  14 COMPARISON:  None. FINDINGS: Normal esophageal peristalsis. No fixed esophageal narrowing or stricture. A 13 mm barium tablet passed into the stomach without delay. Small to moderate hiatal hernia in the prone position. Mild esophageal reflux is suspected, although the patient had difficulty clearing her esophagus in the prone position. IMPRESSION: No fixed esophageal narrowing or stricture. Small to moderate hiatal hernia. Mild esophageal reflux is suspected. Electronically Signed   By: Julian Hy M.D.   On: 09/06/2015 09:58    Assessment & Plan:   Jaylina was seen today for skin is sore on back.  Diagnoses and all orders for this visit:  Neuritis -     Ambulatory referral to Neurology -     Sedimentation rate -     CBC with Differential/Platelet -     CMP14+EGFR  Other orders -     gabapentin (NEURONTIN) 600 MG tablet; Take 2 tablets by  mouth teach morning, three each evening   I have changed Ms. Zimmerle's gabapentin. I am also having her maintain her aspirin, Cholecalciferol (VITAMIN D-3 PO), Fish Oil, multivitamins ther. w/minerals, fluticasone, calcium-vitamin D, polyethylene glycol powder, simvastatin, pantoprazole, clotrimazole, and hydrochlorothiazide.  Meds ordered this encounter  Medications  . gabapentin (NEURONTIN) 600 MG tablet    Sig: Take 2 tablets by mouth teach morning, three each evening    Dispense:  450 tablet    Refill:  1     Follow-up: No Follow-up on file.  [name], M.D.

## 2015-11-20 ENCOUNTER — Telehealth: Payer: Self-pay | Admitting: Family Medicine

## 2015-11-20 LAB — CMP14+EGFR
ALT: 13 IU/L (ref 0–32)
AST: 20 IU/L (ref 0–40)
Albumin/Globulin Ratio: 1.4 (ref 1.1–2.5)
Albumin: 3.9 g/dL (ref 3.5–4.8)
Alkaline Phosphatase: 125 IU/L — ABNORMAL HIGH (ref 39–117)
BUN/Creatinine Ratio: 17 (ref 11–26)
BUN: 15 mg/dL (ref 8–27)
Bilirubin Total: 0.2 mg/dL (ref 0.0–1.2)
CO2: 30 mmol/L — ABNORMAL HIGH (ref 18–29)
Calcium: 9.8 mg/dL (ref 8.7–10.3)
Chloride: 99 mmol/L (ref 96–106)
Creatinine, Ser: 0.86 mg/dL (ref 0.57–1.00)
GFR calc Af Amer: 79 mL/min/{1.73_m2} (ref >59)
GFR calc non Af Amer: 68 mL/min/{1.73_m2} (ref >59)
Globulin, Total: 2.7 g/dL (ref 1.5–4.5)
Glucose: 98 mg/dL (ref 65–99)
Potassium: 3.9 mmol/L (ref 3.5–5.2)
Sodium: 144 mmol/L (ref 134–144)
Total Protein: 6.6 g/dL (ref 6.0–8.5)

## 2015-11-20 LAB — CBC WITH DIFFERENTIAL/PLATELET
Basophils Absolute: 0.1 10*3/uL (ref 0.0–0.2)
Basos: 1 %
EOS (ABSOLUTE): 0.2 10*3/uL (ref 0.0–0.4)
Eos: 3 %
Hematocrit: 42.6 % (ref 34.0–46.6)
Hemoglobin: 14.1 g/dL (ref 11.1–15.9)
Immature Grans (Abs): 0 10*3/uL (ref 0.0–0.1)
Immature Granulocytes: 0 %
Lymphocytes Absolute: 2.6 10*3/uL (ref 0.7–3.1)
Lymphs: 30 %
MCH: 27.4 pg (ref 26.6–33.0)
MCHC: 33.1 g/dL (ref 31.5–35.7)
MCV: 83 fL (ref 79–97)
Monocytes Absolute: 0.5 10*3/uL (ref 0.1–0.9)
Monocytes: 6 %
Neutrophils Absolute: 5.4 10*3/uL (ref 1.4–7.0)
Neutrophils: 60 %
Platelets: 254 10*3/uL (ref 150–379)
RBC: 5.14 x10E6/uL (ref 3.77–5.28)
RDW: 14.1 % (ref 12.3–15.4)
WBC: 8.8 10*3/uL (ref 3.4–10.8)

## 2015-11-20 LAB — SEDIMENTATION RATE: Sed Rate: 8 mm/hr (ref 0–40)

## 2015-11-20 NOTE — Telephone Encounter (Signed)
[facility] changed to [place] and pt told to expect a call from them for an appointment

## 2015-11-27 DIAGNOSIS — H35372 Puckering of macula, left eye: Secondary | ICD-10-CM | POA: Diagnosis not present

## 2015-11-27 DIAGNOSIS — Z0100 Encounter for examination of eyes and vision without abnormal findings: Secondary | ICD-10-CM | POA: Diagnosis not present

## 2015-11-27 DIAGNOSIS — H5200 Hypermetropia, unspecified eye: Secondary | ICD-10-CM | POA: Diagnosis not present

## 2015-12-24 DIAGNOSIS — I10 Essential (primary) hypertension: Secondary | ICD-10-CM | POA: Diagnosis not present

## 2015-12-24 DIAGNOSIS — K219 Gastro-esophageal reflux disease without esophagitis: Secondary | ICD-10-CM | POA: Diagnosis not present

## 2015-12-24 DIAGNOSIS — E785 Hyperlipidemia, unspecified: Secondary | ICD-10-CM | POA: Diagnosis not present

## 2016-01-17 ENCOUNTER — Ambulatory Visit (INDEPENDENT_AMBULATORY_CARE_PROVIDER_SITE_OTHER): Payer: Medicare HMO | Admitting: Family

## 2016-01-17 ENCOUNTER — Encounter: Payer: Self-pay | Admitting: Family

## 2016-01-17 VITALS — BP 119/58 | HR 69 | Temp 97.5°F | Ht 62.0 in | Wt 179.2 lb

## 2016-01-17 DIAGNOSIS — R002 Palpitations: Secondary | ICD-10-CM | POA: Diagnosis not present

## 2016-01-17 MED ORDER — PANTOPRAZOLE SODIUM 40 MG PO TBEC: DELAYED_RELEASE_TABLET | ORAL | Status: DC

## 2016-01-17 MED ORDER — HYDROCHLOROTHIAZIDE 25 MG PO TABS: ORAL_TABLET | ORAL | Status: DC

## 2016-01-17 MED ORDER — GABAPENTIN 600 MG PO TABS: ORAL_TABLET | ORAL | Status: DC

## 2016-01-17 MED ORDER — SIMVASTATIN 40 MG PO TABS: ORAL_TABLET | ORAL | Status: DC

## 2016-01-17 NOTE — Progress Notes (Signed)
   Subjective:    Patient ID: Charlotte Aguirre, female    DOB: 02-28-1946, 70 y.o.   MRN: [id number]  Pt presents to the office today with chronic palppitations. Pt states she has had these "almost my whole life".  Pt states she was told by her home health nurse she had a murmur and wanted to get it checked out.  Palpitations  This is a chronic problem. The current episode started more than 1 year ago. The problem occurs intermittently. The problem has been unchanged. Pertinent negatives include no anxiety, coughing, diaphoresis, dizziness, nausea or shortness of breath. The treatment provided mild relief. Risk factors include obesity.      Review of Systems  Constitutional: Negative.  Negative for diaphoresis.  HENT: Negative.   Eyes: Negative.   Respiratory: Negative.  Negative for cough and shortness of breath.   Cardiovascular: Positive for palpitations.  Gastrointestinal: Negative.  Negative for nausea.  Endocrine: Negative.   Genitourinary: Negative.   Musculoskeletal: Negative.   Neurological: Negative.  Negative for dizziness and headaches.  Hematological: Negative.   Psychiatric/Behavioral: Negative.  The patient is not nervous/anxious.   All other systems reviewed and are negative.      Objective:   Physical Exam  Constitutional: She is oriented to person, place, and time. She appears well-developed and well-nourished. No distress.  HENT:  Head: Normocephalic and atraumatic.  Eyes: Pupils are equal, round, and reactive to light.  Neck: Normal range of motion. Neck supple. No thyromegaly present.  Cardiovascular: Normal rate, regular rhythm, normal heart sounds and intact distal pulses.   No murmur heard. Pulmonary/Chest: Effort normal and breath sounds normal. No respiratory distress. She has no wheezes.  Abdominal: Soft. Bowel sounds are normal. She exhibits no distension. There is no tenderness.  Musculoskeletal: Normal range of motion. She exhibits no edema or  tenderness.  Neurological: She is alert and oriented to person, place, and time.  Skin: Skin is warm and dry.  Psychiatric: She has a normal mood and affect. Her behavior is normal. Judgment and thought content normal.  Vitals reviewed.  EKG- Sinus Rhythm   BP 119/58 mmHg  Pulse 69  Temp(Src) 97.5 F (36.4 C) (Oral)  Ht 5\' 2"  (1.575 m)  Wt 179 lb 3.2 oz (81.285 kg)  BMI 32.77 kg/m2     Assessment & Plan:  1. Palpitations -Avoid caffeine  -Stress management discussed - EKG 12-Lead   Continue all meds Labs pending Health Maintenance reviewed Diet and exercise encouraged RTO as needed and keep chronic follow up appts with PCP  Evelina Dun, FNP

## 2016-01-17 NOTE — Patient Instructions (Signed)

## 2016-01-21 ENCOUNTER — Encounter: Payer: Self-pay | Admitting: *Deleted

## 2016-03-06 ENCOUNTER — Ambulatory Visit (INDEPENDENT_AMBULATORY_CARE_PROVIDER_SITE_OTHER): Payer: Medicare HMO

## 2016-03-06 ENCOUNTER — Ambulatory Visit (INDEPENDENT_AMBULATORY_CARE_PROVIDER_SITE_OTHER): Payer: Medicare HMO | Admitting: Family

## 2016-03-06 ENCOUNTER — Encounter: Payer: Self-pay | Admitting: Family

## 2016-03-06 VITALS — BP 131/73 | HR 82 | Temp 97.0°F | Resp 17 | Wt 182.0 lb

## 2016-03-06 DIAGNOSIS — M25551 Pain in right hip: Secondary | ICD-10-CM | POA: Diagnosis not present

## 2016-03-06 MED ORDER — KETOROLAC TROMETHAMINE 60 MG/2ML IM SOLN
60.0000 mg | Freq: Once | INTRAMUSCULAR | Status: AC
  Administered 2016-03-06: 60 mg via INTRAMUSCULAR

## 2016-03-06 MED ORDER — METHYLPREDNISOLONE ACETATE 80 MG/ML IJ SUSP
80.0000 mg | Freq: Once | INTRAMUSCULAR | Status: AC
  Administered 2016-03-06: 80 mg via INTRAMUSCULAR

## 2016-03-06 MED ORDER — NAPROXEN 500 MG PO TABS: 500.0000 mg | ORAL_TABLET | Freq: Two times a day (BID) | ORAL | Status: DC

## 2016-03-06 MED ORDER — CYCLOBENZAPRINE HCL 5 MG PO TABS: 5.0000 mg | ORAL_TABLET | Freq: Three times a day (TID) | ORAL | Status: DC | PRN

## 2016-03-06 NOTE — Patient Instructions (Signed)

## 2016-03-06 NOTE — Progress Notes (Signed)
   Subjective:    Patient ID: Charlotte Aguirre, female    DOB: Jun 25, 1946, 70 y.o.   MRN: [id number]  Hip Pain  The incident occurred 2 days ago. There was no injury mechanism. The pain is present in the right hip. The quality of the pain is described as aching. The pain is at a severity of 9/10. The pain is moderate. The pain has been constant since onset. Associated symptoms include an inability to bear weight. Pertinent negatives include no loss of sensation, numbness or tingling. She reports no foreign bodies present. The symptoms are aggravated by movement and weight bearing. She has tried acetaminophen and NSAIDs for the symptoms. The treatment provided mild relief.      Review of Systems  Neurological: Negative for tingling and numbness.  All other systems reviewed and are negative.      Objective:   Physical Exam  Constitutional: She is oriented to person, place, and time. She appears well-developed and well-nourished. No distress.  Eyes: Pupils are equal, round, and reactive to light.  Neck: Normal range of motion. Neck supple. No thyromegaly present.  Cardiovascular: Normal rate, regular rhythm, normal heart sounds and intact distal pulses.   No murmur heard. Pulmonary/Chest: Effort normal and breath sounds normal. No respiratory distress. She has no wheezes.  Abdominal: Soft. Bowel sounds are normal. She exhibits no distension. There is no tenderness.  Musculoskeletal: Normal range of motion. She exhibits no edema or tenderness.  Neurological: She is alert and oriented to person, place, and time.  Skin: Skin is warm and dry.  Psychiatric: She has a normal mood and affect. Her behavior is normal. Judgment and thought content normal.  Vitals reviewed.   BP 131/73 mmHg  Pulse 82  Temp(Src) 97 F (36.1 C) (Oral)  Resp 17  Wt 182 lb (82.555 kg)       Assessment & Plan:  1. Hip pain, right -Rest -Ice and heat as needed -Sedation precautions discussed -RTO in 2 weeks -  DG HIP UNILAT W OR W/O PELVIS 2-3 VIEWS RIGHT; Future - methylPREDNISolone acetate (DEPO-MEDROL) injection 80 mg; Inject 1 mL (80 mg total) into the muscle once. - ketorolac (TORADOL) injection 60 mg; Inject 2 mLs (60 mg total) into the muscle once. - naproxen (NAPROSYN) 500 MG tablet; Take 1 tablet (500 mg total) by mouth 2 (two) times daily with a meal.  Dispense: 60 tablet; Refill: 1 - cyclobenzaprine (FLEXERIL) 5 MG tablet; Take 1 tablet (5 mg total) by mouth 3 (three) times daily as needed for muscle spasms.  Dispense: 30 tablet; Refill: 0  Evelina Dun, FNP

## 2016-04-15 ENCOUNTER — Ambulatory Visit (INDEPENDENT_AMBULATORY_CARE_PROVIDER_SITE_OTHER): Payer: Medicare HMO | Admitting: Family

## 2016-04-15 ENCOUNTER — Encounter: Payer: Self-pay | Admitting: Family

## 2016-04-15 VITALS — BP 138/71 | HR 59 | Temp 97.6°F | Ht 62.0 in | Wt 180.4 lb

## 2016-04-15 DIAGNOSIS — J309 Allergic rhinitis, unspecified: Secondary | ICD-10-CM

## 2016-04-15 DIAGNOSIS — M1990 Unspecified osteoarthritis, unspecified site: Secondary | ICD-10-CM | POA: Diagnosis not present

## 2016-04-15 DIAGNOSIS — K219 Gastro-esophageal reflux disease without esophagitis: Secondary | ICD-10-CM

## 2016-04-15 DIAGNOSIS — K581 Irritable bowel syndrome with constipation: Secondary | ICD-10-CM

## 2016-04-15 DIAGNOSIS — E559 Vitamin D deficiency, unspecified: Secondary | ICD-10-CM | POA: Diagnosis not present

## 2016-04-15 DIAGNOSIS — E8881 Metabolic syndrome: Secondary | ICD-10-CM | POA: Insufficient documentation

## 2016-04-15 DIAGNOSIS — E669 Obesity, unspecified: Secondary | ICD-10-CM

## 2016-04-15 DIAGNOSIS — K5901 Slow transit constipation: Secondary | ICD-10-CM

## 2016-04-15 DIAGNOSIS — G609 Hereditary and idiopathic neuropathy, unspecified: Secondary | ICD-10-CM

## 2016-04-15 DIAGNOSIS — M8580 Other specified disorders of bone density and structure, unspecified site: Secondary | ICD-10-CM

## 2016-04-15 DIAGNOSIS — I10 Essential (primary) hypertension: Secondary | ICD-10-CM | POA: Diagnosis not present

## 2016-04-15 DIAGNOSIS — G500 Trigeminal neuralgia: Secondary | ICD-10-CM | POA: Diagnosis not present

## 2016-04-15 DIAGNOSIS — E785 Hyperlipidemia, unspecified: Secondary | ICD-10-CM

## 2016-04-15 MED ORDER — SIMVASTATIN 40 MG PO TABS: ORAL_TABLET | ORAL | Status: DC

## 2016-04-15 MED ORDER — PANTOPRAZOLE SODIUM 40 MG PO TBEC: DELAYED_RELEASE_TABLET | ORAL | Status: DC

## 2016-04-15 NOTE — Progress Notes (Signed)
Subjective:    Patient ID: MAREENA CAVAN, female    DOB: 08/02/1946, 70 y.o.   MRN: [id number]  Pt presents to the office today for chronic follow up. Pt had her rectocele repaired on 10/10/15. Pt states she is doing well.  Hypertension This is a chronic problem. The current episode started more than 1 year ago. The problem has been resolved since onset. The problem is controlled. Associated symptoms include blurred vision. Pertinent negatives include no anxiety, headaches, malaise/fatigue, palpitations, peripheral edema or shortness of breath. Risk factors for coronary artery disease include dyslipidemia, obesity, post-menopausal state and sedentary lifestyle. Past treatments include diuretics. The current treatment provides significant improvement. There is no history of kidney disease, CAD/MI, CVA, heart failure or a thyroid problem. There is no history of sleep apnea.  Hyperlipidemia This is a chronic problem. The current episode started more than 1 year ago. The problem is controlled. Recent lipid tests were reviewed and are normal. She has no history of diabetes. Pertinent negatives include no leg pain or shortness of breath. Current antihyperlipidemic treatment includes statins. The current treatment provides moderate improvement of lipids. Risk factors for coronary artery disease include dyslipidemia, family history, hypertension, obesity and post-menopausal.  Gastroesophageal Reflux She reports no belching, no coughing, no heartburn, no nausea or no sore throat. The current episode started more than 1 year ago. The problem occurs rarely. The problem has been resolved. Risk factors include obesity. She has tried a PPI for the symptoms. The treatment provided moderate relief.  Constipation This is a chronic problem. The current episode started more than 1 month ago. The problem has been resolved since onset. Her stool frequency is 4 to 5 times per week. The patient is not on a high fiber diet.  Pertinent negatives include no bloating, diarrhea, hematochezia or nausea. Risk factors include obesity. She has tried laxatives for the symptoms. The treatment provided significant relief.  Peripheral Neuropathy PT currently taking Gabapentin 1200 mg in AM and 2419m in the evening. PT states this seems to help a little. PT some days are better than others.  Osteopenia Pt currently taking Calcium and Vit D daily. PT's last Dexascan 09/13/2014   Review of Systems  Constitutional: Negative.  Negative for malaise/fatigue.  HENT: Negative.  Negative for sore throat.   Eyes: Positive for blurred vision.  Respiratory: Negative.  Negative for cough and shortness of breath.   Cardiovascular: Negative.  Negative for palpitations.  Gastrointestinal: Positive for constipation. Negative for heartburn, nausea, diarrhea, hematochezia and bloating.  Endocrine: Negative.   Genitourinary: Negative.   Neurological: Negative.  Negative for headaches.  Hematological: Negative.   Psychiatric/Behavioral: Negative.   All other systems reviewed and are negative.      Objective:   Physical Exam  Constitutional: She is oriented to person, place, and time. She appears well-developed and well-nourished. No distress.  HENT:  Head: Normocephalic and atraumatic.  Right Ear: External ear normal.  Left Ear: External ear normal.  Nose: Nose normal.  Mouth/Throat: Oropharynx is clear and moist.  Eyes: Pupils are equal, round, and reactive to light.  Neck: Normal range of motion. Neck supple. No thyromegaly present.  Cardiovascular: Normal rate, regular rhythm, normal heart sounds and intact distal pulses.   No murmur heard. Pulmonary/Chest: Effort normal and breath sounds normal. No respiratory distress. She has no wheezes.  Abdominal: Soft. Bowel sounds are normal. She exhibits no distension. There is no tenderness.  Musculoskeletal: Normal range of motion. She exhibits no edema or  tenderness.  Neurological:  She is alert and oriented to person, place, and time. She has normal reflexes. No cranial nerve deficit.  Skin: Skin is warm and dry.  Psychiatric: She has a normal mood and affect. Her behavior is normal. Judgment and thought content normal.  Vitals reviewed.     BP 138/71 mmHg  Pulse 59  Temp(Src) 97.6 F (36.4 C) (Oral)  Ht 5' 2"  (1.575 m)  Wt 180 lb 6.4 oz (81.829 kg)  BMI 32.99 kg/m2     Assessment & Plan:  1. Essential hypertension - CMP14+EGFR  2. Allergic rhinitis, unspecified allergic rhinitis type - CMP14+EGFR  3. Gastroesophageal reflux disease without esophagitis - pantoprazole (PROTONIX) 40 MG tablet; Take 1 tablet by mouth  daily  Dispense: 90 tablet; Refill: 2 - CMP14+EGFR  4. Irritable bowel syndrome with constipation - CMP14+EGFR  5. Hereditary and idiopathic peripheral neuropathy - CMP14+EGFR  6. Trigeminal neuralgia syndrome - CMP14+EGFR  7. Osteoarthritis, unspecified osteoarthritis type, unspecified site - CMP14+EGFR  8. Osteopenia - CMP14+EGFR - VITAMIN D 25 Hydroxy (Vit-D Deficiency, Fractures)  9. Hyperlipidemia with target LDL less than 100 - simvastatin (ZOCOR) 40 MG tablet; Take 1 tablet by mouth  daily  Dispense: 90 tablet; Refill: 2 - CMP14+EGFR - Lipid panel  10. Vitamin D deficiency - CMP14+EGFR - VITAMIN D 25 Hydroxy (Vit-D Deficiency, Fractures)  11. Slow transit constipation - CMP14+EGFR  12. Obesity (BMI 30-39.9) - CMP14+EGFR  13. Metabolic syndrome - HEB78+NJNG   Continue all meds Labs pending Health Maintenance reviewed Diet and exercise encouraged RTO 6 months  Evelina Dun, FNP

## 2016-04-15 NOTE — Patient Instructions (Signed)
Health Maintenance, Female Adopting a healthy lifestyle and getting preventive care can go a long way to promote health and wellness. Talk with your health care provider about what schedule of regular examinations is right for you. This is a good chance for you to check in with your provider about disease prevention and staying healthy. In between checkups, there are plenty of things you can do on your own. Experts have done a lot of research about which lifestyle changes and preventive measures are most likely to keep you healthy. Ask your health care provider for more information. WEIGHT AND DIET  Eat a healthy diet  Be sure to include plenty of vegetables, fruits, low-fat dairy products, and lean protein.  Do not eat a lot of foods high in solid fats, added sugars, or salt.  Get regular exercise. This is one of the most important things you can do for your health.  Most adults should exercise for at least 150 minutes each week. The exercise should increase your heart rate and make you sweat (moderate-intensity exercise).  Most adults should also do strengthening exercises at least twice a week. This is in addition to the moderate-intensity exercise.  Maintain a healthy weight  Body mass index (BMI) is a measurement that can be used to identify possible weight problems. It estimates body fat based on height and weight. Your health care provider can help determine your BMI and help you achieve or maintain a healthy weight.  For females 20 years of age and older:   A BMI below 18.5 is considered underweight.  A BMI of 18.5 to 24.9 is normal.  A BMI of 25 to 29.9 is considered overweight.  A BMI of 30 and above is considered obese.  Watch levels of cholesterol and blood lipids  You should start having your blood tested for lipids and cholesterol at 72 years of age, then have this test every 5 years.  You may need to have your cholesterol levels checked more often if:  Your lipid  or cholesterol levels are high.  You are older than 72 years of age.  You are at high risk for heart disease.  CANCER SCREENING   Lung Cancer  Lung cancer screening is recommended for adults 55-80 years old who are at high risk for lung cancer because of a history of smoking.  A yearly low-dose CT scan of the lungs is recommended for people who:  Currently smoke.  Have quit within the past 15 years.  Have at least a 30-pack-year history of smoking. A pack year is smoking an average of one pack of cigarettes a day for 1 year.  Yearly screening should continue until it has been 15 years since you quit.  Yearly screening should stop if you develop a health problem that would prevent you from having lung cancer treatment.  Breast Cancer  Practice breast self-awareness. This means understanding how your breasts normally appear and feel.  It also means doing regular breast self-exams. Let your health care provider know about any changes, no matter how small.  If you are in your 20s or 30s, you should have a clinical breast exam (CBE) by a health care provider every 1-3 years as part of a regular health exam.  If you are 40 or older, have a CBE every year. Also consider having a breast X-ray (mammogram) every year.  If you have a family history of breast cancer, talk to your health care provider about genetic screening.  If you   are at high risk for breast cancer, talk to your health care provider about having an MRI and a mammogram every year.  Breast cancer gene (BRCA) assessment is recommended for women who have family members with BRCA-related cancers. BRCA-related cancers include:  Breast.  Ovarian.  Tubal.  Peritoneal cancers.  Results of the assessment will determine the need for genetic counseling and BRCA1 and BRCA2 testing. Cervical Cancer Your health care provider may recommend that you be screened regularly for cancer of the pelvic organs (ovaries, uterus, and  vagina). This screening involves a pelvic examination, including checking for microscopic changes to the surface of your cervix (Pap test). You may be encouraged to have this screening done every 3 years, beginning at age 21.  For women ages 30-65, health care providers may recommend pelvic exams and Pap testing every 3 years, or they may recommend the Pap and pelvic exam, combined with testing for human papilloma virus (HPV), every 5 years. Some types of HPV increase your risk of cervical cancer. Testing for HPV may also be done on women of any age with unclear Pap test results.  Other health care providers may not recommend any screening for nonpregnant women who are considered low risk for pelvic cancer and who do not have symptoms. Ask your health care provider if a screening pelvic exam is right for you.  If you have had past treatment for cervical cancer or a condition that could lead to cancer, you need Pap tests and screening for cancer for at least 20 years after your treatment. If Pap tests have been discontinued, your risk factors (such as having a new sexual partner) need to be reassessed to determine if screening should resume. Some women have medical problems that increase the chance of getting cervical cancer. In these cases, your health care provider may recommend more frequent screening and Pap tests. Colorectal Cancer  This type of cancer can be detected and often prevented.  Routine colorectal cancer screening usually begins at 72 years of age and continues through 72 years of age.  Your health care provider may recommend screening at an earlier age if you have risk factors for colon cancer.  Your health care provider may also recommend using home test kits to check for hidden blood in the stool.  A small camera at the end of a tube can be used to examine your colon directly (sigmoidoscopy or colonoscopy). This is done to check for the earliest forms of colorectal  cancer.  Routine screening usually begins at age 50.  Direct examination of the colon should be repeated every 5-10 years through 72 years of age. However, you may need to be screened more often if early forms of precancerous polyps or small growths are found. Skin Cancer  Check your skin from head to toe regularly.  Tell your health care provider about any new moles or changes in moles, especially if there is a change in a mole's shape or color.  Also tell your health care provider if you have a mole that is larger than the size of a pencil eraser.  Always use sunscreen. Apply sunscreen liberally and repeatedly throughout the day.  Protect yourself by wearing long sleeves, pants, a wide-brimmed hat, and sunglasses whenever you are outside. HEART DISEASE, DIABETES, AND HIGH BLOOD PRESSURE   High blood pressure causes heart disease and increases the risk of stroke. High blood pressure is more likely to develop in:  People who have blood pressure in the high end   of the normal range (130-139/85-89 mm Hg).  People who are overweight or obese.  People who are African American.  If you are 38-23 years of age, have your blood pressure checked every 3-5 years. If you are 61 years of age or older, have your blood pressure checked every year. You should have your blood pressure measured twice--once when you are at a hospital or clinic, and once when you are not at a hospital or clinic. Record the average of the two measurements. To check your blood pressure when you are not at a hospital or clinic, you can use:  An automated blood pressure machine at a pharmacy.  A home blood pressure monitor.  If you are between 45 years and 39 years old, ask your health care provider if you should take aspirin to prevent strokes.  Have regular diabetes screenings. This involves taking a blood sample to check your fasting blood sugar level.  If you are at a normal weight and have a low risk for diabetes,  have this test once every three years after 72 years of age.  If you are overweight and have a high risk for diabetes, consider being tested at a younger age or more often. PREVENTING INFECTION  Hepatitis B  If you have a higher risk for hepatitis B, you should be screened for this virus. You are considered at high risk for hepatitis B if:  You were born in a country where hepatitis B is common. Ask your health care provider which countries are considered high risk.  Your parents were born in a high-risk country, and you have not been immunized against hepatitis B (hepatitis B vaccine).  You have HIV or AIDS.  You use needles to inject street drugs.  You live with someone who has hepatitis B.  You have had sex with someone who has hepatitis B.  You get hemodialysis treatment.  You take certain medicines for conditions, including cancer, organ transplantation, and autoimmune conditions. Hepatitis C  Blood testing is recommended for:  Everyone born from [birth year] through 1967.  Anyone with known risk factors for hepatitis C. Sexually transmitted infections (STIs)  You should be screened for sexually transmitted infections (STIs) including gonorrhea and chlamydia if:  You are sexually active and are younger than 72 years of age.  You are older than 72 years of age and your health care provider tells you that you are at risk for this type of infection.  Your sexual activity has changed since you were last screened and you are at an increased risk for chlamydia or gonorrhea. Ask your health care provider if you are at risk.  If you do not have HIV, but are at risk, it may be recommended that you take a prescription medicine daily to prevent HIV infection. This is called pre-exposure prophylaxis (PrEP). You are considered at risk if:  You are sexually active and do not regularly use condoms or know the HIV status of your partner(s).  You take drugs by injection.  You are sexually  active with a partner who has HIV. Talk with your health care provider about whether you are at high risk of being infected with HIV. If you choose to begin PrEP, you should first be tested for HIV. You should then be tested every 3 months for as long as you are taking PrEP.  PREGNANCY   If you are premenopausal and you may become pregnant, ask your health care provider about preconception counseling.  If you may  become pregnant, take 400 to 800 micrograms (mcg) of folic acid every day.  If you want to prevent pregnancy, talk to your health care provider about birth control (contraception). OSTEOPOROSIS AND MENOPAUSE   Osteoporosis is a disease in which the bones lose minerals and strength with aging. This can result in serious bone fractures. Your risk for osteoporosis can be identified using a bone density scan.  If you are 61 years of age or older, or if you are at risk for osteoporosis and fractures, ask your health care provider if you should be screened.  Ask your health care provider whether you should take a calcium or vitamin D supplement to lower your risk for osteoporosis.  Menopause may have certain physical symptoms and risks.  Hormone replacement therapy may reduce some of these symptoms and risks. Talk to your health care provider about whether hormone replacement therapy is right for you.  HOME CARE INSTRUCTIONS   Schedule regular health, dental, and eye exams.  Stay current with your immunizations.   Do not use any tobacco products including cigarettes, chewing tobacco, or electronic cigarettes.  If you are pregnant, do not drink alcohol.  If you are breastfeeding, limit how much and how often you drink alcohol.  Limit alcohol intake to no more than 1 drink per day for nonpregnant women. One drink equals 12 ounces of beer, 5 ounces of wine, or 1 ounces of hard liquor.  Do not use street drugs.  Do not share needles.  Ask your health care provider for help if  you need support or information about quitting drugs.  Tell your health care provider if you often feel depressed.  Tell your health care provider if you have ever been abused or do not feel safe at home.   This information is not intended to replace advice given to you by your health care provider. Make sure you discuss any questions you have with your health care provider.   Document Released: 05/05/2011 Document Revised: 11/10/2014 Document Reviewed: 09/21/2013 Elsevier Interactive Patient Education [employer].

## 2016-04-16 ENCOUNTER — Ambulatory Visit (INDEPENDENT_AMBULATORY_CARE_PROVIDER_SITE_OTHER): Payer: Medicare HMO | Admitting: Family Medicine

## 2016-04-16 VITALS — BP 117/69 | HR 74 | Temp 98.4°F | Ht 62.0 in | Wt 182.6 lb

## 2016-04-16 DIAGNOSIS — S80912A Unspecified superficial injury of left knee, initial encounter: Secondary | ICD-10-CM | POA: Diagnosis not present

## 2016-04-16 DIAGNOSIS — W57XXXA Bitten or stung by nonvenomous insect and other nonvenomous arthropods, initial encounter: Secondary | ICD-10-CM

## 2016-04-16 LAB — LIPID PANEL
Chol/HDL Ratio: 3.2 ratio units (ref 0.0–4.4)
Cholesterol, Total: 154 mg/dL (ref 100–199)
HDL: 48 mg/dL (ref >39)
LDL Calculated: 79 mg/dL (ref 0–99)
Triglycerides: 134 mg/dL (ref 0–149)
VLDL Cholesterol Cal: 27 mg/dL (ref 5–40)

## 2016-04-16 LAB — CMP14+EGFR
ALT: 7 IU/L (ref 0–32)
AST: 14 IU/L (ref 0–40)
Albumin/Globulin Ratio: 1.3 (ref 1.2–2.2)
Albumin: 3.7 g/dL (ref 3.5–4.8)
Alkaline Phosphatase: 122 IU/L — ABNORMAL HIGH (ref 39–117)
BUN/Creatinine Ratio: 25 (ref 12–28)
BUN: 18 mg/dL (ref 8–27)
Bilirubin Total: 0.2 mg/dL (ref 0.0–1.2)
CO2: 26 mmol/L (ref 18–29)
Calcium: 9.4 mg/dL (ref 8.7–10.3)
Chloride: 101 mmol/L (ref 96–106)
Creatinine, Ser: 0.71 mg/dL (ref 0.57–1.00)
GFR calc Af Amer: 99 mL/min/{1.73_m2} (ref >59)
GFR calc non Af Amer: 86 mL/min/{1.73_m2} (ref >59)
Globulin, Total: 2.8 g/dL (ref 1.5–4.5)
Glucose: 97 mg/dL (ref 65–99)
Potassium: 4 mmol/L (ref 3.5–5.2)
Sodium: 145 mmol/L — ABNORMAL HIGH (ref 134–144)
Total Protein: 6.5 g/dL (ref 6.0–8.5)

## 2016-04-16 LAB — VITAMIN D 25 HYDROXY (VIT D DEFICIENCY, FRACTURES): Vit D, 25-Hydroxy: 29.1 ng/mL — ABNORMAL LOW (ref 30.0–100.0)

## 2016-04-16 MED ORDER — DOXYCYCLINE HYCLATE 100 MG PO TABS: 200.0000 mg | ORAL_TABLET | Freq: Once | ORAL | Status: DC

## 2016-04-16 NOTE — Progress Notes (Signed)
   Subjective:  Patient ID: Charlotte Aguirre, female    DOB: 1946-08-03  Age: 70 y.o. MRN: [id number]  CC:  [name] presents for tick bite.  HPI: Pt. Noted tick attached at left posterior knee earlier today. Removed without difficulty. Had been in tick infested area recently. Size consistent with deer tick. Non engorged. Pt. Denies fever, chills, sweats or rash   0: Pt. In no acute distress.      Heent benign     Ht: RRR no MGR     Lungs CTA      The area of the bite at left posterior knee was inspected and found to be moderately erythematous at the site of removal.     No retained parts visualized. Some moderate local edema, <1  cm  noted.      No rash appreciated locally or remote from site.  A: Tick Bite, suspicious for Lymes infestation. P: Doxycycline 200 mg p.o. X 1 on an empty stomach.      Monitor for fever, rash      Follow up as needed.  [name], M.D.  Review of Systems   Physical Exam

## 2016-05-01 ENCOUNTER — Telehealth: Payer: Self-pay | Admitting: Family

## 2016-05-01 NOTE — Telephone Encounter (Signed)
Pt is using [employer] not [employer], fixed, pt aware

## 2016-05-20 DIAGNOSIS — H35372 Puckering of macula, left eye: Secondary | ICD-10-CM | POA: Diagnosis not present

## 2016-05-20 DIAGNOSIS — H353131 Nonexudative age-related macular degeneration, bilateral, early dry stage: Secondary | ICD-10-CM | POA: Diagnosis not present

## 2016-05-20 DIAGNOSIS — H43813 Vitreous degeneration, bilateral: Secondary | ICD-10-CM | POA: Diagnosis not present

## 2016-05-20 DIAGNOSIS — H43393 Other vitreous opacities, bilateral: Secondary | ICD-10-CM | POA: Diagnosis not present

## 2016-06-04 ENCOUNTER — Encounter (INDEPENDENT_AMBULATORY_CARE_PROVIDER_SITE_OTHER): Payer: Self-pay | Admitting: Internal Medicine

## 2016-07-03 ENCOUNTER — Encounter: Payer: Self-pay | Admitting: Family

## 2016-07-03 ENCOUNTER — Ambulatory Visit (INDEPENDENT_AMBULATORY_CARE_PROVIDER_SITE_OTHER): Payer: Medicare HMO | Admitting: Family

## 2016-07-03 VITALS — BP 113/69 | HR 68 | Temp 97.4°F | Ht 62.0 in | Wt 182.4 lb

## 2016-07-03 DIAGNOSIS — M5431 Sciatica, right side: Secondary | ICD-10-CM | POA: Diagnosis not present

## 2016-07-03 DIAGNOSIS — M25551 Pain in right hip: Secondary | ICD-10-CM

## 2016-07-03 MED ORDER — PREDNISONE 10 MG (21) PO TBPK: ORAL_TABLET | ORAL | 0 refills | Status: DC

## 2016-07-03 MED ORDER — CYCLOBENZAPRINE HCL 5 MG PO TABS: 5.0000 mg | ORAL_TABLET | Freq: Three times a day (TID) | ORAL | 0 refills | Status: DC | PRN

## 2016-07-03 MED ORDER — NAPROXEN 500 MG PO TABS: 500.0000 mg | ORAL_TABLET | Freq: Two times a day (BID) | ORAL | 1 refills | Status: DC

## 2016-07-03 NOTE — Progress Notes (Signed)
   Subjective:    Patient ID: CECLIA DILES, female    DOB: [birth year], 70 y.o.   MRN: [id number]  Back Pain  This is a recurrent problem. The current episode started in the past 7 days. The problem occurs constantly. The pain is present in the gluteal. The quality of the pain is described as aching. The pain does not radiate. The pain is at a severity of 2/10. The pain is moderate. The symptoms are aggravated by bending and standing. Associated symptoms include numbness and tingling. Pertinent negatives include no bladder incontinence, bowel incontinence, dysuria or headaches. Risk factors include obesity. She has tried bed rest, heat and ice for the symptoms. The treatment provided mild relief.      Review of Systems  Constitutional: Negative.   HENT: Negative.   Eyes: Negative.   Respiratory: Negative.  Negative for shortness of breath.   Cardiovascular: Negative.  Negative for palpitations.  Gastrointestinal: Negative.  Negative for bowel incontinence.  Endocrine: Negative.   Genitourinary: Negative.  Negative for bladder incontinence and dysuria.  Musculoskeletal: Positive for back pain.  Neurological: Positive for tingling and numbness. Negative for headaches.  Hematological: Negative.   Psychiatric/Behavioral: Negative.   All other systems reviewed and are negative.      Objective:   Physical Exam  Constitutional: She is oriented to person, place, and time. She appears well-developed and well-nourished. No distress.  HENT:  Head: Normocephalic.  Eyes: Pupils are equal, round, and reactive to light.  Neck: Normal range of motion. Neck supple. No thyromegaly present.  Cardiovascular: Normal rate, regular rhythm, normal heart sounds and intact distal pulses.   No murmur heard. Pulmonary/Chest: Effort normal and breath sounds normal. No respiratory distress. She has no wheezes.  Abdominal: Soft. Bowel sounds are normal. She exhibits no distension. There is no tenderness.    Musculoskeletal: Normal range of motion. She exhibits tenderness (right gluteal ). She exhibits no edema.  Neurological: She is alert and oriented to person, place, and time.  Skin: Skin is warm and dry.  Psychiatric: She has a normal mood and affect. Her behavior is normal. Judgment and thought content normal.  Vitals reviewed.    BP 113/69   Pulse 68   Temp 97.4 F (36.3 C) (Oral)   Ht 5\' 2"  (1.575 m)   Wt 182 lb 6.4 oz (82.7 kg)   BMI 33.36 kg/m       Assessment & Plan:  1. Sciatica of right side -Rest -ROM exercises discussed -Take naprosyn with food BID for next 5-7 days -RTO prn - predniSONE (STERAPRED UNI-PAK 21 TAB) 10 MG (21) TBPK tablet; Use as directed  Dispense: 21 tablet; Refill: 0 - naproxen (NAPROSYN) 500 MG tablet; Take 1 tablet (500 mg total) by mouth 2 (two) times daily with a meal.  Dispense: 60 tablet; Refill: 1 - cyclobenzaprine (FLEXERIL) 5 MG tablet; Take 1 tablet (5 mg total) by mouth 3 (three) times daily as needed for muscle spasms.  Dispense: 30 tablet; Refill: 0  2. Hip pain, right - naproxen (NAPROSYN) 500 MG tablet; Take 1 tablet (500 mg total) by mouth 2 (two) times daily with a meal.  Dispense: 60 tablet; Refill: 1 - cyclobenzaprine (FLEXERIL) 5 MG tablet; Take 1 tablet (5 mg total) by mouth 3 (three) times daily as needed for muscle spasms.  Dispense: 30 tablet; Refill: 0   Evelina Dun, FNP

## 2016-07-03 NOTE — Patient Instructions (Signed)
Sciatica With Rehab The sciatic nerve runs from the back down the leg and is responsible for sensation and control of the muscles in the back (posterior) side of the thigh, lower leg, and foot. Sciatica is a condition that is characterized by inflammation of this nerve.  SYMPTOMS   Signs of nerve damage, including numbness and/or weakness along the posterior side of the lower extremity.  Pain in the back of the thigh that may also travel down the leg.  Pain that worsens when sitting for long periods of time.  Occasionally, pain in the back or buttock. CAUSES  Inflammation of the sciatic nerve is the cause of sciatica. The inflammation is due to something irritating the nerve. Common sources of irritation include:  Sitting for long periods of time.  Direct trauma to the nerve.  Arthritis of the spine.  Herniated or ruptured disk.  Slipping of the vertebrae (spondylolisthesis).  Pressure from soft tissues, such as muscles or ligament-like tissue (fascia). RISK INCREASES WITH:  Sports that place pressure or stress on the spine (football or weightlifting).  Poor strength and flexibility.  Failure to warm up properly before activity.  Family history of low back pain or disk disorders.  Previous back injury or surgery.  Poor body mechanics, especially when lifting, or poor posture. PREVENTION   Warm up and stretch properly before activity.  Maintain physical fitness:  Strength, flexibility, and endurance.  Cardiovascular fitness.  Learn and use proper technique, especially with posture and lifting. When possible, have coach correct improper technique.  Avoid activities that place stress on the spine. PROGNOSIS If treated properly, then sciatica usually resolves within 6 weeks. However, occasionally surgery is necessary.  RELATED COMPLICATIONS   Permanent nerve damage, including pain, numbness, tingle, or weakness.  Chronic back pain.  Risks of surgery: infection,  bleeding, nerve damage, or damage to surrounding tissues. TREATMENT Treatment initially involves resting from any activities that aggravate your symptoms. The use of ice and medication may help reduce pain and inflammation. The use of strengthening and stretching exercises may help reduce pain with activity. These exercises may be performed at home or with referral to a therapist. A therapist may recommend further treatments, such as transcutaneous electronic nerve stimulation (TENS) or ultrasound. Your caregiver may recommend corticosteroid injections to help reduce inflammation of the sciatic nerve. If symptoms persist despite non-surgical (conservative) treatment, then surgery may be recommended. MEDICATION  If pain medication is necessary, then nonsteroidal anti-inflammatory medications, such as aspirin and ibuprofen, or other minor pain relievers, such as acetaminophen, are often recommended.  Do not take pain medication for 7 days before surgery.  Prescription pain relievers may be given if deemed necessary by your caregiver. Use only as directed and only as much as you need.  Ointments applied to the skin may be helpful.  Corticosteroid injections may be given by your caregiver. These injections should be reserved for the most serious cases, because they may only be given a certain number of times. HEAT AND COLD  Cold treatment (icing) relieves pain and reduces inflammation. Cold treatment should be applied for 10 to 15 minutes every 2 to 3 hours for inflammation and pain and immediately after any activity that aggravates your symptoms. Use ice packs or massage the area with a piece of ice (ice massage).  Heat treatment may be used prior to performing the stretching and strengthening activities prescribed by your caregiver, physical therapist, or athletic trainer. Use a heat pack or soak the injury in warm water.   SEEK MEDICAL CARE IF:  Treatment seems to offer no benefit, or the condition  worsens.  Any medications produce adverse side effects. EXERCISES  RANGE OF MOTION (ROM) AND STRETCHING EXERCISES - Sciatica Most people with sciatic will find that their symptoms worsen with either excessive bending forward (flexion) or arching at the low back (extension). The exercises which will help resolve your symptoms will focus on the opposite motion. Your physician, physical therapist or athletic trainer will help you determine which exercises will be most helpful to resolve your low back pain. Do not complete any exercises without first consulting with your clinician. Discontinue any exercises which worsen your symptoms until you speak to your clinician. If you have pain, numbness or tingling which travels down into your buttocks, leg or foot, the goal of the therapy is for these symptoms to move closer to your back and eventually resolve. Occasionally, these leg symptoms will get better, but your low back pain may worsen; this is typically an indication of progress in your rehabilitation. Be certain to be very alert to any changes in your symptoms and the activities in which you participated in the 24 hours prior to the change. Sharing this information with your clinician will allow him/her to most efficiently treat your condition. These exercises may help you when beginning to rehabilitate your injury. Your symptoms may resolve with or without further involvement from your physician, physical therapist or athletic trainer. While completing these exercises, remember:   Restoring tissue flexibility helps normal motion to return to the joints. This allows healthier, less painful movement and activity.  An effective stretch should be held for at least 30 seconds.  A stretch should never be painful. You should only feel a gentle lengthening or release in the stretched tissue. FLEXION RANGE OF MOTION AND STRETCHING EXERCISES: STRETCH - Flexion, Single Knee to Chest   Lie on a firm bed or floor  with both legs extended in front of you.  Keeping one leg in contact with the floor, bring your opposite knee to your chest. Hold your leg in place by either grabbing behind your thigh or at your knee.  Pull until you feel a gentle stretch in your low back. Hold __________ seconds.  Slowly release your grasp and repeat the exercise with the opposite side. Repeat __________ times. Complete this exercise __________ times per day.  STRETCH - Flexion, Double Knee to Chest  Lie on a firm bed or floor with both legs extended in front of you.  Keeping one leg in contact with the floor, bring your opposite knee to your chest.  Tense your stomach muscles to support your back and then lift your other knee to your chest. Hold your legs in place by either grabbing behind your thighs or at your knees.  Pull both knees toward your chest until you feel a gentle stretch in your low back. Hold __________ seconds.  Tense your stomach muscles and slowly return one leg at a time to the floor. Repeat __________ times. Complete this exercise __________ times per day.  STRETCH - Low Trunk Rotation   Lie on a firm bed or floor. Keeping your legs in front of you, bend your knees so they are both pointed toward the ceiling and your feet are flat on the floor.  Extend your arms out to the side. This will stabilize your upper body by keeping your shoulders in contact with the floor.  Gently and slowly drop both knees together to one side until   you feel a gentle stretch in your low back. Hold for __________ seconds.  Tense your stomach muscles to support your low back as you bring your knees back to the starting position. Repeat the exercise to the other side. Repeat __________ times. Complete this exercise __________ times per day  EXTENSION RANGE OF MOTION AND FLEXIBILITY EXERCISES: STRETCH - Extension, Prone on Elbows  Lie on your stomach on the floor, a bed will be too soft. Place your palms about shoulder  width apart and at the height of your head.  Place your elbows under your shoulders. If this is too painful, stack pillows under your chest.  Allow your body to relax so that your hips drop lower and make contact more completely with the floor.  Hold this position for __________ seconds.  Slowly return to lying flat on the floor. Repeat __________ times. Complete this exercise __________ times per day.  RANGE OF MOTION - Extension, Prone Press Ups  Lie on your stomach on the floor, a bed will be too soft. Place your palms about shoulder width apart and at the height of your head.  Keeping your back as relaxed as possible, slowly straighten your elbows while keeping your hips on the floor. You may adjust the placement of your hands to maximize your comfort. As you gain motion, your hands will come more underneath your shoulders.  Hold this position __________ seconds.  Slowly return to lying flat on the floor. Repeat __________ times. Complete this exercise __________ times per day.  STRENGTHENING EXERCISES - Sciatica  These exercises may help you when beginning to rehabilitate your injury. These exercises should be done near your "sweet spot." This is the neutral, low-back arch, somewhere between fully rounded and fully arched, that is your least painful position. When performed in this safe range of motion, these exercises can be used for people who have either a flexion or extension based injury. These exercises may resolve your symptoms with or without further involvement from your physician, physical therapist or athletic trainer. While completing these exercises, remember:   Muscles can gain both the endurance and the strength needed for everyday activities through controlled exercises.  Complete these exercises as instructed by your physician, physical therapist or athletic trainer. Progress with the resistance and repetition exercises only as your caregiver advises.  You may  experience muscle soreness or fatigue, but the pain or discomfort you are trying to eliminate should never worsen during these exercises. If this pain does worsen, stop and make certain you are following the directions exactly. If the pain is still present after adjustments, discontinue the exercise until you can discuss the trouble with your clinician. STRENGTHENING - Deep Abdominals, Pelvic Tilt   Lie on a firm bed or floor. Keeping your legs in front of you, bend your knees so they are both pointed toward the ceiling and your feet are flat on the floor.  Tense your lower abdominal muscles to press your low back into the floor. This motion will rotate your pelvis so that your tail bone is scooping upwards rather than pointing at your feet or into the floor.  With a gentle tension and even breathing, hold this position for __________ seconds. Repeat __________ times. Complete this exercise __________ times per day.  STRENGTHENING - Abdominals, Crunches   Lie on a firm bed or floor. Keeping your legs in front of you, bend your knees so they are both pointed toward the ceiling and your feet are flat on the   floor. Cross your arms over your chest.  Slightly tip your chin down without bending your neck.  Tense your abdominals and slowly lift your trunk high enough to just clear your shoulder blades. Lifting higher can put excessive stress on the low back and does not further strengthen your abdominal muscles.  Control your return to the starting position. Repeat __________ times. Complete this exercise __________ times per day.  STRENGTHENING - Quadruped, Opposite UE/LE Lift  Assume a hands and knees position on a firm surface. Keep your hands under your shoulders and your knees under your hips. You may place padding under your knees for comfort.  Find your neutral spine and gently tense your abdominal muscles so that you can maintain this position. Your shoulders and hips should form a rectangle  that is parallel with the floor and is not twisted.  Keeping your trunk steady, lift your right hand no higher than your shoulder and then your left leg no higher than your hip. Make sure you are not holding your breath. Hold this position __________ seconds.  Continuing to keep your abdominal muscles tense and your back steady, slowly return to your starting position. Repeat with the opposite arm and leg. Repeat __________ times. Complete this exercise __________ times per day.  STRENGTHENING - Abdominals and Quadriceps, Straight Leg Raise   Lie on a firm bed or floor with both legs extended in front of you.  Keeping one leg in contact with the floor, bend the other knee so that your foot can rest flat on the floor.  Find your neutral spine, and tense your abdominal muscles to maintain your spinal position throughout the exercise.  Slowly lift your straight leg off the floor about 6 inches for a count of 15, making sure to not hold your breath.  Still keeping your neutral spine, slowly lower your leg all the way to the floor. Repeat this exercise with each leg __________ times. Complete this exercise __________ times per day. POSTURE AND BODY MECHANICS CONSIDERATIONS - Sciatica Keeping correct posture when sitting, standing or completing your activities will reduce the stress put on different body tissues, allowing injured tissues a chance to heal and limiting painful experiences. The following are general guidelines for improved posture. Your physician or physical therapist will provide you with any instructions specific to your needs. While reading these guidelines, remember:  The exercises prescribed by your provider will help you have the flexibility and strength to maintain correct postures.  The correct posture provides the optimal environment for your joints to work. All of your joints have less wear and tear when properly supported by a spine with good posture. This means you will  experience a healthier, less painful body.  Correct posture must be practiced with all of your activities, especially prolonged sitting and standing. Correct posture is as important when doing repetitive low-stress activities (typing) as it is when doing a single heavy-load activity (lifting). RESTING POSITIONS Consider which positions are most painful for you when choosing a resting position. If you have pain with flexion-based activities (sitting, bending, stooping, squatting), choose a position that allows you to rest in a less flexed posture. You would want to avoid curling into a fetal position on your side. If your pain worsens with extension-based activities (prolonged standing, working overhead), avoid resting in an extended position such as sleeping on your stomach. Most people will find more comfort when they rest with their spine in a more neutral position, neither too rounded nor too   arched. Lying on a non-sagging bed on your side with a pillow between your knees, or on your back with a pillow under your knees will often provide some relief. Keep in mind, being in any one position for a prolonged period of time, no matter how correct your posture, can still lead to stiffness. PROPER SITTING POSTURE In order to minimize stress and discomfort on your spine, you must sit with correct posture Sitting with good posture should be effortless for a healthy body. Returning to good posture is a gradual process. Many people can work toward this most comfortably by using various supports until they have the flexibility and strength to maintain this posture on their own. When sitting with proper posture, your ears will fall over your shoulders and your shoulders will fall over your hips. You should use the back of the chair to support your upper back. Your low back will be in a neutral position, just slightly arched. You may place a small pillow or folded towel at the base of your low back for support.  When  working at a desk, create an environment that supports good, upright posture. Without extra support, muscles fatigue and lead to excessive strain on joints and other tissues. Keep these recommendations in mind: CHAIR:   A chair should be able to slide under your desk when your back makes contact with the back of the chair. This allows you to work closely.  The chair's height should allow your eyes to be level with the upper part of your monitor and your hands to be slightly lower than your elbows. BODY POSITION  Your feet should make contact with the floor. If this is not possible, use a foot rest.  Keep your ears over your shoulders. This will reduce stress on your neck and low back. INCORRECT SITTING POSTURES   If you are feeling tired and unable to assume a healthy sitting posture, do not slouch or slump. This puts excessive strain on your back tissues, causing more damage and pain. Healthier options include:  Using more support, like a lumbar pillow.  Switching tasks to something that requires you to be upright or walking.  Talking a brief walk.  Lying down to rest in a neutral-spine position. PROLONGED STANDING WHILE SLIGHTLY LEANING FORWARD  When completing a task that requires you to lean forward while standing in one place for a long time, place either foot up on a stationary 2-4 inch high object to help maintain the best posture. When both feet are on the ground, the low back tends to lose its slight inward curve. If this curve flattens (or becomes too large), then the back and your other joints will experience too much stress, fatigue more quickly and can cause pain.  CORRECT STANDING POSTURES Proper standing posture should be assumed with all daily activities, even if they only take a few moments, like when brushing your teeth. As in sitting, your ears should fall over your shoulders and your shoulders should fall over your hips. You should keep a slight tension in your abdominal  muscles to brace your spine. Your tailbone should point down to the ground, not behind your body, resulting in an over-extended swayback posture.  INCORRECT STANDING POSTURES  Common incorrect standing postures include a forward head, locked knees and/or an excessive swayback. WALKING Walk with an upright posture. Your ears, shoulders and hips should all line-up. PROLONGED ACTIVITY IN A FLEXED POSITION When completing a task that requires you to bend forward   at your waist or lean over a low surface, try to find a way to stabilize 3 of 4 of your limbs. You can place a hand or elbow on your thigh or rest a knee on the surface you are reaching across. This will provide you more stability so that your muscles do not fatigue as quickly. By keeping your knees relaxed, or slightly bent, you will also reduce stress across your low back. CORRECT LIFTING TECHNIQUES DO :   Assume a wide stance. This will provide you more stability and the opportunity to get as close as possible to the object which you are lifting.  Tense your abdominals to brace your spine; then bend at the knees and hips. Keeping your back locked in a neutral-spine position, lift using your leg muscles. Lift with your legs, keeping your back straight.  Test the weight of unknown objects before attempting to lift them.  Try to keep your elbows locked down at your sides in order get the best strength from your shoulders when carrying an object.  Always ask for help when lifting heavy or awkward objects. INCORRECT LIFTING TECHNIQUES DO NOT:   Lock your knees when lifting, even if it is a small object.  Bend and twist. Pivot at your feet or move your feet when needing to change directions.  Assume that you cannot safely pick up a paperclip without proper posture.   This information is not intended to replace advice given to you by your health care provider. Make sure you discuss any questions you have with your health care provider.     Document Released: 10/20/2005 Document Revised: 03/06/2015 Document Reviewed: 02/01/2009 [employer] Interactive Patient Education 2016 [employer]  

## 2016-08-07 ENCOUNTER — Ambulatory Visit (INDEPENDENT_AMBULATORY_CARE_PROVIDER_SITE_OTHER): Payer: Medicare HMO

## 2016-08-07 DIAGNOSIS — Z23 Encounter for immunization: Secondary | ICD-10-CM

## 2016-08-11 ENCOUNTER — Other Ambulatory Visit: Payer: Self-pay | Admitting: Family

## 2016-08-20 ENCOUNTER — Other Ambulatory Visit: Payer: Self-pay | Admitting: Family

## 2016-08-20 ENCOUNTER — Other Ambulatory Visit: Payer: Self-pay | Admitting: *Deleted

## 2016-08-20 MED ORDER — GABAPENTIN 600 MG PO TABS: ORAL_TABLET | ORAL | 0 refills | Status: DC

## 2016-08-20 MED ORDER — GABAPENTIN 600 MG PO TABS: ORAL_TABLET | ORAL | 1 refills | Status: DC

## 2016-08-20 NOTE — Telephone Encounter (Signed)
Done, pt aware.

## 2016-08-24 IMAGING — CT CT RENAL STONE PROTOCOL
2 of 4 series · 17 of 46 positions shown, 19 images · non-contrast
Comparison: 11/01/2012

CLINICAL DATA: RIGHT lower back pain, RIGHT flank pain radiating to
RIGHT abdomen, kidney infection diagnosed [REDACTED], hypertension

EXAM:
CT ABDOMEN AND PELVIS WITHOUT CONTRAST
TECHNIQUE: Multidetector CT imaging of the abdomen and pelvis was performed
following the standard protocol without IV contrast. Sagittal and
coronal MPR images reconstructed from axial data set.

[Series 2: standard/full over (age)lbs 5.0 · axial · 0.78mm/px · z∈[-466,-26]mm · 14 of 98 slices shown, 16 images]
[im 5/98  soft-tissue]
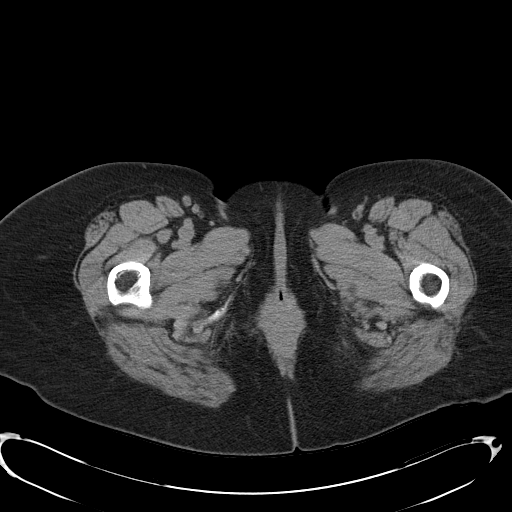
[im 5/98  bone]
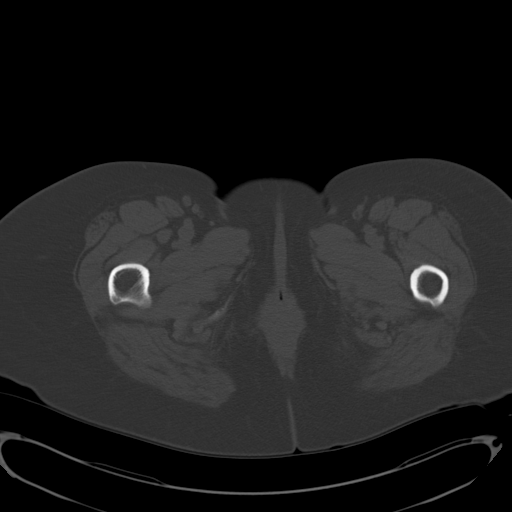
[im 13/98  soft-tissue]
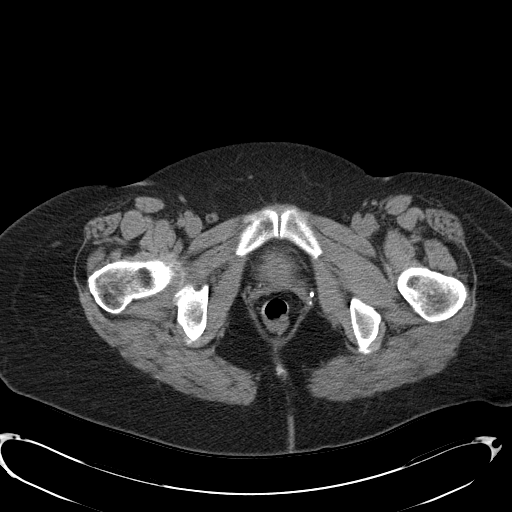
[im 21/98  soft-tissue]
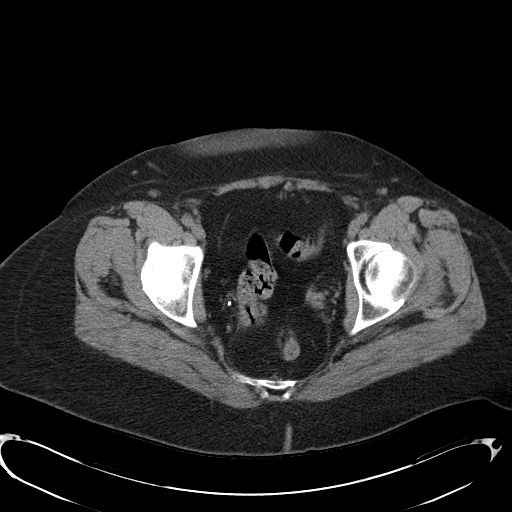
[im 25/98  soft-tissue]
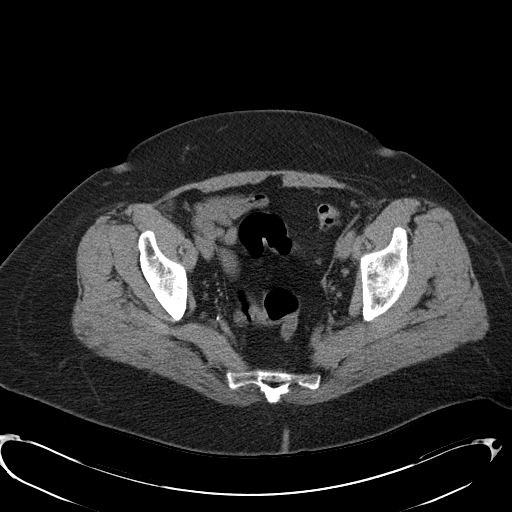
[im 33/98  soft-tissue]
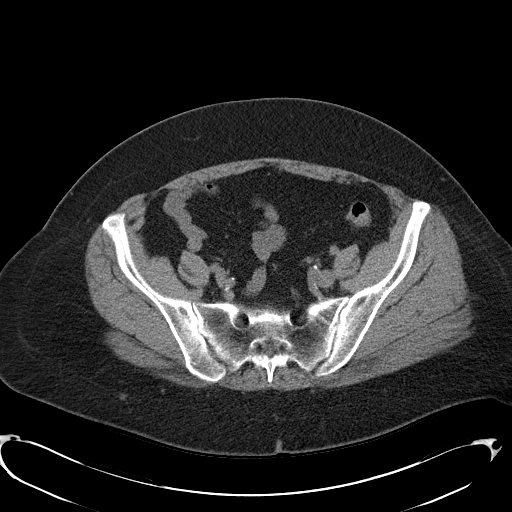
[im 41/98  soft-tissue]
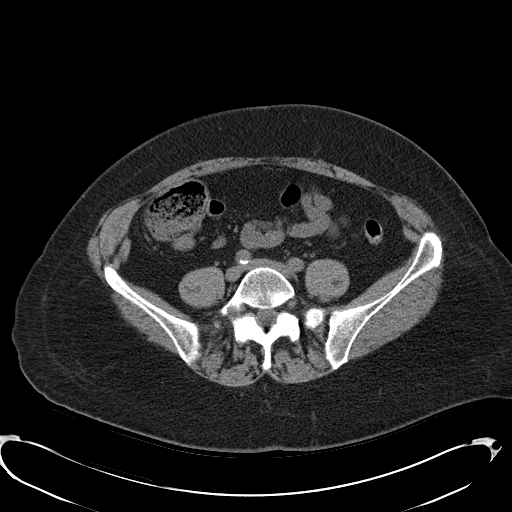
[im 45/98  soft-tissue]
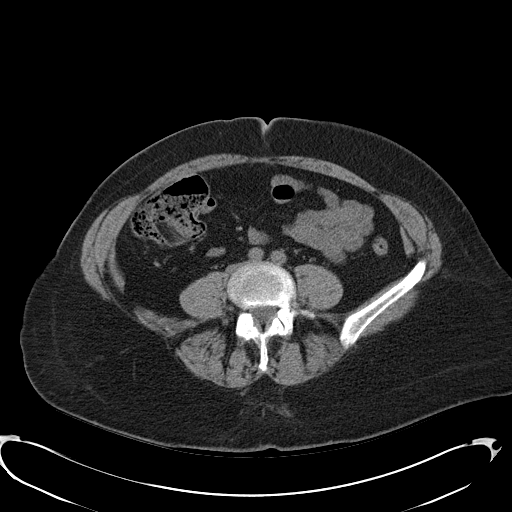
[im 53/98  soft-tissue]
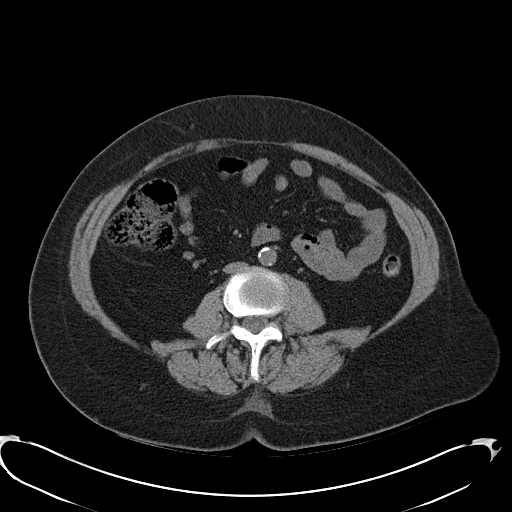
[im 57/98  soft-tissue]
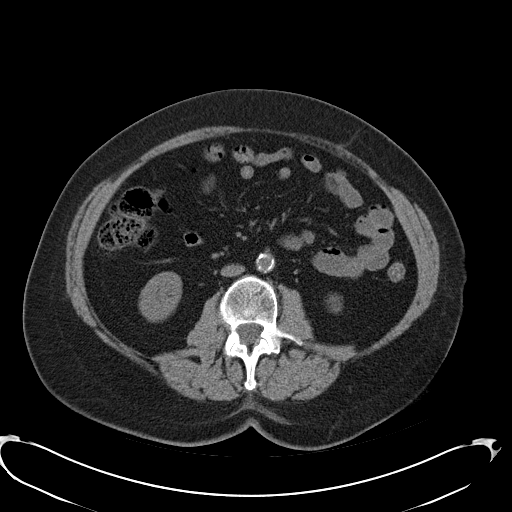
[im 57/98  bone]
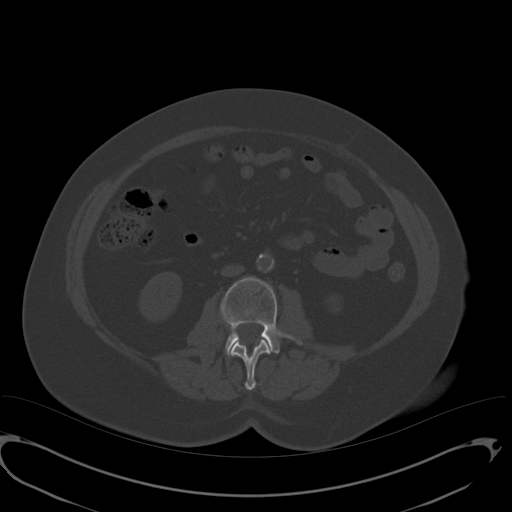
[im 65/98  soft-tissue]
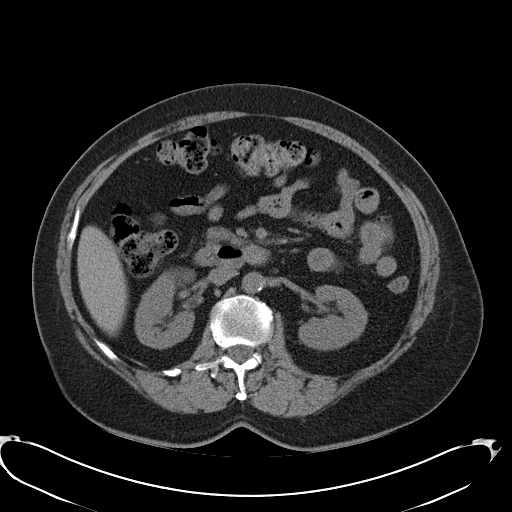
[im 73/98  soft-tissue]
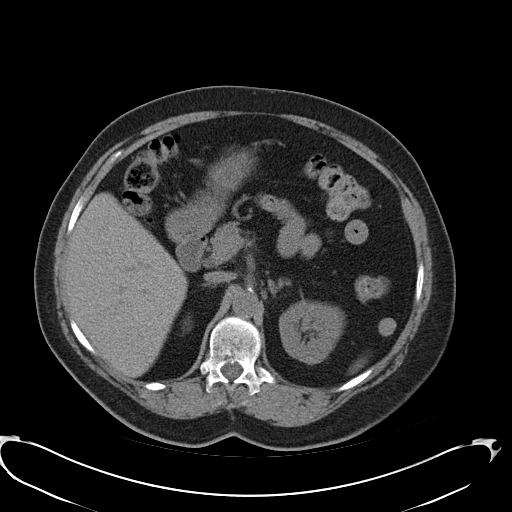
[im 77/98  soft-tissue]
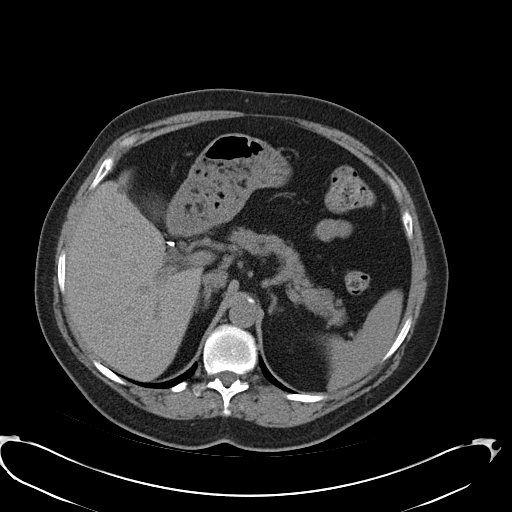
[im 85/98  soft-tissue]
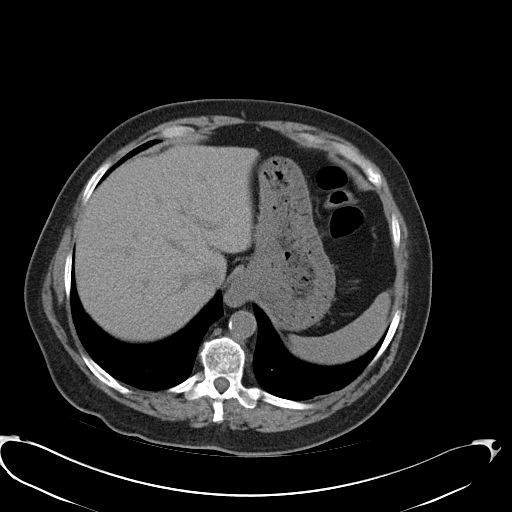
[im 93/98  soft-tissue]
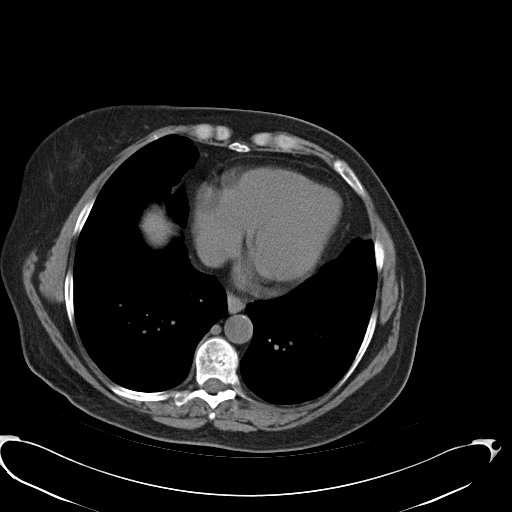

[Series 3: mpr coronal · coronal · 0.96mm/px · 3 of 87 slices shown]
[im 29/87  soft-tissue]
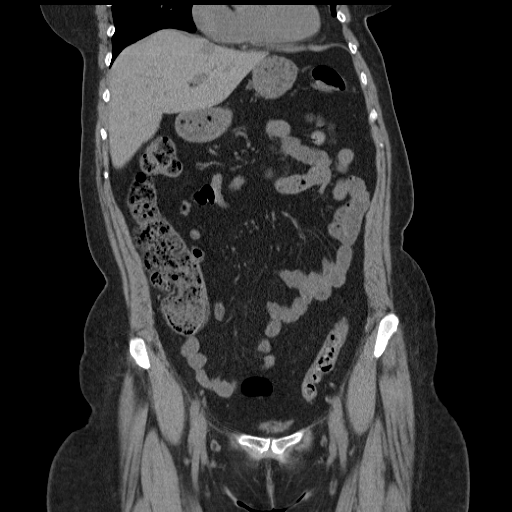
[im 39/87  soft-tissue]
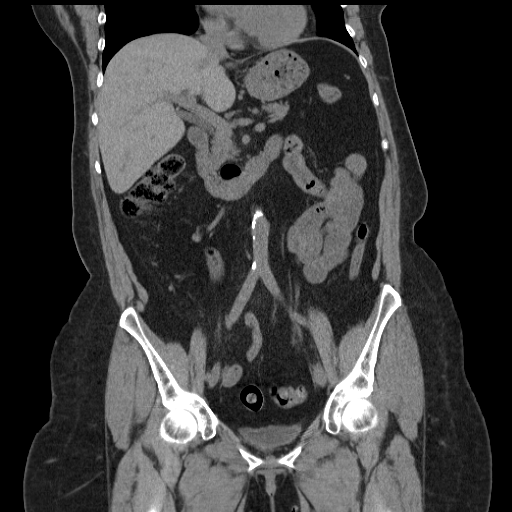
[im 48/87  soft-tissue]
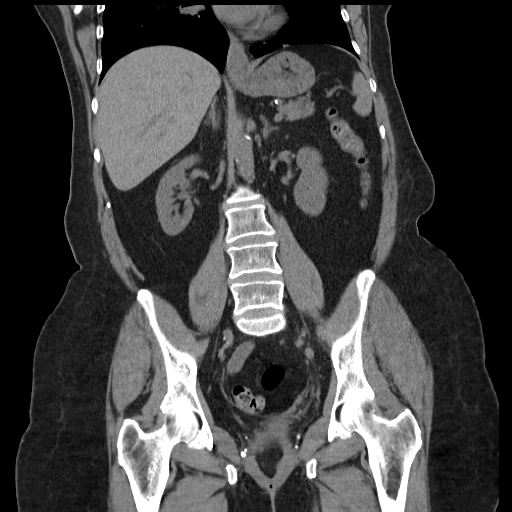

[17 of 46 positions shown; findings below may reference images not displayed]

FINDINGS: Lung bases clear.

Gallbladder surgically absent.

Low-attenuation lesion superiorly LEFT lobe liver 20 x 15 mm,
unchanged.

Cyst at anterior aspect the RIGHT kidney 2.7 x 2.1 cm image 36.

No urinary tract calcification, hydronephrosis, or ureteral
dilatation.

Liver, spleen, pancreas, kidneys, and adrenal glands otherwise
normal appearance.

Scattered atherosclerotic calcifications aorta without aneurysm.

Uterus surgically absent with nonvisualization of ovaries and
appendix.

Normal appearing bladder and ureters.

Minimal sigmoid diverticulosis without evidence of diverticulitis.

Stomach and bowel loops otherwise normal for technique.

No mass, adenopathy, free air, or free fluid.

Osseous structures unremarkable.
IMPRESSION: No evidence of urinary tract calcification or obstruction.

Slight increase in size of RIGHT renal cyst.

Stable size of a low-attenuation lesion within LEFT lobe of liver
superiorly unchanged since 4160.

Minimal sigmoid diverticulosis.

## 2016-08-25 ENCOUNTER — Ambulatory Visit (INDEPENDENT_AMBULATORY_CARE_PROVIDER_SITE_OTHER): Payer: Medicare HMO | Admitting: Family

## 2016-08-25 ENCOUNTER — Encounter: Payer: Self-pay | Admitting: Family

## 2016-08-25 DIAGNOSIS — M25551 Pain in right hip: Secondary | ICD-10-CM | POA: Diagnosis not present

## 2016-08-25 DIAGNOSIS — M5431 Sciatica, right side: Secondary | ICD-10-CM

## 2016-08-25 MED ORDER — NAPROXEN 500 MG PO TABS: 500.0000 mg | ORAL_TABLET | Freq: Two times a day (BID) | ORAL | 1 refills | Status: DC

## 2016-08-25 MED ORDER — CYCLOBENZAPRINE HCL 5 MG PO TABS: 5.0000 mg | ORAL_TABLET | Freq: Three times a day (TID) | ORAL | 1 refills | Status: DC | PRN

## 2016-08-25 MED ORDER — PREDNISONE 10 MG (21) PO TBPK: ORAL_TABLET | ORAL | 0 refills | Status: DC

## 2016-08-25 NOTE — Patient Instructions (Signed)
Sciatica With Rehab The sciatic nerve runs from the back down the leg and is responsible for sensation and control of the muscles in the back (posterior) side of the thigh, lower leg, and foot. Sciatica is a condition that is characterized by inflammation of this nerve.  SYMPTOMS   Signs of nerve damage, including numbness and/or weakness along the posterior side of the lower extremity.  Pain in the back of the thigh that may also travel down the leg.  Pain that worsens when sitting for long periods of time.  Occasionally, pain in the back or buttock. CAUSES  Inflammation of the sciatic nerve is the cause of sciatica. The inflammation is due to something irritating the nerve. Common sources of irritation include:  Sitting for long periods of time.  Direct trauma to the nerve.  Arthritis of the spine.  Herniated or ruptured disk.  Slipping of the vertebrae (spondylolisthesis).  Pressure from soft tissues, such as muscles or ligament-like tissue (fascia). RISK INCREASES WITH:  Sports that place pressure or stress on the spine (football or weightlifting).  Poor strength and flexibility.  Failure to warm up properly before activity.  Family history of low back pain or disk disorders.  Previous back injury or surgery.  Poor body mechanics, especially when lifting, or poor posture. PREVENTION   Warm up and stretch properly before activity.  Maintain physical fitness:  Strength, flexibility, and endurance.  Cardiovascular fitness.  Learn and use proper technique, especially with posture and lifting. When possible, have coach correct improper technique.  Avoid activities that place stress on the spine. PROGNOSIS If treated properly, then sciatica usually resolves within 6 weeks. However, occasionally surgery is necessary.  RELATED COMPLICATIONS   Permanent nerve damage, including pain, numbness, tingle, or weakness.  Chronic back pain.  Risks of surgery: infection,  bleeding, nerve damage, or damage to surrounding tissues. TREATMENT Treatment initially involves resting from any activities that aggravate your symptoms. The use of ice and medication may help reduce pain and inflammation. The use of strengthening and stretching exercises may help reduce pain with activity. These exercises may be performed at home or with referral to a therapist. A therapist may recommend further treatments, such as transcutaneous electronic nerve stimulation (TENS) or ultrasound. Your caregiver may recommend corticosteroid injections to help reduce inflammation of the sciatic nerve. If symptoms persist despite non-surgical (conservative) treatment, then surgery may be recommended. MEDICATION  If pain medication is necessary, then nonsteroidal anti-inflammatory medications, such as aspirin and ibuprofen, or other minor pain relievers, such as acetaminophen, are often recommended.  Do not take pain medication for 7 days before surgery.  Prescription pain relievers may be given if deemed necessary by your caregiver. Use only as directed and only as much as you need.  Ointments applied to the skin may be helpful.  Corticosteroid injections may be given by your caregiver. These injections should be reserved for the most serious cases, because they may only be given a certain number of times. HEAT AND COLD  Cold treatment (icing) relieves pain and reduces inflammation. Cold treatment should be applied for 10 to 15 minutes every 2 to 3 hours for inflammation and pain and immediately after any activity that aggravates your symptoms. Use ice packs or massage the area with a piece of ice (ice massage).  Heat treatment may be used prior to performing the stretching and strengthening activities prescribed by your caregiver, physical therapist, or athletic trainer. Use a heat pack or soak the injury in warm water.   SEEK MEDICAL CARE IF:  Treatment seems to offer no benefit, or the condition  worsens.  Any medications produce adverse side effects. EXERCISES  RANGE OF MOTION (ROM) AND STRETCHING EXERCISES - Sciatica Most people with sciatic will find that their symptoms worsen with either excessive bending forward (flexion) or arching at the low back (extension). The exercises which will help resolve your symptoms will focus on the opposite motion. Your physician, physical therapist or athletic trainer will help you determine which exercises will be most helpful to resolve your low back pain. Do not complete any exercises without first consulting with your clinician. Discontinue any exercises which worsen your symptoms until you speak to your clinician. If you have pain, numbness or tingling which travels down into your buttocks, leg or foot, the goal of the therapy is for these symptoms to move closer to your back and eventually resolve. Occasionally, these leg symptoms will get better, but your low back pain may worsen; this is typically an indication of progress in your rehabilitation. Be certain to be very alert to any changes in your symptoms and the activities in which you participated in the 24 hours prior to the change. Sharing this information with your clinician will allow him/her to most efficiently treat your condition. These exercises may help you when beginning to rehabilitate your injury. Your symptoms may resolve with or without further involvement from your physician, physical therapist or athletic trainer. While completing these exercises, remember:   Restoring tissue flexibility helps normal motion to return to the joints. This allows healthier, less painful movement and activity.  An effective stretch should be held for at least 30 seconds.  A stretch should never be painful. You should only feel a gentle lengthening or release in the stretched tissue. FLEXION RANGE OF MOTION AND STRETCHING EXERCISES: STRETCH - Flexion, Single Knee to Chest   Lie on a firm bed or floor  with both legs extended in front of you.  Keeping one leg in contact with the floor, bring your opposite knee to your chest. Hold your leg in place by either grabbing behind your thigh or at your knee.  Pull until you feel a gentle stretch in your low back. Hold __________ seconds.  Slowly release your grasp and repeat the exercise with the opposite side. Repeat __________ times. Complete this exercise __________ times per day.  STRETCH - Flexion, Double Knee to Chest  Lie on a firm bed or floor with both legs extended in front of you.  Keeping one leg in contact with the floor, bring your opposite knee to your chest.  Tense your stomach muscles to support your back and then lift your other knee to your chest. Hold your legs in place by either grabbing behind your thighs or at your knees.  Pull both knees toward your chest until you feel a gentle stretch in your low back. Hold __________ seconds.  Tense your stomach muscles and slowly return one leg at a time to the floor. Repeat __________ times. Complete this exercise __________ times per day.  STRETCH - Low Trunk Rotation   Lie on a firm bed or floor. Keeping your legs in front of you, bend your knees so they are both pointed toward the ceiling and your feet are flat on the floor.  Extend your arms out to the side. This will stabilize your upper body by keeping your shoulders in contact with the floor.  Gently and slowly drop both knees together to one side until   you feel a gentle stretch in your low back. Hold for __________ seconds.  Tense your stomach muscles to support your low back as you bring your knees back to the starting position. Repeat the exercise to the other side. Repeat __________ times. Complete this exercise __________ times per day  EXTENSION RANGE OF MOTION AND FLEXIBILITY EXERCISES: STRETCH - Extension, Prone on Elbows  Lie on your stomach on the floor, a bed will be too soft. Place your palms about shoulder  width apart and at the height of your head.  Place your elbows under your shoulders. If this is too painful, stack pillows under your chest.  Allow your body to relax so that your hips drop lower and make contact more completely with the floor.  Hold this position for __________ seconds.  Slowly return to lying flat on the floor. Repeat __________ times. Complete this exercise __________ times per day.  RANGE OF MOTION - Extension, Prone Press Ups  Lie on your stomach on the floor, a bed will be too soft. Place your palms about shoulder width apart and at the height of your head.  Keeping your back as relaxed as possible, slowly straighten your elbows while keeping your hips on the floor. You may adjust the placement of your hands to maximize your comfort. As you gain motion, your hands will come more underneath your shoulders.  Hold this position __________ seconds.  Slowly return to lying flat on the floor. Repeat __________ times. Complete this exercise __________ times per day.  STRENGTHENING EXERCISES - Sciatica  These exercises may help you when beginning to rehabilitate your injury. These exercises should be done near your "sweet spot." This is the neutral, low-back arch, somewhere between fully rounded and fully arched, that is your least painful position. When performed in this safe range of motion, these exercises can be used for people who have either a flexion or extension based injury. These exercises may resolve your symptoms with or without further involvement from your physician, physical therapist or athletic trainer. While completing these exercises, remember:   Muscles can gain both the endurance and the strength needed for everyday activities through controlled exercises.  Complete these exercises as instructed by your physician, physical therapist or athletic trainer. Progress with the resistance and repetition exercises only as your caregiver advises.  You may  experience muscle soreness or fatigue, but the pain or discomfort you are trying to eliminate should never worsen during these exercises. If this pain does worsen, stop and make certain you are following the directions exactly. If the pain is still present after adjustments, discontinue the exercise until you can discuss the trouble with your clinician. STRENGTHENING - Deep Abdominals, Pelvic Tilt   Lie on a firm bed or floor. Keeping your legs in front of you, bend your knees so they are both pointed toward the ceiling and your feet are flat on the floor.  Tense your lower abdominal muscles to press your low back into the floor. This motion will rotate your pelvis so that your tail bone is scooping upwards rather than pointing at your feet or into the floor.  With a gentle tension and even breathing, hold this position for __________ seconds. Repeat __________ times. Complete this exercise __________ times per day.  STRENGTHENING - Abdominals, Crunches   Lie on a firm bed or floor. Keeping your legs in front of you, bend your knees so they are both pointed toward the ceiling and your feet are flat on the   floor. Cross your arms over your chest.  Slightly tip your chin down without bending your neck.  Tense your abdominals and slowly lift your trunk high enough to just clear your shoulder blades. Lifting higher can put excessive stress on the low back and does not further strengthen your abdominal muscles.  Control your return to the starting position. Repeat __________ times. Complete this exercise __________ times per day.  STRENGTHENING - Quadruped, Opposite UE/LE Lift  Assume a hands and knees position on a firm surface. Keep your hands under your shoulders and your knees under your hips. You may place padding under your knees for comfort.  Find your neutral spine and gently tense your abdominal muscles so that you can maintain this position. Your shoulders and hips should form a rectangle  that is parallel with the floor and is not twisted.  Keeping your trunk steady, lift your right hand no higher than your shoulder and then your left leg no higher than your hip. Make sure you are not holding your breath. Hold this position __________ seconds.  Continuing to keep your abdominal muscles tense and your back steady, slowly return to your starting position. Repeat with the opposite arm and leg. Repeat __________ times. Complete this exercise __________ times per day.  STRENGTHENING - Abdominals and Quadriceps, Straight Leg Raise   Lie on a firm bed or floor with both legs extended in front of you.  Keeping one leg in contact with the floor, bend the other knee so that your foot can rest flat on the floor.  Find your neutral spine, and tense your abdominal muscles to maintain your spinal position throughout the exercise.  Slowly lift your straight leg off the floor about 6 inches for a count of 15, making sure to not hold your breath.  Still keeping your neutral spine, slowly lower your leg all the way to the floor. Repeat this exercise with each leg __________ times. Complete this exercise __________ times per day. POSTURE AND BODY MECHANICS CONSIDERATIONS - Sciatica Keeping correct posture when sitting, standing or completing your activities will reduce the stress put on different body tissues, allowing injured tissues a chance to heal and limiting painful experiences. The following are general guidelines for improved posture. Your physician or physical therapist will provide you with any instructions specific to your needs. While reading these guidelines, remember:  The exercises prescribed by your provider will help you have the flexibility and strength to maintain correct postures.  The correct posture provides the optimal environment for your joints to work. All of your joints have less wear and tear when properly supported by a spine with good posture. This means you will  experience a healthier, less painful body.  Correct posture must be practiced with all of your activities, especially prolonged sitting and standing. Correct posture is as important when doing repetitive low-stress activities (typing) as it is when doing a single heavy-load activity (lifting). RESTING POSITIONS Consider which positions are most painful for you when choosing a resting position. If you have pain with flexion-based activities (sitting, bending, stooping, squatting), choose a position that allows you to rest in a less flexed posture. You would want to avoid curling into a fetal position on your side. If your pain worsens with extension-based activities (prolonged standing, working overhead), avoid resting in an extended position such as sleeping on your stomach. Most people will find more comfort when they rest with their spine in a more neutral position, neither too rounded nor too   arched. Lying on a non-sagging bed on your side with a pillow between your knees, or on your back with a pillow under your knees will often provide some relief. Keep in mind, being in any one position for a prolonged period of time, no matter how correct your posture, can still lead to stiffness. PROPER SITTING POSTURE In order to minimize stress and discomfort on your spine, you must sit with correct posture Sitting with good posture should be effortless for a healthy body. Returning to good posture is a gradual process. Many people can work toward this most comfortably by using various supports until they have the flexibility and strength to maintain this posture on their own. When sitting with proper posture, your ears will fall over your shoulders and your shoulders will fall over your hips. You should use the back of the chair to support your upper back. Your low back will be in a neutral position, just slightly arched. You may place a small pillow or folded towel at the base of your low back for support.  When  working at a desk, create an environment that supports good, upright posture. Without extra support, muscles fatigue and lead to excessive strain on joints and other tissues. Keep these recommendations in mind: CHAIR:   A chair should be able to slide under your desk when your back makes contact with the back of the chair. This allows you to work closely.  The chair's height should allow your eyes to be level with the upper part of your monitor and your hands to be slightly lower than your elbows. BODY POSITION  Your feet should make contact with the floor. If this is not possible, use a foot rest.  Keep your ears over your shoulders. This will reduce stress on your neck and low back. INCORRECT SITTING POSTURES   If you are feeling tired and unable to assume a healthy sitting posture, do not slouch or slump. This puts excessive strain on your back tissues, causing more damage and pain. Healthier options include:  Using more support, like a lumbar pillow.  Switching tasks to something that requires you to be upright or walking.  Talking a brief walk.  Lying down to rest in a neutral-spine position. PROLONGED STANDING WHILE SLIGHTLY LEANING FORWARD  When completing a task that requires you to lean forward while standing in one place for a long time, place either foot up on a stationary 2-4 inch high object to help maintain the best posture. When both feet are on the ground, the low back tends to lose its slight inward curve. If this curve flattens (or becomes too large), then the back and your other joints will experience too much stress, fatigue more quickly and can cause pain.  CORRECT STANDING POSTURES Proper standing posture should be assumed with all daily activities, even if they only take a few moments, like when brushing your teeth. As in sitting, your ears should fall over your shoulders and your shoulders should fall over your hips. You should keep a slight tension in your abdominal  muscles to brace your spine. Your tailbone should point down to the ground, not behind your body, resulting in an over-extended swayback posture.  INCORRECT STANDING POSTURES  Common incorrect standing postures include a forward head, locked knees and/or an excessive swayback. WALKING Walk with an upright posture. Your ears, shoulders and hips should all line-up. PROLONGED ACTIVITY IN A FLEXED POSITION When completing a task that requires you to bend forward   at your waist or lean over a low surface, try to find a way to stabilize 3 of 4 of your limbs. You can place a hand or elbow on your thigh or rest a knee on the surface you are reaching across. This will provide you more stability so that your muscles do not fatigue as quickly. By keeping your knees relaxed, or slightly bent, you will also reduce stress across your low back. CORRECT LIFTING TECHNIQUES DO :   Assume a wide stance. This will provide you more stability and the opportunity to get as close as possible to the object which you are lifting.  Tense your abdominals to brace your spine; then bend at the knees and hips. Keeping your back locked in a neutral-spine position, lift using your leg muscles. Lift with your legs, keeping your back straight.  Test the weight of unknown objects before attempting to lift them.  Try to keep your elbows locked down at your sides in order get the best strength from your shoulders when carrying an object.  Always ask for help when lifting heavy or awkward objects. INCORRECT LIFTING TECHNIQUES DO NOT:   Lock your knees when lifting, even if it is a small object.  Bend and twist. Pivot at your feet or move your feet when needing to change directions.  Assume that you cannot safely pick up a paperclip without proper posture.   This information is not intended to replace advice given to you by your health care provider. Make sure you discuss any questions you have with your health care provider.     Document Released: 10/20/2005 Document Revised: 03/06/2015 Document Reviewed: 02/01/2009 [employer] Interactive Patient Education 2016 [employer]  

## 2016-08-25 NOTE — Progress Notes (Signed)
   Subjective:    Patient ID: Charlotte Aguirre, female    DOB: Apr 26, 1946, 70 y.o.   MRN: [id number]  Leg Pain   Associated symptoms include numbness and tingling.  Back Pain  This is a new problem. The current episode started in the past 7 days. The problem occurs constantly. The problem has been gradually worsening since onset. The pain is present in the gluteal. The quality of the pain is described as aching. The pain is at a severity of 10/10. The pain is severe. The symptoms are aggravated by bending and standing. Associated symptoms include leg pain, numbness and tingling. Pertinent negatives include no bladder incontinence, bowel incontinence or dysuria. Risk factors include sedentary lifestyle and obesity. She has tried bed rest and NSAIDs for the symptoms. The treatment provided mild relief.      Review of Systems  Gastrointestinal: Negative for bowel incontinence.  Genitourinary: Negative for bladder incontinence and dysuria.  Musculoskeletal: Positive for back pain.  Neurological: Positive for tingling and numbness.  All other systems reviewed and are negative.      Objective:   Physical Exam  Constitutional: She is oriented to person, place, and time. She appears well-developed and well-nourished. No distress.  Cardiovascular: Normal rate, regular rhythm, normal heart sounds and intact distal pulses.   No murmur heard. Pulmonary/Chest: Effort normal and breath sounds normal. No respiratory distress. She has no wheezes.  Abdominal: Soft. Bowel sounds are normal. She exhibits no distension. There is no tenderness.  Musculoskeletal: She exhibits no edema or tenderness.  Pain in right leg with standing and twisting radiating into groin   Neurological: She is alert and oriented to person, place, and time.  Skin: Skin is warm and dry.  Psychiatric: She has a normal mood and affect. Her behavior is normal. Judgment and thought content normal.  Vitals reviewed.   BP 121/71   Pulse  69   Temp 98 F (36.7 C) (Oral)   Ht 5\' 2"  (1.575 m)   Wt 183 lb 9.6 oz (83.3 kg)   BMI 33.58 kg/m        Assessment & Plan:  1. Hip pain, right - naproxen (NAPROSYN) 500 MG tablet; Take 1 tablet (500 mg total) by mouth 2 (two) times daily with a meal.  Dispense: 60 tablet; Refill: 1 - cyclobenzaprine (FLEXERIL) 5 MG tablet; Take 1 tablet (5 mg total) by mouth 3 (three) times daily as needed for muscle spasms.  Dispense: 60 tablet; Refill: 1  2. Sciatica of right side -Rest -ICe and heat -ROM exercises discussed -RTO Prn  - naproxen (NAPROSYN) 500 MG tablet; Take 1 tablet (500 mg total) by mouth 2 (two) times daily with a meal.  Dispense: 60 tablet; Refill: 1 - cyclobenzaprine (FLEXERIL) 5 MG tablet; Take 1 tablet (5 mg total) by mouth 3 (three) times daily as needed for muscle spasms.  Dispense: 60 tablet; Refill: 1 - predniSONE (STERAPRED UNI-PAK 21 TAB) 10 MG (21) TBPK tablet; Use as directed  Dispense: 21 tablet; Refill: 0  Evelina Dun, FNP

## 2016-08-26 ENCOUNTER — Ambulatory Visit (INDEPENDENT_AMBULATORY_CARE_PROVIDER_SITE_OTHER): Payer: Medicare Other | Admitting: Internal Medicine

## 2016-09-04 ENCOUNTER — Ambulatory Visit (INDEPENDENT_AMBULATORY_CARE_PROVIDER_SITE_OTHER): Payer: Medicare HMO | Admitting: Family

## 2016-09-04 ENCOUNTER — Encounter: Payer: Self-pay | Admitting: Family

## 2016-09-04 VITALS — BP 113/64 | HR 75 | Temp 98.0°F | Ht 62.0 in | Wt 183.0 lb

## 2016-09-04 DIAGNOSIS — M5431 Sciatica, right side: Secondary | ICD-10-CM

## 2016-09-04 DIAGNOSIS — M5441 Lumbago with sciatica, right side: Secondary | ICD-10-CM

## 2016-09-04 MED ORDER — METHYLPREDNISOLONE ACETATE 80 MG/ML IJ SUSP
80.0000 mg | Freq: Once | INTRAMUSCULAR | Status: AC
Start: 2016-09-04 — End: 2016-09-04
  Administered 2016-09-04: 80 mg via INTRAMUSCULAR

## 2016-09-04 MED ORDER — KETOROLAC TROMETHAMINE 60 MG/2ML IM SOLN
30.0000 mg | Freq: Once | INTRAMUSCULAR | Status: AC
  Administered 2016-09-04: 30 mg via INTRAMUSCULAR

## 2016-09-04 NOTE — Progress Notes (Signed)
   Subjective:    Patient ID: Charlotte Aguirre, female    DOB: 12/18/1945, 70 y.o.   MRN: [id number]  PT presents to the office for recurrent  Right Sciatic pain.  Back Pain  This is a recurrent problem. The current episode started 1 to 4 weeks ago. The problem occurs constantly. The problem has been waxing and waning since onset. The pain is present in the gluteal. The quality of the pain is described as burning. The pain radiates to the right thigh and right knee. The pain is at a severity of 5/10. The pain is moderate. The pain is worse during the night. The symptoms are aggravated by bending and standing. Associated symptoms include leg pain, numbness, tingling and weakness. Pertinent negatives include no bladder incontinence, bowel incontinence or dysuria. She has tried bed rest, ice, muscle relaxant and NSAIDs for the symptoms. The treatment provided mild relief.  Leg Pain   Associated symptoms include numbness and tingling.      Review of Systems  Gastrointestinal: Negative for bowel incontinence.  Genitourinary: Negative for bladder incontinence and dysuria.  Musculoskeletal: Positive for back pain.  Neurological: Positive for tingling, weakness and numbness.  All other systems reviewed and are negative.      Objective:   Physical Exam  Constitutional: She is oriented to person, place, and time. She appears well-developed and well-nourished. No distress.  HENT:  Head: Normocephalic.  Cardiovascular: Normal rate, regular rhythm, normal heart sounds and intact distal pulses.   No murmur heard. Pulmonary/Chest: Effort normal and breath sounds normal. No respiratory distress. She has no wheezes.  Abdominal: Soft. Bowel sounds are normal. She exhibits no distension. There is no tenderness.  Musculoskeletal: Normal range of motion. She exhibits tenderness. She exhibits no edema.  Pain in right gluteal area extending into right knee  Neurological: She is alert and oriented to person,  place, and time.  Skin: Skin is warm and dry.  Psychiatric: She has a normal mood and affect. Her behavior is normal. Judgment and thought content normal.  Vitals reviewed.     BP 113/64   Pulse 75   Temp 98 F (36.7 C) (Oral)   Ht 5\' 2"  (1.575 m)   Wt 183 lb (83 kg)   BMI 33.47 kg/m      Assessment & Plan:  1. Acute right-sided low back pain with right-sided sciatica - ketorolac (TORADOL) injection 30 mg; Inject 1 mL (30 mg total) into the muscle once. - methylPREDNISolone acetate (DEPO-MEDROL) injection 80 mg; Inject 1 mL (80 mg total) into the muscle once. - Ambulatory referral to Physical Therapy  2. Sciatica of right side - ketorolac (TORADOL) injection 30 mg; Inject 1 mL (30 mg total) into the muscle once. - methylPREDNISolone acetate (DEPO-MEDROL) injection 80 mg; Inject 1 mL (80 mg total) into the muscle once. - Ambulatory referral to Physical Therapy  Continue ROM exercses and stretches Ice and heat Naprosyn and flexeril prn Referral to PT RTO prn   Evelina Dun, FNP

## 2016-09-04 NOTE — Patient Instructions (Signed)

## 2016-09-16 ENCOUNTER — Encounter: Payer: Self-pay | Admitting: Family

## 2016-09-16 ENCOUNTER — Ambulatory Visit (INDEPENDENT_AMBULATORY_CARE_PROVIDER_SITE_OTHER): Payer: Medicare HMO | Admitting: Family

## 2016-09-16 VITALS — BP 139/73 | HR 80 | Temp 97.5°F | Ht 62.0 in | Wt 182.2 lb

## 2016-09-16 DIAGNOSIS — J301 Allergic rhinitis due to pollen: Secondary | ICD-10-CM

## 2016-09-16 DIAGNOSIS — J029 Acute pharyngitis, unspecified: Secondary | ICD-10-CM

## 2016-09-16 MED ORDER — FLUTICASONE PROPIONATE 50 MCG/ACT NA SUSP: 2.0000 | Freq: Every day | NASAL | 6 refills | Status: DC

## 2016-09-16 MED ORDER — CETIRIZINE HCL 10 MG PO TABS: 10.0000 mg | ORAL_TABLET | Freq: Every day | ORAL | 11 refills | Status: DC

## 2016-09-16 NOTE — Patient Instructions (Signed)
Allergic Rhinitis Allergic rhinitis is when the mucous membranes in the nose respond to allergens. Allergens are particles in the air that cause your body to have an allergic reaction. This causes you to release allergic antibodies. Through a chain of events, these eventually cause you to release histamine into the blood stream. Although meant to protect the body, it is this release of histamine that causes your discomfort, such as frequent sneezing, congestion, and an itchy, runny nose. What are the causes? Seasonal allergic rhinitis (hay fever) is caused by pollen allergens that may come from grasses, trees, and weeds. Year-round allergic rhinitis (perennial allergic rhinitis) is caused by allergens such as house dust mites, pet dander, and mold spores. What are the signs or symptoms?  Nasal stuffiness (congestion).  Itchy, runny nose with sneezing and tearing of the eyes. How is this diagnosed? Your health care provider can help you determine the allergen or allergens that trigger your symptoms. If you and your health care provider are unable to determine the allergen, skin or blood testing may be used. Your health care provider will diagnose your condition after taking your health history and performing a physical exam. Your health care provider may assess you for other related conditions, such as asthma, pink eye, or an ear infection. How is this treated? Allergic rhinitis does not have a cure, but it can be controlled by:  Medicines that block allergy symptoms. These may include allergy shots, nasal sprays, and oral antihistamines.  Avoiding the allergen. Hay fever may often be treated with antihistamines in pill or nasal spray forms. Antihistamines block the effects of histamine. There are over-the-counter medicines that may help with nasal congestion and swelling around the eyes. Check with your health care provider before taking or giving this medicine. If avoiding the allergen or the  medicine prescribed do not work, there are many new medicines your health care provider can prescribe. Stronger medicine may be used if initial measures are ineffective. Desensitizing injections can be used if medicine and avoidance does not work. Desensitization is when a patient is given ongoing shots until the body becomes less sensitive to the allergen. Make sure you follow up with your health care provider if problems continue. Follow these instructions at home: It is not possible to completely avoid allergens, but you can reduce your symptoms by taking steps to limit your exposure to them. It helps to know exactly what you are allergic to so that you can avoid your specific triggers. Contact a health care provider if:  You have a fever.  You develop a cough that does not stop easily (persistent).  You have shortness of breath.  You start wheezing.  Symptoms interfere with normal daily activities. This information is not intended to replace advice given to you by your health care provider. Make sure you discuss any questions you have with your health care provider. Document Released: 07/15/2001 Document Revised: 06/20/2016 Document Reviewed: 06/27/2013 Elsevier Interactive Patient Education  2017 [employer]  

## 2016-09-16 NOTE — Progress Notes (Signed)
   Subjective:    Patient ID: Charlotte Aguirre, female    DOB: 01-Mar-1946, 70 y.o.   MRN: [id number]  HPI Pt presents to the office today with right neck pain. PT states this started a few months ago and comes and goes and has hoarseness that comes and goes. Pt states her son was diagnosed with throat cancer last month and is worried she could have throat cancer. PT denies any sore throat or difficulty swallowing. PT states she smoked 30 years, but quit 40 years ago.    Review of Systems  HENT:       Neck pain        Objective:   Physical Exam  Constitutional: She is oriented to person, place, and time. She appears well-developed and well-nourished. No distress.  HENT:  Head: Normocephalic and atraumatic.  Right Ear: External ear normal.  Left Ear: External ear normal.  Nose: Mucosal edema and rhinorrhea present.  Mouth/Throat: Posterior oropharyngeal erythema present.  Eyes: Pupils are equal, round, and reactive to light.  Neck: Normal range of motion. Neck supple. No thyromegaly present.  Cardiovascular: Normal rate, regular rhythm, normal heart sounds and intact distal pulses.   No murmur heard. Pulmonary/Chest: Effort normal and breath sounds normal. No respiratory distress. She has no wheezes.  Abdominal: Soft. Bowel sounds are normal. She exhibits no distension. There is no tenderness.  Musculoskeletal: Normal range of motion. She exhibits no edema or tenderness.  Neurological: She is alert and oriented to person, place, and time. She has normal reflexes. No cranial nerve deficit.  Skin: Skin is warm and dry.  Psychiatric: She has a normal mood and affect. Her behavior is normal. Judgment and thought content normal.  Vitals reviewed.     BP 139/73   Pulse 80   Temp 97.5 F (36.4 C) (Oral)   Ht 5\' 2"  (1.575 m)   Wt 182 lb 3.2 oz (82.6 kg)   BMI 33.32 kg/m      Assessment & Plan:  1. Sore throat - fluticasone (FLONASE) 50 MCG/ACT nasal spray; Place 2 sprays into both  nostrils daily.  Dispense: 16 g; Refill: 6 - cetirizine (ZYRTEC) 10 MG tablet; Take 1 tablet (10 mg total) by mouth daily.  Dispense: 30 tablet; Refill: 11 - CBC with Differential/Platelet  2. Chronic allergic rhinitis due to pollen, unspecified seasonality - fluticasone (FLONASE) 50 MCG/ACT nasal spray; Place 2 sprays into both nostrils daily.  Dispense: 16 g; Refill: 6 - cetirizine (ZYRTEC) 10 MG tablet; Take 1 tablet (10 mg total) by mouth daily.  Dispense: 30 tablet; Refill: 11 - CBC with Differential/Platelet  Avoid allergens  I believe this neck pain is related allergies- nasal erythemas swelling, postnasal drip present Flonase and Zyrtec daily Force fluids RTO prn  Evelina Dun, FNP

## 2016-09-17 LAB — CBC WITH DIFFERENTIAL/PLATELET
Basophils Absolute: 0 10*3/uL (ref 0.0–0.2)
Basos: 0 %
EOS (ABSOLUTE): 0.2 10*3/uL (ref 0.0–0.4)
Eos: 2 %
Hematocrit: 43.3 % (ref 34.0–46.6)
Hemoglobin: 14.6 g/dL (ref 11.1–15.9)
Immature Grans (Abs): 0 10*3/uL (ref 0.0–0.1)
Immature Granulocytes: 0 %
Lymphocytes Absolute: 2.7 10*3/uL (ref 0.7–3.1)
Lymphs: 28 %
MCH: 27.8 pg (ref 26.6–33.0)
MCHC: 33.7 g/dL (ref 31.5–35.7)
MCV: 82 fL (ref 79–97)
Monocytes Absolute: 0.5 10*3/uL (ref 0.1–0.9)
Monocytes: 5 %
Neutrophils Absolute: 6.3 10*3/uL (ref 1.4–7.0)
Neutrophils: 65 %
Platelets: 275 10*3/uL (ref 150–379)
RBC: 5.26 x10E6/uL (ref 3.77–5.28)
RDW: 14.2 % (ref 12.3–15.4)
WBC: 9.8 10*3/uL (ref 3.4–10.8)

## 2016-09-22 ENCOUNTER — Telehealth: Payer: Self-pay

## 2016-09-22 NOTE — Telephone Encounter (Signed)
Patient came in today and wanted to make you aware that she has been having some pain in her right forearm since she had blood drawn here on Friday.  She said as long as her arm is still it does not bother her but when she moves or twists her arm she has pain in it.  This morning she tried to pick up a 3 lb. Bag of dog food and experienced a sharp pain in the arm.  She said while she was having the blood drawn she began experiencing a burning sensation and told the lab tech about this. I recommended to the patient that she needed to be evaluated and so patient wanted to see you tomorrow when you come back, appointment made 09/23/16 at 2:40 pm.

## 2016-09-23 ENCOUNTER — Ambulatory Visit (INDEPENDENT_AMBULATORY_CARE_PROVIDER_SITE_OTHER): Payer: Medicare HMO | Admitting: Family

## 2016-09-23 ENCOUNTER — Encounter: Payer: Self-pay | Admitting: Family

## 2016-09-23 VITALS — BP 117/65 | HR 71 | Temp 97.3°F | Ht 62.0 in | Wt 183.4 lb

## 2016-09-23 DIAGNOSIS — M79601 Pain in right arm: Secondary | ICD-10-CM

## 2016-09-23 MED ORDER — NAPROXEN 500 MG PO TABS: 500.0000 mg | ORAL_TABLET | Freq: Two times a day (BID) | ORAL | 1 refills | Status: DC

## 2016-09-23 NOTE — Patient Instructions (Signed)
Tendinitis Tendinitis is inflammation of a tendon. A tendon is a strong cord of tissue that connects muscle to bone. Tendinitis can affect any tendon, but it most commonly affects the shoulder tendon (rotator cuff), ankle tendon (Achilles tendon), elbow tendon (triceps tendon), or one of the tendons in the wrist. What are the causes? This condition may be caused by:  Overusing a tendon or muscle. This is common.  Age-related wear and tear.  Injury.  Inflammatory conditions, such as arthritis.  Certain medicines. What increases the risk? This condition is more likely to develop in people who do activities that involve repetitive motions. What are the signs or symptoms? Symptoms of this condition may include:  Pain.  Tenderness.  Mild swelling. How is this diagnosed? This condition is diagnosed with a physical exam. You may also have tests, such as:  Ultrasound. This uses sound waves to make an image of your affected area.  MRI. How is this treated? This condition may be treated by resting, icing, applying pressure (compression), and raising (elevating) the area above the level of your heart. This is known as RICE therapy. Treatment may also include:  Medicines to help reduce inflammation or to help reduce pain.  Exercises or physical therapy to strengthen and stretch the tendon.  A brace or splint.  Surgery (rare). Follow these instructions at home:   If you have a splint or brace:   Wear the splint or brace as told by your health care provider. Remove it only as told by your health care provider.  Loosen the splint or brace if your fingers or toes tingle, become numb, or turn cold and blue.  Do not take baths, swim, or use a hot tub until your health care provider approves. Ask your health care provider if you can take showers. You may only be allowed to take sponge baths for bathing.  Do not let your splint or brace get wet if it is not waterproof.  If your splint  or brace is not waterproof, cover it with a watertight plastic bag when you take a bath or a shower.  Keep the splint or brace clean. Managing pain, stiffness, and swelling   If directed, apply ice to the affected area.  Put ice in a plastic bag.  Place a towel between your skin and the bag.  Leave the ice on for 20 minutes, 2-3 times a day.  If directed, apply heat to the affected area as often as told by your health care provider. Use the heat source that your health care provider recommends, such as a moist heat pack or a heating pad.  Place a towel between your skin and the heat source.  Leave the heat on for 20-30 minutes.  Remove the heat if your skin turns bright red. This is especially important if you are unable to feel pain, heat, or cold. You may have a greater risk of getting burned.  Move the fingers or toes of the affected limb often, if this applies. This can help to prevent stiffness and lessen swelling.  If directed, elevate the affected area above the level of your heart while you are sitting or lying down. Driving   Do not drive or operate heavy machinery while taking prescription pain medicine.  Ask your health care provider when it is safe to drive if you have a splint or brace on any part of your arm or leg. Activity   Return to your normal activities as told by your health   care provider. Ask your health care provider what activities are safe for you.  Rest the affected area as told by your health care provider.  Avoid using the affected area while you are experiencing symptoms of tendinitis.  Do exercises as told by your health care provider. General instructions   If you have a splint, do not put pressure on any part of the splint until it is fully hardened. This may take several hours.  Wear an elastic bandage or compression wrap only as told by your health care provider.  Take over-the-counter and prescription medicines only as told by your health  care provider.  Keep all follow-up visits as told by your health care provider. This is important. Contact a health care provider if:  Your symptoms do not improve.  You develop new, unexplained problems, such as numbness in your hands. This information is not intended to replace advice given to you by your health care provider. Make sure you discuss any questions you have with your health care provider. Document Released: 10/17/2000 Document Revised: 06/19/2016 Document Reviewed: 07/23/2015 Elsevier Interactive Patient Education  2017 [employer]  

## 2016-09-23 NOTE — Progress Notes (Signed)
   Subjective:    Patient ID: Charlotte Aguirre, female    DOB: 12-Jun-1946, 70 y.o.   MRN: [id number]  Pt reports intermittent shooting pain in her right AC after drawing blood in our office last week. Pt states while getting blood she had a sharp, burning pain and since then she has had intermittent shooting pain that gets worse when she uses her right arm. Arm Pain   The incident occurred more than 1 week ago. Incident location: after lab work. There was no injury mechanism. Pain location: right AC that shoots down her right arm. The quality of the pain is described as shooting. The pain radiates to the right arm. The pain is mild. The pain has been intermittent since the incident. Associated symptoms include muscle weakness. The symptoms are aggravated by lifting. She has tried rest for the symptoms. The treatment provided mild relief.      Review of Systems  All other systems reviewed and are negative.      Objective:   Physical Exam  Constitutional: She is oriented to person, place, and time. She appears well-developed and well-nourished. No distress.  HENT:  Head: Normocephalic and atraumatic.  Eyes: Pupils are equal, round, and reactive to light.  Neck: Normal range of motion. Neck supple. No thyromegaly present.  Cardiovascular: Normal rate, regular rhythm, normal heart sounds and intact distal pulses.   No murmur heard. Pulmonary/Chest: Effort normal and breath sounds normal. No respiratory distress. She has no wheezes.  Abdominal: Soft. Bowel sounds are normal. She exhibits no distension. There is no tenderness.  Musculoskeletal: Normal range of motion. She exhibits tenderness. She exhibits no edema.  No tenderness with palpation, but states she has tenderness in right AC that extends down her arm when she extendion or flexion   Neurological: She is alert and oriented to person, place, and time.  Skin: Skin is warm and dry.  Psychiatric: She has a normal mood and affect. Her  behavior is normal. Judgment and thought content normal.  Vitals reviewed.     BP 117/65   Pulse 71   Temp 97.3 F (36.3 C) (Oral)   Ht 5\' 2"  (1.575 m)   Wt 183 lb 6.4 oz (83.2 kg)   BMI 33.54 kg/m      Assessment & Plan:  1. Right arm pain -Rest -Ice -Naproxen BID for next 5-7 days with food -RTo prn  - naproxen (NAPROSYN) 500 MG tablet; Take 1 tablet (500 mg total) by mouth 2 (two) times daily with a meal.  Dispense: 60 tablet; Refill: [place], FNP

## 2016-10-02 ENCOUNTER — Encounter: Payer: Self-pay | Admitting: Physician Assistant

## 2016-10-02 ENCOUNTER — Ambulatory Visit (INDEPENDENT_AMBULATORY_CARE_PROVIDER_SITE_OTHER): Payer: Medicare HMO | Admitting: Physician Assistant

## 2016-10-02 VITALS — BP 112/62 | HR 70 | Temp 97.8°F | Ht 62.0 in | Wt 182.0 lb

## 2016-10-02 DIAGNOSIS — H6982 Other specified disorders of Eustachian tube, left ear: Secondary | ICD-10-CM

## 2016-10-02 DIAGNOSIS — J0100 Acute maxillary sinusitis, unspecified: Secondary | ICD-10-CM | POA: Diagnosis not present

## 2016-10-02 MED ORDER — AMOXICILLIN 500 MG PO CAPS: 1000.0000 mg | ORAL_CAPSULE | Freq: Two times a day (BID) | ORAL | 0 refills | Status: DC

## 2016-10-02 MED ORDER — METHYLPREDNISOLONE ACETATE 80 MG/ML IJ SUSP
80.0000 mg | Freq: Once | INTRAMUSCULAR | Status: AC
  Administered 2016-10-02: 80 mg via INTRAMUSCULAR

## 2016-10-02 NOTE — Patient Instructions (Signed)

## 2016-10-06 NOTE — Progress Notes (Signed)
BP 112/62 (BP Location: Left Arm, Patient Position: Sitting, Cuff Size: Normal)   Pulse 70   Temp 97.8 F (36.6 C) (Oral)   Ht 5\' 2"  (1.575 m)   Wt 182 lb (82.6 kg)   BMI 33.29 kg/m    Subjective:    Patient ID: Charlotte Aguirre, female    DOB: 1946-08-29, 70 y.o.   MRN: [id number]  HPI: Charlotte Aguirre is a 69 y.o. female presenting on 10/02/2016 for Nasal Congestion (x 1 week. Has taken OTC cough medicine, mucinex, and advil.); Otalgia (left ear pain); and Cough  Severe sinus pressure and drainage for greater than one week. Denies fever and chills. No NVD. Has sore throat and ear pressure with associated dizziness when she turns head.  OTC meds are not helping.  Relevant past medical, surgical, family and social history reviewed and updated as indicated. Allergies and medications reviewed and updated.  Past Medical History:  Diagnosis Date  . Arthritis    thumb and fingers  . Cataract   . Fibromyalgia   . GERD (gastroesophageal reflux disease)   . Headache(784.0)   . Hyperlipidemia   . Hypertension   . IBS (irritable bowel syndrome)   . Memory changes   . Osteopenia   . Peripheral neuropathy ([place])   . Ulcer ([place])    gastric  . Vitamin D insufficiency   . Wrist fracture 2013    Past Surgical History:  Procedure Laterality Date  . ABDOMINAL HYSTERECTOMY     uterus frist and 10 years later both ovaries removed  . CHOLECYSTECTOMY    . COLONOSCOPY N/A 03/22/2014   Procedure: COLONOSCOPY;  Surgeon: Rogene Houston, MD;  [facility]: [facility];  Service: Endoscopy;  Laterality: N/A;  930  . OOPHORECTOMY      Review of Systems  Constitutional: Negative.  Negative for chills, fatigue and fever.  HENT: Positive for congestion, postnasal drip, sinus pain, sinus pressure and sore throat.   Eyes: Negative.   Respiratory: Positive for cough and wheezing.   Gastrointestinal: Negative.   Genitourinary: Negative.       Medication List       Accurate as of 10/02/16 11:59 PM.  Always use your most recent med list.          amoxicillin 500 MG capsule Commonly known as:  AMOXIL Take 2 capsules (1,000 mg total) by mouth 2 (two) times daily.   aspirin 81 MG chewable tablet Chew by mouth daily.   calcium-vitamin D 500-200 MG-UNIT tablet Commonly known as:  OSCAL WITH D Take 1 tablet by mouth 2 (two) times daily.   fluticasone 50 MCG/ACT nasal spray Commonly known as:  FLONASE Place 2 sprays into both nostrils daily.   gabapentin 600 MG tablet Commonly known as:  NEURONTIN Take 2 tablets by mouth teach morning, three each evening   hydrochlorothiazide 25 MG tablet Commonly known as:  HYDRODIURIL TAKE 1 TABLET DAILY   multivitamins ther. w/minerals Tabs tablet Take 1 tablet by mouth daily.   naproxen 500 MG tablet Commonly known as:  NAPROSYN Take 1 tablet (500 mg total) by mouth 2 (two) times daily with a meal.   pantoprazole 40 MG tablet Commonly known as:  PROTONIX Take 1 tablet by mouth  daily   simvastatin 40 MG tablet Commonly known as:  ZOCOR Take 1 tablet by mouth  daily   VITAMIN D-3 PO Take 1 tablet by mouth 2 (two) times daily. 1000mg   Objective:    BP 112/62 (BP Location: Left Arm, Patient Position: Sitting, Cuff Size: Normal)   Pulse 70   Temp 97.8 F (36.6 C) (Oral)   Ht 5\' 2"  (1.575 m)   Wt 182 lb (82.6 kg)   BMI 33.29 kg/m   Allergies  Allergen Reactions  . Aspirin     Must take EC, prior history of stomach ulcer.  . Topiramate Other (See Comments)    hallucinations    Physical Exam  Constitutional: She is oriented to person, place, and time. She appears well-developed and well-nourished.  HENT:  Head: Normocephalic and atraumatic.  Right Ear: Tympanic membrane and external ear normal. No middle ear effusion.  Left Ear: Tympanic membrane and external ear normal.  No middle ear effusion.  Nose: Mucosal edema and rhinorrhea present. Right sinus exhibits no maxillary sinus tenderness. Left sinus  exhibits no maxillary sinus tenderness.  Mouth/Throat: Uvula is midline. Posterior oropharyngeal erythema present.  Eyes: Conjunctivae and EOM are normal. Pupils are equal, round, and reactive to light. Right eye exhibits no discharge. Left eye exhibits no discharge.  Neck: Normal range of motion.  Cardiovascular: Normal rate, regular rhythm and normal heart sounds.   Pulmonary/Chest: Effort normal and breath sounds normal. No respiratory distress. She has no wheezes.  Abdominal: Soft.  Lymphadenopathy:    She has no cervical adenopathy.  Neurological: She is alert and oriented to person, place, and time.  Skin: Skin is warm and dry.  Psychiatric: She has a normal mood and affect.        Assessment & Plan:   1. Eustachian tube dysfunction, left - methylPREDNISolone acetate (DEPO-MEDROL) injection 80 mg; Inject 1 mL (80 mg total) into the muscle once. - amoxicillin (AMOXIL) 500 MG capsule; Take 2 capsules (1,000 mg total) by mouth 2 (two) times daily.  Dispense: 40 capsule; Refill: 0  2. Acute non-recurrent maxillary sinusitis - methylPREDNISolone acetate (DEPO-MEDROL) injection 80 mg; Inject 1 mL (80 mg total) into the muscle once. - amoxicillin (AMOXIL) 500 MG capsule; Take 2 capsules (1,000 mg total) by mouth 2 (two) times daily.  Dispense: 40 capsule; Refill: 0   Continue all other maintenance medications as listed above.  Follow up plan: Return if symptoms worsen or fail to improve.  Educational handout given for serous otitis media  Terald Sleeper PA-C [facility] [address]  [place], [place] [address] [phone]   10/06/2016, 9:35 PM

## 2016-10-16 ENCOUNTER — Ambulatory Visit (INDEPENDENT_AMBULATORY_CARE_PROVIDER_SITE_OTHER): Payer: Medicare HMO | Admitting: Family

## 2016-10-16 ENCOUNTER — Encounter: Payer: Self-pay | Admitting: Family

## 2016-10-16 VITALS — BP 113/72 | HR 70 | Temp 97.6°F | Ht 62.0 in | Wt 180.4 lb

## 2016-10-16 DIAGNOSIS — I10 Essential (primary) hypertension: Secondary | ICD-10-CM

## 2016-10-16 DIAGNOSIS — K5901 Slow transit constipation: Secondary | ICD-10-CM | POA: Diagnosis not present

## 2016-10-16 DIAGNOSIS — E669 Obesity, unspecified: Secondary | ICD-10-CM | POA: Diagnosis not present

## 2016-10-16 DIAGNOSIS — E8881 Metabolic syndrome: Secondary | ICD-10-CM | POA: Diagnosis not present

## 2016-10-16 DIAGNOSIS — E785 Hyperlipidemia, unspecified: Secondary | ICD-10-CM

## 2016-10-16 DIAGNOSIS — M8580 Other specified disorders of bone density and structure, unspecified site: Secondary | ICD-10-CM

## 2016-10-16 DIAGNOSIS — J301 Allergic rhinitis due to pollen: Secondary | ICD-10-CM

## 2016-10-16 DIAGNOSIS — G609 Hereditary and idiopathic neuropathy, unspecified: Secondary | ICD-10-CM | POA: Diagnosis not present

## 2016-10-16 DIAGNOSIS — E559 Vitamin D deficiency, unspecified: Secondary | ICD-10-CM | POA: Diagnosis not present

## 2016-10-16 DIAGNOSIS — K219 Gastro-esophageal reflux disease without esophagitis: Secondary | ICD-10-CM | POA: Diagnosis not present

## 2016-10-16 DIAGNOSIS — M1990 Unspecified osteoarthritis, unspecified site: Secondary | ICD-10-CM

## 2016-10-16 NOTE — Patient Instructions (Signed)
Peripheral Neuropathy Peripheral neuropathy is a type of nerve damage. It affects nerves that carry signals between the spinal cord and other parts of the body. These are called peripheral nerves. With peripheral neuropathy, one nerve or a group of nerves may be damaged. What are the causes? Many things can damage peripheral nerves. For some people with peripheral neuropathy, the cause is unknown. Some causes include:  Diabetes. This is the most common cause of peripheral neuropathy.  Injury to a nerve.  Pressure or stress on a nerve that lasts a long time.  Too little vitamin B. Alcoholism can lead to this.  Infections.  Autoimmune diseases, such as multiple sclerosis and systemic lupus erythematosus.  Inherited nerve diseases.  Some medicines, such as cancer drugs.  Toxic substances, such as lead and mercury.  Too little blood flowing to the legs.  Kidney disease.  Thyroid disease.  What are the signs or symptoms? Different people have different symptoms. The symptoms you have will depend on which of your nerves is damaged. Common symptoms include:  Loss of feeling (numbness) in the feet and hands.  Tingling in the feet and hands.  Pain that burns.  Very sensitive skin.  Weakness.  Not being able to move a part of the body (paralysis).  Muscle twitching.  Clumsiness or poor coordination.  Loss of balance.  Not being able to control your bladder.  Feeling dizzy.  Sexual problems.  How is this diagnosed? Peripheral neuropathy is a symptom, not a disease. Finding the cause of peripheral neuropathy can be hard. To figure that out, your health care provider will take a medical history and do a physical exam. A neurological exam will also be done. This involves checking things affected by your brain, spinal cord, and nerves (nervous system). For example, your health care provider will check your reflexes, how you move, and what you can feel. Other types of tests  may also be ordered, such as:  Blood tests.  A test of the fluid in your spinal cord.  Imaging tests, such as CT scans or an MRI.  Electromyography (EMG). This test checks the nerves that control muscles.  Nerve conduction velocity tests. These tests check how fast messages pass through your nerves.  Nerve biopsy. A small piece of nerve is removed. It is then checked under a microscope.  How is this treated?  Medicine is often used to treat peripheral neuropathy. Medicines may include: ? Pain-relieving medicines. Prescription or over-the-counter medicine may be suggested. ? Antiseizure medicine. This may be used for pain. ? Antidepressants. These also may help ease pain from neuropathy. ? Lidocaine. This is a numbing medicine. You might wear a patch or be given a shot. ? Mexiletine. This medicine is typically used to help control irregular heart rhythms.  Surgery. Surgery may be needed to relieve pressure on a nerve or to destroy a nerve that is causing pain.  Physical therapy to help movement.  Assistive devices to help movement. Follow these instructions at home:  Only take over-the-counter or prescription medicines as directed by your health care provider. Follow the instructions carefully for any given medicines. Do not take any other medicines without first getting approval from your health care provider.  If you have diabetes, work closely with your health care provider to keep your blood sugar under control.  If you have numbness in your feet: ? Check every day for signs of injury or infection. Watch for redness, warmth, and swelling. ? Wear padded socks and comfortable   shoes. These help protect your feet.  Do not do things that put pressure on your damaged nerve.  Do not smoke. Smoking keeps blood from getting to damaged nerves.  Avoid or limit alcohol. Too much alcohol can cause a lack of B vitamins. These vitamins are needed for healthy nerves.  Develop a good  support system. Coping with peripheral neuropathy can be stressful. Talk to a mental health specialist or join a support group if you are struggling.  Follow up with your health care provider as directed. Contact a health care provider if:  You have new signs or symptoms of peripheral neuropathy.  You are struggling emotionally from dealing with peripheral neuropathy.  You have a fever. Get help right away if:  You have an injury or infection that is not healing.  You feel very dizzy or begin vomiting.  You have chest pain.  You have trouble breathing. This information is not intended to replace advice given to you by your health care provider. Make sure you discuss any questions you have with your health care provider. Document Released: 10/10/2002 Document Revised: 03/27/2016 Document Reviewed: 06/27/2013 Elsevier Interactive Patient Education  2017 [employer] Inc[employer]  

## 2016-10-16 NOTE — Progress Notes (Signed)
Subjective:    Patient ID: Charlotte Aguirre, female    DOB: [birth year], 70 y.o.   MRN: [id number]  Pt presents to the office today for chronic follow up. Pt had her rectocele repaired on 10/10/15. Pt states she is doing well.  Hyperlipidemia  This is a chronic problem. The current episode started more than 1 year ago. The problem is controlled. Recent lipid tests were reviewed and are normal. Exacerbating diseases include obesity. She has no history of diabetes. Pertinent negatives include no leg pain or shortness of breath. Current antihyperlipidemic treatment includes statins. The current treatment provides moderate improvement of lipids. Risk factors for coronary artery disease include dyslipidemia, family history, hypertension, obesity and post-menopausal.  Hypertension  This is a chronic problem. The current episode started more than 1 year ago. The problem has been resolved since onset. The problem is controlled. Associated symptoms include blurred vision. Pertinent negatives include no anxiety, headaches, malaise/fatigue, palpitations, peripheral edema or shortness of breath. Risk factors for coronary artery disease include dyslipidemia, obesity, post-menopausal state and sedentary lifestyle. Past treatments include diuretics. The current treatment provides significant improvement. There is no history of kidney disease, CAD/MI, CVA, heart failure or a thyroid problem. There is no history of sleep apnea.  Gastroesophageal Reflux  She reports no belching, no coughing, no heartburn, no nausea or no sore throat. The current episode started more than 1 year ago. The problem occurs rarely. The problem has been resolved. Risk factors include obesity. She has tried a PPI for the symptoms. The treatment provided moderate relief.  Constipation  This is a chronic problem. The current episode started more than 1 month ago. The problem has been resolved since onset. Her stool frequency is 1 time per day. The  patient is not on a high fiber diet. Pertinent negatives include no bloating, diarrhea, hematochezia or nausea. Risk factors include obesity. She has tried laxatives for the symptoms. The treatment provided significant relief.  Arthritis  Presents for follow-up visit. Affected locations include the left MCP (back). Her pain is at a severity of 0/10. Pertinent negatives include no diarrhea.  Peripheral Neuropathy PT currently taking Gabapentin 1200 mg in AM and 2477m in the evening. PT states this seems to help a little. PT some days are better than others.  Osteopenia Pt currently taking Calcium and Vit D daily. PT's last Dexascan 126/94/8546Metabolic Syndrome Pt does not exercise, but does try to watch was she eats. Last glucose was 97 on 04/15/16.  Review of Systems  Constitutional: Negative.  Negative for malaise/fatigue.  HENT: Negative.  Negative for sore throat.   Eyes: Positive for blurred vision.  Respiratory: Negative.  Negative for cough and shortness of breath.   Cardiovascular: Negative.  Negative for palpitations.  Gastrointestinal: Positive for constipation. Negative for bloating, diarrhea, heartburn, hematochezia and nausea.  Endocrine: Negative.   Genitourinary: Negative.   Musculoskeletal: Positive for arthritis.  Neurological: Negative.  Negative for headaches.  Hematological: Negative.   Psychiatric/Behavioral: Negative.   All other systems reviewed and are negative.      Objective:   Physical Exam  Constitutional: She is oriented to person, place, and time. She appears well-developed and well-nourished. No distress.  HENT:  Head: Normocephalic and atraumatic.  Right Ear: External ear normal.  Left Ear: External ear normal.  Nose: Nose normal.  Mouth/Throat: Oropharynx is clear and moist.  Eyes: Pupils are equal, round, and reactive to light.  Neck: Normal range of motion. Neck supple. No thyromegaly present.  Cardiovascular: Normal rate, regular rhythm,  normal heart sounds and intact distal pulses.   No murmur heard. Pulmonary/Chest: Effort normal and breath sounds normal. No respiratory distress. She has no wheezes.  Abdominal: Soft. Bowel sounds are normal. She exhibits no distension. There is no tenderness.  Musculoskeletal: Normal range of motion. She exhibits no edema or tenderness.  Neurological: She is alert and oriented to person, place, and time.  Skin: Skin is warm and dry.  Psychiatric: She has a normal mood and affect. Her behavior is normal. Judgment and thought content normal.  Vitals reviewed.     BP 113/72   Pulse 70   Temp 97.6 F (36.4 C) (Oral)   Ht 5' 2"  (1.575 m)   Wt 180 lb 6.4 oz (81.8 kg)   BMI 33.00 kg/m      Assessment & Plan:  1. Essential hypertension - CMP14+EGFR  2. Allergic rhinitis due to pollen, unspecified chronicity, unspecified seasonality - CMP14+EGFR  3. Gastroesophageal reflux disease without esophagitis - CMP14+EGFR  4. Slow transit constipation - CMP14+EGFR  5. Hereditary and idiopathic peripheral neuropathy  - CMP14+EGFR  6. Vitamin D deficiency - CMP14+EGFR - VITAMIN D 25 Hydroxy (Vit-D Deficiency, Fractures)  7. Obesity (BMI 30-39.9) - SYV48+YOYO  8. Metabolic syndrome - OJZ53+MZUA  9. Hyperlipidemia with target LDL less than 100 - CMP14+EGFR - Lipid panel  10. Osteoarthritis, unspecified osteoarthritis type, unspecified site  - CMP14+EGFR  11. Osteopenia, unspecified location - CMP14+EGFR   Continue all meds Labs pending Health Maintenance reviewed Diet and exercise encouraged RTO 6 months   Evelina Dun, FNP

## 2016-10-17 LAB — CMP14+EGFR
ALT: 9 IU/L (ref 0–32)
AST: 13 IU/L (ref 0–40)
Albumin/Globulin Ratio: 1.5 (ref 1.2–2.2)
Albumin: 3.8 g/dL (ref 3.5–4.8)
Alkaline Phosphatase: 123 IU/L — ABNORMAL HIGH (ref 39–117)
BUN/Creatinine Ratio: 18 (ref 12–28)
BUN: 15 mg/dL (ref 8–27)
Bilirubin Total: 0.3 mg/dL (ref 0.0–1.2)
CO2: 29 mmol/L (ref 18–29)
Calcium: 9.5 mg/dL (ref 8.7–10.3)
Chloride: 99 mmol/L (ref 96–106)
Creatinine, Ser: 0.82 mg/dL (ref 0.57–1.00)
GFR calc Af Amer: 83 mL/min/{1.73_m2} (ref >59)
GFR calc non Af Amer: 72 mL/min/{1.73_m2} (ref >59)
Globulin, Total: 2.5 g/dL (ref 1.5–4.5)
Glucose: 93 mg/dL (ref 65–99)
Potassium: 3.5 mmol/L (ref 3.5–5.2)
Sodium: 143 mmol/L (ref 134–144)
Total Protein: 6.3 g/dL (ref 6.0–8.5)

## 2016-10-17 LAB — LIPID PANEL
Chol/HDL Ratio: 3.3 ratio units (ref 0.0–4.4)
Cholesterol, Total: 157 mg/dL (ref 100–199)
HDL: 47 mg/dL (ref >39)
LDL Calculated: 81 mg/dL (ref 0–99)
Triglycerides: 145 mg/dL (ref 0–149)
VLDL Cholesterol Cal: 29 mg/dL (ref 5–40)

## 2016-10-17 LAB — VITAMIN D 25 HYDROXY (VIT D DEFICIENCY, FRACTURES): Vit D, 25-Hydroxy: 37.9 ng/mL (ref 30.0–100.0)

## 2016-11-10 ENCOUNTER — Other Ambulatory Visit: Payer: Self-pay

## 2016-11-10 DIAGNOSIS — K219 Gastro-esophageal reflux disease without esophagitis: Secondary | ICD-10-CM

## 2016-11-10 DIAGNOSIS — E785 Hyperlipidemia, unspecified: Secondary | ICD-10-CM

## 2016-11-10 MED ORDER — PANTOPRAZOLE SODIUM 40 MG PO TBEC: DELAYED_RELEASE_TABLET | ORAL | 0 refills | Status: DC

## 2016-11-10 MED ORDER — GABAPENTIN 600 MG PO TABS: ORAL_TABLET | ORAL | 0 refills | Status: DC

## 2016-11-10 MED ORDER — SIMVASTATIN 40 MG PO TABS: ORAL_TABLET | ORAL | 0 refills | Status: DC

## 2016-11-10 MED ORDER — HYDROCHLOROTHIAZIDE 25 MG PO TABS: 25.0000 mg | ORAL_TABLET | Freq: Every day | ORAL | 0 refills | Status: DC

## 2016-11-18 ENCOUNTER — Encounter: Payer: Self-pay | Admitting: Family

## 2016-11-18 ENCOUNTER — Ambulatory Visit (INDEPENDENT_AMBULATORY_CARE_PROVIDER_SITE_OTHER): Payer: Medicare HMO | Admitting: Family

## 2016-11-18 VITALS — BP 135/78 | HR 71 | Temp 97.5°F | Ht 62.0 in | Wt 184.6 lb

## 2016-11-18 DIAGNOSIS — J0100 Acute maxillary sinusitis, unspecified: Secondary | ICD-10-CM | POA: Diagnosis not present

## 2016-11-18 MED ORDER — AMOXICILLIN-POT CLAVULANATE 875-125 MG PO TABS: 1.0000 | ORAL_TABLET | Freq: Two times a day (BID) | ORAL | 0 refills | Status: DC

## 2016-11-18 NOTE — Patient Instructions (Signed)

## 2016-11-18 NOTE — Progress Notes (Signed)
   Subjective:    Patient ID: Charlotte Aguirre, female    DOB: 1946/02/29, 71 y.o.   MRN: [id number]  Ear Fullness   There is pain in the left ear. This is a new problem. The current episode started in the past 7 days. The problem occurs every few minutes. There has been no fever. The pain is at a severity of 10/10 (comes and goes). The pain is mild. Associated symptoms include headaches. Pertinent negatives include no coughing, ear discharge, sore throat or vomiting. Associated symptoms comments: Left sinus tenderness . She has tried acetaminophen for the symptoms. The treatment provided mild relief.  Headache   Pertinent negatives include no coughing, sore throat or vomiting.      Review of Systems  HENT: Negative for ear discharge and sore throat.   Respiratory: Negative for cough.   Gastrointestinal: Negative for vomiting.  Neurological: Positive for headaches.  All other systems reviewed and are negative.      Objective:   Physical Exam  Constitutional: She is oriented to person, place, and time. She appears well-developed and well-nourished. No distress.  HENT:  Head: Normocephalic and atraumatic.  Right Ear: External ear normal.  Left Ear: External ear normal.  Nose: Mucosal edema present. Left sinus exhibits maxillary sinus tenderness.  Mouth/Throat: Oropharynx is clear and moist.  Eyes: Pupils are equal, round, and reactive to light.  Neck: Normal range of motion. Neck supple. No thyromegaly present.  Cardiovascular: Normal rate, regular rhythm, normal heart sounds and intact distal pulses.   No murmur heard. Pulmonary/Chest: Effort normal and breath sounds normal. No respiratory distress. She has no wheezes.  Abdominal: Soft. Bowel sounds are normal. She exhibits no distension. There is no tenderness.  Musculoskeletal: Normal range of motion. She exhibits no edema or tenderness.  Neurological: She is alert and oriented to person, place, and time.  Skin: Skin is warm and  dry.  Psychiatric: She has a normal mood and affect. Her behavior is normal. Judgment and thought content normal.  Vitals reviewed.   BP 140/77   Pulse 68   Temp 97.5 F (36.4 C) (Oral)   Ht 5\' 2"  (1.575 m)   Wt 184 lb 9.6 oz (83.7 kg)   BMI 33.76 kg/m      Assessment & Plan:  1. Acute maxillary sinusitis, recurrence not specified - Take meds as prescribed - Use a cool mist humidifier  -Use saline nose sprays frequently -Saline irrigations of the nose can be very helpful if done frequently.  * 4X daily for 1 week*  * Use of a nettie pot can be helpful with this. Follow directions with this* -Force fluids -For any cough or congestion  Use plain Mucinex- regular strength or max strength is fine   * Children- consult with Pharmacist for dosing -For fever or aces or pains- take tylenol or ibuprofen appropriate for age and weight.  * for fevers greater than 101 orally you may alternate ibuprofen and tylenol every  3 hours. -Throat lozenges if help -Discussed hold off on starting antibiotic for 2 days, if symptoms worsen start Augmentin, if improve do not start - amoxicillin-clavulanate (AUGMENTIN) 875-125 MG tablet; Take 1 tablet by mouth 2 (two) times daily.  Dispense: 14 tablet; Refill: 0  Evelina Dun, FNP

## 2016-12-17 DIAGNOSIS — Z0100 Encounter for examination of eyes and vision without abnormal findings: Secondary | ICD-10-CM | POA: Diagnosis not present

## 2016-12-17 DIAGNOSIS — H524 Presbyopia: Secondary | ICD-10-CM | POA: Diagnosis not present

## 2017-01-21 ENCOUNTER — Encounter (INDEPENDENT_AMBULATORY_CARE_PROVIDER_SITE_OTHER): Payer: Self-pay

## 2017-01-21 ENCOUNTER — Ambulatory Visit (INDEPENDENT_AMBULATORY_CARE_PROVIDER_SITE_OTHER): Payer: Medicare HMO | Admitting: Internal Medicine

## 2017-01-21 ENCOUNTER — Encounter (INDEPENDENT_AMBULATORY_CARE_PROVIDER_SITE_OTHER): Payer: Self-pay | Admitting: Internal Medicine

## 2017-01-21 VITALS — BP 120/80 | HR 72 | Temp 98.2°F | Ht 62.0 in | Wt 186.4 lb

## 2017-01-21 DIAGNOSIS — R159 Full incontinence of feces: Secondary | ICD-10-CM

## 2017-01-21 NOTE — Progress Notes (Signed)
Subjective:    Patient ID: Charlotte Aguirre, female    DOB: [birth year], 71 y.o.   MRN: [id number]  HPI  Here today for f/u. She was last seen in October of 2016. Hx of IBS.  In December of 2016 she had a rectocele and she had it repaired. After the surgery, she started having a BM daily with any stool softeners. Sometimes she will have 2-3 stools a day.  She says sometimes when she voids, she will have a small BM without any knowing it.   She says sometimes she is incontinent at night sometimes. Her stools will be loose.    03/22/2014 Colonoscopy  Indications: Patient is 71 year old Caucasian female was undergoing average risk screening colonoscopy. Her last exam was about 14 years ago. Small polyp ablated via cold biopsy from splenic flexure. Mild sigmoid colon diverticulosis.  Incidental finding of small rectal AV malformation.   Biopsy results reviewed with patient. Patient had small polyp removed his tubular adenoma Next colonoscopy in 10 years.  Review of Systems Past Medical History:  Diagnosis Date  . Arthritis    thumb and fingers  . Cataract   . Fibromyalgia   . GERD (gastroesophageal reflux disease)   . Headache(784.0)   . Hyperlipidemia   . Hypertension   . IBS (irritable bowel syndrome)   . Memory changes   . Osteopenia   . Peripheral neuropathy ([place])   . Ulcer ([place])    gastric  . Vitamin D insufficiency   . Wrist fracture 2013    Past Surgical History:  Procedure Laterality Date  . ABDOMINAL HYSTERECTOMY     uterus frist and 10 years later both ovaries removed  . CHOLECYSTECTOMY    . COLONOSCOPY N/A 03/22/2014   Procedure: COLONOSCOPY;  Surgeon: Rogene Houston, MD;  [facility]: [facility];  Service: Endoscopy;  Laterality: N/A;  930  . OOPHORECTOMY      Allergies  Allergen Reactions  . Aspirin     Must take EC, prior history of stomach ulcer.  . Topiramate Other (See Comments)    hallucinations    Current Outpatient Prescriptions on File Prior  to Visit  Medication Sig Dispense Refill  . aspirin 81 MG chewable tablet Chew by mouth daily.    . calcium-vitamin D (OSCAL WITH D) 500-200 MG-UNIT per tablet Take 1 tablet by mouth 2 (two) times daily.    . Cholecalciferol (VITAMIN D-3 PO) Take 1 tablet by mouth 2 (two) times daily. 1000mg     . fluticasone (FLONASE) 50 MCG/ACT nasal spray Place 2 sprays into both nostrils daily. 16 g 6  . gabapentin (NEURONTIN) 600 MG tablet Take 2 tablets by mouth teach morning, three each evening (Patient taking differently: Take 5 tabs a day) 450 tablet 0  . hydrochlorothiazide (HYDRODIURIL) 25 MG tablet Take 1 tablet (25 mg total) by mouth daily. 90 tablet 0  . Multiple Vitamins-Minerals (MULTIVITAMINS THER. W/MINERALS) TABS tablet Take 1 tablet by mouth daily.    . pantoprazole (PROTONIX) 40 MG tablet Take 1 tablet by mouth  daily 90 tablet 0  . simvastatin (ZOCOR) 40 MG tablet Take 1 tablet by mouth  daily 90 tablet 0   No current facility-administered medications on file prior to visit.        Objective:   Physical Exam Blood pressure 120/80, pulse 72, temperature 98.2 F (36.8 C), height 5\' 2"  (1.575 m), weight 186 lb 6.4 oz (84.6 kg).  Alert and oriented. Skin warm and dry. Oral mucosa  is moist.   . Sclera anicteric, conjunctivae is pink. Thyroid not enlarged. No cervical lymphadenopathy. Lungs clear. Heart regular rate and rhythm.  Abdomen is soft. Bowel sounds are positive. No hepatomegaly. No abdominal masses felt. No tenderness.  No edema to lower extremities.  Stool negative for blood.       Assessment & Plan:  Incontinence: Fiber 4 gm. Kegel exercises.  Kegel exercises.  Fiber 4 gms or get fiber bars.

## 2017-01-21 NOTE — Patient Instructions (Signed)
Kegel exercises. Fiber 4 gm or get Fiber bars.

## 2017-02-07 ENCOUNTER — Other Ambulatory Visit: Payer: Self-pay | Admitting: Family

## 2017-02-07 DIAGNOSIS — E785 Hyperlipidemia, unspecified: Secondary | ICD-10-CM

## 2017-02-07 DIAGNOSIS — K219 Gastro-esophageal reflux disease without esophagitis: Secondary | ICD-10-CM

## 2017-02-11 DIAGNOSIS — H35372 Puckering of macula, left eye: Secondary | ICD-10-CM | POA: Diagnosis not present

## 2017-02-11 DIAGNOSIS — H43393 Other vitreous opacities, bilateral: Secondary | ICD-10-CM | POA: Diagnosis not present

## 2017-02-11 DIAGNOSIS — H353131 Nonexudative age-related macular degeneration, bilateral, early dry stage: Secondary | ICD-10-CM | POA: Diagnosis not present

## 2017-02-11 DIAGNOSIS — H04123 Dry eye syndrome of bilateral lacrimal glands: Secondary | ICD-10-CM | POA: Diagnosis not present

## 2017-02-26 ENCOUNTER — Encounter: Payer: Self-pay | Admitting: Family

## 2017-02-26 ENCOUNTER — Ambulatory Visit (INDEPENDENT_AMBULATORY_CARE_PROVIDER_SITE_OTHER): Payer: Medicare HMO | Admitting: Family

## 2017-02-26 VITALS — BP 117/73 | HR 72 | Temp 98.3°F | Ht 62.0 in | Wt 181.4 lb

## 2017-02-26 DIAGNOSIS — R102 Pelvic and perineal pain: Secondary | ICD-10-CM

## 2017-02-26 DIAGNOSIS — K5792 Diverticulitis of intestine, part unspecified, without perforation or abscess without bleeding: Secondary | ICD-10-CM

## 2017-02-26 DIAGNOSIS — B373 Candidiasis of vulva and vagina: Secondary | ICD-10-CM | POA: Diagnosis not present

## 2017-02-26 DIAGNOSIS — B3731 Acute candidiasis of vulva and vagina: Secondary | ICD-10-CM

## 2017-02-26 LAB — URINALYSIS, COMPLETE
Bilirubin, UA: NEGATIVE
Glucose, UA: NEGATIVE
Ketones, UA: NEGATIVE
Nitrite, UA: NEGATIVE
Protein, UA: NEGATIVE
RBC, UA: NEGATIVE
Specific Gravity, UA: 1.015 (ref 1.005–1.030)
Urobilinogen, Ur: 0.2 mg/dL (ref 0.2–1.0)
pH, UA: 8.5 — ABNORMAL HIGH (ref 5.0–7.5)

## 2017-02-26 LAB — MICROSCOPIC EXAMINATION: Epithelial Cells (non renal): >10 /hpf — AB (ref 0–10)

## 2017-02-26 MED ORDER — FLUCONAZOLE 150 MG PO TABS: 150.0000 mg | ORAL_TABLET | ORAL | 0 refills | Status: DC | PRN

## 2017-02-26 MED ORDER — METRONIDAZOLE 500 MG PO TABS: 500.0000 mg | ORAL_TABLET | Freq: Three times a day (TID) | ORAL | 0 refills | Status: DC

## 2017-02-26 MED ORDER — CIPROFLOXACIN HCL 500 MG PO TABS: 500.0000 mg | ORAL_TABLET | Freq: Two times a day (BID) | ORAL | 0 refills | Status: DC

## 2017-02-26 NOTE — Patient Instructions (Signed)

## 2017-02-26 NOTE — Progress Notes (Signed)
   Subjective:    Patient ID: Charlotte Aguirre, female    DOB: January 24, 1946, 71 y.o.   MRN: [id number]  Pt presents to the office today with lower abdominal pressure. Pt states when she walks she has intermittent pain of 2 out 10 . Pt states she had constipation over the last few days that has improved with daily stool softener. Abdominal Pain  This is a new problem. The current episode started in the past 7 days. The onset quality is gradual. The problem occurs intermittently. The problem has been waxing and waning. The pain is located in the LLQ. The pain is moderate. The quality of the pain is cramping. The abdominal pain does not radiate. Associated symptoms include constipation. Pertinent negatives include no dysuria, frequency or hematuria. The pain is aggravated by movement. She has tried nothing for the symptoms. The treatment provided no relief.      Review of Systems  Gastrointestinal: Positive for abdominal pain and constipation.  Genitourinary: Positive for pelvic pain. Negative for dysuria, flank pain, frequency, hematuria, vaginal bleeding, vaginal discharge and vaginal pain.  All other systems reviewed and are negative.      Objective:   Physical Exam  Constitutional: She is oriented to person, place, and time. She appears well-developed and well-nourished. No distress.  HENT:  Head: Normocephalic.  Eyes: Pupils are equal, round, and reactive to light.  Cardiovascular: Normal rate, regular rhythm, normal heart sounds and intact distal pulses.   No murmur heard. Pulmonary/Chest: Effort normal and breath sounds normal. No respiratory distress. She has no wheezes.  Abdominal: Soft. Bowel sounds are normal. She exhibits no distension. There is tenderness (LLQ pain).  Musculoskeletal: Normal range of motion. She exhibits no edema or tenderness.  Neurological: She is alert and oriented to person, place, and time.  Skin: Skin is warm and dry.  Psychiatric: She has a normal mood and  affect. Her behavior is normal. Judgment and thought content normal.  Vitals reviewed.     BP 117/73   Pulse 72   Temp 98.3 F (36.8 C) (Oral)   Ht 5\' 2"  (1.575 m)   Wt 181 lb 6.4 oz (82.3 kg)   BMI 33.18 kg/m      Assessment & Plan:  1. Pelvic pain in female - Urinalysis, Complete  2. Diverticulitis Increase fiber Clear liquid diet If pain worsens return to office - ciprofloxacin (CIPRO) 500 MG tablet; Take 1 tablet (500 mg total) by mouth 2 (two) times daily.  Dispense: 14 tablet; Refill: 0 - metroNIDAZOLE (FLAGYL) 500 MG tablet; Take 1 tablet (500 mg total) by mouth 3 (three) times daily.  Dispense: 21 tablet; Refill: 0  3. Vagina, candidiasis Keep clean and dry Probiotic  - fluconazole (DIFLUCAN) 150 MG tablet; Take 1 tablet (150 mg total) by mouth every three (3) days as needed.  Dispense: 3 tablet; Refill: 0  Evelina Dun, FNP

## 2017-03-07 ENCOUNTER — Ambulatory Visit (INDEPENDENT_AMBULATORY_CARE_PROVIDER_SITE_OTHER): Payer: Medicare HMO | Admitting: Family

## 2017-03-07 VITALS — BP 113/76 | HR 83 | Temp 98.4°F | Ht 62.0 in | Wt 181.8 lb

## 2017-03-07 DIAGNOSIS — R197 Diarrhea, unspecified: Secondary | ICD-10-CM

## 2017-03-07 DIAGNOSIS — R110 Nausea: Secondary | ICD-10-CM | POA: Diagnosis not present

## 2017-03-07 MED ORDER — ONDANSETRON HCL 4 MG PO TABS: 4.0000 mg | ORAL_TABLET | Freq: Three times a day (TID) | ORAL | 0 refills | Status: DC | PRN

## 2017-03-07 NOTE — Patient Instructions (Signed)

## 2017-03-07 NOTE — Progress Notes (Signed)
   Subjective:    Patient ID: Charlotte Aguirre, female    DOB: July 13, 1946, 71 y.o.   MRN: [id number]  Diarrhea   The current episode started in the past 7 days. The problem occurs 5 to 10 times per day. The problem has been waxing and waning. The stool consistency is described as watery. The patient states that diarrhea awakens her from sleep. Associated symptoms include bloating, chills, increased flatus and sweats. Pertinent negatives include no abdominal pain, fever or vomiting. Associated symptoms comments: Nausea . Risk factors include recent antibiotic use. She has tried increased fluids for the symptoms. The treatment provided mild relief.      Review of Systems  Constitutional: Positive for chills. Negative for fever.  Gastrointestinal: Positive for bloating, diarrhea and flatus. Negative for abdominal pain and vomiting.  All other systems reviewed and are negative.      Objective:   Physical Exam  Constitutional: She is oriented to person, place, and time. She appears well-developed and well-nourished. No distress.  HENT:  Head: Normocephalic and atraumatic.  Cardiovascular: Normal rate, regular rhythm, normal heart sounds and intact distal pulses.   No murmur heard. Pulmonary/Chest: Effort normal and breath sounds normal. No respiratory distress. She has no wheezes.  Abdominal: Soft. Bowel sounds are normal. She exhibits no distension. There is tenderness (mild generalized ).  Musculoskeletal: Normal range of motion. She exhibits no edema or tenderness.  Neurological: She is alert and oriented to person, place, and time.  Skin: Skin is warm and dry.  Psychiatric: She has a normal mood and affect. Her behavior is normal. Judgment and thought content normal.  Vitals reviewed.     BP 113/76   Pulse 83   Temp 98.4 F (36.9 C) (Oral)   Ht _0  (1.575 m)   Wt 181 lb 12.8 oz (82.5 kg)   BMI 33.25 kg/m      Assessment & Plan:  1. Diarrhea, unspecified type - BMP8+EGFR -  Cdiff NAA+O+P+Stool Culture - CBC with Differential/Platelet  2. Nausea - ondansetron (ZOFRAN) 4 MG tablet; Take 1 tablet (4 mg total) by mouth every 8 (eight) hours as needed for nausea or vomiting.  Dispense: 20 tablet; Refill: 0 - Cdiff NAA+O+P+Stool Culture - CBC with Differential/Platelet   Force fluids Bland diet Zofran given prn Labs pending- Rule out C Diff since pt had recent antibiotics and 5-10 stools a day RTO prn   Evelina Dun, FNP

## 2017-03-08 LAB — CBC WITH DIFFERENTIAL/PLATELET
Basophils Absolute: 0.1 10*3/uL (ref 0.0–0.2)
Basos: 1 %
EOS (ABSOLUTE): 0.2 10*3/uL (ref 0.0–0.4)
Eos: 2 %
Hematocrit: 42.1 % (ref 34.0–46.6)
Hemoglobin: 13.9 g/dL (ref 11.1–15.9)
Immature Grans (Abs): 0 10*3/uL (ref 0.0–0.1)
Immature Granulocytes: 0 %
Lymphocytes Absolute: 2.6 10*3/uL (ref 0.7–3.1)
Lymphs: 29 %
MCH: 27.3 pg (ref 26.6–33.0)
MCHC: 33 g/dL (ref 31.5–35.7)
MCV: 83 fL (ref 79–97)
Monocytes Absolute: 0.5 10*3/uL (ref 0.1–0.9)
Monocytes: 6 %
Neutrophils Absolute: 5.5 10*3/uL (ref 1.4–7.0)
Neutrophils: 62 %
Platelets: 267 10*3/uL (ref 150–379)
RBC: 5.1 x10E6/uL (ref 3.77–5.28)
RDW: 14.2 % (ref 12.3–15.4)
WBC: 8.9 10*3/uL (ref 3.4–10.8)

## 2017-03-08 LAB — BMP8+EGFR
BUN/Creatinine Ratio: 18 (ref 12–28)
BUN: 14 mg/dL (ref 8–27)
CO2: 28 mmol/L (ref 18–29)
Calcium: 9.2 mg/dL (ref 8.7–10.3)
Chloride: 103 mmol/L (ref 96–106)
Creatinine, Ser: 0.76 mg/dL (ref 0.57–1.00)
GFR calc Af Amer: 91 mL/min/{1.73_m2} (ref >59)
GFR calc non Af Amer: 79 mL/min/{1.73_m2} (ref >59)
Glucose: 114 mg/dL — ABNORMAL HIGH (ref 65–99)
Potassium: 3.8 mmol/L (ref 3.5–5.2)
Sodium: 143 mmol/L (ref 134–144)

## 2017-03-12 LAB — CDIFF NAA+O+P+STOOL CULTURE
E coli, Shiga toxin Assay: NEGATIVE
Toxigenic C. Difficile by PCR: NEGATIVE

## 2017-04-13 ENCOUNTER — Ambulatory Visit (INDEPENDENT_AMBULATORY_CARE_PROVIDER_SITE_OTHER): Payer: Medicare HMO | Admitting: Physician Assistant

## 2017-04-13 VITALS — BP 124/71 | HR 76 | Temp 97.7°F | Ht 62.0 in | Wt 184.0 lb

## 2017-04-13 DIAGNOSIS — R300 Dysuria: Secondary | ICD-10-CM

## 2017-04-13 MED ORDER — NITROFURANTOIN MONOHYD MACRO 100 MG PO CAPS: 100.0000 mg | ORAL_CAPSULE | Freq: Two times a day (BID) | ORAL | 0 refills | Status: DC

## 2017-04-13 MED ORDER — FLUCONAZOLE 150 MG PO TABS: 150.0000 mg | ORAL_TABLET | Freq: Once | ORAL | 0 refills | Status: AC

## 2017-04-13 NOTE — Patient Instructions (Signed)

## 2017-04-13 NOTE — Progress Notes (Signed)
Subjective:     Patient ID: Charlotte Aguirre, female   DOB: Aug 27, 1946, 71 y.o.   MRN: [id number]  HPI Pt with 1 day of dysuria Hx of UTI in the last month  Review of Systems  Constitutional: Negative.   Gastrointestinal: Positive for abdominal pain. Negative for constipation, diarrhea, nausea and vomiting.  Genitourinary: Positive for decreased urine volume, frequency, hematuria and urgency.       Objective:   Physical Exam  Constitutional: She appears well-developed and well-nourished.  Cardiovascular: Normal rate, regular rhythm and normal heart sounds.   No murmur heard. Pulmonary/Chest: Effort normal and breath sounds normal.  Abdominal: Soft. She exhibits no distension. There is tenderness. There is no rebound and no guarding.  + suprapubic TTP No CVAT  Nursing note and vitals reviewed. UA- see labs     Assessment:     Dysuria - Plan: Urinalysis, Complete, Urine Culture      Plan:     Urine culture due to recent UTI Macrobid bid x 1 week Diflucan at the end of therapy Will inform of lab results F/U if sx continue

## 2017-04-15 ENCOUNTER — Other Ambulatory Visit: Payer: Self-pay | Admitting: Physician Assistant

## 2017-04-15 LAB — URINE CULTURE

## 2017-04-15 MED ORDER — SULFAMETHOXAZOLE-TRIMETHOPRIM 800-160 MG PO TABS: 1.0000 | ORAL_TABLET | Freq: Two times a day (BID) | ORAL | 0 refills | Status: DC

## 2017-04-16 MED ORDER — SULFAMETHOXAZOLE-TRIMETHOPRIM 800-160 MG PO TABS: 1.0000 | ORAL_TABLET | Freq: Two times a day (BID) | ORAL | 0 refills | Status: DC

## 2017-04-17 LAB — MICROSCOPIC EXAMINATION
Epithelial Cells (non renal): >10 /hpf — AB (ref 0–10)
Renal Epithel, UA: NONE SEEN /hpf
WBC, UA: >30 /hpf — AB (ref 0–?)

## 2017-04-17 LAB — URINALYSIS, COMPLETE
Bilirubin, UA: NEGATIVE
Glucose, UA: NEGATIVE
Nitrite, UA: NEGATIVE
Specific Gravity, UA: 1.02 (ref 1.005–1.030)
Urobilinogen, Ur: 0.2 mg/dL (ref 0.2–1.0)
pH, UA: 5.5 (ref 5.0–7.5)

## 2017-04-20 ENCOUNTER — Ambulatory Visit: Payer: Medicare HMO | Admitting: Family

## 2017-04-28 ENCOUNTER — Ambulatory Visit (INDEPENDENT_AMBULATORY_CARE_PROVIDER_SITE_OTHER): Payer: Medicare HMO

## 2017-04-28 ENCOUNTER — Encounter: Payer: Self-pay | Admitting: Family

## 2017-04-28 ENCOUNTER — Ambulatory Visit: Payer: Medicare HMO | Admitting: Family

## 2017-04-28 VITALS — BP 130/67 | HR 72 | Temp 97.2°F | Ht 62.0 in | Wt 184.4 lb

## 2017-04-28 DIAGNOSIS — Z780 Asymptomatic menopausal state: Secondary | ICD-10-CM | POA: Diagnosis not present

## 2017-04-28 DIAGNOSIS — Z87440 Personal history of urinary (tract) infections: Secondary | ICD-10-CM | POA: Diagnosis not present

## 2017-04-28 DIAGNOSIS — G609 Hereditary and idiopathic neuropathy, unspecified: Secondary | ICD-10-CM | POA: Diagnosis not present

## 2017-04-28 DIAGNOSIS — K219 Gastro-esophageal reflux disease without esophagitis: Secondary | ICD-10-CM | POA: Diagnosis not present

## 2017-04-28 DIAGNOSIS — I10 Essential (primary) hypertension: Secondary | ICD-10-CM

## 2017-04-28 DIAGNOSIS — M1990 Unspecified osteoarthritis, unspecified site: Secondary | ICD-10-CM

## 2017-04-28 DIAGNOSIS — E669 Obesity, unspecified: Secondary | ICD-10-CM

## 2017-04-28 DIAGNOSIS — E785 Hyperlipidemia, unspecified: Secondary | ICD-10-CM

## 2017-04-28 LAB — URINALYSIS, COMPLETE
Bilirubin, UA: NEGATIVE
Glucose, UA: NEGATIVE
Nitrite, UA: NEGATIVE
Specific Gravity, UA: 1.025 (ref 1.005–1.030)
Urobilinogen, Ur: 0.2 mg/dL (ref 0.2–1.0)
pH, UA: 5.5 (ref 5.0–7.5)

## 2017-04-28 LAB — CMP14+EGFR
ALT: 10 IU/L (ref 0–32)
AST: 16 IU/L (ref 0–40)
Albumin/Globulin Ratio: 1.6 (ref 1.2–2.2)
Albumin: 3.8 g/dL (ref 3.5–4.8)
Alkaline Phosphatase: 101 IU/L (ref 39–117)
BUN/Creatinine Ratio: 16 (ref 12–28)
BUN: 12 mg/dL (ref 8–27)
Bilirubin Total: 0.2 mg/dL (ref 0.0–1.2)
CO2: 28 mmol/L (ref 20–29)
Calcium: 9.4 mg/dL (ref 8.7–10.3)
Chloride: 103 mmol/L (ref 96–106)
Creatinine, Ser: 0.75 mg/dL (ref 0.57–1.00)
GFR calc Af Amer: 92 mL/min/{1.73_m2} (ref >59)
GFR calc non Af Amer: 80 mL/min/{1.73_m2} (ref >59)
Globulin, Total: 2.4 g/dL (ref 1.5–4.5)
Glucose: 96 mg/dL (ref 65–99)
Potassium: 4.3 mmol/L (ref 3.5–5.2)
Sodium: 145 mmol/L — ABNORMAL HIGH (ref 134–144)
Total Protein: 6.2 g/dL (ref 6.0–8.5)

## 2017-04-28 LAB — MICROSCOPIC EXAMINATION: Renal Epithel, UA: NONE SEEN /hpf

## 2017-04-28 LAB — LIPID PANEL
Chol/HDL Ratio: 4 ratio (ref 0.0–4.4)
Cholesterol, Total: 153 mg/dL (ref 100–199)
HDL: 38 mg/dL — ABNORMAL LOW (ref >39)
LDL Calculated: 80 mg/dL (ref 0–99)
Triglycerides: 175 mg/dL — ABNORMAL HIGH (ref 0–149)
VLDL Cholesterol Cal: 35 mg/dL (ref 5–40)

## 2017-04-28 NOTE — Progress Notes (Signed)
   Subjective:    Patient ID: Charlotte Aguirre, female    DOB: 11/05/1945, 70 y.o.   MRN: [id number]  Pt presents to the office today with erythemas in bilateral toes. Pt states she has bilateral foot pain in the morning. PT has neuropathy and takes gabapentin 1200 mg AM and 1800 mg in the evening that greatly helps.    Pt had a UTI a few weeks ago and was treated with antibiotics. PT requesting her urine be retested.  Hyperlipidemia  This is a chronic problem. The current episode started more than 1 year ago. The problem is controlled. Exacerbating diseases include obesity. Pertinent negatives include no shortness of breath. Current antihyperlipidemic treatment includes statins. The current treatment provides moderate improvement of lipids.  Gastroesophageal Reflux  She reports no belching or no heartburn. This is a chronic problem. The current episode started more than 1 year ago. The problem occurs occasionally. She has tried a PPI for the symptoms. The treatment provided moderate relief.  Hypertension  This is a chronic problem. The current episode started more than 1 year ago. The problem has been resolved since onset. The problem is controlled. Pertinent negatives include no peripheral edema or shortness of breath.  Arthritis  Presents for follow-up visit. The symptoms have been resolved. Her pain is at a severity of 0/10.      Review of Systems  Respiratory: Negative for shortness of breath.   Gastrointestinal: Negative for heartburn.  Musculoskeletal: Positive for arthritis.  All other systems reviewed and are negative.      Objective:   Physical Exam  Constitutional: She is oriented to person, place, and time. She appears well-developed and well-nourished. No distress.  HENT:  Head: Normocephalic and atraumatic.  Right Ear: External ear normal.  Left Ear: External ear normal.  Nose: Nose normal.  Mouth/Throat: Oropharynx is clear and moist.  Eyes: Pupils are equal, round, and  reactive to light.  Neck: Normal range of motion. Neck supple. No thyromegaly present.  Cardiovascular: Normal rate, regular rhythm, normal heart sounds and intact distal pulses.   No murmur heard. Pulmonary/Chest: Effort normal and breath sounds normal. No respiratory distress. She has no wheezes.  Abdominal: Soft. Bowel sounds are normal. She exhibits no distension. There is no tenderness.  Musculoskeletal: Normal range of motion. She exhibits no edema or tenderness.  No edema or erythemas present bilateral feet  Neurological: She is alert and oriented to person, place, and time.  Skin: Skin is warm and dry.  Psychiatric: She has a normal mood and affect. Her behavior is normal. Judgment and thought content normal.  Vitals reviewed.    BP 130/67   Pulse 72   Temp 97.2 F (36.2 C) (Oral)   Ht 5' 2" (1.575 m)   Wt 184 lb 6.4 oz (83.6 kg)   BMI 33.73 kg/m      Assessment & Plan:  1. History of UTI - Urinalysis, Complete - CMP14+EGFR  2. Essential hypertension - CMP14+EGFR  3. Gastroesophageal reflux disease without esophagitis - CMP14+EGFR  4. Hereditary and idiopathic peripheral neuropathy  - CMP14+EGFR  5. Osteoarthritis, unspecified osteoarthritis type, unspecified site - CMP14+EGFR  6. Obesity (BMI 30-39.9) - CMP14+EGFR  7. Hyperlipidemia with target LDL less than 100 - CMP14+EGFR - Lipid panel  8. Post-menopause - CMP14+EGFR - DG [facility] DEXA   Continue all meds Labs pending Health Maintenance reviewed Diet and exercise encouraged RTO 6 months  Christy Hawks, FNP  

## 2017-04-28 NOTE — Patient Instructions (Signed)
Neuropathic Pain Neuropathic pain is pain caused by damage to the nerves that are responsible for certain sensations in your body (sensory nerves). The pain can be caused by damage to:  The sensory nerves that send signals to your spinal cord and brain (peripheral nervous system).  The sensory nerves in your brain or spinal cord (central nervous system).  Neuropathic pain can make you more sensitive to pain. What would be a minor sensation for most people may feel very painful if you have neuropathic pain. This is usually a long-term condition that can be difficult to treat. The type of pain can differ from person to person. It may start suddenly (acute), or it may develop slowly and last for a long time (chronic). Neuropathic pain may come and go as damaged nerves heal or may stay at the same level for years. It often causes emotional distress, loss of sleep, and a lower quality of life. What are the causes? The most common cause of damage to a sensory nerve is diabetes. Many other diseases and conditions can also cause neuropathic pain. Causes of neuropathic pain can be classified as:  Toxic. Many drugs and chemicals can cause toxic damage. The most common cause of toxic neuropathic pain is damage from drug treatment for cancer (chemotherapy).  Metabolic. This type of pain can happen when a disease causes imbalances that damage nerves. Diabetes is the most common of these diseases. Vitamin B deficiency caused by long-term alcohol abuse is another common cause.  Traumatic. Any injury that cuts, crushes, or stretches a nerve can cause damage and pain. A common example is feeling pain after losing an arm or leg (phantom limb pain).  Compression-related. If a sensory nerve gets trapped or compressed for a long period of time, the blood supply to the nerve can be cut off.  Vascular. Many blood vessel diseases can cause neuropathic pain by decreasing blood supply and oxygen to nerves.  Autoimmune.  This type of pain results from diseases in which the body's defense system mistakenly attacks sensory nerves. Examples of autoimmune diseases that can cause neuropathic pain include lupus and multiple sclerosis.  Infectious. Many types of viral infections can damage sensory nerves and cause pain. Shingles infection is a common cause of this type of pain.  Inherited. Neuropathic pain can be a symptom of many diseases that are passed down through families (genetic).  What are the signs or symptoms? The main symptom is pain. Neuropathic pain is often described as:  Burning.  Shock-like.  Stinging.  Hot or cold.  Itching.  How is this diagnosed? No single test can diagnose neuropathic pain. Your health care provider will do a physical exam and ask you about your pain. You may use a pain scale to describe how bad your pain is. You may also have tests to see if you have a high sensitivity to pain and to help find the cause and location of any sensory nerve damage. These tests may include:  Imaging studies, such as: ? X-rays. ? CT scan. ? MRI.  Nerve conduction studies to test how well nerve signals travel through your sensory nerves (electrodiagnostic testing).  Stimulating your sensory nerves through electrodes on your skin and measuring the response in your spinal cord and brain (somatosensory evoked potentials).  How is this treated? Treatment for neuropathic pain may change over time. You may need to try different treatment options or a combination of treatments. Some options include:  Over-the-counter pain relievers.  Prescription medicines. Some medicines   used to treat other conditions may also help neuropathic pain. These include medicines to: ? Control seizures (anticonvulsants). ? Relieve depression (antidepressants).  Prescription-strength pain relievers (narcotics). These are usually used when other pain relievers do not help.  Transcutaneous nerve stimulation (TENS).  This uses electrical currents to block painful nerve signals. The treatment is painless.  Topical and local anesthetics. These are medicines that numb the nerves. They can be injected as a nerve block or applied to the skin.  Alternative treatments, such as: ? Acupuncture. ? Meditation. ? Massage. ? Physical therapy. ? Pain management programs. ? Counseling.  Follow these instructions at home:  Learn as much as you can about your condition.  Take medicines only as directed by your health care provider.  Work closely with all your health care providers to find what works best for you.  Have a good support system at home.  Consider joining a chronic pain support group. Contact a health care provider if:  Your pain treatments are not helping.  You are having side effects from your medicines.  You are struggling with fatigue, mood changes, depression, or anxiety. This information is not intended to replace advice given to you by your health care provider. Make sure you discuss any questions you have with your health care provider. Document Released: 07/17/2004 Document Revised: 05/09/2016 Document Reviewed: 03/30/2014 [employer] Interactive Patient Education  2018 [employer]  

## 2017-04-29 ENCOUNTER — Ambulatory Visit (INDEPENDENT_AMBULATORY_CARE_PROVIDER_SITE_OTHER): Payer: Medicare HMO | Admitting: Pharmacist

## 2017-04-29 ENCOUNTER — Other Ambulatory Visit: Payer: Self-pay | Admitting: Family

## 2017-04-29 DIAGNOSIS — M8589 Other specified disorders of bone density and structure, multiple sites: Secondary | ICD-10-CM | POA: Diagnosis not present

## 2017-04-29 MED ORDER — CALCIUM CITRATE-VITAMIN D 315-200 MG-UNIT PO TABS: 1.0000 | ORAL_TABLET | Freq: Two times a day (BID) | ORAL | Status: DC

## 2017-04-29 MED ORDER — CIPROFLOXACIN HCL 500 MG PO TABS: 500.0000 mg | ORAL_TABLET | Freq: Two times a day (BID) | ORAL | 0 refills | Status: DC

## 2017-04-29 NOTE — Patient Instructions (Signed)
Switch to calcium citrate - 315 to 544m take 1 per day Continue to drink milk daily Continue to take multivitamin - but make sure to separate from calcium.  Calcium & Vitamin D: The Facts  Why is calcium and vitamin D consumption important? Calcium: . Most Americans do not consume adequate amounts of calcium! Calcium is required for proper muscle function, nerve communication, bone support, and many other functions in the body.  . The body uses bones as a source of calcium. Bones 'remodel' themselves continuously - the body constantly breaks bone down to release calcium and rebuilds bones by replacing calcium in the bone later.  . As we get older, the rate of bone breakdown occurs faster than bone rebuilding which could lead to osteopenia, osteoporosis, and possible fractures.   Vitamin D: . People naturally make vitamin D in the body when sunlight hits the skin and triggers a process that leads to vitamin D production. This natural vitamin D production requires about 10-15 minutes of sun exposure on the hands, arms, and face at least 2-3 times per week. However, due to decreased sun exposure and the use of sunscreen, most people will need to get additional vitamin D from foods or supplements. Your doctor can measure your body's vitamin D level through a simple blood test to determine your daily vitamin D needs.  . Vitamin D is used to help the body absorb calcium, maintain bone health, help the immune system, and reduce inflammation. It also plays a role in muscle performance, balance and risk of falling.  . Vitamin D deficiency can lead to osteomalacia or softening of the bones, bone pain, and muscle weakness.   The recommended daily allowance of Calcium and Vitamin D varies for different age groups. Age group Calcium (mg) Vitamin D (IU)  Females and Males: Age 936-50 1000 mg 600 IU  Females: Age 73- 581200 mg 600 IU  Males: Age 73-701000 mg 600 IU  Females and Males: Age 41+ 1200 mg 800 IU   Pregnant/lactating Females age 122-501000 mg 600 IU   How much Calcium do you get in your diet? Calcium Intake # of servings per day  Total calcium (mg)  Skim milk, 2% milk (1 cup) _________ x 300 mg   Yogurt (1 small container) _________ x 200 mg   Cheese (1oz) _________ x 200 mg   Cottage Cheese (1 cup)             ________ x 150 mg   Almond milk (1 cup) _________ x 450 mg   Fortified Orange Juice (1 cup) _________ x 300 mg   Broccoli or spinach ( 1 cup) _________ x 100 mg   Salmon (3 oz) _________ x 150 mg    Almonds (1/4 cup) _______ x 90 mg      How do we get Calcium and Vitamin D in our diet? Calcium: . Obtaining calcium from the diet is the most preferred way to reach the recommended daily goal. If this goal is not reached through diet, calcium supplements are available.  . Calcium is found in many foods including: dairy products, dark leafy vegetables (like broccoli, kale, and spinach), fish, and fortified products like juices and cereals.  . The food label will have a %DV (percent daily value) listed showing the amount of calcium per serving. To determine the total mg per serving, simply replace the % with zero (0).  For example, Almond Breeze almond milk contains 45% DV of calcium or  415m per 1 cup.  . You can increase the amount of calcium in your diet by using more calcium products in your daily meals. Use yogurt and fruit to make smoothies or use yogurt to top baked potatoes or make whipped potatoes. Sprinkle low fat cheese onto salads or into egg white omelets. You can even add non-fat dry milk powder (3054mcalcium per 1/3 cup) to hot cereals, meat loaf, soups, or potatoes.  . Calcium supplements come in many forms including tablets, chewables, and gummies. Be sure to read the label to determine the correct number of tablets per serving and whether or not to take the supplement with food.  . Calcium carbonate products (Oscal, Caltrate, and Viactiv) are generally better  absorbed when taken with food while calcium citrate products like Citracal can be taken with or without food.  . The body can only absorb about 600 mg of calcium at one time. It is recommended to take calcium supplements in small amounts several times per day.  However, taking it all at once is better than not taking it at all. . Increasing your intake of calcium is essential for bone health, but may also lead to some side effects like constipation, increased gas, bloating or abdominal cramping. To help reduce these side effects, start with 1 tablet per day and slowly increase your intake of the supplement to the recommended doses. It is also recommended that you drink plenty of water each day. Vitamin D: . Very few foods naturally contain vitamin D. However, it is found in saltwater fish (like tuna, salmon and mackerel), beef liver, egg yolks, cheese and vitamin D fortified foods (like yogurt, cereals, orange juice and milk) . The amount of vitamin D in each food or product is listed as %DV on the product label. To determine the total amount of vitamin D per serving, drop the % sign and multiply the number by 4. For example, 1 cup of Almond Breeze almond milk contains 25% DV vitamin D or 100 IU per serving (25 x 4 =100). . Vitamin D is also found in multivitamins and supplements and may be listed as ergocalciferol (vitamin D2) or cholecalciferol (vitamin D3). Each of these forms of vitamin D are equivalent and the daily recommended intake will vary based on your age and the vitamin D levels in your body. Follow your doctor's recommendation for vitamin D intake.                    Exercise for Strong Bones  Exercise is important to build and maintain strong bones / bone density.  There are 2 types of exercises that are important to building and maintaining strong bones:  Weight- bearing and muscle-stregthening.  Weight-bearing Exercises  These exercises include activities that make you move against  gravity while staying upright. Weight-bearing exercises can be high-impact or low-impact.  High-impact weight-bearing exercises help build bones and keep them strong. If you have broken a bone due to osteoporosis or are at risk of breaking a bone, you may need to avoid high-impact exercises. If you're not sure, you should check with your healthcare provider.  Examples of high-impact weight-bearing exercises are: Dancing  Doing high-impact aerobics  Hiking  Jogging/running  Jumping Rope  Stair climbing  Tennis  Low-impact weight-bearing exercises can also help keep bones strong and are a safe alternative if you cannot do high-impact exercises.   Examples of low-impact weight-bearing exercises are: Using elliptical training machines  Doing low-impact aerobics  Using stair-step machines  Fast walking on a treadmill or outside   Muscle-Strengthening Exercises These exercises include activities where you move your body, a weight or some other resistance against gravity. They are also known as resistance exercises and include: Lifting weights  Using elastic exercise bands  Using weight machines  Lifting your own body weight  Functional movements, such as standing and rising up on your toes  Yoga and Pilates can also improve strength, balance and flexibility. However, certain positions may not be safe for people with osteoporosis or those at increased risk of broken bones. For example, exercises that have you bend forward may increase the chance of breaking a bone in the spine.   Non-Impact Exercises There are other types of exercises that can help prevent falls.  Non-impact exercises can help you to improve balance, posture and how well you move in everyday activities. Some of these exercises include: Balance exercises that strengthen your legs and test your balance, such as Tai Chi, can decrease your risk of falls.  Posture exercises that improve your posture and reduce rounded or  "sloping" shoulders can help you decrease the chance of breaking a bone, especially in the spine.  Functional exercises that improve how well you move can help you with everyday activities and decrease your chance of falling and breaking a bone. For example, if you have trouble getting up from a chair or climbing stairs, you should do these activities as exercises.   **A physical therapist can teach you balance, posture and functional exercises. He/she can also help you learn which exercises are safe and appropriate for you.  [facility] has a physical therapy office in [place] in front of our office and referrals can be made for assessments and treatment as needed and strength and balance training.  If you would like to have an assessment with [facility] and our physical therapy team please let a nurse or provider know.   Fall Prevention in the Home Falls can cause injuries and can affect people from all age groups. There are many simple things that you can do to make your home safe and to help prevent falls. What can I do on the outside of my home?  Regularly repair the edges of walkways and driveways and fix any cracks.  Remove high doorway thresholds.  Trim any shrubbery on the main path into your home.  Use bright outdoor lighting.  Clear walkways of debris and clutter, including tools and rocks.  Regularly check that handrails are securely fastened and in good repair. Both sides of any steps should have handrails.  Install guardrails along the edges of any raised decks or porches.  Have leaves, snow, and ice cleared regularly.  Use sand or salt on walkways during winter months.  In the garage, clean up any spills right away, including grease or oil spills. What can I do in the bathroom?  Use night lights.  Install grab bars by the toilet and in the tub and shower. Do not use towel bars as grab bars.  Use non-skid mats or decals on the floor of the tub or shower.  If you need to  sit down while you are in the shower, use a plastic, non-slip stool.  Keep the floor dry. Immediately clean up any water that spills on the floor.  Remove soap buildup in the tub or shower on a regular basis.  Attach bath mats securely with double-sided non-slip rug tape.  Remove throw rugs and other tripping  hazards from the floor. What can I do in the bedroom?  Use night lights.  Make sure that a bedside light is easy to reach.  Do not use oversized bedding that drapes onto the floor.  Have a firm chair that has side arms to use for getting dressed.  Remove throw rugs and other tripping hazards from the floor. What can I do in the kitchen?  Clean up any spills right away.  Avoid walking on wet floors.  Place frequently used items in easy-to-reach places.  If you need to reach for something above you, use a sturdy step stool that has a grab bar.  Keep electrical cables out of the way.  Do not use floor polish or wax that makes floors slippery. If you have to use wax, make sure that it is non-skid floor wax.  Remove throw rugs and other tripping hazards from the floor. What can I do in the stairways?  Do not leave any items on the stairs.  Make sure that there are handrails on both sides of the stairs. Fix handrails that are broken or loose. Make sure that handrails are as long as the stairways.  Check any carpeting to make sure that it is firmly attached to the stairs. Fix any carpet that is loose or worn.  Avoid having throw rugs at the top or bottom of stairways, or secure the rugs with carpet tape to prevent them from moving.  Make sure that you have a light switch at the top of the stairs and the bottom of the stairs. If you do not have them, have them installed. What are some other fall prevention tips?  Wear closed-toe shoes that fit well and support your feet. Wear shoes that have rubber soles or low heels.  When you use a stepladder, make sure that it is  completely opened and that the sides are firmly locked. Have someone hold the ladder while you are using it. Do not climb a closed stepladder.  Add color or contrast paint or tape to grab bars and handrails in your home. Place contrasting color strips on the first and last steps.  Use mobility aids as needed, such as canes, walkers, scooters, and crutches.  Turn on lights if it is dark. Replace any light bulbs that burn out.  Set up furniture so that there are clear paths. Keep the furniture in the same spot.  Fix any uneven floor surfaces.  Choose a carpet design that does not hide the edge of steps of a stairway.  Be aware of any and all pets.  Review your medicines with your healthcare provider. Some medicines can cause dizziness or changes in blood pressure, which increase your risk of falling. Talk with your health care provider about other ways that you can decrease your risk of falls. This may include working with a physical therapist or trainer to improve your strength, balance, and endurance. This information is not intended to replace advice given to you by your health care provider. Make sure you discuss any questions you have with your health care provider. Document Released: 10/10/2002 Document Revised: 03/18/2016 Document Reviewed: 11/24/2014 Elsevier Interactive Patient Education  2017 [employer].

## 2017-04-29 NOTE — Progress Notes (Signed)
Patient ID: Charlotte Aguirre, female   DOB: 27-Aug-1946, 71 y.o.   MRN: [id number]   HPI: Patient referred by PCP Evelina Dun, NP to review DEXA results form yesterday and to discussed treatment  Back Pain?  Yes   - no history of back surgery Kyphosis?  No Prior fracture?  Yes - wrist 2013 after fall from losing balance Med(s) for Osteoporosis/Osteopenia:  Calcium 600mg  bid and vitamin D Med(s) previously tried for Osteoporosis/Osteopenia:  none                                                             PMH: Age at menopause:  Oophorectomy at 71yo Hysterectomy?  Yes Oophorectomy?  Yes HRT? Yes - Former.  Type/duration: premarin - stopped around 2008 Steroid Use?  No Thyroid med?  No History of cancer?  No History of digestive disorders (ie Crohn's)?  Yes - GERD on chronic PPI; IBS Current or previous eating disorders?  No Last Vitamin D Result:  37.9 (10/16/2016) Last GFR Result:  80 (04/28/2017)   FH/SH: Family history of osteoporosis?  Yes  Parent with history of hip fracture?  No Family history of breast cancer?  No Exercise?  No - related to neuropathy Smoking?  No Alcohol?  No    Calcium Assessment Calcium Intake  # of servings/day  Calcium mg  Milk (8 oz) 1  x  300  = 300mg   Yogurt (4 oz) 0 x  200 = 0  Cheese (1 oz) 0 x  200 = 0  Other Calcium sources   250mg   Ca supplement Calcium 600mg  bid MVI qd = 1600mg    Estimated calcium intake per day 2150mg     DEXA Results Date of Test T-Score for AP Spine L1-L4 T-Score for Neck of Left Hip  04/28/2017 -1.4 -1.6  09/13/2014 -0.9 -1.5  08/08/2009 -1.5 -1.8       FRAX 10 year estimate: Total FX risk:  16.1%  (consider medication if >/= 20%) Hip FX risk:  2.7%  (consider medication if >/= 3%)  Assessment: Osteopenia with 10 years fracture risk less than 20% over all and 3% for hip  Recommendations: 1.   Discussed BMD  / DEXA results and discussed fracture risk. 2.  Recommended calcium 1200mg  daily through  supplementation or diet.  Recommended patient switch to calcium citrate due to due of PPI.  Also recommended she separate administration of calcium and MVI to improve absorption 3.  recommend weight bearing exercise - 30 minutes at least 4 days per week.   4.  Counseled and educated about fall risk and prevention.  Recheck DEXA:  2 years  Time spent counseling patient:  30 minutes

## 2017-04-30 ENCOUNTER — Telehealth: Payer: Self-pay | Admitting: *Deleted

## 2017-04-30 NOTE — Telephone Encounter (Signed)
Ciprofloxin was called to [facility] voice mail .  Script was cancelled at [facility] in.  Patient aware.

## 2017-05-04 ENCOUNTER — Encounter: Payer: Self-pay | Admitting: Family Medicine

## 2017-05-04 ENCOUNTER — Ambulatory Visit (HOSPITAL_COMMUNITY)
Admission: RE | Admit: 2017-05-04 | Discharge: 2017-05-04 | Disposition: A | Discharge status: Home / Self Care | Payer: Medicare HMO | Source: Ambulatory Visit | Attending: Family Medicine | Admitting: Family Medicine

## 2017-05-04 ENCOUNTER — Other Ambulatory Visit: Payer: Self-pay

## 2017-05-04 ENCOUNTER — Ambulatory Visit (INDEPENDENT_AMBULATORY_CARE_PROVIDER_SITE_OTHER): Payer: Medicare HMO | Admitting: Family Medicine

## 2017-05-04 ENCOUNTER — Other Ambulatory Visit: Payer: Self-pay | Admitting: Family Medicine

## 2017-05-04 VITALS — BP 132/71 | HR 72 | Temp 97.3°F | Ht 62.0 in | Wt 181.0 lb

## 2017-05-04 DIAGNOSIS — N23 Unspecified renal colic: Secondary | ICD-10-CM | POA: Diagnosis not present

## 2017-05-04 DIAGNOSIS — N281 Cyst of kidney, acquired: Secondary | ICD-10-CM | POA: Diagnosis not present

## 2017-05-04 DIAGNOSIS — R399 Unspecified symptoms and signs involving the genitourinary system: Secondary | ICD-10-CM

## 2017-05-04 DIAGNOSIS — R109 Unspecified abdominal pain: Secondary | ICD-10-CM | POA: Diagnosis not present

## 2017-05-04 LAB — URINALYSIS
Bilirubin, UA: NEGATIVE
Glucose, UA: NEGATIVE
Nitrite, UA: NEGATIVE
Specific Gravity, UA: >1.03 — ABNORMAL HIGH (ref 1.005–1.030)
Urobilinogen, Ur: 0.2 mg/dL (ref 0.2–1.0)
pH, UA: 5.5 (ref 5.0–7.5)

## 2017-05-04 MED ORDER — ONDANSETRON 8 MG PO TBDP: 8.0000 mg | ORAL_TABLET | Freq: Four times a day (QID) | ORAL | 1 refills | Status: DC | PRN

## 2017-05-04 MED ORDER — PREDNISONE 10 MG PO TABS: ORAL_TABLET | ORAL | 0 refills | Status: DC

## 2017-05-04 NOTE — Progress Notes (Addendum)
Subjective:  Patient ID: Charlotte Aguirre, female    DOB: [birth year]  Age: 71 y.o. MRN: [id number]  CC: Flank Pain (pt here today c/o flank pain and nausea)   HPI Charlotte Aguirre presents for Recurrent urinary frequency with mild dysuria. The dysuria is much less now than it was when she was originally treated 2-3 visits ago which dates back 3 weeks. She's been on 2 or 3 different antibiotics. This includes Macrobid Bactrim and Cipro. She continues to have suprapubic pain and left lower quadrant pain as well as left flank pain. It is moderately severe. It has gotten worse in the last week. However it's very worst was prior to treatment with the Bactrim. This is from chart review. Patient mentions use of multiple antibiotics. Says that Cipro never helps. Says the first when she was put on had to be changed because of culture results. This morning she states she is quite nauseous as well. She has some ongoing chills and sweats at night but no worse than usual.  Depression screen [facility] 2/9 05/04/2017 04/28/2017 04/13/2017  Decreased Interest 0 0 0  Down, Depressed, Hopeless 0 0 0  PHQ - 2 Score 0 0 0    History Charlotte Aguirre has a past medical history of Arthritis; Cataract; Fibromyalgia; GERD (gastroesophageal reflux disease); Headache(784.0); Hyperlipidemia; Hypertension; IBS (irritable bowel syndrome); Memory changes; Osteopenia; Peripheral neuropathy; Ulcer; Vitamin D insufficiency; and Wrist fracture (2013).   She has a past surgical history that includes Cholecystectomy; Colonoscopy (N/A, 03/22/2014); Abdominal hysterectomy; and Oophorectomy.   Her family history includes COPD in her sister; Cancer in her brother and brother; Dementia in her brother; Diabetes in her sister; Glaucoma in her father; Heart attack in her brother, brother, brother, father, and sister; Heart failure in her brother, father, mother, and sister; Obesity in her sister; Osteoporosis in her sister; Thyroid disease in her son.She reports that  she quit smoking about 28 years ago. Her smoking use included Cigarettes. She has never used smokeless tobacco. She reports that she does not drink alcohol or use drugs.    ROS Review of Systems  Constitutional: Negative for activity change, appetite change and fever.  HENT: Negative for congestion, rhinorrhea and sore throat.   Eyes: Negative for visual disturbance.  Respiratory: Negative for cough and shortness of breath.   Cardiovascular: Negative for chest pain and palpitations.  Gastrointestinal: Negative for abdominal pain, diarrhea and nausea.  Genitourinary: Positive for dysuria, flank pain (left), frequency and urgency.  Musculoskeletal: Positive for back pain. Negative for arthralgias and myalgias.    Objective:  BP 132/71   Pulse 72   Temp 97.3 F (36.3 C) (Oral)   Ht 5\' 2"  (1.575 m)   Wt 181 lb (82.1 kg)   BMI 33.11 kg/m   BP Readings from Last 3 Encounters:  05/04/17 132/71  04/28/17 130/67  04/13/17 124/71    Wt Readings from Last 3 Encounters:  05/04/17 181 lb (82.1 kg)  04/28/17 184 lb 6.4 oz (83.6 kg)  04/13/17 184 lb (83.5 kg)     Physical Exam  Constitutional: She is oriented to person, place, and time. She appears well-developed and well-nourished.  HENT:  Head: Normocephalic and atraumatic.  Cardiovascular: Normal rate and regular rhythm.   No murmur heard. Pulmonary/Chest: Effort normal and breath sounds normal.  Abdominal: Soft. Bowel sounds are normal. She exhibits no mass. There is no tenderness. There is no rebound and no guarding.  Musculoskeletal: She exhibits tenderness (moderate at left lower back,  paraspinous region at L4-5 minimal at left CVA).  Neurological: She is alert and oriented to person, place, and time.  Skin: Skin is warm and dry.  Psychiatric: She has a normal mood and affect. Her behavior is normal.      Assessment & Plan:   Audia was seen today for flank pain.  Diagnoses and all orders for this visit:  UTI  symptoms -     Urinalysis -     Urine Culture -     CT RENAL STONE STUDY; Future  Renal colic -     CT RENAL STONE STUDY; Future  Other orders -     ondansetron (ZOFRAN-ODT) 8 MG disintegrating tablet; Take 1 tablet (8 mg total) by mouth every 6 (six) hours as needed for nausea or vomiting.       I have discontinued Ms. Snelson's ciprofloxacin. I am also having her start on ondansetron. Additionally, I am having her maintain her aspirin, Cholecalciferol (VITAMIN D-3 PO), multivitamins ther. w/minerals, fluticasone, multivitamin-lutein, Melatonin, gabapentin, pantoprazole, hydrochlorothiazide, simvastatin, and calcium citrate-vitamin D.  Allergies as of 05/04/2017      Reactions   Aspirin    Must take EC, prior history of stomach ulcer.   Topiramate Other (See Comments)   hallucinations      Medication List       Accurate as of 05/04/17  9:39 AM. Always use your most recent med list.          aspirin 81 MG chewable tablet Chew by mouth daily.   calcium citrate-vitamin D 315-200 MG-UNIT tablet Commonly known as:  CALCIUM CITRATE + D Take 1 tablet by mouth 2 (two) times daily.   fluticasone 50 MCG/ACT nasal spray Commonly known as:  FLONASE Place 2 sprays into both nostrils daily.   gabapentin 600 MG tablet Commonly known as:  NEURONTIN TAKE 2 TABLETS BY MOUTH EACH MORNING AND 3 TABLETS EACH EVENING   hydrochlorothiazide 25 MG tablet Commonly known as:  HYDRODIURIL TAKE 1 TABLET (25 MG TOTAL) BY MOUTH DAILY.   Melatonin 1 MG Caps Take 3 mg by mouth.   multivitamins ther. w/minerals Tabs tablet Take 1 tablet by mouth daily.   multivitamin-lutein Caps capsule Take 1 capsule by mouth daily.   ondansetron 8 MG disintegrating tablet Commonly known as:  ZOFRAN-ODT Take 1 tablet (8 mg total) by mouth every 6 (six) hours as needed for nausea or vomiting.   pantoprazole 40 MG tablet Commonly known as:  PROTONIX TAKE 1 TABLET BY MOUTH DAILY   simvastatin 40 MG  tablet Commonly known as:  ZOCOR TAKE 1 TABLET BY MOUTH DAILY   VITAMIN D-3 PO Take 1 tablet by mouth 2 (two) times daily. 1000mg         Follow-up: Return in about 2 weeks (around 05/18/2017).  [name], M.D.

## 2017-05-04 NOTE — Addendum Note (Signed)
Addended by: [name] on: 05/04/2017 09:39 AM   Modules accepted: Orders

## 2017-05-04 NOTE — Progress Notes (Unsigned)
ct 

## 2017-05-06 LAB — URINE CULTURE

## 2017-05-07 ENCOUNTER — Other Ambulatory Visit: Payer: Self-pay | Admitting: Family

## 2017-05-07 DIAGNOSIS — E785 Hyperlipidemia, unspecified: Secondary | ICD-10-CM

## 2017-05-07 DIAGNOSIS — K219 Gastro-esophageal reflux disease without esophagitis: Secondary | ICD-10-CM

## 2017-05-20 DIAGNOSIS — H35372 Puckering of macula, left eye: Secondary | ICD-10-CM | POA: Diagnosis not present

## 2017-05-20 DIAGNOSIS — H43393 Other vitreous opacities, bilateral: Secondary | ICD-10-CM | POA: Diagnosis not present

## 2017-05-20 DIAGNOSIS — H2513 Age-related nuclear cataract, bilateral: Secondary | ICD-10-CM | POA: Diagnosis not present

## 2017-05-20 DIAGNOSIS — H353131 Nonexudative age-related macular degeneration, bilateral, early dry stage: Secondary | ICD-10-CM | POA: Diagnosis not present

## 2017-08-11 ENCOUNTER — Encounter: Payer: Self-pay | Admitting: Family

## 2017-08-11 ENCOUNTER — Ambulatory Visit (INDEPENDENT_AMBULATORY_CARE_PROVIDER_SITE_OTHER): Payer: Medicare HMO | Admitting: Family

## 2017-08-11 ENCOUNTER — Ambulatory Visit: Payer: Medicare HMO

## 2017-08-11 VITALS — BP 119/74 | HR 83 | Temp 97.3°F | Ht 62.0 in | Wt 189.6 lb

## 2017-08-11 DIAGNOSIS — J029 Acute pharyngitis, unspecified: Secondary | ICD-10-CM | POA: Diagnosis not present

## 2017-08-11 DIAGNOSIS — Z23 Encounter for immunization: Secondary | ICD-10-CM

## 2017-08-11 DIAGNOSIS — J301 Allergic rhinitis due to pollen: Secondary | ICD-10-CM | POA: Diagnosis not present

## 2017-08-11 MED ORDER — FLUTICASONE PROPIONATE 50 MCG/ACT NA SUSP: 2.0000 | Freq: Every day | NASAL | 6 refills | Status: DC

## 2017-08-11 MED ORDER — MONTELUKAST SODIUM 10 MG PO TABS: 10.0000 mg | ORAL_TABLET | Freq: Every day | ORAL | 1 refills | Status: DC

## 2017-08-11 NOTE — Progress Notes (Signed)
   Subjective:    Patient ID: Charlotte Aguirre, female    DOB: [birth year], 71 y.o.   MRN: [id number]  Sinus Problem  This is a new problem. The current episode started more than 1 month ago. The problem is unchanged. There has been no fever. Her pain is at a severity of 0/10. She is experiencing no pain. Associated symptoms include coughing and sneezing. Pertinent negatives include no chills, congestion, ear pain, headaches, hoarse voice or sore throat. (Wheezing ) Past treatments include spray decongestants. The treatment provided mild relief.      Review of Systems  Constitutional: Negative for chills.  HENT: Positive for sneezing. Negative for congestion, ear pain, hoarse voice and sore throat.   Respiratory: Positive for cough.   Neurological: Negative for headaches.  All other systems reviewed and are negative.      Objective:   Physical Exam  Constitutional: She is oriented to person, place, and time. She appears well-developed and well-nourished. No distress.  HENT:  Head: Normocephalic and atraumatic.  Right Ear: External ear normal.  Left Ear: External ear normal.  Nose: Mucosal edema and rhinorrhea present.  Mouth/Throat: Posterior oropharyngeal erythema present.  Eyes: Pupils are equal, round, and reactive to light.  Neck: Normal range of motion. Neck supple. No thyromegaly present.  Cardiovascular: Normal rate, regular rhythm, normal heart sounds and intact distal pulses.   No murmur heard. Pulmonary/Chest: Effort normal and breath sounds normal. No respiratory distress. She has no wheezes.  Abdominal: Soft. Bowel sounds are normal. She exhibits no distension. There is no tenderness.  Musculoskeletal: Normal range of motion. She exhibits no edema or tenderness.  Neurological: She is alert and oriented to person, place, and time.  Skin: Skin is warm and dry.  Psychiatric: She has a normal mood and affect. Her behavior is normal. Judgment and thought content normal.  Vitals  reviewed.     BP 119/74   Pulse 83   Temp (!) 97.3 F (36.3 C) (Oral)   Ht 5\' 2"  (1.575 m)   Wt 189 lb 9.6 oz (86 kg)   BMI 34.68 kg/m      Assessment & Plan:  1. Allergic rhinitis due to pollen, unspecified seasonality Continue Flonase Start singulair Avoid allergens - montelukast (SINGULAIR) 10 MG tablet; Take 1 tablet (10 mg total) by mouth at bedtime.  Dispense: 90 tablet; Refill: 1 - fluticasone (FLONASE) 50 MCG/ACT nasal spray; Place 2 sprays into both nostrils daily.  Dispense: 16 g; Refill: 6  2. Sore throat - fluticasone (FLONASE) 50 MCG/ACT nasal spray; Place 2 sprays into both nostrils daily.  Dispense: 16 g; Refill: [place], FNP

## 2017-08-11 NOTE — Patient Instructions (Signed)
Allergic Rhinitis Allergic rhinitis is when the mucous membranes in the nose respond to allergens. Allergens are particles in the air that cause your body to have an allergic reaction. This causes you to release allergic antibodies. Through a chain of events, these eventually cause you to release histamine into the blood stream. Although meant to protect the body, it is this release of histamine that causes your discomfort, such as frequent sneezing, congestion, and an itchy, runny nose. What are the causes? Seasonal allergic rhinitis (hay fever) is caused by pollen allergens that may come from grasses, trees, and weeds. Year-round allergic rhinitis (perennial allergic rhinitis) is caused by allergens such as house dust mites, pet dander, and mold spores. What are the signs or symptoms?  Nasal stuffiness (congestion).  Itchy, runny nose with sneezing and tearing of the eyes. How is this diagnosed? Your health care provider can help you determine the allergen or allergens that trigger your symptoms. If you and your health care provider are unable to determine the allergen, skin or blood testing may be used. Your health care provider will diagnose your condition after taking your health history and performing a physical exam. Your health care provider may assess you for other related conditions, such as asthma, pink eye, or an ear infection. How is this treated? Allergic rhinitis does not have a cure, but it can be controlled by:  Medicines that block allergy symptoms. These may include allergy shots, nasal sprays, and oral antihistamines.  Avoiding the allergen. Hay fever may often be treated with antihistamines in pill or nasal spray forms. Antihistamines block the effects of histamine. There are over-the-counter medicines that may help with nasal congestion and swelling around the eyes. Check with your health care provider before taking or giving this medicine. If avoiding the allergen or the  medicine prescribed do not work, there are many new medicines your health care provider can prescribe. Stronger medicine may be used if initial measures are ineffective. Desensitizing injections can be used if medicine and avoidance does not work. Desensitization is when a patient is given ongoing shots until the body becomes less sensitive to the allergen. Make sure you follow up with your health care provider if problems continue. Follow these instructions at home: It is not possible to completely avoid allergens, but you can reduce your symptoms by taking steps to limit your exposure to them. It helps to know exactly what you are allergic to so that you can avoid your specific triggers. Contact a health care provider if:  You have a fever.  You develop a cough that does not stop easily (persistent).  You have shortness of breath.  You start wheezing.  Symptoms interfere with normal daily activities. This information is not intended to replace advice given to you by your health care provider. Make sure you discuss any questions you have with your health care provider. Document Released: 07/15/2001 Document Revised: 06/20/2016 Document Reviewed: 06/27/2013 Elsevier Interactive Patient Education  2017 [employer]  

## 2017-09-29 ENCOUNTER — Ambulatory Visit: Payer: Medicare HMO | Admitting: Family

## 2017-09-29 ENCOUNTER — Encounter: Payer: Self-pay | Admitting: Family

## 2017-09-29 VITALS — BP 125/71 | HR 73 | Temp 97.7°F | Ht 62.0 in | Wt 188.8 lb

## 2017-09-29 DIAGNOSIS — M545 Low back pain, unspecified: Secondary | ICD-10-CM

## 2017-09-29 DIAGNOSIS — N3001 Acute cystitis with hematuria: Secondary | ICD-10-CM | POA: Diagnosis not present

## 2017-09-29 LAB — URINALYSIS, COMPLETE
Bilirubin, UA: NEGATIVE
Glucose, UA: NEGATIVE
Nitrite, UA: NEGATIVE
Specific Gravity, UA: 1.025 (ref 1.005–1.030)
Urobilinogen, Ur: 0.2 mg/dL (ref 0.2–1.0)
pH, UA: 6 (ref 5.0–7.5)

## 2017-09-29 LAB — MICROSCOPIC EXAMINATION
Epithelial Cells (non renal): >10 /hpf — AB (ref 0–10)
Renal Epithel, UA: NONE SEEN /hpf
WBC, UA: >30 /hpf — AB (ref 0–?)

## 2017-09-29 MED ORDER — NITROFURANTOIN MONOHYD MACRO 100 MG PO CAPS: 100.0000 mg | ORAL_CAPSULE | Freq: Two times a day (BID) | ORAL | 0 refills | Status: DC

## 2017-09-29 NOTE — Patient Instructions (Signed)

## 2017-09-29 NOTE — Progress Notes (Signed)
   Subjective:    Patient ID: Lovie Chol, female    DOB: 04/04/1946, 71 y.o.   MRN: [id number]  Back Pain  The current episode started yesterday. The problem occurs intermittently. The problem has been resolved since onset. Pain location: left flank. The quality of the pain is described as aching. The pain is at a severity of 9/10. The pain is moderate. Pertinent negatives include no dysuria, fever, leg pain or numbness. She has tried bed rest and NSAIDs for the symptoms. The treatment provided moderate relief.      Review of Systems  Constitutional: Negative for fever.  Genitourinary: Negative for dysuria.  Musculoskeletal: Positive for back pain.  Neurological: Negative for numbness.  All other systems reviewed and are negative.      Objective:   Physical Exam  Constitutional: She is oriented to person, place, and time. She appears well-developed and well-nourished. No distress.  HENT:  Head: Normocephalic.  Eyes: Pupils are equal, round, and reactive to light.  Neck: Normal range of motion. Neck supple. No thyromegaly present.  Cardiovascular: Normal rate, regular rhythm, normal heart sounds and intact distal pulses.  No murmur heard. Pulmonary/Chest: Effort normal and breath sounds normal. No respiratory distress. She has no wheezes.  Abdominal: Soft. Bowel sounds are normal. She exhibits no distension. There is no tenderness.  Musculoskeletal: Normal range of motion. She exhibits no edema or tenderness.  Neurological: She is alert and oriented to person, place, and time.  Skin: Skin is warm and dry.  Psychiatric: She has a normal mood and affect. Her behavior is normal. Judgment and thought content normal.  Vitals reviewed.    BP 125/71   Pulse 73   Temp 97.7 F (36.5 C) (Oral)   Ht 5\' 2"  (1.575 m)   Wt 188 lb 12.8 oz (85.6 kg)   BMI 34.53 kg/m      Assessment & Plan:  1. Acute low back pain without sciatica, unspecified back pain laterality - Urinalysis,  Complete  2. Acute cystitis with hematuria Force fluids AZO over the counter X2 days RTO prn Culture pending - Urine Culture - nitrofurantoin, macrocrystal-monohydrate, (MACROBID) 100 MG capsule; Take 1 capsule (100 mg total) by mouth 2 (two) times daily.  Dispense: 10 capsule; Refill: 0   Evelina Dun, FNP

## 2017-10-01 LAB — URINE CULTURE

## 2017-11-05 ENCOUNTER — Encounter: Payer: Self-pay | Admitting: Nurse Practitioner

## 2017-11-05 ENCOUNTER — Ambulatory Visit (INDEPENDENT_AMBULATORY_CARE_PROVIDER_SITE_OTHER): Payer: Medicare HMO | Admitting: Nurse Practitioner

## 2017-11-05 VITALS — BP 129/70 | HR 79 | Temp 98.2°F | Ht 62.0 in | Wt 190.0 lb

## 2017-11-05 DIAGNOSIS — B370 Candidal stomatitis: Secondary | ICD-10-CM

## 2017-11-05 DIAGNOSIS — R590 Localized enlarged lymph nodes: Secondary | ICD-10-CM | POA: Diagnosis not present

## 2017-11-05 MED ORDER — NYSTATIN 100000 UNIT/ML MT SUSP: 5.0000 mL | Freq: Four times a day (QID) | OROMUCOSAL | 0 refills | Status: DC

## 2017-11-05 NOTE — Progress Notes (Signed)
   Subjective:    Patient ID: Charlotte Aguirre, female    DOB: 1946/01/28, 72 y.o.   MRN: [id number]  HPI Patient comes in today c/o mouth and tongue burning as is she has eaten something spicy. Back of tongue feels sore. Started betwenn christmas and new years. Feels some better today.    Review of Systems  Constitutional: Negative.   HENT: Negative.   Respiratory: Negative.   Cardiovascular: Negative.   Gastrointestinal: Negative.   Neurological: Negative.   Psychiatric/Behavioral: Negative.   All other systems reviewed and are negative.      Objective:   Physical Exam  Constitutional: She is oriented to person, place, and time. She appears well-developed and well-nourished.  HENT:  tongue red with white cheesy substance on back of tongue.  Cardiovascular: Normal rate and regular rhythm.  Pulmonary/Chest: Effort normal and breath sounds normal.  Lymphadenopathy:    She has cervical adenopathy (right tonsilar).  Neurological: She is alert and oriented to person, place, and time.  Skin: Skin is warm.  Psychiatric: She has a normal mood and affect. Her behavior is normal. Judgment and thought content normal.   BP 129/70 (BP Location: Left Arm, Patient Position: Sitting, Cuff Size: Normal)   Pulse 79   Temp 98.2 F (36.8 C) (Oral)   Ht 5\' 2"  (1.575 m)   Wt 190 lb (86.2 kg)   BMI 34.75 kg/m       Assessment & Plan:  1. Anterior cervical adenopathy Continue to keep check of area until US done - US Soft Tissue Head/Neck; Future  2. Thrush Avoid spicy and fatty foods - nystatin (MYCOSTATIN) 100000 UNIT/ML suspension; Take 5 mLs (500,000 Units total) by mouth 4 (four) times daily.  Dispense: 60 mL; Refill: 0  Asjah-Margaret Hassell Done, FNP

## 2017-11-05 NOTE — Patient Instructions (Signed)
Oral Thrush, Adult Oral thrush is an infection in your mouth and throat. It causes white patches on your tongue and in your mouth. Follow these instructions at home: Helping with soreness  To lessen your pain: ? Drink cold liquids, like water and iced tea. ? Eat frozen ice pops or frozen juices. ? Eat foods that are easy to swallow, like gelatin and ice cream. ? Drink from a straw if the patches in your mouth are painful. General instructions   Take or use over-the-counter and prescription medicines only as told by your doctor. Medicine for oral thrush may be something to swallow, or it may be something to put on the infected area.  Eat plain yogurt that has live cultures in it. Read the label to make sure.  If you wear dentures: ? Take out your dentures before you go to bed. ? Brush them well. ? Soak them in a denture cleaner.  Rinse your mouth with warm salt-water many times a day. To make the salt-water mixture, completely dissolve 1/2-1 teaspoon of salt in 1 cup of warm water. Contact a doctor if:  Your problems are getting worse.  Your problems do not get better in less than 7 days with treatment.  Your infection is spreading. This may show as white patches on the skin outside of your mouth.  You are nursing your baby and you have redness and pain in the nipples. This information is not intended to replace advice given to you by your health care provider. Make sure you discuss any questions you have with your health care provider. Document Released: 01/14/2010 Document Revised: 07/14/2016 Document Reviewed: 07/14/2016 Elsevier Interactive Patient Education  2017 [employer]  

## 2017-11-06 ENCOUNTER — Other Ambulatory Visit: Payer: Self-pay

## 2017-11-06 DIAGNOSIS — E785 Hyperlipidemia, unspecified: Secondary | ICD-10-CM

## 2017-11-06 DIAGNOSIS — K219 Gastro-esophageal reflux disease without esophagitis: Secondary | ICD-10-CM

## 2017-11-06 MED ORDER — GABAPENTIN 600 MG PO TABS: ORAL_TABLET | ORAL | 1 refills | Status: DC

## 2017-11-06 MED ORDER — PANTOPRAZOLE SODIUM 40 MG PO TBEC: 40.0000 mg | DELAYED_RELEASE_TABLET | Freq: Every day | ORAL | 1 refills | Status: DC

## 2017-11-06 MED ORDER — SIMVASTATIN 40 MG PO TABS: 40.0000 mg | ORAL_TABLET | Freq: Every day | ORAL | 1 refills | Status: DC

## 2017-11-06 MED ORDER — HYDROCHLOROTHIAZIDE 25 MG PO TABS: 25.0000 mg | ORAL_TABLET | Freq: Every day | ORAL | 1 refills | Status: DC

## 2017-11-17 ENCOUNTER — Ambulatory Visit (HOSPITAL_COMMUNITY)
Admission: RE | Admit: 2017-11-17 | Discharge: 2017-11-17 | Disposition: A | Discharge status: Home / Self Care | Payer: Medicare HMO | Source: Ambulatory Visit | Attending: Nurse Practitioner | Admitting: Nurse Practitioner

## 2017-11-17 DIAGNOSIS — E041 Nontoxic single thyroid nodule: Secondary | ICD-10-CM | POA: Diagnosis not present

## 2017-11-17 DIAGNOSIS — R590 Localized enlarged lymph nodes: Secondary | ICD-10-CM

## 2017-11-19 ENCOUNTER — Other Ambulatory Visit: Payer: Self-pay | Admitting: Nurse Practitioner

## 2017-11-19 DIAGNOSIS — E041 Nontoxic single thyroid nodule: Secondary | ICD-10-CM

## 2017-11-19 NOTE — Progress Notes (Unsigned)
Ref general surgeon  

## 2017-11-26 ENCOUNTER — Other Ambulatory Visit: Payer: Medicare HMO

## 2017-11-26 ENCOUNTER — Other Ambulatory Visit: Payer: Self-pay | Admitting: Family

## 2017-11-26 DIAGNOSIS — E559 Vitamin D deficiency, unspecified: Secondary | ICD-10-CM | POA: Diagnosis not present

## 2017-11-26 DIAGNOSIS — E785 Hyperlipidemia, unspecified: Secondary | ICD-10-CM | POA: Diagnosis not present

## 2017-11-26 DIAGNOSIS — R7303 Prediabetes: Secondary | ICD-10-CM

## 2017-11-26 DIAGNOSIS — I10 Essential (primary) hypertension: Secondary | ICD-10-CM

## 2017-11-26 DIAGNOSIS — E8881 Metabolic syndrome: Secondary | ICD-10-CM | POA: Diagnosis not present

## 2017-11-26 DIAGNOSIS — E669 Obesity, unspecified: Secondary | ICD-10-CM

## 2017-11-26 DIAGNOSIS — K219 Gastro-esophageal reflux disease without esophagitis: Secondary | ICD-10-CM

## 2017-11-26 LAB — BAYER DCA HB A1C WAIVED: HB A1C (BAYER DCA - WAIVED): 5.8 % (ref <7.0)

## 2017-11-27 DIAGNOSIS — H25811 Combined forms of age-related cataract, right eye: Secondary | ICD-10-CM | POA: Diagnosis not present

## 2017-11-27 DIAGNOSIS — H35372 Puckering of macula, left eye: Secondary | ICD-10-CM | POA: Diagnosis not present

## 2017-11-27 DIAGNOSIS — H353131 Nonexudative age-related macular degeneration, bilateral, early dry stage: Secondary | ICD-10-CM | POA: Diagnosis not present

## 2017-11-27 DIAGNOSIS — H2512 Age-related nuclear cataract, left eye: Secondary | ICD-10-CM | POA: Diagnosis not present

## 2017-11-27 LAB — LIPID PANEL
Chol/HDL Ratio: 3.7 ratio (ref 0.0–4.4)
Cholesterol, Total: 144 mg/dL (ref 100–199)
HDL: 39 mg/dL — ABNORMAL LOW (ref >39)
LDL Calculated: 72 mg/dL (ref 0–99)
Triglycerides: 166 mg/dL — ABNORMAL HIGH (ref 0–149)
VLDL Cholesterol Cal: 33 mg/dL (ref 5–40)

## 2017-11-27 LAB — CMP14+EGFR
ALT: 10 IU/L (ref 0–32)
AST: 16 IU/L (ref 0–40)
Albumin/Globulin Ratio: 1.6 (ref 1.2–2.2)
Albumin: 3.8 g/dL (ref 3.5–4.8)
Alkaline Phosphatase: 105 IU/L (ref 39–117)
BUN/Creatinine Ratio: 17 (ref 12–28)
BUN: 15 mg/dL (ref 8–27)
Bilirubin Total: <0.2 mg/dL (ref 0.0–1.2)
CO2: 27 mmol/L (ref 20–29)
Calcium: 9.2 mg/dL (ref 8.7–10.3)
Chloride: 104 mmol/L (ref 96–106)
Creatinine, Ser: 0.89 mg/dL (ref 0.57–1.00)
GFR calc Af Amer: 74 mL/min/{1.73_m2} (ref >59)
GFR calc non Af Amer: 65 mL/min/{1.73_m2} (ref >59)
Globulin, Total: 2.4 g/dL (ref 1.5–4.5)
Glucose: 103 mg/dL — ABNORMAL HIGH (ref 65–99)
Potassium: 3.8 mmol/L (ref 3.5–5.2)
Sodium: 147 mmol/L — ABNORMAL HIGH (ref 134–144)
Total Protein: 6.2 g/dL (ref 6.0–8.5)

## 2017-11-27 LAB — VITAMIN D 25 HYDROXY (VIT D DEFICIENCY, FRACTURES): Vit D, 25-Hydroxy: 39.3 ng/mL (ref 30.0–100.0)

## 2017-12-02 DIAGNOSIS — J383 Other diseases of vocal cords: Secondary | ICD-10-CM | POA: Insufficient documentation

## 2017-12-02 DIAGNOSIS — R221 Localized swelling, mass and lump, neck: Secondary | ICD-10-CM | POA: Diagnosis not present

## 2017-12-04 HISTORY — PX: EYE SURGERY: SHX253

## 2017-12-07 ENCOUNTER — Other Ambulatory Visit: Payer: Self-pay | Admitting: Otolaryngology

## 2017-12-07 DIAGNOSIS — R221 Localized swelling, mass and lump, neck: Secondary | ICD-10-CM

## 2017-12-11 ENCOUNTER — Encounter: Payer: Self-pay | Admitting: Family

## 2017-12-11 ENCOUNTER — Ambulatory Visit (INDEPENDENT_AMBULATORY_CARE_PROVIDER_SITE_OTHER): Payer: Medicare HMO | Admitting: Family

## 2017-12-11 VITALS — BP 125/78 | HR 74 | Temp 97.9°F | Ht 62.0 in | Wt 189.2 lb

## 2017-12-11 DIAGNOSIS — I10 Essential (primary) hypertension: Secondary | ICD-10-CM

## 2017-12-11 DIAGNOSIS — E8881 Metabolic syndrome: Secondary | ICD-10-CM

## 2017-12-11 DIAGNOSIS — E669 Obesity, unspecified: Secondary | ICD-10-CM

## 2017-12-11 DIAGNOSIS — E785 Hyperlipidemia, unspecified: Secondary | ICD-10-CM

## 2017-12-11 DIAGNOSIS — K219 Gastro-esophageal reflux disease without esophagitis: Secondary | ICD-10-CM | POA: Diagnosis not present

## 2017-12-11 NOTE — Progress Notes (Signed)
   Subjective:    Patient ID: Charlotte Aguirre, female    DOB: Mar 16, 1946, 72 y.o.   MRN: [id number]  PT presents to the office today for chronic follow up. PT  Hypertension  This is a chronic problem. The current episode started more than 1 year ago. The problem has been resolved since onset. The problem is controlled. Pertinent negatives include no headaches, malaise/fatigue, peripheral edema or shortness of breath. Risk factors for coronary artery disease include diabetes mellitus, dyslipidemia, obesity and sedentary lifestyle. The current treatment provides moderate improvement. There is no history of kidney disease, CAD/MI or heart failure.  Hyperlipidemia  This is a chronic problem. The current episode started more than 1 year ago. The problem is controlled. Recent lipid tests were reviewed and are normal. Exacerbating diseases include obesity. Pertinent negatives include no shortness of breath. Current antihyperlipidemic treatment includes statins. The current treatment provides moderate improvement of lipids. Risk factors for coronary artery disease include diabetes mellitus, dyslipidemia, obesity, a sedentary lifestyle and post-menopausal.  Gastroesophageal Reflux  She complains of belching, coughing and heartburn. This is a chronic problem. The current episode started more than 1 year ago. The problem occurs occasionally. The symptoms are aggravated by lying down. Risk factors include obesity. She has tried a PPI for the symptoms. The treatment provided moderate relief.  Neuropathy PT currently taking Gabapentin BID. States this helps.  Metabolic Syndrome PT's W4O 5.8. Tries to eat low carb and fat diet.    Review of Systems  Constitutional: Negative for malaise/fatigue.  Respiratory: Positive for cough. Negative for shortness of breath.   Gastrointestinal: Positive for heartburn.  Neurological: Negative for headaches.  All other systems reviewed and are negative.      Objective:   Physical Exam  Constitutional: She is oriented to person, place, and time. She appears well-developed and well-nourished. No distress.  HENT:  Head: Normocephalic and atraumatic.  Right Ear: External ear normal.  Left Ear: External ear normal.  Nose: Nose normal.  Mouth/Throat: Oropharynx is clear and moist.  Eyes: Pupils are equal, round, and reactive to light.  Neck: Normal range of motion. Neck supple. No thyromegaly present.  Cardiovascular: Normal rate, regular rhythm, normal heart sounds and intact distal pulses.  No murmur heard. Pulmonary/Chest: Effort normal and breath sounds normal. No respiratory distress. She has no wheezes.  Abdominal: Soft. Bowel sounds are normal. She exhibits no distension. There is no tenderness.  Musculoskeletal: Normal range of motion. She exhibits no edema or tenderness.  Neurological: She is alert and oriented to person, place, and time.  Skin: Skin is warm and dry.  Psychiatric: She has a normal mood and affect. Her behavior is normal. Judgment and thought content normal.  Vitals reviewed.    BP 125/78   Pulse 74   Temp 97.9 F (36.6 C) (Oral)   Ht 5\' 2"  (1.575 m)   Wt 189 lb 3.2 oz (85.8 kg)   BMI 34.61 kg/m      Assessment & Plan:  1. Essential hypertension  2. Gastroesophageal reflux disease without esophagitis  3. Hyperlipidemia with target LDL less than 100  4. Obesity (BMI 30-39.9)  5. Metabolic syndrome   Continue all meds Labs discussed, she got her lab work prior to our visit  Health Maintenance reviewed Diet and exercise encouraged RTO 6 months   Evelina Dun, FNP

## 2017-12-11 NOTE — Patient Instructions (Signed)

## 2017-12-14 DIAGNOSIS — H2512 Age-related nuclear cataract, left eye: Secondary | ICD-10-CM | POA: Diagnosis not present

## 2018-01-07 ENCOUNTER — Ambulatory Visit: Payer: Medicare HMO | Admitting: Family

## 2018-01-08 ENCOUNTER — Encounter: Payer: Self-pay | Admitting: Family

## 2018-01-12 DIAGNOSIS — M25579 Pain in unspecified ankle and joints of unspecified foot: Secondary | ICD-10-CM | POA: Diagnosis not present

## 2018-01-12 DIAGNOSIS — M79671 Pain in right foot: Secondary | ICD-10-CM | POA: Diagnosis not present

## 2018-01-12 DIAGNOSIS — M722 Plantar fascial fibromatosis: Secondary | ICD-10-CM | POA: Diagnosis not present

## 2018-02-01 DIAGNOSIS — M25579 Pain in unspecified ankle and joints of unspecified foot: Secondary | ICD-10-CM | POA: Diagnosis not present

## 2018-02-01 DIAGNOSIS — M79671 Pain in right foot: Secondary | ICD-10-CM | POA: Diagnosis not present

## 2018-02-10 DIAGNOSIS — Z961 Presence of intraocular lens: Secondary | ICD-10-CM | POA: Diagnosis not present

## 2018-03-05 DIAGNOSIS — K529 Noninfective gastroenteritis and colitis, unspecified: Secondary | ICD-10-CM | POA: Diagnosis not present

## 2018-03-11 ENCOUNTER — Ambulatory Visit (INDEPENDENT_AMBULATORY_CARE_PROVIDER_SITE_OTHER): Payer: Medicare HMO | Admitting: Family

## 2018-03-11 ENCOUNTER — Encounter: Payer: Self-pay | Admitting: Family

## 2018-03-11 VITALS — BP 103/57 | HR 78

## 2018-03-11 DIAGNOSIS — J029 Acute pharyngitis, unspecified: Secondary | ICD-10-CM | POA: Diagnosis not present

## 2018-03-11 DIAGNOSIS — B370 Candidal stomatitis: Secondary | ICD-10-CM

## 2018-03-11 LAB — RAPID STREP SCREEN (MED CTR MEBANE ONLY): Strep Gp A Ag, IA W/Reflex: NEGATIVE

## 2018-03-11 LAB — CULTURE, GROUP A STREP

## 2018-03-11 MED ORDER — FIRST-DUKES MOUTHWASH MT SUSP: 10.0000 mL | Freq: Three times a day (TID) | OROMUCOSAL | 1 refills | Status: DC

## 2018-03-11 NOTE — Progress Notes (Signed)
   Subjective:    Patient ID: Charlotte Aguirre, female    DOB: 19-Jun-1946, 72 y.o.   MRN: [id number]  Chief Complaint  Patient presents with  . Oral Pain    tongue pain x 1 week. No white coating.     HPI PT presents to the office today with tongue soreness that started three weeks ago. States she does not eat a lot of acidic foods.   States salty and acidic foods make her tongue sore. Complaining of intermittent sore throat that started last night.    Review of Systems  All other systems reviewed and are negative.      Objective:   Physical Exam  Constitutional: She appears well-developed and well-nourished. No distress.  HENT:  Head: Normocephalic and atraumatic.  Right Ear: External ear normal.  Mouth/Throat: Posterior oropharyngeal erythema present.  Fissures on tongue, no white coating noted  Eyes: Pupils are equal, round, and reactive to light.  Neck: Normal range of motion. Neck supple. No thyromegaly present.  Cardiovascular: Normal rate, regular rhythm, normal heart sounds and intact distal pulses.  No murmur heard. Pulmonary/Chest: Effort normal and breath sounds normal. No respiratory distress. She has no wheezes.  Abdominal: Soft. Bowel sounds are normal. She exhibits no distension. There is no tenderness.  Musculoskeletal: Normal range of motion. She exhibits no edema or tenderness.  Neurological: She is alert. She has normal reflexes.  Skin: Skin is warm and dry.  Psychiatric: She has a normal mood and affect. Her behavior is normal. Judgment and thought content normal.  Vitals reviewed.     BP (!) 103/57 (BP Location: Left Arm, Patient Position: Sitting, Cuff Size: Large)   Pulse 78      Assessment & Plan:  [name] was seen today for oral pain.  Diagnoses and all orders for this visit:  Sore throat -     Rapid Strep Screen ([facility] & [facility] ONLY)  Oral candidiasis -     Diphenhyd-Hydrocort-Nystatin (FIRST-DUKES MOUTHWASH) SUSP; Use as directed 10 mLs in the  mouth or throat 4 (four) times daily - after meals and at bedtime.    Will order Dukes magic mouthwash Clean dentures well RTO prn   Evelina Dun, FNP

## 2018-03-11 NOTE — Patient Instructions (Signed)
Oral Thrush, Adult Oral thrush, also called oral candidiasis, is a fungal infection that develops in the mouth and throat and on the tongue. It causes white patches to form on the mouth and tongue. Thrush is most common in older adults, but it can occur at any age. Many cases of thrush are mild, but this infection can also be serious. Thrush can be a repeated (recurrent) problem for certain people who have a weak body defense system (immune system). The weakness can be caused by chronic illnesses, or by taking medicines that limit the body's ability to fight infection. If a person has difficulty fighting infection, the fungus that causes thrush can spread through the body. This can cause life-threatening blood or organ infections. What are the causes? This condition is caused by a fungus (yeast) called Candida albicans.  This fungus is normally present in small amounts in the mouth and on other mucous membranes. It usually causes no harm.  If conditions are present that allow the fungus to grow without control, it invades surrounding tissues and becomes an infection.  Other Candida species can also lead to thrush (rare).  What increases the risk? This condition is more likely to develop in:  People with a weakened immune system.  Older adults.  People with HIV (human immunodeficiency virus).  People with diabetes.  People with dry mouth (xerostomia).  Pregnant women.  People with poor dental care, especially people who have false teeth.  People who use antibiotic medicines.  What are the signs or symptoms? Symptoms of this condition can vary from mild and moderate to severe and persistent. Symptoms may include:  A burning feeling in the mouth and throat. This can occur at the start of a thrush infection.  White patches that stick to the mouth and tongue. The tissue around the patches may be red, raw, and painful. If rubbed (during tooth brushing, for example), the patches and the  tissue of the mouth may bleed easily.  A bad taste in the mouth or difficulty tasting foods.  A cottony feeling in the mouth.  Pain during eating and swallowing.  Poor appetite.  Cracking at the corners of the mouth.  How is this diagnosed? This condition is diagnosed based on:  Physical exam. Your health care provider will look in your mouth.  Health history. Your health care provider will ask you questions about your health.  How is this treated? This condition is treated with medicines called antifungals, which prevent the growth of fungi. These medicines are either applied directly to the affected area (topical) or swallowed (oral). The treatment will depend on the severity of the condition. Mild thrush Mild cases of thrush may clear up with the use of an antifungal mouth rinse or lozenges. Treatment usually lasts about 14 days. Moderate to severe thrush  More severe thrush infections that have spread to the esophagus are treated with an oral antifungal medicine. A topical antifungal medicine may also be used.  For some severe infections, treatment may need to continue for more than 14 days.  Oral antifungal medicines are rarely used during pregnancy because they may be harmful to the unborn child. If you are pregnant, talk with your health care provider about options for treatment. Persistent or recurrent thrush For cases of thrush that do not go away or keep coming back:  Treatment may be needed twice as long as the symptoms last.  Treatment will include both oral and topical antifungal medicines.  People with a weakened immune   system can take an antifungal medicine on a continuous basis to prevent thrush infections.  It is important to treat conditions that make a person more likely to get thrush, such as diabetes or HIV. Follow these instructions at home: Medicines  Take over-the-counter and prescription medicines only as told by your health care provider.  Talk  with your health care provider about an over-the-counter medicine called gentian violet, which kills bacteria and fungi. Relieving soreness and discomfort To help reduce the discomfort of thrush:  Drink cold liquids such as water or iced tea.  Try flavored ice treats or frozen juices.  Eat foods that are easy to swallow, such as gelatin, ice cream, or custard.  Try drinking from a straw if the patches in your mouth are painful.  General instructions  Eat plain, unflavored yogurt as directed by your health care provider. Check the label to make sure the yogurt contains live cultures. This yogurt can help healthy bacteria to grow in the mouth and can stop the growth of the fungus that causes thrush.  If you wear dentures, remove the dentures before going to bed, brush them vigorously, and soak them in a cleaning solution as directed by your health care provider.  Rinse your mouth with a warm salt-water mixture several times a day. To make a salt-water mixture, completely dissolve 1/2-1 tsp of salt in 1 cup of warm water. Contact a health care provider if:  Your symptoms are getting worse or are not improving within 7 days of starting treatment.  You have symptoms of a spreading infection, such as white patches on the skin outside of the mouth. This information is not intended to replace advice given to you by your health care provider. Make sure you discuss any questions you have with your health care provider. Document Released: 07/15/2004 Document Revised: 07/14/2016 Document Reviewed: 07/14/2016 Elsevier Interactive Patient Education  2017 [employer] Inc[employer]  

## 2018-04-08 DIAGNOSIS — M79671 Pain in right foot: Secondary | ICD-10-CM | POA: Diagnosis not present

## 2018-04-08 DIAGNOSIS — M7662 Achilles tendinitis, left leg: Secondary | ICD-10-CM | POA: Diagnosis not present

## 2018-04-08 DIAGNOSIS — G5791 Unspecified mononeuropathy of right lower limb: Secondary | ICD-10-CM | POA: Diagnosis not present

## 2018-04-26 ENCOUNTER — Encounter: Payer: Self-pay | Admitting: Family

## 2018-04-26 ENCOUNTER — Ambulatory Visit (INDEPENDENT_AMBULATORY_CARE_PROVIDER_SITE_OTHER): Payer: Medicare HMO | Admitting: Family

## 2018-04-26 VITALS — BP 130/75 | HR 69 | Temp 98.4°F | Ht 62.0 in | Wt 188.6 lb

## 2018-04-26 DIAGNOSIS — K5900 Constipation, unspecified: Secondary | ICD-10-CM

## 2018-04-26 DIAGNOSIS — M545 Low back pain, unspecified: Secondary | ICD-10-CM

## 2018-04-26 MED ORDER — LINACLOTIDE 72 MCG PO CAPS: 72.0000 ug | ORAL_CAPSULE | Freq: Every day | ORAL | 3 refills | Status: DC

## 2018-04-26 MED ORDER — PREDNISONE 10 MG (21) PO TBPK: ORAL_TABLET | ORAL | 0 refills | Status: DC

## 2018-04-26 MED ORDER — DICLOFENAC SODIUM 75 MG PO TBEC: 75.0000 mg | DELAYED_RELEASE_TABLET | Freq: Two times a day (BID) | ORAL | 0 refills | Status: DC

## 2018-04-26 NOTE — Patient Instructions (Signed)

## 2018-04-26 NOTE — Progress Notes (Signed)
   Subjective:    Patient ID: Charlotte Aguirre, female    DOB: 31-Jan-1946, 72 y.o.   MRN: [id number]  Chief Complaint  Patient presents with  . Back Pain  . Constipation    Back Pain  This is a recurrent problem. The current episode started 1 to 4 weeks ago. The problem occurs intermittently. The problem has been waxing and waning since onset. The pain is present in the lumbar spine. The quality of the pain is described as aching. The pain does not radiate. The pain is at a severity of 6/10. The pain is moderate. The symptoms are aggravated by bending and standing. Pertinent negatives include no bladder incontinence, bowel incontinence, leg pain, tingling or weakness.  Constipation  This is a chronic problem. The current episode started more than 1 year ago. The problem has been waxing and waning since onset. Her stool frequency is 2 to 3 times per week. Associated symptoms include back pain. She has tried laxatives and stool softeners for the symptoms. The treatment provided mild relief.      Review of Systems  Gastrointestinal: Positive for constipation. Negative for bowel incontinence.  Genitourinary: Negative for bladder incontinence.  Musculoskeletal: Positive for back pain.  Neurological: Negative for tingling and weakness.  All other systems reviewed and are negative.      Objective:   Physical Exam  Constitutional: She is oriented to person, place, and time. She appears well-developed and well-nourished. No distress.  HENT:  Head: Normocephalic and atraumatic.  Right Ear: External ear normal.  Left Ear: External ear normal.  Mouth/Throat: Oropharynx is clear and moist.  Eyes: Pupils are equal, round, and reactive to light.  Neck: Normal range of motion. Neck supple. No thyromegaly present.  Cardiovascular: Normal rate, regular rhythm, normal heart sounds and intact distal pulses.  No murmur heard. Pulmonary/Chest: Effort normal and breath sounds normal. No respiratory  distress. She has no wheezes.  Abdominal: Soft. Bowel sounds are normal. She exhibits no distension. There is no tenderness.  Musculoskeletal: Normal range of motion. She exhibits no edema or tenderness.  Left lower back with flexion or extension  Neurological: She is alert and oriented to person, place, and time. She has normal reflexes. No cranial nerve deficit.  Skin: Skin is warm and dry.  Psychiatric: She has a normal mood and affect. Her behavior is normal. Judgment and thought content normal.  Vitals reviewed.     BP 130/75   Pulse 69   Temp 98.4 F (36.9 C) (Oral)   Ht 5\' 2"  (1.575 m)   Wt 188 lb 9.6 oz (85.5 kg)   BMI 34.50 kg/m      Assessment & Plan:  Charlotte Aguirre comes in today with chief complaint of Back Pain and Constipation   Diagnosis and orders addressed:  1. Acute left-sided low back pain without sciatica Rest Ice  ROM exercises RTO if symptoms worsen or do not improve - predniSONE (STERAPRED UNI-PAK 21 TAB) 10 MG (21) TBPK tablet; Use as directed  Dispense: 21 tablet; Refill: 0 - diclofenac (VOLTAREN) 75 MG EC tablet; Take 1 tablet (75 mg total) by mouth 2 (two) times daily.  Dispense: 30 tablet; Refill: 0  2. Constipation, unspecified constipation type Force fluids Start Linzess if cost effective Encourage healy diet - linaclotide (LINZESS) 72 MCG capsule; Take 1 capsule (72 mcg total) by mouth daily before breakfast.  Dispense: 30 capsule; Refill: Dwight, FNP

## 2018-05-08 ENCOUNTER — Other Ambulatory Visit: Payer: Self-pay | Admitting: Family

## 2018-05-08 DIAGNOSIS — E785 Hyperlipidemia, unspecified: Secondary | ICD-10-CM

## 2018-05-08 DIAGNOSIS — K219 Gastro-esophageal reflux disease without esophagitis: Secondary | ICD-10-CM

## 2018-05-11 DIAGNOSIS — M79671 Pain in right foot: Secondary | ICD-10-CM | POA: Diagnosis not present

## 2018-05-11 DIAGNOSIS — M76821 Posterior tibial tendinitis, right leg: Secondary | ICD-10-CM | POA: Diagnosis not present

## 2018-06-01 ENCOUNTER — Other Ambulatory Visit: Payer: Self-pay | Admitting: Family

## 2018-06-03 ENCOUNTER — Telehealth: Payer: Self-pay | Admitting: Family

## 2018-06-03 DIAGNOSIS — E669 Obesity, unspecified: Secondary | ICD-10-CM | POA: Diagnosis not present

## 2018-06-03 DIAGNOSIS — K219 Gastro-esophageal reflux disease without esophagitis: Secondary | ICD-10-CM | POA: Diagnosis not present

## 2018-06-03 DIAGNOSIS — Z6834 Body mass index (BMI) 34.0-34.9, adult: Secondary | ICD-10-CM | POA: Diagnosis not present

## 2018-06-03 DIAGNOSIS — Z809 Family history of malignant neoplasm, unspecified: Secondary | ICD-10-CM | POA: Diagnosis not present

## 2018-06-03 DIAGNOSIS — I10 Essential (primary) hypertension: Secondary | ICD-10-CM | POA: Diagnosis not present

## 2018-06-03 DIAGNOSIS — Z7982 Long term (current) use of aspirin: Secondary | ICD-10-CM | POA: Diagnosis not present

## 2018-06-03 DIAGNOSIS — E785 Hyperlipidemia, unspecified: Secondary | ICD-10-CM | POA: Diagnosis not present

## 2018-06-03 DIAGNOSIS — J309 Allergic rhinitis, unspecified: Secondary | ICD-10-CM | POA: Diagnosis not present

## 2018-06-03 DIAGNOSIS — Z8249 Family history of ischemic heart disease and other diseases of the circulatory system: Secondary | ICD-10-CM | POA: Diagnosis not present

## 2018-06-03 DIAGNOSIS — G629 Polyneuropathy, unspecified: Secondary | ICD-10-CM | POA: Diagnosis not present

## 2018-06-07 ENCOUNTER — Ambulatory Visit (INDEPENDENT_AMBULATORY_CARE_PROVIDER_SITE_OTHER): Payer: Medicare HMO

## 2018-06-07 ENCOUNTER — Ambulatory Visit (INDEPENDENT_AMBULATORY_CARE_PROVIDER_SITE_OTHER): Payer: Medicare HMO | Admitting: Family

## 2018-06-07 ENCOUNTER — Encounter: Payer: Self-pay | Admitting: Family

## 2018-06-07 VITALS — BP 134/75 | HR 74 | Temp 98.9°F | Ht 62.0 in | Wt 186.0 lb

## 2018-06-07 DIAGNOSIS — R197 Diarrhea, unspecified: Secondary | ICD-10-CM

## 2018-06-07 DIAGNOSIS — I10 Essential (primary) hypertension: Secondary | ICD-10-CM

## 2018-06-07 DIAGNOSIS — K219 Gastro-esophageal reflux disease without esophagitis: Secondary | ICD-10-CM

## 2018-06-07 DIAGNOSIS — R1011 Right upper quadrant pain: Secondary | ICD-10-CM

## 2018-06-07 DIAGNOSIS — R109 Unspecified abdominal pain: Secondary | ICD-10-CM | POA: Diagnosis not present

## 2018-06-07 DIAGNOSIS — K581 Irritable bowel syndrome with constipation: Secondary | ICD-10-CM | POA: Diagnosis not present

## 2018-06-07 MED ORDER — HYDROCHLOROTHIAZIDE 25 MG PO TABS: 25.0000 mg | ORAL_TABLET | Freq: Every day | ORAL | 1 refills | Status: DC

## 2018-06-07 NOTE — Progress Notes (Signed)
Subjective:    Patient ID: Charlotte Aguirre, female    DOB: January 16, 1946, 72 y.o.   MRN: [id number]  Chief Complaint  Patient presents with  . Abdominal Pain    Abdominal Pain  This is a new problem. The current episode started in the past 7 days. The onset quality is gradual. The problem occurs constantly. The problem has been rapidly worsening. The pain is located in the RUQ. The pain is at a severity of 10/10. The pain is moderate. The quality of the pain is sharp and burning. The abdominal pain does not radiate. Associated symptoms include diarrhea and nausea. Pertinent negatives include no constipation, dysuria, flatus, frequency, hematuria or vomiting. The pain is aggravated by eating. Relieved by: milk. She has tried antacids and proton pump inhibitors for the symptoms. The treatment provided mild relief. Her past medical history is significant for GERD.  Gastroesophageal Reflux  She complains of abdominal pain, heartburn and nausea. This is a chronic problem. The current episode started more than 1 year ago. The problem occurs occasionally. She has tried a PPI for the symptoms. The treatment provided mild relief.  Diarrhea   This is a recurrent problem. The current episode started yesterday. The problem occurs 5 to 10 times per day. The problem has been waxing and waning. Associated symptoms include abdominal pain. Pertinent negatives include no increased  flatus or vomiting.      Review of Systems  Gastrointestinal: Positive for abdominal pain, diarrhea, heartburn and nausea. Negative for constipation, flatus and vomiting.  Genitourinary: Negative for dysuria, frequency and hematuria.  All other systems reviewed and are negative.      Objective:   Physical Exam  Constitutional: She is oriented to person, place, and time. She appears well-developed and well-nourished. No distress.  HENT:  Head: Normocephalic and atraumatic.  Right Ear: External ear normal.  Mouth/Throat: Oropharynx  is clear and moist.  Eyes: Pupils are equal, round, and reactive to light.  Neck: Normal range of motion. Neck supple. No thyromegaly present.  Cardiovascular: Normal rate, regular rhythm, normal heart sounds and intact distal pulses.  No murmur heard. Pulmonary/Chest: Effort normal and breath sounds normal. No respiratory distress. She has no wheezes.  Abdominal: Soft. Bowel sounds are normal. She exhibits no distension. There is tenderness (mild) in the right upper quadrant.  Musculoskeletal: Normal range of motion. She exhibits no edema or tenderness.  Neurological: She is alert and oriented to person, place, and time. She has normal reflexes. No cranial nerve deficit.  Skin: Skin is warm and dry.  Psychiatric: She has a normal mood and affect. Her behavior is normal. Judgment and thought content normal.  Vitals reviewed.     BP 134/75   Pulse 74   Temp 98.9 F (37.2 C) (Oral)   Ht 5' 2"  (1.575 m)   Wt 186 lb (84.4 kg)   BMI 34.02 kg/m      Assessment & Plan:  Chauncey Cruel Gadson comes in today with chief complaint of Abdominal Pain   Diagnosis and orders addressed:  1. Essential hypertension - CMP14+EGFR - CBC with Differential/Platelet - hydrochlorothiazide (HYDRODIURIL) 25 MG tablet; Take 1 tablet (25 mg total) by mouth daily.  Dispense: 90 tablet; Refill: 1  2. Irritable bowel syndrome with constipation - CMP14+EGFR - CBC with Differential/Platelet - Ambulatory referral to Gastroenterology  3. Gastroesophageal reflux disease without esophagitis - CMP14+EGFR - CBC with Differential/Platelet - Ambulatory referral to Gastroenterology - DG Abd 1 View; Future  4. Right upper  quadrant abdominal pain - CMP14+EGFR - CBC with Differential/Platelet - H Pylori, IGM, IGG, IGA AB - Ambulatory referral to Gastroenterology - DG Abd 1 View; Future  5. Diarrhea, unspecified type - DG Abd 1 View; Future   Labs pending, No NSAID's, continue PPI Diet and exercise  encouraged  Follow up plan: As needed   Evelina Dun, FNP

## 2018-06-07 NOTE — Patient Instructions (Signed)

## 2018-06-08 LAB — H PYLORI, IGM, IGG, IGA AB
H pylori, IgM Abs: <9 units (ref 0.0–8.9)
H. pylori, IgA Abs: <9 units (ref 0.0–8.9)
H. pylori, IgG AbS: <0.8 Index Value (ref 0.00–0.79)

## 2018-06-08 LAB — CBC WITH DIFFERENTIAL/PLATELET
Basophils Absolute: 0.1 10*3/uL (ref 0.0–0.2)
Basos: 1 %
EOS (ABSOLUTE): 0.2 10*3/uL (ref 0.0–0.4)
Eos: 2 %
Hematocrit: 43 % (ref 34.0–46.6)
Hemoglobin: 14.3 g/dL (ref 11.1–15.9)
Immature Grans (Abs): 0 10*3/uL (ref 0.0–0.1)
Immature Granulocytes: 0 %
Lymphocytes Absolute: 2.9 10*3/uL (ref 0.7–3.1)
Lymphs: 33 %
MCH: 27.6 pg (ref 26.6–33.0)
MCHC: 33.3 g/dL (ref 31.5–35.7)
MCV: 83 fL (ref 79–97)
Monocytes Absolute: 0.5 10*3/uL (ref 0.1–0.9)
Monocytes: 5 %
Neutrophils Absolute: 5.1 10*3/uL (ref 1.4–7.0)
Neutrophils: 59 %
Platelets: 282 10*3/uL (ref 150–450)
RBC: 5.19 x10E6/uL (ref 3.77–5.28)
RDW: 14.6 % (ref 12.3–15.4)
WBC: 8.7 10*3/uL (ref 3.4–10.8)

## 2018-06-08 LAB — CMP14+EGFR
ALT: 13 IU/L (ref 0–32)
AST: 18 IU/L (ref 0–40)
Albumin/Globulin Ratio: 1.8 (ref 1.2–2.2)
Albumin: 4.2 g/dL (ref 3.5–4.8)
Alkaline Phosphatase: 126 IU/L — ABNORMAL HIGH (ref 39–117)
BUN/Creatinine Ratio: 19 (ref 12–28)
BUN: 15 mg/dL (ref 8–27)
Bilirubin Total: 0.3 mg/dL (ref 0.0–1.2)
CO2: 29 mmol/L (ref 20–29)
Calcium: 9.7 mg/dL (ref 8.7–10.3)
Chloride: 99 mmol/L (ref 96–106)
Creatinine, Ser: 0.8 mg/dL (ref 0.57–1.00)
GFR calc Af Amer: 85 mL/min/{1.73_m2} (ref >59)
GFR calc non Af Amer: 73 mL/min/{1.73_m2} (ref >59)
Globulin, Total: 2.3 g/dL (ref 1.5–4.5)
Glucose: 107 mg/dL — ABNORMAL HIGH (ref 65–99)
Potassium: 3.8 mmol/L (ref 3.5–5.2)
Sodium: 145 mmol/L — ABNORMAL HIGH (ref 134–144)
Total Protein: 6.5 g/dL (ref 6.0–8.5)

## 2018-06-17 DIAGNOSIS — M7661 Achilles tendinitis, right leg: Secondary | ICD-10-CM | POA: Diagnosis not present

## 2018-06-17 DIAGNOSIS — M79671 Pain in right foot: Secondary | ICD-10-CM | POA: Diagnosis not present

## 2018-06-29 ENCOUNTER — Ambulatory Visit (INDEPENDENT_AMBULATORY_CARE_PROVIDER_SITE_OTHER): Payer: Medicare HMO | Admitting: Internal Medicine

## 2018-06-29 ENCOUNTER — Encounter (INDEPENDENT_AMBULATORY_CARE_PROVIDER_SITE_OTHER): Payer: Self-pay | Admitting: Internal Medicine

## 2018-06-29 ENCOUNTER — Encounter (INDEPENDENT_AMBULATORY_CARE_PROVIDER_SITE_OTHER): Payer: Self-pay | Admitting: *Deleted

## 2018-06-29 VITALS — BP 162/70 | HR 72 | Temp 97.9°F | Ht 62.0 in | Wt 181.1 lb

## 2018-06-29 DIAGNOSIS — K5900 Constipation, unspecified: Secondary | ICD-10-CM

## 2018-06-29 DIAGNOSIS — R1011 Right upper quadrant pain: Secondary | ICD-10-CM | POA: Diagnosis not present

## 2018-06-29 NOTE — Patient Instructions (Addendum)
US abdomen Take the Linzess every 2 days

## 2018-06-29 NOTE — Progress Notes (Signed)
Subjective:    Patient ID: Charlotte Aguirre, female    DOB: May 15, 1946, 72 y.o.   MRN: [id number]  HPI  Referred for RUQ pain.  She says several weeks ago she had excruciating pain. She says she had been eating a lot of tomatoes and she began to have the pain. The pain lasted a few minutes. She drank chocolate milk and the pain calmed down.     Last seen in March of 2018. Hx of IBS.  Liver enzymes were basically normal 89/03/2018.  H. Pylori normal.  She says if she doesn't take the Protonix she will have GERD.  Her appetite is good. No weight loss. She tells me she has had some constipation. She was placed on Linzess but developed diarrhea. She stopped the Linzess.   06/07/2018 DG Abd: change in bowel habits: No acute process.  CBC    Component Value Date/Time   WBC 8.7 06/07/2018 1154   WBC 8.8 04/03/2015 1627   WBC 10.1 10/31/2010 1725   RBC 5.19 06/07/2018 1154   RBC 5.18 04/03/2015 1627   RBC 5.24 (H) 10/31/2010 1725   HGB 14.3 06/07/2018 1154   HCT 43.0 06/07/2018 1154   PLT 282 06/07/2018 1154   MCV 83 06/07/2018 1154   MCH 27.6 06/07/2018 1154   MCH 26.1 (A) 04/03/2015 1627   MCH 26.3 10/31/2010 1725   MCHC 33.3 06/07/2018 1154   MCHC 31.4 (A) 04/03/2015 1627   MCHC 33.5 10/31/2010 1725   RDW 14.6 06/07/2018 1154   LYMPHSABS 2.9 06/07/2018 1154   MONOABS 0.2 10/31/2010 1725   EOSABS 0.2 06/07/2018 1154   BASOSABS 0.1 06/07/2018 1154      CMP Latest Ref Rng & Units 06/07/2018 11/26/2017 04/28/2017  Glucose 65 - 99 mg/dL 107(H) 103(H) 96  BUN 8 - 27 mg/dL 15 15 12   Creatinine 0.57 - 1.00 mg/dL 0.80 0.89 0.75  Sodium 134 - 144 mmol/L 145(H) 147(H) 145(H)  Potassium 3.5 - 5.2 mmol/L 3.8 3.8 4.3  Chloride 96 - 106 mmol/L 99 104 103  CO2 20 - 29 mmol/L 29 27 28   Calcium 8.7 - 10.3 mg/dL 9.7 9.2 9.4  Total Protein 6.0 - 8.5 g/dL 6.5 6.2 6.2  Total Bilirubin 0.0 - 1.2 mg/dL 0.3 <0.2 0.2  Alkaline Phos 39 - 117 IU/L 126(H) 105 101  AST 0 - 40 IU/L 18 16 16   ALT 0 - 32 IU/L 13  10 10      03/22/2014 Colonoscopy  Indications: Patient is 72 year old Caucasian female was undergoing average risk screening colonoscopy. Her last exam was about 14 years ago. Small polyp ablated via cold biopsy from splenic flexure. Mild sigmoid colon diverticulosis.  Incidental finding of small rectal AV malformation.  Biopsy results reviewed with patient. Patient had small polyp removed his tubular adenoma Next colonoscopy in 10 years.  Review of Systems Past Medical History:  Diagnosis Date  . Arthritis    thumb and fingers  . Cataract   . Fibromyalgia   . GERD (gastroesophageal reflux disease)   . Headache(784.0)   . Hyperlipidemia   . Hypertension   . IBS (irritable bowel syndrome)   . Memory changes   . Osteopenia   . Peripheral neuropathy   . Ulcer    gastric  . Vitamin D insufficiency   . Wrist fracture 2013    Past Surgical History:  Procedure Laterality Date  . ABDOMINAL HYSTERECTOMY     uterus frist and 10 years later both ovaries removed  .  CHOLECYSTECTOMY    . COLONOSCOPY N/A 03/22/2014   Procedure: COLONOSCOPY;  Surgeon: Rogene Houston, MD;  Location: [facility];  Service: Endoscopy;  Laterality: N/A;  930  . OOPHORECTOMY      Allergies  Allergen Reactions  . Aspirin     Must take EC, prior history of stomach ulcer. Other reaction(s): Angioedema (ALLERGY/intolerance) Must take EC, prior history of stomach ulcer.  . Topiramate Other (See Comments)    hallucinations    Current Outpatient Medications on File Prior to Visit  Medication Sig Dispense Refill  . aspirin 81 MG EC tablet Take 81 mg by mouth daily. Swallow whole.    . Cholecalciferol (VITAMIN D-3 PO) Take 1 tablet by mouth 2 (two) times daily. 1000mg     . fluticasone (FLONASE) 50 MCG/ACT nasal spray Place 2 sprays into both nostrils daily. 16 g 6  . gabapentin (NEURONTIN) 600 MG tablet TAKE 2 TABLETS BY MOUTH EACH MORNING AND 3 TABLETS EACH EVENING 450 tablet 0  .  hydrochlorothiazide (HYDRODIURIL) 25 MG tablet Take 1 tablet (25 mg total) by mouth daily. 90 tablet 1  . linaclotide (LINZESS) 72 MCG capsule Take 1 capsule (72 mcg total) by mouth daily before breakfast. 30 capsule 3  . LUTEIN PO Take 1 tablet by mouth daily.    . Multiple Vitamins-Minerals (MULTIVITAMINS THER. W/MINERALS) TABS tablet Take 1 tablet by mouth daily.    . multivitamin-lutein (OCUVITE-LUTEIN) CAPS capsule Take by mouth.    . pantoprazole (PROTONIX) 40 MG tablet TAKE 1 TABLET BY MOUTH EVERY DAY 90 tablet 0  . simvastatin (ZOCOR) 40 MG tablet TAKE 1 TABLET BY MOUTH EVERY DAY 90 tablet 0   No current facility-administered medications on file prior to visit.         Objective:   Physical Exam Blood pressure (!) 162/70, pulse 72, temperature 97.9 F (36.6 C), height 5\' 2"  (1.575 m), weight 181 lb 1.6 oz (82.1 kg). Alert and oriented. Skin warm and dry. Oral mucosa is moist.   . Sclera anicteric, conjunctivae is pink. Thyroid not enlarged. No cervical lymphadenopathy. Lungs clear. Heart regular rate and rhythm.  Abdomen is soft. Bowel sounds are positive. No hepatomegaly. No abdominal masses felt. No tenderness.  No edema to lower extremities.            Assessment & Plan:  Constipation: Take the Linzess every 2 days.  RUQ pain: Am going to get an US abdomen to make sure her CBD is not dilated.

## 2018-07-02 ENCOUNTER — Ambulatory Visit (HOSPITAL_COMMUNITY)
Admission: RE | Admit: 2018-07-02 | Discharge: 2018-07-02 | Disposition: A | Discharge status: Home / Self Care | Payer: Medicare HMO | Source: Ambulatory Visit | Attending: Internal Medicine | Admitting: Internal Medicine

## 2018-07-02 DIAGNOSIS — Z9049 Acquired absence of other specified parts of digestive tract: Secondary | ICD-10-CM | POA: Insufficient documentation

## 2018-07-02 DIAGNOSIS — R1011 Right upper quadrant pain: Secondary | ICD-10-CM | POA: Diagnosis not present

## 2018-07-02 DIAGNOSIS — D1803 Hemangioma of intra-abdominal structures: Secondary | ICD-10-CM | POA: Insufficient documentation

## 2018-07-06 ENCOUNTER — Other Ambulatory Visit (INDEPENDENT_AMBULATORY_CARE_PROVIDER_SITE_OTHER): Payer: Self-pay | Admitting: Internal Medicine

## 2018-07-06 DIAGNOSIS — K219 Gastro-esophageal reflux disease without esophagitis: Secondary | ICD-10-CM

## 2018-07-06 MED ORDER — PANTOPRAZOLE SODIUM 40 MG PO TBEC: 40.0000 mg | DELAYED_RELEASE_TABLET | Freq: Two times a day (BID) | ORAL | 3 refills | Status: DC

## 2018-07-07 DIAGNOSIS — M7671 Peroneal tendinitis, right leg: Secondary | ICD-10-CM | POA: Diagnosis not present

## 2018-07-07 DIAGNOSIS — S96811A Strain of other specified muscles and tendons at ankle and foot level, right foot, initial encounter: Secondary | ICD-10-CM | POA: Diagnosis not present

## 2018-07-14 DIAGNOSIS — H01021 Squamous blepharitis right upper eyelid: Secondary | ICD-10-CM | POA: Diagnosis not present

## 2018-07-14 DIAGNOSIS — H01024 Squamous blepharitis left upper eyelid: Secondary | ICD-10-CM | POA: Diagnosis not present

## 2018-07-14 DIAGNOSIS — H01022 Squamous blepharitis right lower eyelid: Secondary | ICD-10-CM | POA: Diagnosis not present

## 2018-07-14 DIAGNOSIS — H01025 Squamous blepharitis left lower eyelid: Secondary | ICD-10-CM | POA: Diagnosis not present

## 2018-07-14 DIAGNOSIS — H00024 Hordeolum internum left upper eyelid: Secondary | ICD-10-CM | POA: Diagnosis not present

## 2018-07-15 DIAGNOSIS — M7662 Achilles tendinitis, left leg: Secondary | ICD-10-CM | POA: Diagnosis not present

## 2018-07-15 DIAGNOSIS — M79672 Pain in left foot: Secondary | ICD-10-CM | POA: Diagnosis not present

## 2018-08-03 ENCOUNTER — Other Ambulatory Visit: Payer: Self-pay | Admitting: Family

## 2018-08-03 DIAGNOSIS — H353131 Nonexudative age-related macular degeneration, bilateral, early dry stage: Secondary | ICD-10-CM | POA: Diagnosis not present

## 2018-08-03 DIAGNOSIS — H25811 Combined forms of age-related cataract, right eye: Secondary | ICD-10-CM | POA: Diagnosis not present

## 2018-08-03 DIAGNOSIS — H35372 Puckering of macula, left eye: Secondary | ICD-10-CM | POA: Diagnosis not present

## 2018-08-03 DIAGNOSIS — K219 Gastro-esophageal reflux disease without esophagitis: Secondary | ICD-10-CM

## 2018-08-03 DIAGNOSIS — H00024 Hordeolum internum left upper eyelid: Secondary | ICD-10-CM | POA: Diagnosis not present

## 2018-08-04 ENCOUNTER — Other Ambulatory Visit: Payer: Self-pay | Admitting: Family

## 2018-08-04 DIAGNOSIS — E785 Hyperlipidemia, unspecified: Secondary | ICD-10-CM

## 2018-08-30 DIAGNOSIS — D225 Melanocytic nevi of trunk: Secondary | ICD-10-CM | POA: Diagnosis not present

## 2018-08-30 DIAGNOSIS — X32XXXA Exposure to sunlight, initial encounter: Secondary | ICD-10-CM | POA: Diagnosis not present

## 2018-08-30 DIAGNOSIS — Z1283 Encounter for screening for malignant neoplasm of skin: Secondary | ICD-10-CM | POA: Diagnosis not present

## 2018-08-30 DIAGNOSIS — L570 Actinic keratosis: Secondary | ICD-10-CM | POA: Diagnosis not present

## 2018-08-31 ENCOUNTER — Ambulatory Visit (INDEPENDENT_AMBULATORY_CARE_PROVIDER_SITE_OTHER): Payer: Medicare HMO

## 2018-08-31 ENCOUNTER — Other Ambulatory Visit: Payer: Self-pay | Admitting: Family

## 2018-08-31 DIAGNOSIS — E785 Hyperlipidemia, unspecified: Secondary | ICD-10-CM

## 2018-08-31 DIAGNOSIS — Z23 Encounter for immunization: Secondary | ICD-10-CM | POA: Diagnosis not present

## 2018-08-31 NOTE — Telephone Encounter (Signed)
Last lipid 11/26/17

## 2018-09-14 DIAGNOSIS — Z1231 Encounter for screening mammogram for malignant neoplasm of breast: Secondary | ICD-10-CM | POA: Diagnosis not present

## 2018-09-14 LAB — HM MAMMOGRAPHY

## 2018-09-24 ENCOUNTER — Other Ambulatory Visit: Payer: Self-pay | Admitting: Family

## 2018-09-24 DIAGNOSIS — E785 Hyperlipidemia, unspecified: Secondary | ICD-10-CM

## 2018-09-24 NOTE — Telephone Encounter (Signed)
[facility]. NTBS 30 days given 08/31/18

## 2018-09-24 NOTE — Telephone Encounter (Signed)
[id number]-cb 11/22

## 2018-09-26 DIAGNOSIS — E7800 Pure hypercholesterolemia, unspecified: Secondary | ICD-10-CM | POA: Diagnosis not present

## 2018-09-26 DIAGNOSIS — R1032 Left lower quadrant pain: Secondary | ICD-10-CM | POA: Diagnosis not present

## 2018-09-26 DIAGNOSIS — I10 Essential (primary) hypertension: Secondary | ICD-10-CM | POA: Diagnosis not present

## 2018-09-26 DIAGNOSIS — Z79899 Other long term (current) drug therapy: Secondary | ICD-10-CM | POA: Diagnosis not present

## 2018-09-26 DIAGNOSIS — K5790 Diverticulosis of intestine, part unspecified, without perforation or abscess without bleeding: Secondary | ICD-10-CM | POA: Diagnosis not present

## 2018-09-26 DIAGNOSIS — Z6834 Body mass index (BMI) 34.0-34.9, adult: Secondary | ICD-10-CM | POA: Diagnosis not present

## 2018-09-26 DIAGNOSIS — E669 Obesity, unspecified: Secondary | ICD-10-CM | POA: Diagnosis not present

## 2018-09-26 DIAGNOSIS — Z7952 Long term (current) use of systemic steroids: Secondary | ICD-10-CM | POA: Diagnosis not present

## 2018-09-26 DIAGNOSIS — Z87891 Personal history of nicotine dependence: Secondary | ICD-10-CM | POA: Diagnosis not present

## 2018-09-26 DIAGNOSIS — K5900 Constipation, unspecified: Secondary | ICD-10-CM | POA: Diagnosis not present

## 2018-09-26 DIAGNOSIS — N281 Cyst of kidney, acquired: Secondary | ICD-10-CM | POA: Diagnosis not present

## 2018-09-26 DIAGNOSIS — K769 Liver disease, unspecified: Secondary | ICD-10-CM | POA: Diagnosis not present

## 2018-09-28 ENCOUNTER — Ambulatory Visit: Payer: Medicare HMO | Admitting: Family

## 2018-09-28 DIAGNOSIS — N952 Postmenopausal atrophic vaginitis: Secondary | ICD-10-CM | POA: Diagnosis not present

## 2018-09-28 DIAGNOSIS — R102 Pelvic and perineal pain: Secondary | ICD-10-CM | POA: Diagnosis not present

## 2018-09-28 DIAGNOSIS — N816 Rectocele: Secondary | ICD-10-CM | POA: Diagnosis not present

## 2018-10-04 ENCOUNTER — Encounter: Payer: Self-pay | Admitting: Family

## 2018-10-04 ENCOUNTER — Ambulatory Visit (INDEPENDENT_AMBULATORY_CARE_PROVIDER_SITE_OTHER): Payer: Medicare HMO | Admitting: Family

## 2018-10-04 VITALS — BP 123/64 | HR 70 | Temp 98.3°F | Ht 62.0 in | Wt 186.4 lb

## 2018-10-04 DIAGNOSIS — I10 Essential (primary) hypertension: Secondary | ICD-10-CM

## 2018-10-04 DIAGNOSIS — E8881 Metabolic syndrome: Secondary | ICD-10-CM

## 2018-10-04 DIAGNOSIS — N816 Rectocele: Secondary | ICD-10-CM

## 2018-10-04 DIAGNOSIS — K5900 Constipation, unspecified: Secondary | ICD-10-CM | POA: Diagnosis not present

## 2018-10-04 DIAGNOSIS — M1990 Unspecified osteoarthritis, unspecified site: Secondary | ICD-10-CM

## 2018-10-04 DIAGNOSIS — G609 Hereditary and idiopathic neuropathy, unspecified: Secondary | ICD-10-CM

## 2018-10-04 DIAGNOSIS — K219 Gastro-esophageal reflux disease without esophagitis: Secondary | ICD-10-CM | POA: Diagnosis not present

## 2018-10-04 DIAGNOSIS — E785 Hyperlipidemia, unspecified: Secondary | ICD-10-CM

## 2018-10-04 DIAGNOSIS — E669 Obesity, unspecified: Secondary | ICD-10-CM

## 2018-10-04 MED ORDER — HYDROCHLOROTHIAZIDE 25 MG PO TABS: 25.0000 mg | ORAL_TABLET | Freq: Every day | ORAL | 2 refills | Status: DC

## 2018-10-04 MED ORDER — PANTOPRAZOLE SODIUM 40 MG PO TBEC: 40.0000 mg | DELAYED_RELEASE_TABLET | Freq: Every day | ORAL | 2 refills | Status: DC

## 2018-10-04 MED ORDER — SIMVASTATIN 40 MG PO TABS: 40.0000 mg | ORAL_TABLET | Freq: Every day | ORAL | 1 refills | Status: DC

## 2018-10-04 MED ORDER — LINACLOTIDE 72 MCG PO CAPS: 72.0000 ug | ORAL_CAPSULE | Freq: Every day | ORAL | 3 refills | Status: DC

## 2018-10-04 MED ORDER — GABAPENTIN 600 MG PO TABS: ORAL_TABLET | ORAL | 2 refills | Status: DC

## 2018-10-04 NOTE — Patient Instructions (Signed)

## 2018-10-04 NOTE — Telephone Encounter (Signed)
Patient was seen by proviider and got refills on medications.

## 2018-10-04 NOTE — Progress Notes (Signed)
Subjective:    Patient ID: Charlotte Aguirre, female    DOB: [birth year], 72 y.o.   MRN: [id number]  Chief Complaint  Patient presents with  . Medical Management of Chronic Issues    due for refills   PT presents to the office today for chronic follow up. She is currently being followed by GYN for rectocele.  Hypertension  This is a chronic problem. The current episode started more than 1 year ago. The problem has been resolved since onset. The problem is controlled. Associated symptoms include malaise/fatigue. Pertinent negatives include no headaches, peripheral edema or shortness of breath. Risk factors for coronary artery disease include dyslipidemia. The current treatment provides moderate improvement. There is no history of kidney disease, CAD/MI or heart failure.  Hyperlipidemia  This is a chronic problem. The current episode started more than 1 year ago. The problem is controlled. Recent lipid tests were reviewed and are normal. Exacerbating diseases include obesity. Pertinent negatives include no shortness of breath. Current antihyperlipidemic treatment includes statins. The current treatment provides moderate improvement of lipids. Risk factors for coronary artery disease include dyslipidemia, hypertension, a sedentary lifestyle and post-menopausal.  Gastroesophageal Reflux  She reports no belching, no coughing or no heartburn. This is a chronic problem. The current episode started more than 1 year ago. The problem occurs occasionally. Risk factors include obesity. She has tried a PPI for the symptoms. The treatment provided moderate relief.  Arthritis  Presents for follow-up visit. She complains of pain and stiffness. Affected locations include the right MCP and left MCP. Her pain is at a severity of 4/10.  Constipation  This is a chronic problem. The current episode started more than 1 year ago. The problem has been waxing and waning since onset. Her stool frequency is 4 to 5 times per week.  She has tried laxatives and stool softeners for the symptoms. The treatment provided mild relief.  Metabolic Syndrome PT states she tried to watch her diet. She does not do regular exercise.  Osteopenia Dexa scan 04/28/17. She takes Vit D regularly.  Neuropathy Pt states she has intermittent aching and burning pain in bilateral feet that is worse when she is on her feet for extended periods.   Review of Systems  Constitutional: Positive for malaise/fatigue.  Respiratory: Negative for cough and shortness of breath.   Gastrointestinal: Positive for constipation. Negative for heartburn.  Musculoskeletal: Positive for arthritis and stiffness.  Neurological: Negative for headaches.  All other systems reviewed and are negative.      Objective:   Physical Exam  Constitutional: She is oriented to person, place, and time. She appears well-developed and well-nourished. No distress.  HENT:  Head: Atraumatic.  Right Ear: External ear normal.  Left Ear: External ear normal.  Mouth/Throat: Oropharynx is clear and moist.  Eyes: Pupils are equal, round, and reactive to light.  Neck: Normal range of motion. Neck supple. No thyromegaly present.  Cardiovascular: Normal rate, regular rhythm, normal heart sounds and intact distal pulses.  No murmur heard. Pulmonary/Chest: Effort normal and breath sounds normal. No respiratory distress. She has no wheezes.  Abdominal: Soft. Bowel sounds are normal. She exhibits no distension. There is no tenderness.  Musculoskeletal: Normal range of motion. She exhibits no edema or tenderness.  Neurological: She is alert and oriented to person, place, and time. She has normal reflexes. No cranial nerve deficit.  Skin: Skin is warm and dry.  Psychiatric: She has a normal mood and affect. Her behavior is normal. Judgment  and thought content normal.  Vitals reviewed.     BP 123/64   Pulse 70   Temp 98.3 F (36.8 C) (Oral)   Ht 5' 2" (1.575 m)   Wt 186 lb 6.4 oz  (84.6 kg)   BMI 34.09 kg/m      Assessment & Plan:  Charlotte Aguirre comes in today with chief complaint of Medical Management of Chronic Issues (due for refills)   Diagnosis and orders addressed:  1. Essential hypertension - hydrochlorothiazide (HYDRODIURIL) 25 MG tablet; Take 1 tablet (25 mg total) by mouth daily.  Dispense: 90 tablet; Refill: 2 - CMP14+EGFR - CBC with Differential/Platelet  2. Constipation, unspecified constipation type - linaclotide (LINZESS) 72 MCG capsule; Take 1 capsule (72 mcg total) by mouth daily before breakfast.  Dispense: 30 capsule; Refill: 3 - CMP14+EGFR - CBC with Differential/Platelet  3. Gastroesophageal reflux disease without esophagitis - pantoprazole (PROTONIX) 40 MG tablet; Take 1 tablet (40 mg total) by mouth daily.  Dispense: 90 tablet; Refill: 2 - CMP14+EGFR - CBC with Differential/Platelet  4. Hyperlipidemia with target LDL less than 100 - simvastatin (ZOCOR) 40 MG tablet; Take 1 tablet (40 mg total) by mouth daily.  Dispense: 90 tablet; Refill: 1 - CMP14+EGFR - CBC with Differential/Platelet - Lipid panel  5. Rectocele - CMP14+EGFR - CBC with Differential/Platelet  6. Osteoarthritis, unspecified osteoarthritis type, unspecified site - CMP14+EGFR - CBC with Differential/Platelet  7. Obesity (BMI 30-39.9) - CMP14+EGFR - CBC with Differential/Platelet  8. Metabolic syndrome - CMP14+EGFR - CBC with Differential/Platelet  9. Hereditary and idiopathic peripheral neuropathy - gabapentin (NEURONTIN) 600 MG tablet; Take 1200 mg in AM and 1800 mg at night  Dispense: 450 tablet; Refill: 2   Labs pending Health Maintenance reviewed Diet and exercise encouraged  Follow up plan: 6 months   Christy Hawks, FNP  

## 2018-10-05 LAB — CMP14+EGFR
ALT: 12 IU/L (ref 0–32)
AST: 17 IU/L (ref 0–40)
Albumin/Globulin Ratio: 1.6 (ref 1.2–2.2)
Albumin: 4.1 g/dL (ref 3.5–4.8)
Alkaline Phosphatase: 112 IU/L (ref 39–117)
BUN/Creatinine Ratio: 16 (ref 12–28)
BUN: 12 mg/dL (ref 8–27)
Bilirubin Total: 0.3 mg/dL (ref 0.0–1.2)
CO2: 28 mmol/L (ref 20–29)
Calcium: 9.5 mg/dL (ref 8.7–10.3)
Chloride: 101 mmol/L (ref 96–106)
Creatinine, Ser: 0.76 mg/dL (ref 0.57–1.00)
GFR calc Af Amer: 90 mL/min/{1.73_m2} (ref >59)
GFR calc non Af Amer: 78 mL/min/{1.73_m2} (ref >59)
Globulin, Total: 2.5 g/dL (ref 1.5–4.5)
Glucose: 94 mg/dL (ref 65–99)
Potassium: 3.7 mmol/L (ref 3.5–5.2)
Sodium: 145 mmol/L — ABNORMAL HIGH (ref 134–144)
Total Protein: 6.6 g/dL (ref 6.0–8.5)

## 2018-10-05 LAB — CBC WITH DIFFERENTIAL/PLATELET
Basophils Absolute: 0.1 10*3/uL (ref 0.0–0.2)
Basos: 1 %
EOS (ABSOLUTE): 0.2 10*3/uL (ref 0.0–0.4)
Eos: 2 %
Hematocrit: 45 % (ref 34.0–46.6)
Hemoglobin: 14.4 g/dL (ref 11.1–15.9)
Immature Grans (Abs): 0 10*3/uL (ref 0.0–0.1)
Immature Granulocytes: 0 %
Lymphocytes Absolute: 2.6 10*3/uL (ref 0.7–3.1)
Lymphs: 33 %
MCH: 26.5 pg — ABNORMAL LOW (ref 26.6–33.0)
MCHC: 32 g/dL (ref 31.5–35.7)
MCV: 83 fL (ref 79–97)
Monocytes Absolute: 0.5 10*3/uL (ref 0.1–0.9)
Monocytes: 6 %
Neutrophils Absolute: 4.5 10*3/uL (ref 1.4–7.0)
Neutrophils: 58 %
Platelets: 298 10*3/uL (ref 150–450)
RBC: 5.43 x10E6/uL — ABNORMAL HIGH (ref 3.77–5.28)
RDW: 13.4 % (ref 12.3–15.4)
WBC: 7.8 10*3/uL (ref 3.4–10.8)

## 2018-10-05 LAB — LIPID PANEL
Chol/HDL Ratio: 4.1 ratio (ref 0.0–4.4)
Cholesterol, Total: 175 mg/dL (ref 100–199)
HDL: 43 mg/dL (ref >39)
LDL Calculated: 96 mg/dL (ref 0–99)
Triglycerides: 180 mg/dL — ABNORMAL HIGH (ref 0–149)
VLDL Cholesterol Cal: 36 mg/dL (ref 5–40)

## 2018-10-07 ENCOUNTER — Encounter: Payer: Self-pay | Admitting: *Deleted

## 2018-10-07 ENCOUNTER — Ambulatory Visit (INDEPENDENT_AMBULATORY_CARE_PROVIDER_SITE_OTHER): Payer: Medicare HMO | Admitting: *Deleted

## 2018-10-07 VITALS — BP 115/66 | HR 77 | Wt 188.0 lb

## 2018-10-07 DIAGNOSIS — Z0000 Encounter for general adult medical examination without abnormal findings: Secondary | ICD-10-CM | POA: Diagnosis not present

## 2018-10-07 NOTE — Progress Notes (Addendum)
Subjective:   Charlotte Aguirre is a 72 y.o. female who presents for Medicare Annual (Subsequent) preventive examination.  Charlotte Aguirre is retired from [employer].  She enjoys crocheting, knitting and sewing.  She is active in her church and gets together weekly with ladies from church to visit and crochet.  She has 2 children, 3 step children, 4 grandchildren, 4 step grandchildren, and 7 great grandchildren.  Charlotte Aguirre feels her health is unchanged from last year.  She reports one ER visit a few weeks ago due to lower abdominal and vaginal discomfort.  She reports prior rectocele repair, and feels her discomfort is related to this.  The severe pain she was having when she went to the ER has improved, but she still states she has some discomfort with sitting on hard surfaces.   Review of Systems:   Genitourinary- vaginal burning and pressure   Cardiac Risk Factors include: advanced age (>48men, >12 women);dyslipidemia;hypertension     Objective:     Vitals: BP 115/66   Pulse 77   Wt 188 lb (85.3 kg)   BMI 34.39 kg/m   Body mass index is 34.39 kg/m.  Advanced Directives 10/07/2018 07/24/2015 03/28/2015 05/10/2014 03/22/2014  Does Patient Have a Medical Advance Directive? No No No Patient would not like information Patient does not have advance directive;Patient would not like information  Would patient like information on creating a medical advance directive? No - Patient declined - Yes - Educational materials given - -  Pre-existing out of facility DNR order (yellow form or pink MOST form) - - - - No    Tobacco Social History   Tobacco Use  Smoking Status Former Smoker  . Packs/day: 1.50  . Years: 30.00  . Pack years: 45.00  . Types: Cigarettes  . Last attempt to quit: 11/03/1988  . Years since quitting: 29.9  Smokeless Tobacco Never Used     Counseling given: No   Clinical Intake:     Pain Score: 2                  Past Medical History:  Diagnosis Date  .  Arthritis    thumb and fingers  . Cataract   . Fibromyalgia   . GERD (gastroesophageal reflux disease)   . Headache(784.0)   . Hyperlipidemia   . Hypertension   . IBS (irritable bowel syndrome)   . Memory changes   . Osteopenia   . Peripheral neuropathy   . Ulcer    gastric  . Vitamin D insufficiency   . Wrist fracture 2013   Past Surgical History:  Procedure Laterality Date  . ABDOMINAL HYSTERECTOMY     uterus frist and 10 years later both ovaries removed  . CHOLECYSTECTOMY    . COLONOSCOPY N/A 03/22/2014   Procedure: COLONOSCOPY;  Surgeon: Rogene Houston, MD;  Location: [facility];  Service: Endoscopy;  Laterality: N/A;  930  . EYE SURGERY Left 12/04/2017   Left eye cataract removal  . OOPHORECTOMY     Family History  Problem Relation Age of Onset  . Dementia Brother   . Heart attack Brother   . Heart failure Mother   . Heart failure Father   . Heart attack Father   . Glaucoma Father   . Heart failure Brother   . Heart attack Brother   . Heart failure Sister   . Heart attack Sister   . Diabetes Sister   . Obesity Sister   . Cancer Brother  LYMPHOMA  . Heart attack Brother   . Cancer Brother        lymphoma  . COPD Sister        from 2nd hand smoke - never a smoker  . Osteoporosis Sister   . Thyroid disease Son    Social History   Socioeconomic History  . Marital status: Married    Spouse name: Not on file  . Number of children: Not on file  . Years of education: Not on file  . Highest education level: High school graduate  Occupational History  . Occupation: Retired    Fish farm manager: Secondary school teacher  Social Needs  . Financial resource strain: Not hard at all  . Food insecurity:    Worry: Never true    Inability: Never true  . Transportation needs:    Medical: No    Non-medical: No  Tobacco Use  . Smoking status: Former Smoker    Packs/day: 1.50    Years: 30.00    Pack years: 45.00    Types: Cigarettes    Last attempt to quit:  11/03/1988    Years since quitting: 29.9  . Smokeless tobacco: Never Used  Substance and Sexual Activity  . Alcohol use: No  . Drug use: No  . Sexual activity: Yes    Partners: Male  Lifestyle  . Physical activity:    Days per week: 0 days    Minutes per session: 0 min  . Stress: Not at all  Relationships  . Social connections:    Talks on phone: More than three times a week    Gets together: More than three times a week    Attends religious service: More than 4 times per year    Active member of club or organization: Yes    Attends meetings of clubs or organizations: More than 4 times per year    Relationship status: Married  Other Topics Concern  . Not on file  Social History Narrative  . Not on file    Outpatient Encounter Medications as of 10/07/2018  Medication Sig  . Cholecalciferol (VITAMIN D-3 PO) Take 1 tablet by mouth 2 (two) times daily. 1000mg   . fluticasone (FLONASE) 50 MCG/ACT nasal spray Place 2 sprays into both nostrils daily.  Marland Kitchen gabapentin (NEURONTIN) 600 MG tablet Take 1200 mg in AM and 1800 mg at night  . hydrochlorothiazide (HYDRODIURIL) 25 MG tablet Take 1 tablet (25 mg total) by mouth daily.  Marland Kitchen linaclotide (LINZESS) 72 MCG capsule Take 1 capsule (72 mcg total) by mouth daily before breakfast.  . LUTEIN PO Take 1 tablet by mouth daily.  . Multiple Vitamins-Minerals (MULTIVITAMINS THER. W/MINERALS) TABS tablet Take 1 tablet by mouth daily.  . multivitamin-lutein (OCUVITE-LUTEIN) CAPS capsule Take by mouth.  . pantoprazole (PROTONIX) 40 MG tablet Take 1 tablet (40 mg total) by mouth daily.  . simvastatin (ZOCOR) 40 MG tablet Take 1 tablet (40 mg total) by mouth daily.   No facility-administered encounter medications on file as of 10/07/2018.     Activities of Daily Living In your present state of health, do you have any difficulty performing the following activities: 10/07/2018  Hearing? Y  Comment Has noticed decreased hearing, not interested in audiology  referral  Vision? Y  Comment Left eye- macular degeneration followed by Dr. Katy Fitch  Difficulty concentrating or making decisions? Y  Comment Trouble remembering at times, no significant change in the past year  Walking or climbing stairs? Y  Comment Trouble climbing stair due to  knee pain  Dressing or bathing? N  Doing errands, shopping? N  Preparing Food and eating ? N  Using the Toilet? N  In the past six months, have you accidently leaked urine? N  Do you have problems with loss of bowel control? N  Managing your Medications? N  Managing your Finances? N  Housekeeping or managing your Housekeeping? Y  Comment Has someone who helps her clean due to back problems  Some recent data might be hidden    Patient Care Team: Sharion Balloon, FNP as PCP - General (Family Medicine) Rogene Houston, MD as Consulting Physician (Gastroenterology) Harlen Labs, MD as Referring Physician (Optometry)    Assessment:   This is a routine wellness examination for [facility].  Exercise Activities and Dietary recommendations  Mrs. Hoggard states she eats 2 meals per day normally- she does not feel hungry in the morning for breakfast.  At lunch she has a protein and side usually at a restaurant- she states she usually gets chicken and beets, pinto beans, or potato salad, for supper she usually has [place] and cheese on a wheat wrap and fruit.  Recommended a diet of 3-5 servings of fruits and non-starchy vegetables per day, lean proteins, and whole grains.  She states she has access and resources for all the food she needs.  Current Exercise Habits: The patient does not participate in regular exercise at present, Exercise limited by: orthopedic condition(s)  Goals    . Exercise 3x per week (30 min per time)     Exercising at the [facility] with Silver Sneakers is a great option.       Fall Risk Fall Risk  10/07/2018 10/04/2018 06/07/2018 04/26/2018 09/29/2017  Falls in the past year? 0 0 No No No  Number falls  in past yr: - - - - -  Injury with Fall? - - - - -  Comment - - - - -  Risk for fall due to : - - - - -  Risk for fall due to: Comment - - - - -  Follow up - - - - -  Comment - - - - -   Is the patient's home free of loose throw rugs in walkways, pet beds, electrical cords, etc?   yes      Grab bars in the bathroom? no      Handrails on the stairs?   no stairs in home      Adequate lighting?   yes    Depression Screen PHQ 2/9 Scores 10/07/2018 10/04/2018 06/07/2018 04/26/2018  PHQ - 2 Score 0 0 0 0     Cognitive Function MMSE - Mini Mental State Exam 10/07/2018 03/28/2015  Orientation to time 5 5  Orientation to Place 5 5  Registration 3 3  Attention/ Calculation 5 5  Recall 3 3  Language- name 2 objects 2 2  Language- repeat 1 1  Language- follow 3 step command 3 -  Language- read & follow direction 1 1  Write a sentence 1 1  Copy design 1 1  Total score 30 -        Immunization History  Administered Date(s) Administered  . Influenza, High Dose Seasonal PF 07/24/2014, 08/07/2016, 08/11/2017, 08/31/2018  . Influenza,inj,Quad PF,6+ Mos 08/21/2015  . Pneumococcal Conjugate-13 03/28/2015  . Pneumococcal Polysaccharide-23 02/02/2014    Qualifies for Shingles Vaccine? Yes, declined today  Screening Tests Health Maintenance  Topic Date Due  . TETANUS/TDAP  10/08/2019 (Originally 10/10/1965)  .  DEXA SCAN  04/29/2019  . MAMMOGRAM  09/15/2019  . COLONOSCOPY  03/22/2024  . INFLUENZA VACCINE  Completed  . Hepatitis C Screening  Completed  . PNA vac Low Risk Adult  Completed    Cancer Screenings: Lung: Low Dose CT Chest recommended if Age 44-80 years, 30 pack-year currently smoking OR have quit w/in 15years. Patient does not qualify. Breast:  Up to date on Mammogram? Yes   Up to date of Bone Density/Dexa? Yes Colorectal: up to date  Additional Screenings:  Hepatitis C Screening: completed 02/02/2014     Plan:     Work on your goal of resuming your exercise - 3  times per week for 30 minutes each session.  Silver Sneakers at the [facility] is a great option.   If you complete the paperwork you have for Advance Directives, please bring a copy to our office to be filed in your medical record.  Follow up with [facility] as scheduled.  I have personally reviewed and noted the following in the patient's chart:   . Medical and social history . Use of alcohol, tobacco or illicit drugs  . Current medications and supplements . Functional ability and status . Nutritional status . Physical activity . Advanced directives . List of other physicians . Hospitalizations, surgeries, and ER visits in previous 12 months . Vitals . Screenings to include cognitive, depression, and falls . Referrals and appointments  In addition, I have reviewed and discussed with patient certain preventive protocols, quality metrics, and best practice recommendations. A written personalized care plan for preventive services as well as general preventive health recommendations were provided to patient.     Stina Gane M, RN  10/07/2018   I have reviewed and agree with the above AWV documentation.   Evelina Dun, FNP

## 2018-10-07 NOTE — Patient Instructions (Addendum)
Please work on your goal of resuming your exercise - 3 times per week for 30 minutes each session.  Silver Sneakers at the [facility] is a great option.    If you complete the paperwork you have for Advance Directives, please bring a copy to our office to be filed in your medical record.   Please follow up with [facility] as scheduled.  Thank you for coming in for your Annual Wellness Visit today!!  Preventive Care 74 Years and Older, Female Preventive care refers to lifestyle choices and visits with your health care provider that can promote health and wellness. What does preventive care include?  A yearly physical exam. This is also called an annual well check.  Dental exams once or twice a year.  Routine eye exams. Ask your health care provider how often you should have your eyes checked.  Personal lifestyle choices, including: ? Daily care of your teeth and gums. ? Regular physical activity. ? Eating a healthy diet. ? Avoiding tobacco and drug use. ? Limiting alcohol use. ? Practicing safe sex. ? Taking low-dose aspirin every day. ? Taking vitamin and mineral supplements as recommended by your health care provider. What happens during an annual well check? The services and screenings done by your health care provider during your annual well check will depend on your age, overall health, lifestyle risk factors, and family history of disease. Counseling Your health care provider may ask you questions about your:  Alcohol use.  Tobacco use.  Drug use.  Emotional well-being.  Home and relationship well-being.  Sexual activity.  Eating habits.  History of falls.  Memory and ability to understand (cognition).  Work and work Statistician.  Reproductive health.  Screening You may have the following tests or measurements:  Height, weight, and BMI.  Blood pressure.  Lipid and cholesterol levels. These may be checked every 5 years, or more frequently if you are over  77 years old.  Skin check.  Lung cancer screening. You may have this screening every year starting at age 79 if you have a 30-pack-year history of smoking and currently smoke or have quit within the past 15 years.  Fecal occult blood test (FOBT) of the stool. You may have this test every year starting at age 8.  Flexible sigmoidoscopy or colonoscopy. You may have a sigmoidoscopy every 5 years or a colonoscopy every 10 years starting at age 52.  Hepatitis C blood test.  Hepatitis B blood test.  Sexually transmitted disease (STD) testing.  Diabetes screening. This is done by checking your blood sugar (glucose) after you have not eaten for a while (fasting). You may have this done every 1-3 years.  Bone density scan. This is done to screen for osteoporosis. You may have this done starting at age 71.  Mammogram. This may be done every 1-2 years. Talk to your health care provider about how often you should have regular mammograms.  Talk with your health care provider about your test results, treatment options, and if necessary, the need for more tests. Vaccines Your health care provider may recommend certain vaccines, such as:  Influenza vaccine. This is recommended every year.  Tetanus, diphtheria, and acellular pertussis (Tdap, Td) vaccine. You may need a Td booster every 10 years.  Varicella vaccine. You may need this if you have not been vaccinated.  Zoster vaccine. You may need this after age 57.  Measles, mumps, and rubella (MMR) vaccine. You may need at least one dose of MMR if  you were born in [birth year] or later. You may also need a second dose.  Pneumococcal 13-valent conjugate (PCV13) vaccine. One dose is recommended after age 63.  Pneumococcal polysaccharide (PPSV23) vaccine. One dose is recommended after age 91.  Meningococcal vaccine. You may need this if you have certain conditions.  Hepatitis A vaccine. You may need this if you have certain conditions or if you travel  or work in places where you may be exposed to hepatitis A.  Hepatitis B vaccine. You may need this if you have certain conditions or if you travel or work in places where you may be exposed to hepatitis B.  Haemophilus influenzae type b (Hib) vaccine. You may need this if you have certain conditions.  Talk to your health care provider about which screenings and vaccines you need and how often you need them. This information is not intended to replace advice given to you by your health care provider. Make sure you discuss any questions you have with your health care provider. Document Released: 11/16/2015 Document Revised: 07/09/2016 Document Reviewed: 08/21/2015 Elsevier Interactive Patient Education  [employer].

## 2018-10-18 DIAGNOSIS — N952 Postmenopausal atrophic vaginitis: Secondary | ICD-10-CM | POA: Diagnosis not present

## 2018-10-18 DIAGNOSIS — R102 Pelvic and perineal pain: Secondary | ICD-10-CM | POA: Diagnosis not present

## 2018-11-09 ENCOUNTER — Ambulatory Visit: Payer: Medicare HMO | Admitting: Family

## 2018-11-11 ENCOUNTER — Ambulatory Visit (INDEPENDENT_AMBULATORY_CARE_PROVIDER_SITE_OTHER): Payer: Medicare HMO | Admitting: Family

## 2018-11-11 ENCOUNTER — Encounter: Payer: Self-pay | Admitting: Family

## 2018-11-11 VITALS — BP 130/62 | HR 67 | Temp 97.6°F | Ht 62.0 in | Wt 185.0 lb

## 2018-11-11 DIAGNOSIS — R1013 Epigastric pain: Secondary | ICD-10-CM

## 2018-11-11 DIAGNOSIS — K219 Gastro-esophageal reflux disease without esophagitis: Secondary | ICD-10-CM | POA: Diagnosis not present

## 2018-11-11 MED ORDER — SUCRALFATE 1 GM/10ML PO SUSP: 1.0000 g | Freq: Three times a day (TID) | ORAL | 2 refills | Status: DC

## 2018-11-11 MED ORDER — DEXLANSOPRAZOLE 60 MG PO CPDR: 60.0000 mg | DELAYED_RELEASE_CAPSULE | Freq: Every day | ORAL | 2 refills | Status: DC

## 2018-11-11 NOTE — Progress Notes (Signed)
Subjective:    Patient ID: Charlotte Aguirre, female    DOB: [birth year], 73 y.o.   MRN: [id number]  Chief Complaint  Patient presents with  . Nausea  . Abdominal Pain   PT presents to the office today with GERD and abdominal pain. She states over the last month she has had increase intermittent abdominal pain of 10 out 10 and nausea. She states over the last two days she has increased her Protonix to BID and all of her pain and nausea has gone.   She states when her abdominal pain would wake her up at night and the only thing to soothe her pain would be drinking milk.  Abdominal Pain  Associated symptoms include nausea. Pertinent negatives include no belching. Her past medical history is significant for GERD.  Gastroesophageal Reflux  She complains of abdominal pain, heartburn and nausea. She reports no belching or no dysphagia. This is a chronic problem. The current episode started more than 1 year ago. The problem occurs constantly. The problem has been waxing and waning. The heartburn wakes her from sleep. The symptoms are aggravated by medications. Risk factors include obesity. She has tried a PPI (milk) for the symptoms. The treatment provided mild relief.      Review of Systems  Gastrointestinal: Positive for abdominal pain, heartburn and nausea. Negative for dysphagia.  All other systems reviewed and are negative.      Objective:   Physical Exam Vitals signs reviewed.  Constitutional:      General: She is not in acute distress.    Appearance: She is well-developed.  HENT:     Head: Normocephalic and atraumatic.     Right Ear: External ear normal.  Eyes:     Pupils: Pupils are equal, round, and reactive to light.  Neck:     Musculoskeletal: Normal range of motion and neck supple.     Thyroid: No thyromegaly.  Cardiovascular:     Rate and Rhythm: Normal rate and regular rhythm.     Heart sounds: Normal heart sounds. No murmur.  Pulmonary:     Effort: Pulmonary effort is  normal. No respiratory distress.     Breath sounds: Normal breath sounds. No wheezing.  Abdominal:     General: Bowel sounds are normal. There is no distension.     Palpations: Abdomen is soft.     Tenderness: There is abdominal tenderness (mild) in the epigastric area.  Musculoskeletal: Normal range of motion.        General: No tenderness.  Skin:    General: Skin is warm and dry.  Neurological:     Mental Status: She is alert and oriented to person, place, and time.     Cranial Nerves: No cranial nerve deficit.     Deep Tendon Reflexes: Reflexes are normal and symmetric.  Psychiatric:        Behavior: Behavior normal.        Thought Content: Thought content normal.        Judgment: Judgment normal.     BP 130/62   Pulse 67   Temp 97.6 F (36.4 C) (Oral)   Ht _0  (1.575 m)   Wt 185 lb (83.9 kg)   BMI 33.84 kg/m      Assessment & Plan:  Chauncey Cruel Haberer comes in today with chief complaint of Nausea and Abdominal Pain   Diagnosis and orders addressed:  1. Epigastric pain - CBC with Differential/Platelet - BMP8+EGFR - H Pylori, IGM, IGG, IGA  AB - dexlansoprazole (DEXILANT) 60 MG capsule; Take 1 capsule (60 mg total) by mouth daily.  Dispense: 90 capsule; Refill: 2 - sucralfate (CARAFATE) 1 GM/10ML suspension; Take 10 mLs (1 g total) by mouth 4 (four) times daily -  with meals and at bedtime.  Dispense: 420 mL; Refill: 2 - Ambulatory referral to Gastroenterology  2. Gastroesophageal reflux disease without esophagitis We will stop protonix and start dexilant 60 mg  -Diet discussed- Avoid fried, spicy, citrus foods, caffeine and alcohol -Do not eat 2-3 hours before bedtime -Encouraged small frequent meals -Avoid NSAID's We will do a referral to [facility] pending -RTO 1 month  - CBC with Differential/Platelet - BMP8+EGFR - H Pylori, IGM, IGG, IGA AB - dexlansoprazole (DEXILANT) 60 MG capsule; Take 1 capsule (60 mg total) by mouth daily.  Dispense: 90 capsule; Refill:  2 - sucralfate (CARAFATE) 1 GM/10ML suspension; Take 10 mLs (1 g total) by mouth 4 (four) times daily -  with meals and at bedtime.  Dispense: 420 mL; Refill: 2 - Ambulatory referral to Gastroenterology    Evelina Dun, FNP

## 2018-11-11 NOTE — Patient Instructions (Signed)

## 2018-11-12 ENCOUNTER — Other Ambulatory Visit: Payer: Self-pay | Admitting: Family

## 2018-11-12 ENCOUNTER — Telehealth: Payer: Self-pay | Admitting: Family

## 2018-11-12 LAB — BMP8+EGFR
BUN/Creatinine Ratio: 18 (ref 12–28)
BUN: 16 mg/dL (ref 8–27)
CO2: 29 mmol/L (ref 20–29)
Calcium: 9.7 mg/dL (ref 8.7–10.3)
Chloride: 98 mmol/L (ref 96–106)
Creatinine, Ser: 0.9 mg/dL (ref 0.57–1.00)
GFR calc Af Amer: 73 mL/min/{1.73_m2} (ref >59)
GFR calc non Af Amer: 63 mL/min/{1.73_m2} (ref >59)
Glucose: 86 mg/dL (ref 65–99)
Potassium: 3.3 mmol/L — ABNORMAL LOW (ref 3.5–5.2)
Sodium: 144 mmol/L (ref 134–144)

## 2018-11-12 LAB — CBC WITH DIFFERENTIAL/PLATELET
Basophils Absolute: 0.1 10*3/uL (ref 0.0–0.2)
Basos: 1 %
EOS (ABSOLUTE): 0.2 10*3/uL (ref 0.0–0.4)
Eos: 2 %
Hematocrit: 43.3 % (ref 34.0–46.6)
Hemoglobin: 14 g/dL (ref 11.1–15.9)
Immature Grans (Abs): 0 10*3/uL (ref 0.0–0.1)
Immature Granulocytes: 0 %
Lymphocytes Absolute: 2.9 10*3/uL (ref 0.7–3.1)
Lymphs: 35 %
MCH: 26.9 pg (ref 26.6–33.0)
MCHC: 32.3 g/dL (ref 31.5–35.7)
MCV: 83 fL (ref 79–97)
Monocytes Absolute: 0.5 10*3/uL (ref 0.1–0.9)
Monocytes: 6 %
Neutrophils Absolute: 4.5 10*3/uL (ref 1.4–7.0)
Neutrophils: 56 %
Platelets: 264 10*3/uL (ref 150–450)
RBC: 5.2 x10E6/uL (ref 3.77–5.28)
RDW: 13.3 % (ref 11.7–15.4)
WBC: 8.1 10*3/uL (ref 3.4–10.8)

## 2018-11-12 LAB — H PYLORI, IGM, IGG, IGA AB
H pylori, IgM Abs: <9 units (ref 0.0–8.9)
H. pylori, IgA Abs: <9 units (ref 0.0–8.9)
H. pylori, IgG AbS: 0.26 Index Value (ref 0.00–0.79)

## 2018-11-12 MED ORDER — POTASSIUM CHLORIDE ER 10 MEQ PO TBCR: 10.0000 meq | EXTENDED_RELEASE_TABLET | Freq: Every day | ORAL | 1 refills | Status: DC

## 2018-11-12 NOTE — Telephone Encounter (Signed)
Pt aware of results 

## 2018-11-18 ENCOUNTER — Ambulatory Visit: Payer: Medicare HMO | Admitting: Family

## 2018-12-15 ENCOUNTER — Encounter: Payer: Self-pay | Admitting: Family

## 2018-12-15 ENCOUNTER — Ambulatory Visit (INDEPENDENT_AMBULATORY_CARE_PROVIDER_SITE_OTHER): Payer: Medicare HMO | Admitting: Family

## 2018-12-15 VITALS — BP 116/63 | HR 69 | Temp 97.3°F | Ht 62.0 in | Wt 187.6 lb

## 2018-12-15 DIAGNOSIS — R1013 Epigastric pain: Secondary | ICD-10-CM

## 2018-12-15 DIAGNOSIS — K219 Gastro-esophageal reflux disease without esophagitis: Secondary | ICD-10-CM

## 2018-12-15 NOTE — Progress Notes (Signed)
   Subjective:    Patient ID: Charlotte Aguirre, female    DOB: August 18, 1946, 73 y.o.   MRN: [id number]  Chief Complaint  Patient presents with  . recheck abdominal pain   Pt presents to the office today to check recurrent GERD. She was seen on 11/11/18 and we stopped her protonix and started on her Dexliant. She states her stomach pain is greatly improved now.  Gastroesophageal Reflux  She complains of belching, coughing and heartburn. This is a chronic problem. The current episode started more than 1 year ago. The problem occurs occasionally. The problem has been waxing and waning.      Review of Systems  Respiratory: Positive for cough.   Gastrointestinal: Positive for heartburn.  All other systems reviewed and are negative.      Objective:   Physical Exam Vitals signs reviewed.  Constitutional:      General: She is not in acute distress.    Appearance: Normal appearance. She is well-developed.  HENT:     Head: Normocephalic and atraumatic.     Right Ear: Tympanic membrane normal.     Left Ear: Tympanic membrane normal.  Eyes:     Pupils: Pupils are equal, round, and reactive to light.  Neck:     Musculoskeletal: Normal range of motion and neck supple.     Thyroid: No thyromegaly.  Cardiovascular:     Rate and Rhythm: Normal rate and regular rhythm.     Heart sounds: Normal heart sounds. No murmur.  Pulmonary:     Effort: Pulmonary effort is normal. No respiratory distress.     Breath sounds: Normal breath sounds. No wheezing.  Abdominal:     General: Bowel sounds are normal. There is no distension.     Palpations: Abdomen is soft.     Tenderness: There is no abdominal tenderness.  Musculoskeletal: Normal range of motion.        General: No tenderness.  Skin:    General: Skin is warm and dry.  Neurological:     Mental Status: She is alert and oriented to person, place, and time.     Cranial Nerves: No cranial nerve deficit.     Deep Tendon Reflexes: Reflexes are normal  and symmetric.  Psychiatric:        Behavior: Behavior normal.        Thought Content: Thought content normal.        Judgment: Judgment normal.     BP 116/63   Pulse 69   Temp (!) 97.3 F (36.3 C) (Oral)   Ht 5\' 2"  (1.575 m)   Wt 187 lb 9.6 oz (85.1 kg)   BMI 34.31 kg/m      Assessment & Plan:  Charlotte Aguirre comes in today with chief complaint of recheck abdominal pain   Diagnosis and orders addressed:  1. Epigastric pain - Ambulatory referral to Gastroenterology  2. Gastroesophageal reflux disease, esophagitis presence not specified -Continue Dexilant  -Diet discussed- Avoid fried, spicy, citrus foods, caffeine and alcohol -Do not eat 2-3 hours before bedtime -Encouraged small frequent meals -Avoid NSAID's -RTO as needed and keep chronic follow up     Charlotte Dun, FNP

## 2018-12-15 NOTE — Patient Instructions (Signed)

## 2018-12-23 ENCOUNTER — Other Ambulatory Visit: Payer: Self-pay

## 2018-12-23 DIAGNOSIS — R1013 Epigastric pain: Secondary | ICD-10-CM

## 2018-12-29 NOTE — Progress Notes (Signed)
Scheduled for 01/06/2019 Charlotte Aguirre

## 2019-01-06 ENCOUNTER — Ambulatory Visit (INDEPENDENT_AMBULATORY_CARE_PROVIDER_SITE_OTHER): Payer: Medicare HMO | Admitting: Internal Medicine

## 2019-01-31 ENCOUNTER — Ambulatory Visit (INDEPENDENT_AMBULATORY_CARE_PROVIDER_SITE_OTHER): Payer: Medicare HMO | Admitting: Internal Medicine

## 2019-02-01 ENCOUNTER — Encounter: Payer: Self-pay | Admitting: Family

## 2019-02-01 ENCOUNTER — Other Ambulatory Visit: Payer: Self-pay

## 2019-02-01 ENCOUNTER — Ambulatory Visit (INDEPENDENT_AMBULATORY_CARE_PROVIDER_SITE_OTHER): Payer: Medicare HMO | Admitting: Family

## 2019-02-01 DIAGNOSIS — B370 Candidal stomatitis: Secondary | ICD-10-CM | POA: Diagnosis not present

## 2019-02-01 MED ORDER — FIRST-DUKES MOUTHWASH MT SUSP: 10.0000 mL | Freq: Three times a day (TID) | OROMUCOSAL | 1 refills | Status: DC

## 2019-02-01 NOTE — Progress Notes (Signed)
   Virtual Visit via telephone Note  I connected with Charlotte Aguirre on 02/01/19 at 12:02 pm by telephone and verified that I am speaking with the correct person using two identifiers. Charlotte Aguirre is currently located at home and no one is currently with her during visit. The provider, Evelina Dun, FNP is located in their office at time of visit.  I discussed the limitations, risks, security and privacy concerns of performing an evaluation and management service by telephone and the availability of in person appointments. I also discussed with the patient that there may be a patient responsible charge related to this service. The patient expressed understanding and agreed to proceed.   History and Present Illness:   HPI PT presents to this virtual visit with complaints of mouth soreness that started 3 days ago. She states she noticed burning when eating or drinking. Reports a white coating on her tongue. Has not been on any recent antibiotics.   States she has had thrush before and her symptoms are similar.    Review of Systems  HENT:       Mouth soreness   All other systems reviewed and are negative.      Observations/Objective: No SOB or distress noted  Assessment and Plan: 1. Oral candidiasis Rinse mouth after eating Gargle with warm salt water as need Call office if symptoms worsen or do not improve  - Diphenhyd-Hydrocort-Nystatin (FIRST-DUKES MOUTHWASH) SUSP; Use as directed 10 mLs in the mouth or throat 4 (four) times daily - after meals and at bedtime.  Dispense: 400 mL; Refill: 1    I discussed the assessment and treatment plan with the patient. The patient was provided an opportunity to ask questions and all were answered. The patient agreed with the plan and demonstrated an understanding of the instructions.   The patient was advised to call back or seek an in-person evaluation if the symptoms worsen or if the condition fails to improve as anticipated.  The above  assessment and management plan was discussed with the patient. The patient verbalized understanding of and has agreed to the management plan. Patient is aware to call the clinic if symptoms persist or worsen. Patient is aware when to return to the clinic for a follow-up visit. Patient educated on when it is appropriate to go to the emergency department.    Call ended 12:09pm, I provided 7 minutes of non-face-to-face time during this encounter.    Evelina Dun, FNP

## 2019-02-09 ENCOUNTER — Telehealth: Payer: Self-pay | Admitting: Family

## 2019-02-09 MED ORDER — MAGIC MOUTHWASH W/LIDOCAINE: 5.0000 mL | Freq: Four times a day (QID) | ORAL | 2 refills | Status: DC | PRN

## 2019-02-09 NOTE — Telephone Encounter (Signed)
Will you please call magic mouthwash in. Please see printed rx.

## 2019-02-09 NOTE — Telephone Encounter (Signed)
Aware.  Script was called to [facility].

## 2019-02-09 NOTE — Telephone Encounter (Signed)
Called pharmacy and they state that they cannot get First Dukes mouthwash. States they called her to get specific ingredients and quantity and were told to cancel because it has been so long since visit. Patient still wants. Can this be sent in with specifics? Please review and advise

## 2019-02-09 NOTE — Telephone Encounter (Signed)
Called med to [facility] and left on voicemail. Patient notified and verbalized understanding

## 2019-02-14 ENCOUNTER — Ambulatory Visit (INDEPENDENT_AMBULATORY_CARE_PROVIDER_SITE_OTHER): Payer: Medicare HMO | Admitting: Nurse Practitioner

## 2019-02-14 ENCOUNTER — Encounter: Payer: Self-pay | Admitting: Nurse Practitioner

## 2019-02-14 DIAGNOSIS — K5900 Constipation, unspecified: Secondary | ICD-10-CM

## 2019-02-14 DIAGNOSIS — R1033 Periumbilical pain: Secondary | ICD-10-CM | POA: Diagnosis not present

## 2019-02-14 DIAGNOSIS — R110 Nausea: Secondary | ICD-10-CM | POA: Diagnosis not present

## 2019-02-14 NOTE — Progress Notes (Signed)
Patient ID: Charlotte Aguirre, female   DOB: February 07, 1946, 73 y.o.   MRN: [id number]    Virtual Visit via telephone Note  I connected with Charlotte Aguirre on 02/14/19 at 3:15 PM by telephone and verified that I am speaking with the correct person using two identifiers. Charlotte Aguirre is currently located at home and non one is currently with her during visit. The provider, Mandisa-Margaret Hassell Done, FNP is located in their office at time of visit.  I discussed the limitations, risks, security and privacy concerns of performing an evaluation and management service by telephone and the availability of in person appointments. I also discussed with the patient that there may be a patient responsible charge related to this service. The patient expressed understanding and agreed to proceed.   History and Present Illness:   Chief Complaint: Nausea and Dizziness   HPI Patient calls  In today c/o nausea and dizziness for up to 3 weeks. Then her bowels got messed up and she took soe colace and that unstopped her some. The nausea has caused 7 lbs of weight loss. The dizziness comes and goes.  She took a meclizine which has helped with dizziness. Started having right lower quadrant pain yesterday. She has a history of IBS. Today she says that she has had pain around her naval.      Review of Systems  Constitutional: Negative for chills and fever.  HENT: Negative.   Respiratory: Negative.   Gastrointestinal: Positive for abdominal pain (around [employer] today), constipation and nausea. Negative for blood in stool and vomiting.  Neurological: Positive for dizziness.  All other systems reviewed and are negative.    Observations/Objective: Alert and oriented Answers all questions appropriately No distress noted on phone  Assessment and Plan: Charlotte Aguirre in today with chief complaint of Nausea and Dizziness   1. Periumbilical abdominal pain  2. Nausea  3. Constipation, unspecified constipation type Bland diet  Continue meclizine and nausea meds as needed Needs face to face visit tomorrow  Follow Up Instructions: appointment with [facility] tomorrow morning at 9:25    I discussed the assessment and treatment plan with the patient. The patient was provided an opportunity to ask questions and all were answered. The patient agreed with the plan and demonstrated an understanding of the instructions.   The patient was advised to call back or seek an in-person evaluation if the symptoms worsen or if the condition fails to improve as anticipated.  The above assessment and management plan was discussed with the patient. The patient verbalized understanding of and has agreed to the management plan. Patient is aware to call the clinic if symptoms persist or worsen. Patient is aware when to return to the clinic for a follow-up visit. Patient educated on when it is appropriate to go to the emergency department.    I provided 12 minutes of non-face-to-face time during this encounter.    Isola-Margaret Hassell Done, FNP

## 2019-02-15 ENCOUNTER — Ambulatory Visit (INDEPENDENT_AMBULATORY_CARE_PROVIDER_SITE_OTHER): Payer: Medicare HMO | Admitting: Family

## 2019-02-15 ENCOUNTER — Encounter: Payer: Self-pay | Admitting: Family

## 2019-02-15 ENCOUNTER — Other Ambulatory Visit: Payer: Self-pay

## 2019-02-15 VITALS — BP 116/79 | HR 81 | Temp 98.3°F | Ht 62.0 in | Wt 180.0 lb

## 2019-02-15 DIAGNOSIS — R1030 Lower abdominal pain, unspecified: Secondary | ICD-10-CM | POA: Diagnosis not present

## 2019-02-15 DIAGNOSIS — F411 Generalized anxiety disorder: Secondary | ICD-10-CM

## 2019-02-15 DIAGNOSIS — K5900 Constipation, unspecified: Secondary | ICD-10-CM

## 2019-02-15 DIAGNOSIS — R69 Illness, unspecified: Secondary | ICD-10-CM | POA: Diagnosis not present

## 2019-02-15 LAB — CBC WITH DIFFERENTIAL/PLATELET
Basophils Absolute: 0.1 10*3/uL (ref 0.0–0.2)
Basos: 1 %
EOS (ABSOLUTE): 0.2 10*3/uL (ref 0.0–0.4)
Eos: 2 %
Hematocrit: 43.4 % (ref 34.0–46.6)
Hemoglobin: 14.8 g/dL (ref 11.1–15.9)
Immature Grans (Abs): 0 10*3/uL (ref 0.0–0.1)
Immature Granulocytes: 0 %
Lymphocytes Absolute: 3.1 10*3/uL (ref 0.7–3.1)
Lymphs: 34 %
MCH: 27.1 pg (ref 26.6–33.0)
MCHC: 34.1 g/dL (ref 31.5–35.7)
MCV: 79 fL (ref 79–97)
Monocytes Absolute: 0.6 10*3/uL (ref 0.1–0.9)
Monocytes: 7 %
Neutrophils Absolute: 5.1 10*3/uL (ref 1.4–7.0)
Neutrophils: 56 %
Platelets: 291 10*3/uL (ref 150–450)
RBC: 5.47 x10E6/uL — ABNORMAL HIGH (ref 3.77–5.28)
RDW: 13.5 % (ref 11.7–15.4)
WBC: 9.1 10*3/uL (ref 3.4–10.8)

## 2019-02-15 LAB — CMP14+EGFR
ALT: 10 IU/L (ref 0–32)
AST: 17 IU/L (ref 0–40)
Albumin/Globulin Ratio: 1.6 (ref 1.2–2.2)
Albumin: 4 g/dL (ref 3.7–4.7)
Alkaline Phosphatase: 132 IU/L — ABNORMAL HIGH (ref 39–117)
BUN/Creatinine Ratio: 17 (ref 12–28)
BUN: 17 mg/dL (ref 8–27)
Bilirubin Total: 0.2 mg/dL (ref 0.0–1.2)
CO2: 27 mmol/L (ref 20–29)
Calcium: 9.8 mg/dL (ref 8.7–10.3)
Chloride: 97 mmol/L (ref 96–106)
Creatinine, Ser: 0.99 mg/dL (ref 0.57–1.00)
GFR calc Af Amer: 65 mL/min/{1.73_m2} (ref >59)
GFR calc non Af Amer: 56 mL/min/{1.73_m2} — ABNORMAL LOW (ref >59)
Globulin, Total: 2.5 g/dL (ref 1.5–4.5)
Glucose: 109 mg/dL — ABNORMAL HIGH (ref 65–99)
Potassium: 3 mmol/L — ABNORMAL LOW (ref 3.5–5.2)
Sodium: 141 mmol/L (ref 134–144)
Total Protein: 6.5 g/dL (ref 6.0–8.5)

## 2019-02-15 LAB — URINALYSIS, COMPLETE
Bilirubin, UA: NEGATIVE
Glucose, UA: NEGATIVE
Nitrite, UA: NEGATIVE
RBC, UA: NEGATIVE
Specific Gravity, UA: 1.025 (ref 1.005–1.030)
Urobilinogen, Ur: 0.2 mg/dL (ref 0.2–1.0)
pH, UA: 6 (ref 5.0–7.5)

## 2019-02-15 LAB — MICROSCOPIC EXAMINATION
Epithelial Cells (non renal): >10 /hpf — AB (ref 0–10)
Renal Epithel, UA: NONE SEEN /hpf

## 2019-02-15 NOTE — Patient Instructions (Signed)

## 2019-02-15 NOTE — Progress Notes (Signed)
Subjective:    Patient ID: Charlotte Aguirre, female    DOB: 19-Dec-1945, 73 y.o.   MRN: [id number]  Abdominal Pain  This is a recurrent problem. The current episode started more than 1 month ago. The onset quality is gradual. The problem occurs intermittently. The problem has been waxing and waning. The pain is located in the RLQ and LLQ. The pain is at a severity of 2/10. The pain is mild. Quality: bloat, fullness. The abdominal pain does not radiate. Associated symptoms include constipation. Pertinent negatives include no dysuria, frequency, hematuria, nausea or vomiting. The pain is aggravated by belching.  Anxiety  Presents for follow-up visit. Symptoms include depressed mood, excessive worry, irritability and nervous/anxious behavior. Patient reports no nausea. Symptoms occur most days. The severity of symptoms is moderate.        Review of Systems  Constitutional: Positive for irritability.  Gastrointestinal: Positive for abdominal pain and constipation. Negative for nausea and vomiting.  Genitourinary: Negative for dysuria, frequency and hematuria.  Psychiatric/Behavioral: The patient is nervous/anxious.   All other systems reviewed and are negative.      Objective:   Physical Exam Vitals signs reviewed.  Constitutional:      General: She is not in acute distress.    Appearance: She is well-developed.  HENT:     Head: Normocephalic and atraumatic.  Eyes:     Pupils: Pupils are equal, round, and reactive to light.  Neck:     Musculoskeletal: Normal range of motion and neck supple.     Thyroid: No thyromegaly.  Cardiovascular:     Rate and Rhythm: Normal rate and regular rhythm.     Heart sounds: Normal heart sounds. No murmur.  Pulmonary:     Effort: Pulmonary effort is normal. No respiratory distress.     Breath sounds: Normal breath sounds. No wheezing.  Abdominal:     General: Bowel sounds are normal. There is no distension.     Palpations: Abdomen is soft.   Tenderness: There is no abdominal tenderness.  Musculoskeletal: Normal range of motion.        General: No tenderness.  Skin:    General: Skin is warm and dry.  Neurological:     Mental Status: She is alert and oriented to person, place, and time.     Cranial Nerves: No cranial nerve deficit.     Deep Tendon Reflexes: Reflexes are normal and symmetric.  Psychiatric:        Mood and Affect: Affect is tearful.        Behavior: Behavior normal.        Thought Content: Thought content normal.        Judgment: Judgment normal.      BP 116/79   Pulse 81   Temp 98.3 F (36.8 C) (Oral)   Ht _0  (1.575 m)   Wt 180 lb (81.6 kg)   BMI 32.92 kg/m       Assessment & Plan:  Charlotte Aguirre comes in today with chief complaint of Nausea (associated with bowel problems, lower abdominal pain) and feels a little depressed due to being housebound   Diagnosis and orders addressed:  1. Constipation, unspecified constipation type Start taking colace daily - Urinalysis, Complete - CMP14+EGFR - CBC with Differential/Platelet  2. Lower abdominal pain - Urinalysis, Complete - CMP14+EGFR - CBC with Differential/Platelet  3. GAD (generalized anxiety disorder) Long discussion with patient on anxiety and social distancing. Discussed getting in car and driving, learning to  FaceTime ect No meds at this time - Urinalysis, Complete - CMP14+EGFR - CBC with Differential/Platelet   Labs pending Health Maintenance reviewed Diet and exercise encouraged  Follow up plan: As neded or if symptoms worsen or do not improve   Evelina Dun, FNP

## 2019-02-16 ENCOUNTER — Other Ambulatory Visit: Payer: Self-pay | Admitting: Family

## 2019-02-16 MED ORDER — CEPHALEXIN 500 MG PO CAPS: 500.0000 mg | ORAL_CAPSULE | Freq: Two times a day (BID) | ORAL | 0 refills | Status: DC

## 2019-03-11 ENCOUNTER — Encounter: Payer: Self-pay | Admitting: Family

## 2019-03-11 ENCOUNTER — Ambulatory Visit (INDEPENDENT_AMBULATORY_CARE_PROVIDER_SITE_OTHER): Payer: Medicare HMO | Admitting: Family

## 2019-03-11 ENCOUNTER — Other Ambulatory Visit: Payer: Self-pay | Admitting: Family

## 2019-03-11 ENCOUNTER — Other Ambulatory Visit: Payer: Self-pay

## 2019-03-11 DIAGNOSIS — J069 Acute upper respiratory infection, unspecified: Secondary | ICD-10-CM | POA: Diagnosis not present

## 2019-03-11 DIAGNOSIS — J301 Allergic rhinitis due to pollen: Secondary | ICD-10-CM

## 2019-03-11 MED ORDER — FLUTICASONE PROPIONATE 50 MCG/ACT NA SUSP: 2.0000 | Freq: Every day | NASAL | 6 refills | Status: DC

## 2019-03-11 MED ORDER — CETIRIZINE HCL 10 MG PO TABS: 10.0000 mg | ORAL_TABLET | Freq: Every day | ORAL | 11 refills | Status: DC

## 2019-03-11 NOTE — Progress Notes (Signed)
   Virtual Visit via telephone Note  I connected with Charlotte Aguirre on 03/11/19 at 10:28 AM by telephone and verified that I am speaking with the correct person using two identifiers. Charlotte Cruel Harral is currently located at home and no one is currently with her during visit. The provider, Evelina Dun, FNP is located in their office at time of visit.  I discussed the limitations, risks, security and privacy concerns of performing an evaluation and management service by telephone and the availability of in person appointments. I also discussed with the patient that there may be a patient responsible charge related to this service. The patient expressed understanding and agreed to proceed.   History and Present Illness:  Sinus Problem  This is a recurrent problem. The current episode started 1 to 4 weeks ago. The problem has been waxing and waning since onset. There has been no fever. Her pain is at a severity of 0/10. She is experiencing no pain. Associated symptoms include congestion, headaches, a hoarse voice and sneezing. Pertinent negatives include no chills, coughing, ear pain, shortness of breath, sinus pressure, sore throat or swollen glands. (Rhinorrhea) Past treatments include acetaminophen and spray decongestants. The treatment provided mild relief.      Review of Systems  Constitutional: Negative for chills.  HENT: Positive for congestion, hoarse voice and sneezing. Negative for ear pain, sinus pressure and sore throat.   Respiratory: Negative for cough and shortness of breath.   Neurological: Positive for headaches.  All other systems reviewed and are negative.    Observations/Objective: No SOB or distress noted, Hoarse voice and sniffing of mucus  Assessment and Plan: Charlotte Aguirre comes in today with chief complaint of No chief complaint on file.   Diagnosis and orders addressed:  1. Viral URI - cetirizine (ZYRTEC) 10 MG tablet; Take 1 tablet (10 mg total) by mouth daily.   Dispense: 30 tablet; Refill: 11 - fluticasone (FLONASE) 50 MCG/ACT nasal spray; Place 2 sprays into both nostrils daily.  Dispense: 16 g; Refill: 6  2. Allergic rhinitis due to pollen, unspecified seasonality - cetirizine (ZYRTEC) 10 MG tablet; Take 1 tablet (10 mg total) by mouth daily.  Dispense: 30 tablet; Refill: 11 - fluticasone (FLONASE) 50 MCG/ACT nasal spray; Place 2 sprays into both nostrils daily.  Dispense: 16 g; Refill: 6  Start Zyrtec daily and flonase every night Avoid allergens when possible  Force fluids Call me if symptoms worsen or do not improve    I discussed the assessment and treatment plan with the patient. The patient was provided an opportunity to ask questions and all were answered. The patient agreed with the plan and demonstrated an understanding of the instructions.   The patient was advised to call back or seek an in-person evaluation if the symptoms worsen or if the condition fails to improve as anticipated.  The above assessment and management plan was discussed with the patient. The patient verbalized understanding of and has agreed to the management plan. Patient is aware to call the clinic if symptoms persist or worsen. Patient is aware when to return to the clinic for a follow-up visit. Patient educated on when it is appropriate to go to the emergency department.   Time call ended:  10:38 Am  I provided 10 minutes of non-face-to-face time during this encounter.    Evelina Dun, FNP

## 2019-04-05 ENCOUNTER — Other Ambulatory Visit: Payer: Self-pay | Admitting: Family

## 2019-04-05 DIAGNOSIS — E785 Hyperlipidemia, unspecified: Secondary | ICD-10-CM

## 2019-04-06 ENCOUNTER — Ambulatory Visit: Payer: Medicare HMO | Admitting: Family

## 2019-05-06 ENCOUNTER — Other Ambulatory Visit: Payer: Self-pay | Admitting: Family

## 2019-05-09 ENCOUNTER — Ambulatory Visit (INDEPENDENT_AMBULATORY_CARE_PROVIDER_SITE_OTHER): Payer: Medicare HMO | Admitting: Family

## 2019-05-09 ENCOUNTER — Encounter: Payer: Self-pay | Admitting: Family

## 2019-05-09 ENCOUNTER — Other Ambulatory Visit: Payer: Self-pay

## 2019-05-09 DIAGNOSIS — M5441 Lumbago with sciatica, right side: Secondary | ICD-10-CM | POA: Diagnosis not present

## 2019-05-09 DIAGNOSIS — J301 Allergic rhinitis due to pollen: Secondary | ICD-10-CM | POA: Diagnosis not present

## 2019-05-09 MED ORDER — PREDNISONE 10 MG (21) PO TBPK: ORAL_TABLET | ORAL | 0 refills | Status: DC

## 2019-05-09 MED ORDER — FEXOFENADINE HCL 180 MG PO TABS: 180.0000 mg | ORAL_TABLET | Freq: Every day | ORAL | 1 refills | Status: DC

## 2019-05-09 NOTE — Progress Notes (Signed)
Virtual Visit via telephone Note  I connected with Charlotte Aguirre on 05/09/19 at 8:27 AM  by telephone and verified that I am speaking with the correct person using two identifiers. Charlotte Aguirre is currently located at home and no one is currently with her during visit. The provider, Evelina Dun, FNP is located in their office at time of visit.  I discussed the limitations, risks, security and privacy concerns of performing an evaluation and management service by telephone and the availability of in person appointments. I also discussed with the patient that there may be a patient responsible charge related to this service. The patient expressed understanding and agreed to proceed.   History and Present Illness:  Pt calls the office today with recurrent back pain that is radiating down her right thigh. She states she went to water her flowers on Thursday and pulled the water hose. She states she woke up Friday with this pain that is unchanged.  Back Pain This is a recurrent problem. The current episode started in the past 7 days. The problem occurs constantly. The problem has been waxing and waning since onset. The pain is present in the lumbar spine and gluteal. The quality of the pain is described as aching, shooting and burning. The pain radiates to the right thigh. The pain is at a severity of 9/10. The pain is moderate. The symptoms are aggravated by bending and standing. Associated symptoms include leg pain. Pertinent negatives include no bladder incontinence, bowel incontinence, numbness or tingling. She has tried bed rest, NSAIDs and ice for the symptoms. The treatment provided mild relief.  Sinus Problem This is a chronic problem. The current episode started more than 1 year ago. The problem has been waxing and waning since onset. There has been no fever. Associated symptoms include congestion and sinus pressure. Pertinent negatives include no sneezing. Past treatments include spray  decongestants. The treatment provided mild relief.      Review of Systems  HENT: Positive for congestion and sinus pressure. Negative for sneezing.   Gastrointestinal: Negative for bowel incontinence.  Genitourinary: Negative for bladder incontinence.  Musculoskeletal: Positive for back pain.  Neurological: Negative for tingling and numbness.  All other systems reviewed and are negative.    Observations/Objective: No SOB or distress noted  Assessment and Plan: Charlotte Aguirre comes in today with chief complaint of No chief complaint on file.   Diagnosis and orders addressed:  1. Acute right-sided low back pain with right-sided sciatica Rest Ice ROM exercises encouraged Pt can not tolerate NSAID's - predniSONE (STERAPRED UNI-PAK 21 TAB) 10 MG (21) TBPK tablet; Use as directed  Dispense: 21 tablet; Refill: 0  2. Allergic rhinitis due to pollen, unspecified seasonality Will change Zyrtec to Allegra Continue Flonase Avoid allergens - fexofenadine (ALLEGRA ALLERGY) 180 MG tablet; Take 1 tablet (180 mg total) by mouth daily.  Dispense: 90 tablet; Refill: 1      I discussed the assessment and treatment plan with the patient. The patient was provided an opportunity to ask questions and all were answered. The patient agreed with the plan and demonstrated an understanding of the instructions.   The patient was advised to call back or seek an in-person evaluation if the symptoms worsen or if the condition fails to improve as anticipated.  The above assessment and management plan was discussed with the patient. The patient verbalized understanding of and has agreed to the management plan. Patient is aware to call the clinic if symptoms  persist or worsen. Patient is aware when to return to the clinic for a follow-up visit. Patient educated on when it is appropriate to go to the emergency department.   Time call ended:  8:39 AM  I provided 12 minutes of non-face-to-face time during this  encounter.    Evelina Dun, FNP

## 2019-05-18 ENCOUNTER — Telehealth: Payer: Self-pay | Admitting: Family

## 2019-05-18 DIAGNOSIS — M5441 Lumbago with sciatica, right side: Secondary | ICD-10-CM

## 2019-05-18 NOTE — Telephone Encounter (Signed)
Please defer to [facility] tomorrow when she is back.

## 2019-05-18 NOTE — Telephone Encounter (Signed)
Aware, provider will return tomorrow and address the request for a refill.

## 2019-05-19 MED ORDER — PREDNISONE 10 MG (21) PO TBPK: ORAL_TABLET | ORAL | 0 refills | Status: DC

## 2019-05-19 NOTE — Telephone Encounter (Signed)
Prescription sent to pharmacy.

## 2019-05-19 NOTE — Telephone Encounter (Signed)
Patient aware and verbalizes understanding. 

## 2019-06-09 ENCOUNTER — Other Ambulatory Visit: Payer: Self-pay | Admitting: Family

## 2019-06-09 DIAGNOSIS — E785 Hyperlipidemia, unspecified: Secondary | ICD-10-CM

## 2019-06-09 DIAGNOSIS — G609 Hereditary and idiopathic neuropathy, unspecified: Secondary | ICD-10-CM

## 2019-07-05 ENCOUNTER — Other Ambulatory Visit: Payer: Self-pay

## 2019-07-07 ENCOUNTER — Other Ambulatory Visit: Payer: Self-pay | Admitting: *Deleted

## 2019-07-07 ENCOUNTER — Other Ambulatory Visit: Payer: Self-pay | Admitting: Family

## 2019-07-07 DIAGNOSIS — E785 Hyperlipidemia, unspecified: Secondary | ICD-10-CM

## 2019-07-07 MED ORDER — SIMVASTATIN 40 MG PO TABS: 40.0000 mg | ORAL_TABLET | Freq: Every day | ORAL | 1 refills | Status: DC

## 2019-07-07 NOTE — Telephone Encounter (Signed)
[facility]. NTBS 30 days given 06/10/19

## 2019-07-08 ENCOUNTER — Ambulatory Visit (INDEPENDENT_AMBULATORY_CARE_PROVIDER_SITE_OTHER): Payer: Medicare HMO | Admitting: *Deleted

## 2019-07-08 ENCOUNTER — Other Ambulatory Visit: Payer: Self-pay

## 2019-07-08 DIAGNOSIS — Z23 Encounter for immunization: Secondary | ICD-10-CM

## 2019-07-12 ENCOUNTER — Ambulatory Visit (INDEPENDENT_AMBULATORY_CARE_PROVIDER_SITE_OTHER): Payer: Medicare HMO | Admitting: Family

## 2019-07-12 ENCOUNTER — Encounter: Payer: Self-pay | Admitting: Family

## 2019-07-12 DIAGNOSIS — M1990 Unspecified osteoarthritis, unspecified site: Secondary | ICD-10-CM | POA: Diagnosis not present

## 2019-07-12 DIAGNOSIS — E8881 Metabolic syndrome: Secondary | ICD-10-CM

## 2019-07-12 DIAGNOSIS — K219 Gastro-esophageal reflux disease without esophagitis: Secondary | ICD-10-CM

## 2019-07-12 DIAGNOSIS — R1013 Epigastric pain: Secondary | ICD-10-CM

## 2019-07-12 DIAGNOSIS — E669 Obesity, unspecified: Secondary | ICD-10-CM

## 2019-07-12 DIAGNOSIS — R002 Palpitations: Secondary | ICD-10-CM | POA: Diagnosis not present

## 2019-07-12 DIAGNOSIS — E559 Vitamin D deficiency, unspecified: Secondary | ICD-10-CM | POA: Diagnosis not present

## 2019-07-12 DIAGNOSIS — I10 Essential (primary) hypertension: Secondary | ICD-10-CM | POA: Diagnosis not present

## 2019-07-12 DIAGNOSIS — G609 Hereditary and idiopathic neuropathy, unspecified: Secondary | ICD-10-CM

## 2019-07-12 DIAGNOSIS — E785 Hyperlipidemia, unspecified: Secondary | ICD-10-CM

## 2019-07-12 MED ORDER — DEXILANT 60 MG PO CPDR: 60.0000 mg | DELAYED_RELEASE_CAPSULE | Freq: Every day | ORAL | 2 refills | Status: DC

## 2019-07-12 MED ORDER — GABAPENTIN 600 MG PO TABS: ORAL_TABLET | ORAL | 0 refills | Status: DC

## 2019-07-12 MED ORDER — SIMVASTATIN 40 MG PO TABS: 40.0000 mg | ORAL_TABLET | Freq: Every day | ORAL | 1 refills | Status: DC

## 2019-07-12 MED ORDER — HYDROCHLOROTHIAZIDE 25 MG PO TABS: 25.0000 mg | ORAL_TABLET | Freq: Every day | ORAL | 2 refills | Status: DC

## 2019-07-12 NOTE — Progress Notes (Signed)
Virtual Visit via telephone Note Due to COVID-19 pandemic this visit was conducted virtually. This visit type was conducted due to national recommendations for restrictions regarding the COVID-19 Pandemic (e.g. social distancing, sheltering in place) in an effort to limit this patient's exposure and mitigate transmission in our community. All issues noted in this document were discussed and addressed.  A physical exam was not performed with this format.  I connected with Charlotte Aguirre on 07/12/19 at 8:42 AM by telephone and verified that I am speaking with the correct person using two identifiers. Charlotte Aguirre is currently located at home and no one is currently with her during visit. The provider, Evelina Dun, FNP is located in their office at time of visit.  I discussed the limitations, risks, security and privacy concerns of performing an evaluation and management service by telephone and the availability of in person appointments. I also discussed with the patient that there may be a patient responsible charge related to this service. The patient expressed understanding and agreed to proceed.   History and Present Illness:  Pt calls today for chronic follow up. She is complaining of increased palpitations. She states she has had these for since she was in her 45's. She states she saw a Cardiologists at that ime and had an EKG and stress test that was normal.  Hypertension This is a chronic problem. The current episode started more than 1 year ago. The problem has been waxing and waning since onset. The problem is controlled. Associated symptoms include palpitations. Pertinent negatives include no headaches, malaise/fatigue, peripheral edema or shortness of breath. Risk factors for coronary artery disease include dyslipidemia, obesity and sedentary lifestyle. The current treatment provides moderate improvement. There is no history of kidney disease, CAD/MI or heart failure.  Gastroesophageal  Reflux She complains of belching and heartburn. She reports no coughing. This is a chronic problem. The current episode started more than 1 year ago. The problem occurs occasionally. The problem has been gradually improving. The symptoms are aggravated by certain foods. Risk factors include obesity. She has tried a PPI for the symptoms. The treatment provided moderate relief.  Arthritis Presents for follow-up visit. She reports no pain or stiffness. The symptoms have been resolved. Her pain is at a severity of 0/10. Associated symptoms include diarrhea.  Hyperlipidemia This is a chronic problem. The current episode started more than 1 year ago. The problem is controlled. Recent lipid tests were reviewed and are normal. Exacerbating diseases include obesity. Pertinent negatives include no shortness of breath. Current antihyperlipidemic treatment includes statins. The current treatment provides moderate improvement of lipids. Risk factors for coronary artery disease include dyslipidemia, hypertension and a sedentary lifestyle.  Palpitations  This is a chronic problem. The current episode started more than 1 month ago. The problem occurs intermittently. The problem has been waxing and waning. The symptoms are aggravated by caffeine and stress. Pertinent negatives include no coughing, malaise/fatigue or shortness of breath. She has tried bed rest for the symptoms. The treatment provided moderate relief. Risk factors include obesity, dyslipidemia and sedentary lifestyle.  Metabolic Syndrome PT takes her statin daily. Does not do any scheduled exercising, but tries to eat healthy diet.  Neuropathy Pain Pt states as long as she takes her gabapentin her pain is a 1 or 0, however, if she does not take it she has shooting pain down bilateral leg and burning pain in bilateral feet.    Review of Systems  Constitutional: Negative for malaise/fatigue.  Respiratory: Negative for cough and shortness of breath.    Cardiovascular: Positive for palpitations.  Gastrointestinal: Positive for diarrhea and heartburn.  Musculoskeletal: Positive for arthritis. Negative for stiffness.  Neurological: Negative for headaches.  All other systems reviewed and are negative.    Observations/Objective: No SOB or distress noted   Assessment and Plan: Charlotte Aguirre comes in today with chief complaint of No chief complaint on file.   Diagnosis and orders addressed:  1. Essential hypertension - CBC with Differential/Platelet; Future - CMP14+EGFR; Future - hydrochlorothiazide (HYDRODIURIL) 25 MG tablet; Take 1 tablet (25 mg total) by mouth daily.  Dispense: 90 tablet; Refill: 2  2. Gastroesophageal reflux disease without esophagitis - CBC with Differential/Platelet; Future - CMP14+EGFR; Future - dexlansoprazole (DEXILANT) 60 MG capsule; Take 1 capsule (60 mg total) by mouth daily.  Dispense: 90 capsule; Refill: 2  3. Osteoarthritis, unspecified osteoarthritis type, unspecified site - CBC with Differential/Platelet; Future - CMP14+EGFR; Future  4. Hyperlipidemia with target LDL less than 100 - CBC with Differential/Platelet; Future - CMP14+EGFR; Future - Lipid panel; Future - simvastatin (ZOCOR) 40 MG tablet; Take 1 tablet (40 mg total) by mouth daily. (Needs to be seen before next refill)  Dispense: 90 tablet; Refill: 1  5. Obesity (BMI 30-39.9) - CBC with Differential/Platelet; Future - CMP14+EGFR; Future  6. Metabolic syndrome - CBC with Differential/Platelet; Future - CMP14+EGFR; Future - Lipid panel; Future  7. Vitamin D deficiency - CBC with Differential/Platelet; Future - CMP14+EGFR; Future - VITAMIN D 25 Hydroxy (Vit-D Deficiency, Fractures); Future  8. Epigastric pain - CBC with Differential/Platelet; Future - CMP14+EGFR; Future - dexlansoprazole (DEXILANT) 60 MG capsule; Take 1 capsule (60 mg total) by mouth daily.  Dispense: 90 capsule; Refill: 2  9. Hereditary and idiopathic  peripheral neuropathy - CBC with Differential/Platelet; Future - CMP14+EGFR; Future - gabapentin (NEURONTIN) 600 MG tablet; TAKE 2 TABLETS BY MOUTH EVERY MORNING AND 3 TABLETS AT NIGHT (Needs to be seen before next refill)  Dispense: 150 tablet; Refill: 0  10. Palpitations Will do referral to Cardiologists  Stress management  Possible A Fib? - Ambulatory referral to Cardiology - CBC with Differential/Platelet; Future - CMP14+EGFR; Future - TSH; Future   Labs pending Health Maintenance reviewed Diet and exercise encouraged  Follow up plan: 6 momths      I discussed the assessment and treatment plan with the patient. The patient was provided an opportunity to ask questions and all were answered. The patient agreed with the plan and demonstrated an understanding of the instructions.   The patient was advised to call back or seek an in-person evaluation if the symptoms worsen or if the condition fails to improve as anticipated.  The above assessment and management plan was discussed with the patient. The patient verbalized understanding of and has agreed to the management plan. Patient is aware to call the clinic if symptoms persist or worsen. Patient is aware when to return to the clinic for a follow-up visit. Patient educated on when it is appropriate to go to the emergency department.   Time call ended:  9:05 AM  I provided 23 minutes of non-face-to-face time during this encounter.    Evelina Dun, FNP

## 2019-07-13 ENCOUNTER — Telehealth: Payer: Self-pay | Admitting: Family

## 2019-07-14 ENCOUNTER — Other Ambulatory Visit: Payer: Medicare HMO

## 2019-07-14 ENCOUNTER — Other Ambulatory Visit: Payer: Self-pay

## 2019-07-14 DIAGNOSIS — E559 Vitamin D deficiency, unspecified: Secondary | ICD-10-CM | POA: Diagnosis not present

## 2019-07-14 DIAGNOSIS — E8881 Metabolic syndrome: Secondary | ICD-10-CM

## 2019-07-14 DIAGNOSIS — E785 Hyperlipidemia, unspecified: Secondary | ICD-10-CM

## 2019-07-14 DIAGNOSIS — G609 Hereditary and idiopathic neuropathy, unspecified: Secondary | ICD-10-CM

## 2019-07-14 DIAGNOSIS — I10 Essential (primary) hypertension: Secondary | ICD-10-CM | POA: Diagnosis not present

## 2019-07-14 DIAGNOSIS — R1013 Epigastric pain: Secondary | ICD-10-CM

## 2019-07-14 DIAGNOSIS — K219 Gastro-esophageal reflux disease without esophagitis: Secondary | ICD-10-CM | POA: Diagnosis not present

## 2019-07-14 DIAGNOSIS — M1990 Unspecified osteoarthritis, unspecified site: Secondary | ICD-10-CM

## 2019-07-14 DIAGNOSIS — E669 Obesity, unspecified: Secondary | ICD-10-CM | POA: Diagnosis not present

## 2019-07-14 DIAGNOSIS — R002 Palpitations: Secondary | ICD-10-CM | POA: Diagnosis not present

## 2019-07-15 ENCOUNTER — Other Ambulatory Visit: Payer: Self-pay | Admitting: Family

## 2019-07-15 LAB — LIPID PANEL
Chol/HDL Ratio: 4.8 ratio — ABNORMAL HIGH (ref 0.0–4.4)
Cholesterol, Total: 179 mg/dL (ref 100–199)
HDL: 37 mg/dL — ABNORMAL LOW (ref >39)
LDL Chol Calc (NIH): 104 mg/dL — ABNORMAL HIGH (ref 0–99)
Triglycerides: 222 mg/dL — ABNORMAL HIGH (ref 0–149)
VLDL Cholesterol Cal: 38 mg/dL (ref 5–40)

## 2019-07-15 LAB — CBC WITH DIFFERENTIAL/PLATELET
Basophils Absolute: 0.1 x10E3/uL (ref 0.0–0.2)
Basos: 1 %
EOS (ABSOLUTE): 0.3 x10E3/uL (ref 0.0–0.4)
Eos: 3 %
Hematocrit: 44.7 % (ref 34.0–46.6)
Hemoglobin: 14.6 g/dL (ref 11.1–15.9)
Immature Grans (Abs): 0 x10E3/uL (ref 0.0–0.1)
Immature Granulocytes: 0 %
Lymphocytes Absolute: 3.3 x10E3/uL — ABNORMAL HIGH (ref 0.7–3.1)
Lymphs: 34 %
MCH: 27.3 pg (ref 26.6–33.0)
MCHC: 32.7 g/dL (ref 31.5–35.7)
MCV: 84 fL (ref 79–97)
Monocytes Absolute: 0.5 x10E3/uL (ref 0.1–0.9)
Monocytes: 6 %
Neutrophils Absolute: 5.3 x10E3/uL (ref 1.4–7.0)
Neutrophils: 56 %
Platelets: 309 x10E3/uL (ref 150–450)
RBC: 5.34 x10E6/uL — ABNORMAL HIGH (ref 3.77–5.28)
RDW: 13.5 % (ref 11.7–15.4)
WBC: 9.5 x10E3/uL (ref 3.4–10.8)

## 2019-07-15 LAB — CMP14+EGFR
ALT: 6 IU/L (ref 0–32)
AST: 14 IU/L (ref 0–40)
Albumin/Globulin Ratio: 1.9 (ref 1.2–2.2)
Albumin: 4 g/dL (ref 3.7–4.7)
Alkaline Phosphatase: 136 IU/L — ABNORMAL HIGH (ref 39–117)
BUN/Creatinine Ratio: 16 (ref 12–28)
BUN: 13 mg/dL (ref 8–27)
Bilirubin Total: 0.3 mg/dL (ref 0.0–1.2)
CO2: 30 mmol/L — ABNORMAL HIGH (ref 20–29)
Calcium: 9.2 mg/dL (ref 8.7–10.3)
Chloride: 98 mmol/L (ref 96–106)
Creatinine, Ser: 0.81 mg/dL (ref 0.57–1.00)
GFR calc Af Amer: 83 mL/min/{1.73_m2} (ref >59)
GFR calc non Af Amer: 72 mL/min/{1.73_m2} (ref >59)
Globulin, Total: 2.1 g/dL (ref 1.5–4.5)
Glucose: 97 mg/dL (ref 65–99)
Potassium: 3.1 mmol/L — ABNORMAL LOW (ref 3.5–5.2)
Sodium: 145 mmol/L — ABNORMAL HIGH (ref 134–144)
Total Protein: 6.1 g/dL (ref 6.0–8.5)

## 2019-07-15 LAB — TSH: TSH: 3.42 u[IU]/mL (ref 0.450–4.500)

## 2019-07-15 LAB — VITAMIN D 25 HYDROXY (VIT D DEFICIENCY, FRACTURES): Vit D, 25-Hydroxy: 26.7 ng/mL — ABNORMAL LOW (ref 30.0–100.0)

## 2019-07-15 MED ORDER — POTASSIUM CHLORIDE ER 10 MEQ PO TBCR: 10.0000 meq | EXTENDED_RELEASE_TABLET | Freq: Every day | ORAL | 1 refills | Status: DC

## 2019-07-18 ENCOUNTER — Telehealth: Payer: Self-pay | Admitting: Family

## 2019-07-18 DIAGNOSIS — I10 Essential (primary) hypertension: Secondary | ICD-10-CM

## 2019-07-18 DIAGNOSIS — R1013 Epigastric pain: Secondary | ICD-10-CM

## 2019-07-18 DIAGNOSIS — E785 Hyperlipidemia, unspecified: Secondary | ICD-10-CM

## 2019-07-18 DIAGNOSIS — K219 Gastro-esophageal reflux disease without esophagitis: Secondary | ICD-10-CM

## 2019-07-18 MED ORDER — SIMVASTATIN 40 MG PO TABS: 40.0000 mg | ORAL_TABLET | Freq: Every day | ORAL | 1 refills | Status: DC

## 2019-07-18 MED ORDER — DEXILANT 60 MG PO CPDR: 60.0000 mg | DELAYED_RELEASE_CAPSULE | Freq: Every day | ORAL | 2 refills | Status: DC

## 2019-07-18 MED ORDER — POTASSIUM CHLORIDE ER 10 MEQ PO TBCR: 10.0000 meq | EXTENDED_RELEASE_TABLET | Freq: Every day | ORAL | 1 refills | Status: DC

## 2019-07-18 MED ORDER — HYDROCHLOROTHIAZIDE 25 MG PO TABS: 25.0000 mg | ORAL_TABLET | Freq: Every day | ORAL | 2 refills | Status: DC

## 2019-07-18 NOTE — Telephone Encounter (Signed)
Refills sent to [facility].  Patient aware

## 2019-07-21 ENCOUNTER — Telehealth: Payer: Self-pay | Admitting: Family

## 2019-07-21 NOTE — Telephone Encounter (Signed)
Potassium was sent to [facility] on 07/18/2019.  Patient aware

## 2019-08-02 ENCOUNTER — Encounter: Payer: Self-pay | Admitting: Cardiology

## 2019-08-02 DIAGNOSIS — E876 Hypokalemia: Secondary | ICD-10-CM | POA: Insufficient documentation

## 2019-08-02 NOTE — Progress Notes (Signed)
Cardiology Office Note   Date:  08/03/2019   ID:  [name], [name] 1946/07/23, MRN [id number]  PCP:  Charlotte Balloon, FNP  Cardiologist:   No primary care provider on file. Referring:  Charlotte Balloon, FNP  Chief Complaint  Patient presents with  . Palpitations      History of Present Illness: Charlotte Aguirre is a 73 y.o. female who is referred by Charlotte Balloon, FNP for evaluation of palpitations.   She did see Dr. Johnsie Cancel in 2010 and I reviewed these records.  She had palpitations and was treated with beta blocker.  She had a normal echo.  She had no sustained arrhythmia but was noted to have occasional ectopic beats.  She had a perfusion study 14 years ago.    I did review recent labs and she has been hypokalemic.  She has been given potassium supplementation but has not had a repeat.  She says that her palpitations seem to be happening more recently.  The last 2 nights were bad.  Some nights are not as bad but it seems to be happening most days.  She will feel a flipping skipping beat.  She will have a quiver.  It will go on for a few minutes and then stop.  She feels like she needs to cough and that will take care of it.  She does not have any presyncope or syncope.  She does not describe chest pressure, neck or arm discomfort.  She does have shortness of breath with activity such as walking 50 yards to the mailbox but this has been chronic.  She is limited by neuropathy.  She is not particularly physically active.  She is not describing chest pressure, neck or arm discomfort.  She has had no weight gain or edema.  Past Medical History:  Diagnosis Date  . Arthritis    thumb and fingers  . Cataract   . Fibromyalgia   . GERD (gastroesophageal reflux disease)   . Hyperlipidemia   . Hypertension   . IBS (irritable bowel syndrome)   . Memory changes   . Osteopenia   . Peripheral neuropathy   . Ulcer    gastric  . Vitamin D insufficiency     Past Surgical History:   Procedure Laterality Date  . ABDOMINAL HYSTERECTOMY     uterus frist and 10 years later both ovaries removed  . CHOLECYSTECTOMY    . COLONOSCOPY N/A 03/22/2014   Procedure: COLONOSCOPY;  Surgeon: Rogene Houston, MD;  Location: [facility];  Service: Endoscopy;  Laterality: N/A;  930  . EYE SURGERY Left 12/04/2017   Left eye cataract removal  . OOPHORECTOMY       Current Outpatient Medications  Medication Sig Dispense Refill  . Cholecalciferol (VITAMIN D-3 PO) Take 1 tablet by mouth daily. 2000 units    . dexlansoprazole (DEXILANT) 60 MG capsule Take 1 capsule (60 mg total) by mouth daily. 90 capsule 2  . Docusate Sodium (COLACE PO) Take by mouth as needed.     . fexofenadine (ALLEGRA ALLERGY) 180 MG tablet Take 1 tablet (180 mg total) by mouth daily. 90 tablet 1  . gabapentin (NEURONTIN) 600 MG tablet TAKE 2 TABLETS BY MOUTH EVERY MORNING AND 3 TABLETS AT NIGHT (Needs to be seen before next refill) 150 tablet 0  . hydrochlorothiazide (HYDRODIURIL) 25 MG tablet Take 1 tablet (25 mg total) by mouth daily. 90 tablet 2  . LUTEIN PO Take 1 tablet by  mouth daily.    . potassium chloride (K-DUR) 10 MEQ tablet Take 1 tablet (10 mEq total) by mouth daily. 180 tablet 1  . simvastatin (ZOCOR) 40 MG tablet Take 1 tablet (40 mg total) by mouth daily. (Needs to be seen before next refill) 90 tablet 1   No current facility-administered medications for this visit.     Allergies:   Aspirin and Topiramate    Social History:  The patient  reports that she quit smoking about 30 years ago. Her smoking use included cigarettes. She has a 45.00 pack-year smoking history. She has never used smokeless tobacco. She reports that she does not drink alcohol or use drugs.   Family History:  The patient's family history includes COPD in her sister; Cancer in her brother and brother; Dementia in her brother; Diabetes in her sister; Glaucoma in her father; Heart attack in her brother, brother, brother, father, and  sister; Heart failure in her brother, father, mother, and sister; Obesity in her sister; Osteoporosis in her sister; Thyroid disease in her son.    ROS:  Please see the history of present illness.   Otherwise, review of systems are positive for none.   All other systems are reviewed and negative.    PHYSICAL EXAM: VS:  BP 120/78   Pulse 76   Ht 5\' 2"  (1.575 m)   Wt 185 lb (83.9 kg)   BMI 33.84 kg/m  , BMI Body mass index is 33.84 kg/m. GENERAL:  Well appearing HEENT:  Pupils equal round and reactive, fundi not visualized, oral mucosa unremarkable NECK:  No jugular venous distention, waveform within normal limits, carotid upstroke brisk and symmetric, no bruits, no thyromegaly LYMPHATICS:  No cervical, inguinal adenopathy LUNGS:  Clear to auscultation bilaterally BACK:  No CVA tenderness CHEST:  Unremarkable HEART:  PMI not displaced or sustained,S1 and S2 within normal limits, no S3, no S4, no clicks, no rubs, no murmurs ABD:  Flat, positive bowel sounds normal in frequency in pitch, no bruits, no rebound, no guarding, no midline pulsatile mass, no hepatomegaly, no splenomegaly EXT:  2 plus pulses throughout, no edema, no cyanosis no clubbing SKIN:  No rashes no nodules NEURO:  Cranial nerves II through XII grossly intact, motor grossly intact throughout PSYCH:  Cognitively intact, oriented to person place and time    EKG:  EKG is ordered today. The ekg ordered today demonstrates sinus rhythm, rate 76, axis within normal limits, intervals within normal limits, no acute ST-T wave changes.   Recent Labs: 07/14/2019: ALT 6; BUN 13; Creatinine, Ser 0.81; Hemoglobin 14.6; Platelets 309; Potassium 3.1; Sodium 145; TSH 3.420    Lipid Panel    Component Value Date/Time   CHOL 179 07/14/2019 0840   TRIG 222 (H) 07/14/2019 0840   TRIG 217 (H) 10/31/2014 1526   HDL 37 (L) 07/14/2019 0840   HDL 38 (L) 10/31/2014 1526   CHOLHDL 4.8 (H) 07/14/2019 0840   CHOLHDL 4.9 02/17/2009 0805    VLDL 36 02/17/2009 0805   LDLCALC 104 (H) 07/14/2019 0840      Wt Readings from Last 3 Encounters:  08/03/19 185 lb (83.9 kg)  02/15/19 180 lb (81.6 kg)  12/15/18 187 lb 9.6 oz (85.1 kg)      Other studies Reviewed: Additional studies/ records that were reviewed today include: Labs. Review of the above records demonstrates:  Please see elsewhere in the note.     ASSESSMENT AND PLAN:  PALPITATIONS:   She has a normal TSH.  I am going to repeat her potassium and perhaps if this is still low supplementing this further or more likely switching from hydrochlorothiazide to spironolactone might help.  She is also can wear a Holter monitor.  Further evaluation will be based on these results.  At this point with the previous negative cardiac work-up and normal exam I am not suspecting other imaging studies.  HYPOKALEMIA: This will be addressed as above.   Current medicines are reviewed at length with the patient today.  The patient does not have concerns regarding medicines.  The following changes have been made:  no change  Labs/ tests ordered today include:   Orders Placed This Encounter  Procedures  . Basic metabolic panel  . HOLTER MONITOR - 24 HOUR  . EKG 12-Lead     Disposition:   FU with me in 3 months.     Signed, Minus Breeding, MD  08/03/2019 3:53 PM    [place]

## 2019-08-03 ENCOUNTER — Other Ambulatory Visit: Payer: Self-pay | Admitting: *Deleted

## 2019-08-03 ENCOUNTER — Ambulatory Visit (INDEPENDENT_AMBULATORY_CARE_PROVIDER_SITE_OTHER): Payer: Medicare HMO | Admitting: Cardiology

## 2019-08-03 ENCOUNTER — Other Ambulatory Visit: Payer: Medicare HMO

## 2019-08-03 ENCOUNTER — Encounter: Payer: Self-pay | Admitting: Cardiology

## 2019-08-03 ENCOUNTER — Other Ambulatory Visit: Payer: Self-pay

## 2019-08-03 VITALS — BP 120/78 | HR 76 | Ht 62.0 in | Wt 185.0 lb

## 2019-08-03 DIAGNOSIS — E876 Hypokalemia: Secondary | ICD-10-CM

## 2019-08-03 DIAGNOSIS — R002 Palpitations: Secondary | ICD-10-CM

## 2019-08-03 NOTE — Patient Instructions (Signed)
Medication Instructions:  The current medical regimen is effective;  continue present plan and medications.  If you need a refill on your cardiac medications before your next appointment, please call your pharmacy.   Lab work: Please have blood work today at [facility]   ([facility]) If you have labs (blood work) drawn today and your tests are completely normal, you will receive your results only by: Marland Kitchen MyChart Message (if you have MyChart) OR . A paper copy in the mail If you have any lab test that is abnormal or we need to change your treatment, we will call you to review the results.  Testing/Procedures: Your physician has recommended that you wear a holter monitor for 48 hours. Holter monitors are medical devices that record the heart's electrical activity. Doctors most often use these monitors to diagnose arrhythmias. Arrhythmias are problems with the speed or rhythm of the heartbeat. The monitor is a small, portable device. You can wear one while you do your normal daily activities. This is usually used to diagnose what is causing palpitations/syncope (passing out).  Follow-Up: Follow up in 3 months with Dr Percival Spanish in [place].  Thank you for choosing [facility]!!   '

## 2019-08-04 LAB — BASIC METABOLIC PANEL
BUN/Creatinine Ratio: 19 (ref 12–28)
BUN: 14 mg/dL (ref 8–27)
CO2: 28 mmol/L (ref 20–29)
Calcium: 9.1 mg/dL (ref 8.7–10.3)
Chloride: 100 mmol/L (ref 96–106)
Creatinine, Ser: 0.75 mg/dL (ref 0.57–1.00)
GFR calc Af Amer: 91 mL/min/{1.73_m2} (ref >59)
GFR calc non Af Amer: 79 mL/min/{1.73_m2} (ref >59)
Glucose: 104 mg/dL — ABNORMAL HIGH (ref 65–99)
Potassium: 3.7 mmol/L (ref 3.5–5.2)
Sodium: 142 mmol/L (ref 134–144)

## 2019-08-17 ENCOUNTER — Telehealth: Payer: Self-pay

## 2019-08-17 NOTE — Telephone Encounter (Signed)
Spoke to pt, went over monitor details and instructions. Verified address. Ordered 3 day ZIO to be mailed to pt's home address.

## 2019-08-22 ENCOUNTER — Ambulatory Visit (INDEPENDENT_AMBULATORY_CARE_PROVIDER_SITE_OTHER): Payer: Medicare HMO

## 2019-08-22 DIAGNOSIS — R002 Palpitations: Secondary | ICD-10-CM

## 2019-08-23 DIAGNOSIS — H35372 Puckering of macula, left eye: Secondary | ICD-10-CM | POA: Diagnosis not present

## 2019-08-23 DIAGNOSIS — H353131 Nonexudative age-related macular degeneration, bilateral, early dry stage: Secondary | ICD-10-CM | POA: Diagnosis not present

## 2019-08-23 DIAGNOSIS — H25811 Combined forms of age-related cataract, right eye: Secondary | ICD-10-CM | POA: Diagnosis not present

## 2019-08-23 DIAGNOSIS — H26492 Other secondary cataract, left eye: Secondary | ICD-10-CM | POA: Diagnosis not present

## 2019-09-03 ENCOUNTER — Other Ambulatory Visit: Payer: Self-pay | Admitting: Family

## 2019-09-03 DIAGNOSIS — J301 Allergic rhinitis due to pollen: Secondary | ICD-10-CM

## 2019-09-05 DIAGNOSIS — R002 Palpitations: Secondary | ICD-10-CM | POA: Diagnosis not present

## 2019-09-08 DIAGNOSIS — H35363 Drusen (degenerative) of macula, bilateral: Secondary | ICD-10-CM | POA: Diagnosis not present

## 2019-09-08 DIAGNOSIS — H35372 Puckering of macula, left eye: Secondary | ICD-10-CM | POA: Diagnosis not present

## 2019-09-08 DIAGNOSIS — H353132 Nonexudative age-related macular degeneration, bilateral, intermediate dry stage: Secondary | ICD-10-CM | POA: Diagnosis not present

## 2019-09-08 DIAGNOSIS — H2511 Age-related nuclear cataract, right eye: Secondary | ICD-10-CM | POA: Diagnosis not present

## 2019-10-11 DIAGNOSIS — Z7189 Other specified counseling: Secondary | ICD-10-CM | POA: Insufficient documentation

## 2019-10-11 NOTE — Progress Notes (Signed)
Cardiology Office Note   Date:  10/12/2019   ID:  Charlotte Aguirre, DOB 1946-07-06, MRN [id number]  PCP:  Sharion Balloon, FNP  Cardiologist:   No primary care provider on file. Referring:  Sharion Balloon, FNP  Chief Complaint  Patient presents with  . Palpitations      History of Present Illness: Charlotte Aguirre is a 73 y.o. female who is referred by Sharion Balloon, FNP for evaluation of palpitations.   She did see Dr. Johnsie Cancel in 2010.   She had palpitations and was treated with beta blocker.  She had a normal echo.  She had no sustained arrhythmia but was noted to have occasional ectopic beats.  She had a perfusion study 14 years ago.  I saw her for this in Sept.  She wore a Holter and she had brief runs of SVT.    I went back and reviewed this with her.  She does feel the palpitations.  She had 1 run of this 23 beats.  She says it comes on sporadically and off which she did not want to take a medicine every day so we talked about a pill in pocket approach.  She is not had any presyncope or syncope.  She has had no chest pressure, neck or arm discomfort.  She had no weight gain or edema.   Past Medical History:  Diagnosis Date  . Arthritis    thumb and fingers  . Cataract   . Fibromyalgia   . GERD (gastroesophageal reflux disease)   . Hyperlipidemia   . Hypertension   . IBS (irritable bowel syndrome)   . Memory changes   . Osteopenia   . Peripheral neuropathy   . Ulcer    gastric  . Vitamin D insufficiency     Past Surgical History:  Procedure Laterality Date  . ABDOMINAL HYSTERECTOMY     uterus frist and 10 years later both ovaries removed  . CHOLECYSTECTOMY    . COLONOSCOPY N/A 03/22/2014   Procedure: COLONOSCOPY;  Surgeon: Rogene Houston, MD;  Location: [facility];  Service: Endoscopy;  Laterality: N/A;  930  . EYE SURGERY Left 12/04/2017   Left eye cataract removal  . OOPHORECTOMY       Current Outpatient Medications  Medication Sig Dispense Refill  .  Cholecalciferol (VITAMIN D-3 PO) Take 1 tablet by mouth daily. 2000 units    . dexlansoprazole (DEXILANT) 60 MG capsule Take 1 capsule (60 mg total) by mouth daily. 90 capsule 2  . Docusate Sodium (COLACE PO) Take by mouth as needed.     . fexofenadine (ALLEGRA) 180 MG tablet TAKE 1 TABLET BY MOUTH EVERY DAY 90 tablet 1  . gabapentin (NEURONTIN) 600 MG tablet TAKE 2 TABLETS BY MOUTH EVERY MORNING AND 3 TABLETS AT NIGHT (Needs to be seen before next refill) 150 tablet 0  . hydrochlorothiazide (HYDRODIURIL) 25 MG tablet Take 1 tablet (25 mg total) by mouth daily. 90 tablet 2  . LUTEIN PO Take 1 tablet by mouth daily.    . potassium chloride (K-DUR) 10 MEQ tablet Take 1 tablet (10 mEq total) by mouth daily. 180 tablet 1  . simvastatin (ZOCOR) 40 MG tablet Take 1 tablet (40 mg total) by mouth daily. (Needs to be seen before next refill) 90 tablet 1  . propranolol (INDERAL) 10 MG tablet Take 1 tablet (10 mg total) by mouth 2 (two) times daily as needed. 60 tablet 6   No current facility-administered  medications for this visit.     Allergies:   Aspirin and Topiramate     ROS:  Please see the history of present illness.   Otherwise, review of systems are positive for none.   All other systems are reviewed and negative.    PHYSICAL EXAM: VS:  BP 122/78   Pulse 80   Ht 5\' 2"  (1.575 m)   Wt 190 lb (86.2 kg)   BMI 34.75 kg/m  , BMI Body mass index is 34.75 kg/m. GENERAL:  Well appearing NECK:  No jugular venous distention, waveform within normal limits, carotid upstroke brisk and symmetric, no bruits, no thyromegaly LUNGS:  Clear to auscultation bilaterally CHEST:  Unremarkable HEART:  PMI not displaced or sustained,S1 and S2 within normal limits, no S3, no S4, no clicks, no rubs, no murmurs ABD:  Flat, positive bowel sounds normal in frequency in pitch, no bruits, no rebound, no guarding, no midline pulsatile mass, no hepatomegaly, no splenomegaly EXT:  2 plus pulses throughout, no edema, no  cyanosis no clubbing   EKG:  EKG is not ordered today.    Recent Labs: 07/14/2019: ALT 6; Hemoglobin 14.6; Platelets 309; TSH 3.420 08/03/2019: BUN 14; Creatinine, Ser 0.75; Potassium 3.7; Sodium 142    Lipid Panel    Component Value Date/Time   CHOL 179 07/14/2019 0840   TRIG 222 (H) 07/14/2019 0840   TRIG 217 (H) 10/31/2014 1526   HDL 37 (L) 07/14/2019 0840   HDL 38 (L) 10/31/2014 1526   CHOLHDL 4.8 (H) 07/14/2019 0840   CHOLHDL 4.9 02/17/2009 0805   VLDL 36 02/17/2009 0805   LDLCALC 104 (H) 07/14/2019 0840      Wt Readings from Last 3 Encounters:  10/12/19 190 lb (86.2 kg)  08/03/19 185 lb (83.9 kg)  02/15/19 180 lb (81.6 kg)      Other studies Reviewed: Additional studies/ records that were reviewed today include: Event monitor. Review of the above records demonstrates:  Please see elsewhere in the note.     ASSESSMENT AND PLAN:  PALPITATIONS:   She has a normal TSH.  She wore a Holter as above.   I am going to give her propranolol 10 mg 3 times daily as needed.  She wants to avoid daily medication.   HYPOKALEMIA:  This was resolved in Sept. there was some confusion about her supplement and she can clarify with her primary provider.    COVID EDUCATION: We talked about the vaccine.   Current medicines are reviewed at length with the patient today.  The patient does not have concerns regarding medicines.  The following changes have been made:  As above  Labs/ tests ordered today include: None  No orders of the defined types were placed in this encounter.    Disposition:   FU with me in 12 months.     Signed, Minus Breeding, MD  10/12/2019 2:16 PM    [facility]

## 2019-10-12 ENCOUNTER — Ambulatory Visit (INDEPENDENT_AMBULATORY_CARE_PROVIDER_SITE_OTHER): Payer: Medicare HMO | Admitting: Cardiology

## 2019-10-12 ENCOUNTER — Encounter: Payer: Self-pay | Admitting: Cardiology

## 2019-10-12 ENCOUNTER — Other Ambulatory Visit: Payer: Self-pay

## 2019-10-12 VITALS — BP 122/78 | HR 80 | Ht 62.0 in | Wt 190.0 lb

## 2019-10-12 DIAGNOSIS — E876 Hypokalemia: Secondary | ICD-10-CM | POA: Diagnosis not present

## 2019-10-12 DIAGNOSIS — R002 Palpitations: Secondary | ICD-10-CM

## 2019-10-12 DIAGNOSIS — Z7189 Other specified counseling: Secondary | ICD-10-CM

## 2019-10-12 MED ORDER — PROPRANOLOL HCL 10 MG PO TABS: 10.0000 mg | ORAL_TABLET | Freq: Two times a day (BID) | ORAL | 6 refills | Status: DC | PRN

## 2019-10-12 NOTE — Patient Instructions (Signed)
Medication Instructions:  You may take Propranolol 10 mg twice a day as needed.  Continue all other medications as listed.  *If you need a refill on your cardiac medications before your next appointment, please call your pharmacy*  Follow-Up: At [facility], you and your health needs are our priority.  As part of our continuing mission to provide you with exceptional heart care, we have created designated Provider Care Teams.  These Care Teams include your primary Cardiologist (physician) and Advanced Practice Providers (APPs -  Physician Assistants and Nurse Practitioners) who all work together to provide you with the care you need, when you need it.  Your next appointment:   12 month(s)  The format for your next appointment:   In Person  Provider:   Minus Breeding, MD  Thank you for choosing [facility]!!

## 2019-11-15 ENCOUNTER — Ambulatory Visit (INDEPENDENT_AMBULATORY_CARE_PROVIDER_SITE_OTHER): Payer: Medicare HMO | Admitting: Family

## 2019-11-15 ENCOUNTER — Encounter: Payer: Self-pay | Admitting: Family

## 2019-11-15 DIAGNOSIS — Z20822 Contact with and (suspected) exposure to covid-19: Secondary | ICD-10-CM | POA: Diagnosis not present

## 2019-11-15 DIAGNOSIS — R197 Diarrhea, unspecified: Secondary | ICD-10-CM | POA: Diagnosis not present

## 2019-11-15 NOTE — Progress Notes (Signed)
   Virtual Visit via telephone Note Due to COVID-19 pandemic this visit was conducted virtually. This visit type was conducted due to national recommendations for restrictions regarding the COVID-19 Pandemic (e.g. social distancing, sheltering in place) in an effort to limit this patient's exposure and mitigate transmission in our community. All issues noted in this document were discussed and addressed.  A physical exam was not performed with this format.  I connected with Charlotte Aguirre on 11/15/19 at 4:30 pm  by telephone and verified that I am speaking with the correct person using two identifiers. Charlotte Aguirre is currently located at home and no one is currently with her during visit. The provider, Evelina Dun, FNP is located in their office at time of visit.  I discussed the limitations, risks, security and privacy concerns of performing an evaluation and management service by telephone and the availability of in person appointments. I also discussed with the patient that there may be a patient responsible charge related to this service. The patient expressed understanding and agreed to proceed.   History and Present Illness:  She calls the office today with intermittent diarrhea over the last two weeks. She states she is having fatigue. Denies any fever. She reports she has lost 10 lbs. She reports her niece is COVID positive.  Diarrhea  This is a new problem. The current episode started 1 to 4 weeks ago. The problem occurs 2 to 4 times per day. The problem has been gradually improving. The stool consistency is described as watery. Associated symptoms include abdominal pain (Uncomfortable at times), headaches (first 5 days) and weight loss. Pertinent negatives include no bloating, chills, coughing, fever, increased  flatus, myalgias or vomiting. Associated symptoms comments: Fatigue, nausea. There are no known risk factors. She has tried electrolyte solution and increased fluids for the symptoms.  The treatment provided mild relief.       Review of Systems  Constitutional: Positive for weight loss. Negative for chills and fever.  Respiratory: Negative for cough.   Gastrointestinal: Positive for abdominal pain (Uncomfortable at times) and diarrhea. Negative for bloating, flatus and vomiting.  Musculoskeletal: Negative for myalgias.  Neurological: Positive for headaches (first 5 days).  All other systems reviewed and are negative.    Observations/Objective: No SOB or distress noted   Assessment and Plan: 1. Diarrhea, unspecified type  2. Suspected COVID-19 virus infection  Force fluids PT will go get COVID tested  BRAT diet Imodium as needed for diarrhea Bland diet Tylenol as needed Call if symptoms worsen or do not improve    I discussed the assessment and treatment plan with the patient. The patient was provided an opportunity to ask questions and all were answered. The patient agreed with the plan and demonstrated an understanding of the instructions.   The patient was advised to call back or seek an in-person evaluation if the symptoms worsen or if the condition fails to improve as anticipated.  The above assessment and management plan was discussed with the patient. The patient verbalized understanding of and has agreed to the management plan. Patient is aware to call the clinic if symptoms persist or worsen. Patient is aware when to return to the clinic for a follow-up visit. Patient educated on when it is appropriate to go to the emergency department.   Time call ended:  4:45 pm   I provided 15 minutes of non-face-to-face time during this encounter.    Evelina Dun, FNP

## 2019-12-07 ENCOUNTER — Other Ambulatory Visit: Payer: Self-pay | Admitting: Family

## 2019-12-07 DIAGNOSIS — E785 Hyperlipidemia, unspecified: Secondary | ICD-10-CM

## 2019-12-30 ENCOUNTER — Other Ambulatory Visit: Payer: Self-pay

## 2020-01-02 ENCOUNTER — Encounter: Payer: Self-pay | Admitting: Family

## 2020-01-02 ENCOUNTER — Ambulatory Visit (INDEPENDENT_AMBULATORY_CARE_PROVIDER_SITE_OTHER): Payer: Medicare HMO | Admitting: Family

## 2020-01-02 ENCOUNTER — Other Ambulatory Visit: Payer: Self-pay

## 2020-01-02 VITALS — BP 130/75 | HR 68 | Temp 97.8°F | Ht 62.0 in | Wt 181.4 lb

## 2020-01-02 DIAGNOSIS — M7071 Other bursitis of hip, right hip: Secondary | ICD-10-CM | POA: Diagnosis not present

## 2020-01-02 DIAGNOSIS — M546 Pain in thoracic spine: Secondary | ICD-10-CM | POA: Diagnosis not present

## 2020-01-02 DIAGNOSIS — M7072 Other bursitis of hip, left hip: Secondary | ICD-10-CM | POA: Diagnosis not present

## 2020-01-02 MED ORDER — PREDNISONE 10 MG (21) PO TBPK: ORAL_TABLET | ORAL | 0 refills | Status: DC

## 2020-01-02 MED ORDER — BACLOFEN 5 MG PO TABS: 5.0000 mg | ORAL_TABLET | Freq: Three times a day (TID) | ORAL | 0 refills | Status: DC

## 2020-01-02 NOTE — Patient Instructions (Signed)
Thoracic Strain A thoracic strain, which is sometimes called a mid-back strain, is an injury to the muscles or tendons that attach to the upper part of your back behind your chest. This type of injury occurs when a muscle is overstretched or overloaded. Thoracic strains can range from mild to severe. Mild strains may involve stretching a muscle or tendon without tearing it. These injuries may heal in 1-2 weeks. More severe strains involve tearing of muscle fibers or tendons. These will cause more pain and may take 6-8 weeks to heal. What are the causes? This condition may be caused by:  Trauma, such as a fall or a hit to the body.  Twisting or overstretching the back. This may result from doing activities that require a lot of energy, such as lifting heavy objects. In some cases, the cause may not be known. What increases the risk? This injury is more common in:  Athletes.  People with obesity. What are the signs or symptoms? The main symptom of this condition is pain in the middle back, especially with movement. Other symptoms include:  Stiffness or limited range of motion.  Sudden muscle tightening (spasms). How is this diagnosed? This condition may be diagnosed based on:  Your symptoms.  Your medical history.  A physical exam.  Imaging tests, such as X-rays or an MRI. How is this treated? This condition may be treated with:  Resting the injured area.  Applying heat and cold to the injured area.  Over-the-counter medicines for pain and inflammation, such as NSAIDs.  Prescription pain medicine or muscle relaxants may be needed for a short time.  Physical therapy. This will involve doing stretching and strengthening exercises. Follow these instructions at home: Managing pain, stiffness, and swelling      If directed, put ice on the injured area. ? Put ice in a plastic bag. ? Place a towel between your skin and the bag. ? Leave the ice on for 20 minutes, 2-3 times  a day.  If directed, apply heat to the affected area as often as told by your health care provider. Use the heat source that your health care provider recommends, such as a moist heat pack or a heating pad. ? Place a towel between your skin and the heat source. ? Leave the heat on for 20-30 minutes. ? Remove the heat if your skin turns bright red. This is especially important if you are unable to feel pain, heat, or cold. You may have a greater risk of getting burned. Activity  Rest and return to your normal activities as told by your health care provider. Ask your health care provider what activities are safe for you.  Do exercises as told by your health care provider. Medicines  Take over-the-counter and prescription medicines only as told by your health care provider.  Ask your health care provider if the medicine prescribed to you: ? Requires you to avoid driving or using heavy machinery. ? Can cause constipation. You may need to take these actions to prevent or treat constipation:  Drink enough fluid to keep your urine pale yellow.  Take over-the-counter or prescription medicines.  Eat foods that are high in fiber, such as beans, whole grains, and fresh fruits and vegetables.  Limit foods that are high in fat and processed sugars, such as fried or sweet foods. Injury prevention To prevent a future mid-back injury:  Always warm up properly before physical activity or sports.  Cool down and stretch after being active.  Use correct form when playing sports and lifting heavy objects. Bend your knees before you lift heavy objects.  Use good posture when sitting and standing.  Stay physically fit and maintain a healthy weight. ? Do at least 150 minutes of moderate-intensity exercise each week, such as brisk walking or water aerobics. ? Do strength exercises at least 2 times each week.  General instructions  Do not use any products that contain nicotine or tobacco, such as  cigarettes, e-cigarettes, and chewing tobacco. If you need help quitting, ask your health care provider.  Keep all follow-up visits as told by your health care provider. This is important. Contact a health care provider if:  Your pain is not helped by medicine.  Your pain or stiffness is getting worse.  You develop pain or stiffness in your neck or lower back. Get help right away if you:  Have shortness of breath.  Have chest pain.  Develop numbness or weakness in your legs or arms.  Have involuntary loss of urine (urinary incontinence). Summary  A thoracic strain, which is sometimes called a mid-back strain, is an injury to the muscles or tendons that attach to the upper part of your back behind your chest.  This type of injury occurs when a muscle is overstretched or overloaded.  Rest and return to your normal activities as told by your health care provider. If directed, apply heat or ice to the affected area as often as told by your health care provider.  Take over-the-counter and prescription medicines only as told by your health care provider.  Contact a health care provider if you have new or worsening symptoms. This information is not intended to replace advice given to you by your health care provider. Make sure you discuss any questions you have with your health care provider. Document Revised: 09/07/2018 Document Reviewed: 09/07/2018 Elsevier Patient Education  [place]. Hip Bursitis  Hip bursitis is inflammation of a fluid-filled sac (bursa) in the hip joint. The bursa prevents the bones in the hip joint from rubbing against each other. Hip bursitis can cause mild to moderate pain, and symptoms often come and go over time. What are the causes? This condition may be caused by:  Injury to the hip.  Overuse of the muscles that surround the hip joint.  Previous injury or surgery of the hip.  Arthritis or gout.  Diabetes.  Thyroid disease.   Infection. In some cases, the cause may not be known. What are the signs or symptoms? Symptoms of this condition include:  Mild or moderate pain in the hip area. Pain may get worse with movement.  Tenderness and swelling of the hip, especially on the outer side of the hip.  In rare cases, the bursa may become infected. This may cause a fever, as well as warmth and redness in the area. Symptoms may come and go. How is this diagnosed? This condition may be diagnosed based on:  A physical exam.  Your medical history.  X-rays.  Removal of fluid from your inflamed bursa for testing (biopsy). You may be sent to a health care provider who specializes in bone diseases (orthopedist) or a provider who specializes in joint inflammation (rheumatologist). How is this treated? This condition is treated by resting, icing, applying pressure (compression), and raising (elevating) the injured area. This is called RICE treatment. In some cases, this may be enough to make your symptoms go away. Treatment may also include:  Using crutches.  Draining fluid out of  the bursa to help relieve swelling.  Injecting medicine that helps to reduce inflammation (cortisone).  Additional medicines if the bursa is infected. Follow these instructions at home: Managing pain, stiffness, and swelling   If directed, put ice on the painful area. ? Put ice in a plastic bag. ? Place a towel between your skin and the bag. ? Leave the ice on for 20 minutes, 2-3 times a day. ? Raise (elevate) your hip above the level of your heart as much as you can without pain. To do this, try putting a pillow under your hips while you lie down. Activity  Return to your normal activities as told by your health care provider. Ask your health care provider what activities are safe for you.  Rest and protect your hip as much as possible until your pain and swelling get better. General instructions  Take over-the-counter and  prescription medicines only as told by your health care provider.  Wear compression wraps only as told by your health care provider.  Do not use your hip to support your body weight until your health care provider says that you can. Use crutches as told by your health care provider.  Gently massage and stretch your injured area as often as is comfortable.  Keep all follow-up visits as told by your health care provider. This is important. How is this prevented?  Exercise regularly, as told by your health care provider.  Warm up and stretch before being active.  Cool down and stretch after being active.  If an activity irritates your hip or causes pain, avoid the activity as much as possible.  Avoid sitting down for long periods at a time. Contact a health care provider if you:  Have a fever.  Develop new symptoms.  Have difficulty walking or doing everyday activities.  Have pain that gets worse or does not get better with medicine.  Develop red skin or a feeling of warmth in your hip area. Get help right away if you:  Cannot move your hip.  Have severe pain. Summary  Hip bursitis is inflammation of a fluid-filled sac (bursa) in the hip joint.  Hip bursitis can cause mild to moderate pain, and symptoms often come and go over time.  This condition is treated with rest, ice, compression, elevation, and medicines. This information is not intended to replace advice given to you by your health care provider. Make sure you discuss any questions you have with your health care provider. Document Revised: 06/28/2018 Document Reviewed: 06/28/2018 Elsevier Patient Education  [place].

## 2020-01-02 NOTE — Progress Notes (Signed)
Subjective:    Patient ID: Charlotte Aguirre, female    DOB: 1946/01/20, 74 y.o.   MRN: [id number]  Chief Complaint  Patient presents with  . Back Pain    MID BACK PAIN WHEN SHE IS ON FEET AND DRIVING MAKES IT WORSE.   Marland Kitchen Hip Pain    BOTH HIPS, BUT RIGHT IS WORSE     Back Pain This is a new problem. The current episode started more than 1 month ago. The problem occurs intermittently. The problem has been waxing and waning since onset. The pain is present in the thoracic spine. The quality of the pain is described as aching. The pain does not radiate. The pain is at a severity of 10/10. The pain is moderate. The symptoms are aggravated by standing and sitting (driving). Pertinent negatives include no bladder incontinence, bowel incontinence, leg pain, tingling or weakness. Risk factors include poor posture. Treatments tried: tylenol. The treatment provided mild relief.  Hip Pain  The incident occurred more than 1 week ago. There was no injury mechanism. The pain is present in the left hip and right hip. The pain is at a severity of 6/10. The pain is moderate. The pain has been intermittent since onset. Pertinent negatives include no tingling. She reports no foreign bodies present. The symptoms are aggravated by weight bearing. She has tried acetaminophen for the symptoms. The treatment provided mild relief.      Review of Systems  Gastrointestinal: Negative for bowel incontinence.  Genitourinary: Negative for bladder incontinence.  Musculoskeletal: Positive for back pain.  Neurological: Negative for tingling and weakness.  All other systems reviewed and are negative.      Objective:   Physical Exam Vitals reviewed.  Constitutional:      General: She is not in acute distress.    Appearance: She is well-developed.  HENT:     Head: Normocephalic and atraumatic.     Right Ear: Tympanic membrane normal.     Left Ear: Tympanic membrane normal.  Eyes:     Pupils: Pupils are equal, round,  and reactive to light.  Neck:     Thyroid: No thyromegaly.  Cardiovascular:     Rate and Rhythm: Normal rate and regular rhythm.     Heart sounds: Normal heart sounds. No murmur.  Pulmonary:     Effort: Pulmonary effort is normal. No respiratory distress.     Breath sounds: Normal breath sounds. No wheezing.  Abdominal:     General: Bowel sounds are normal. There is no distension.     Palpations: Abdomen is soft.     Tenderness: There is no abdominal tenderness.  Musculoskeletal:        General: No tenderness. Normal range of motion.       Arms:     Cervical back: Normal range of motion and neck supple.     Comments: Bilateral lateral hip pain with palpation, full ROM of hip.   Skin:    General: Skin is warm and dry.  Neurological:     Mental Status: She is alert and oriented to person, place, and time.     Cranial Nerves: No cranial nerve deficit.     Deep Tendon Reflexes: Reflexes are normal and symmetric.  Psychiatric:        Behavior: Behavior normal.        Thought Content: Thought content normal.        Judgment: Judgment normal.      BP 130/75   Pulse 68  Temp 97.8 F (36.6 C) (Temporal)   Ht 5\' 2"  (1.575 m)   Wt 181 lb 6.4 oz (82.3 kg)   SpO2 97%   BMI 33.18 kg/m       Assessment & Plan:  Charlotte Aguirre comes in today with chief complaint of Back Pain (MID BACK PAIN WHEN SHE IS ON FEET AND DRIVING MAKES IT WORSE. ) and Hip Pain (BOTH HIPS, BUT RIGHT IS WORSE )   Diagnosis and orders addressed:  1. Bursitis of both hips, unspecified bursa - predniSONE (STERAPRED UNI-PAK 21 TAB) 10 MG (21) TBPK tablet; Use as directed  Dispense: 21 tablet; Refill: 0  2. Acute left-sided thoracic back pain - predniSONE (STERAPRED UNI-PAK 21 TAB) 10 MG (21) TBPK tablet; Use as directed  Dispense: 21 tablet; Refill: 0 - Baclofen 5 MG TABS; Take 5 mg by mouth in the morning, at noon, and at bedtime.  Dispense: 90 tablet; Refill: 0    Rest Ice  ROM exercises encouraged Pt  can not tolerate NSAID's because hx of ulcer Sedation precautions discussed RTO if symptoms worsen or do not improve   Evelina Dun, FNP

## 2020-01-15 DIAGNOSIS — K5900 Constipation, unspecified: Secondary | ICD-10-CM | POA: Diagnosis not present

## 2020-01-23 ENCOUNTER — Ambulatory Visit (INDEPENDENT_AMBULATORY_CARE_PROVIDER_SITE_OTHER): Payer: Medicare HMO | Admitting: *Deleted

## 2020-01-23 DIAGNOSIS — Z0000 Encounter for general adult medical examination without abnormal findings: Secondary | ICD-10-CM

## 2020-01-23 NOTE — Progress Notes (Addendum)
MEDICARE ANNUAL WELLNESS VISIT  01/23/2020  Telephone Visit Disclaimer This Medicare AWV was conducted by telephone due to national recommendations for restrictions regarding the COVID-19 Pandemic (e.g. social distancing).  I verified, using two identifiers, that I am speaking with Charlotte Cruel Loera or their authorized healthcare agent. I discussed the limitations, risks, security, and privacy concerns of performing an evaluation and management service by telephone and the potential availability of an in-person appointment in the future. The patient expressed understanding and agreed to proceed.   Subjective:  Charlotte Aguirre is a 74 y.o. female patient of Hawks, Theador Hawthorne, FNP who had a Medicare Annual Wellness Visit today via telephone. Timikia is retired and lives with her husband [name]. She  has 2 sons, neither live locally. She reports that she is socially active and does interact with friends/family regularly. She is not physically active and enjoys crocheting and quiliting. She is active in her church.   Patient Care Team: Sharion Balloon, FNP as PCP - General (Family Medicine) Rogene Houston, MD as Consulting Physician (Gastroenterology) Harlen Labs, MD as Referring Physician (Optometry)  Advanced Directives 01/23/2020 10/07/2018 07/24/2015 03/28/2015 05/10/2014 03/22/2014  Does Patient Have a Medical Advance Directive? No No No No Patient would not like information Patient does not have advance directive;Patient would not like information  Would patient like information on creating a medical advance directive? No - Patient declined No - Patient declined - Yes - Scientist, clinical (histocompatibility and immunogenetics) given - -  Pre-existing out of facility DNR order (yellow form or pink MOST form) - - - - - No    Hospital Utilization Over the Past 12 Months: # of hospitalizations or ER visits: 0 # of surgeries: 0  Review of Systems    Patient reports that her overall health is unchanged compared to last year.  History  obtained from the patient.  Patient Reported Readings (BP, Pulse, CBG, Weight, etc) none  Pain Assessment Pain : No/denies pain     Current Medications & Allergies (verified) Allergies as of 01/23/2020       Reactions   Aspirin    Must take EC, prior history of stomach ulcer. Other reaction(s): Angioedema (ALLERGY/intolerance) Must take EC, prior history of stomach ulcer.   Topiramate Other (See Comments)   hallucinations        Medication List        Accurate as of January 23, 2020  9:33 AM. If you have any questions, ask your nurse or doctor.          STOP taking these medications    Baclofen 5 MG Tabs   COLACE PO   gabapentin 600 MG tablet Commonly known as: NEURONTIN   predniSONE 10 MG (21) Tbpk tablet Commonly known as: STERAPRED UNI-PAK 21 TAB       TAKE these medications    Dexilant 60 MG capsule Generic drug: dexlansoprazole Take 1 capsule (60 mg total) by mouth daily.   hydrochlorothiazide 25 MG tablet Commonly known as: HYDRODIURIL Take 1 tablet (25 mg total) by mouth daily.   LUTEIN PO Take 1 tablet by mouth daily.   multivitamin capsule Take 1 capsule by mouth daily.   potassium chloride 10 MEQ tablet Commonly known as: KLOR-CON Take 1 tablet (10 mEq total) by mouth daily.   propranolol 10 MG tablet Commonly known as: INDERAL Take 1 tablet (10 mg total) by mouth 2 (two) times daily as needed.   simvastatin 40 MG tablet Commonly known as:  ZOCOR Take 1 tablet (40 mg total) by mouth daily.   VITAMIN D-3 PO Take 1 tablet by mouth daily. 2000 units        History (reviewed): Past Medical History:  Diagnosis Date   Arthritis    thumb and fingers   Cataract    Fibromyalgia    GERD (gastroesophageal reflux disease)    Hyperlipidemia    Hypertension    IBS (irritable bowel syndrome)    Memory changes    Osteopenia    Peripheral neuropathy    Ulcer    gastric   Vitamin D insufficiency    Past Surgical History:    Procedure Laterality Date   ABDOMINAL HYSTERECTOMY     uterus frist and 10 years later both ovaries removed   CHOLECYSTECTOMY     COLONOSCOPY N/A 03/22/2014   Procedure: COLONOSCOPY;  Surgeon: Rogene Houston, MD;  Location: [facility];  Service: Endoscopy;  Laterality: N/A;  930   EYE SURGERY Left 12/04/2017   Left eye cataract removal   OOPHORECTOMY     Family History  Problem Relation Age of Onset   Dementia Brother    Heart attack Brother    Heart failure Mother    Heart failure Father    Heart attack Father    Glaucoma Father    Heart failure Brother    Heart attack Brother    Heart failure Sister    Heart attack Sister    Diabetes Sister    Obesity Sister    Cancer Brother        LYMPHOMA   Heart attack Brother    Cancer Brother        lymphoma   COPD Sister        from 2nd hand smoke - never a smoker   Osteoporosis Sister    Thyroid disease Son    Social History   Socioeconomic History   Marital status: Married    Spouse name: [name]   Number of children: 2   Years of education: 12   Highest education level: High school graduate  Occupational History   Occupation: Retired    Fish farm manager: [employer]  Tobacco Use   Smoking status: Former Smoker    Packs/day: 1.50    Years: 30.00    Pack years: 45.00    Types: Cigarettes    Quit date: 11/03/1988    Years since quitting: 31.2   Smokeless tobacco: Never Used  Substance and Sexual Activity   Alcohol use: No   Drug use: No   Sexual activity: Yes    Partners: Male  Other Topics Concern   Not on file  Social History Narrative   Lives with husband.    Social Determinants of Health   Financial Resource Strain:    Difficulty of Paying Living Expenses:   Food Insecurity:    Worried About Charity fundraiser in the Last Year:    Arboriculturist in the Last Year:   Transportation Needs:    Film/video editor (Medical):    Lack of Transportation (Non-Medical):   Physical Activity:    Days of  Exercise per Week:    Minutes of Exercise per Session:   Stress:    Feeling of Stress :   Social Connections:    Frequency of Communication with Friends and Family:    Frequency of Social Gatherings with Friends and Family:    Attends Religious Services:    Active Member of Clubs or  Organizations:    Attends Music therapist:    Marital Status:     Activities of Daily Living In your present state of health, do you have any difficulty performing the following activities: 01/23/2020  Hearing? Y  Comment states a little difficulty hearing  Vision? Y  Comment macular degeneration  Difficulty concentrating or making decisions? Y  Comment states a little memory loss  Walking or climbing stairs? Y  Comment "i do not do stairs"  Dressing or bathing? N  Doing errands, shopping? N  Preparing Food and eating ? Y  Comment i cannot stand in one spot for extended periods of time  Using the Toilet? N  In the past six months, have you accidently leaked urine? N  Do you have problems with loss of bowel control? N  Managing your Medications? N  Managing your Finances? N  Housekeeping or managing your Housekeeping? Y  Comment has help with housekeeping  Some recent data might be hidden    Patient Education/ Literacy How often do you need to have someone help you when you read instructions, pamphlets, or other written materials from your doctor or pharmacy?: 1 - Never What is the last grade level you completed in school?: 12th  Exercise Current Exercise Habits: The patient does not participate in regular exercise at present, Exercise limited by: orthopedic condition(s)(back pain)  Diet Patient reports consuming 1 meals a day and 2 snack(s) a day Patient reports that her primary diet is: Regular Patient reports that she does have regular access to food.   Depression Screen PHQ 2/9 Scores 01/23/2020 01/02/2020 02/15/2019 12/15/2018 11/11/2018 10/07/2018 10/04/2018  PHQ - 2 Score 0 0 1  0 0 0 0     Fall Risk Fall Risk  01/23/2020 01/02/2020 02/15/2019 12/15/2018 11/11/2018  Falls in the past year? 0 0 0 0 0  Number falls in past yr: - - - - -  Injury with Fall? - - - - -  Comment - - - - -  Risk for fall due to : - - - - -  Risk for fall due to: Comment - - - - -  Follow up - - - - -  Comment - - - - -     Objective:  Charlotte Aguirre seemed alert and oriented and she participated appropriately during our telephone visit.  Blood Pressure Weight BMI  BP Readings from Last 3 Encounters:  01/02/20 130/75  10/12/19 122/78  08/03/19 120/78   Wt Readings from Last 3 Encounters:  01/02/20 181 lb 6.4 oz (82.3 kg)  10/12/19 190 lb (86.2 kg)  08/03/19 185 lb (83.9 kg)   BMI Readings from Last 1 Encounters:  01/02/20 33.18 kg/m    *Unable to obtain current vital signs, weight, and BMI due to telephone visit type  Hearing/Vision  Stanton Kidney did not seem to have difficulty with hearing/understanding during the telephone conversation Reports that she has had a formal eye exam by an eye care professional within the past year Reports that she has not had a formal hearing evaluation within the past year *Unable to fully assess hearing and vision during telephone visit type  Cognitive Function: 6CIT Screen 01/23/2020  What Year? 0 points  What month? 0 points  What time? 0 points  Count back from 20 0 points  Months in reverse 0 points  Repeat phrase 0 points  Total Score 0   (Normal:0-7, Significant for Dysfunction: >8)  Normal Cognitive Function Screening: Yes  Immunization & Health Maintenance Record Immunization History  Administered Date(s) Administered   Fluad Quad(high Dose 65+) 07/08/2019   Influenza, High Dose Seasonal PF 07/24/2014, 08/07/2016, 08/11/2017, 08/31/2018   Influenza,inj,Quad PF,6+ Mos 08/21/2015   Pneumococcal Conjugate-13 03/28/2015   Pneumococcal Polysaccharide-23 02/02/2014    Health Maintenance  Topic Date Due   TETANUS/TDAP  Never done    DEXA SCAN  04/29/2019   MAMMOGRAM  09/15/2019   COLONOSCOPY  03/22/2024   INFLUENZA VACCINE  Completed   Hepatitis C Screening  Completed   PNA vac Low Risk Adult  Completed       Assessment  This is a routine wellness examination for [employer].  Health Maintenance: Due or Overdue Health Maintenance Due  Topic Date Due   TETANUS/TDAP  Never done   DEXA SCAN  04/29/2019   MAMMOGRAM  09/15/2019    Charlotte Cruel Bisson does not need a referral for Community Assistance: Care Management:   no Social Work:    no Prescription Assistance:  no Nutrition/Diabetes Education:  no   Plan:  Personalized Goals Goals Addressed             This Visit's Progress    Patient Stated       01/23/2020 AWV Goal: Exercise for General Health  Patient will verbalize understanding of the benefits of increased physical activity: Exercising regularly is important. It will improve your overall fitness, flexibility, and endurance. Regular exercise also will improve your overall health. It can help you control your weight, reduce stress, and improve your bone density. Over the next year, patient will increase physical activity as tolerated with a goal of at least 150 minutes of moderate physical activity per week.  You can tell that you are exercising at a moderate intensity if your heart starts beating faster and you start breathing faster but can still hold a conversation. Moderate-intensity exercise ideas include: Walking 1 mile (1.6 km) in about 15 minutes Biking Hiking Golfing Dancing Water aerobics Patient will verbalize understanding of everyday activities that increase physical activity by providing examples like the following: Yard work, such as: Sales promotion account executive Gardening Washing windows or floors Patient will be able to explain general safety guidelines for exercising:  Before you start a new exercise  program, talk with your health care provider. Do not exercise so much that you hurt yourself, feel dizzy, or get very short of breath. Wear comfortable clothes and wear shoes with good support. Drink plenty of water while you exercise to prevent dehydration or heat stroke. Work out until your breathing and your heartbeat get faster.        Personalized Health Maintenance & Screening Recommendations  Screening mammography, Dexa Scan, Tdap  Lung Cancer Screening Recommended: no (Low Dose CT Chest recommended if Age 71-80 years, 30 pack-year currently smoking OR have quit w/in past 15 years) Hepatitis C Screening recommended: no HIV Screening recommended: no  Advanced Directives: Written information was not prepared per patient's request.  Referrals & Orders No orders of the defined types were placed in this encounter.   Follow-up Plan Follow-up with Sharion Balloon, FNP as planned Schedule Dexa Scan, Mammogram   I have personally reviewed and noted the following in the patient's chart:   Medical and social history Use of alcohol, tobacco or illicit drugs  Current medications and supplements Functional ability and status Nutritional status Physical activity Advanced directives List of other physicians  Hospitalizations, surgeries, and ER visits in previous 12 months Vitals Screenings to include cognitive, depression, and falls Referrals and appointments  In addition, I have reviewed and discussed with Charlotte Cruel Stecher certain preventive protocols, quality metrics, and best practice recommendations. A written personalized care plan for preventive services as well as general preventive health recommendations is available and can be mailed to the patient at her request.      Baldomero Lamy, LPN  [id number]  I have reviewed and agree with the above AWV documentation.   Evelina Dun, FNP

## 2020-01-26 ENCOUNTER — Encounter: Payer: Self-pay | Admitting: Family

## 2020-01-26 ENCOUNTER — Ambulatory Visit (INDEPENDENT_AMBULATORY_CARE_PROVIDER_SITE_OTHER): Payer: Medicare HMO

## 2020-01-26 ENCOUNTER — Ambulatory Visit: Payer: Medicare HMO

## 2020-01-26 ENCOUNTER — Ambulatory Visit (INDEPENDENT_AMBULATORY_CARE_PROVIDER_SITE_OTHER): Payer: Medicare HMO | Admitting: Family

## 2020-01-26 ENCOUNTER — Other Ambulatory Visit: Payer: Self-pay

## 2020-01-26 VITALS — BP 124/69 | HR 83 | Temp 96.2°F | Ht 62.0 in | Wt 181.2 lb

## 2020-01-26 DIAGNOSIS — M1990 Unspecified osteoarthritis, unspecified site: Secondary | ICD-10-CM | POA: Diagnosis not present

## 2020-01-26 DIAGNOSIS — K5900 Constipation, unspecified: Secondary | ICD-10-CM | POA: Diagnosis not present

## 2020-01-26 DIAGNOSIS — Z780 Asymptomatic menopausal state: Secondary | ICD-10-CM

## 2020-01-26 DIAGNOSIS — E669 Obesity, unspecified: Secondary | ICD-10-CM | POA: Diagnosis not present

## 2020-01-26 DIAGNOSIS — I10 Essential (primary) hypertension: Secondary | ICD-10-CM | POA: Diagnosis not present

## 2020-01-26 DIAGNOSIS — E559 Vitamin D deficiency, unspecified: Secondary | ICD-10-CM | POA: Diagnosis not present

## 2020-01-26 DIAGNOSIS — R198 Other specified symptoms and signs involving the digestive system and abdomen: Secondary | ICD-10-CM

## 2020-01-26 DIAGNOSIS — K5901 Slow transit constipation: Secondary | ICD-10-CM | POA: Diagnosis not present

## 2020-01-26 DIAGNOSIS — E785 Hyperlipidemia, unspecified: Secondary | ICD-10-CM

## 2020-01-26 DIAGNOSIS — K219 Gastro-esophageal reflux disease without esophagitis: Secondary | ICD-10-CM

## 2020-01-26 LAB — CMP14+EGFR
ALT: 7 IU/L (ref 0–32)
AST: 14 IU/L (ref 0–40)
Albumin/Globulin Ratio: 1.7 (ref 1.2–2.2)
Albumin: 3.8 g/dL (ref 3.7–4.7)
Alkaline Phosphatase: 125 IU/L — ABNORMAL HIGH (ref 39–117)
BUN/Creatinine Ratio: 18 (ref 12–28)
BUN: 14 mg/dL (ref 8–27)
Bilirubin Total: 0.3 mg/dL (ref 0.0–1.2)
CO2: 27 mmol/L (ref 20–29)
Calcium: 9.6 mg/dL (ref 8.7–10.3)
Chloride: 100 mmol/L (ref 96–106)
Creatinine, Ser: 0.76 mg/dL (ref 0.57–1.00)
GFR calc Af Amer: 89 mL/min/{1.73_m2} (ref >59)
GFR calc non Af Amer: 77 mL/min/{1.73_m2} (ref >59)
Globulin, Total: 2.3 g/dL (ref 1.5–4.5)
Glucose: 94 mg/dL (ref 65–99)
Potassium: 3.5 mmol/L (ref 3.5–5.2)
Sodium: 143 mmol/L (ref 134–144)
Total Protein: 6.1 g/dL (ref 6.0–8.5)

## 2020-01-26 LAB — CBC WITH DIFFERENTIAL/PLATELET
Basophils Absolute: 0.1 10*3/uL (ref 0.0–0.2)
Basos: 1 %
EOS (ABSOLUTE): 0.2 10*3/uL (ref 0.0–0.4)
Eos: 3 %
Hematocrit: 44.3 % (ref 34.0–46.6)
Hemoglobin: 14.2 g/dL (ref 11.1–15.9)
Immature Grans (Abs): 0 10*3/uL (ref 0.0–0.1)
Immature Granulocytes: 0 %
Lymphocytes Absolute: 2.4 10*3/uL (ref 0.7–3.1)
Lymphs: 32 %
MCH: 26.9 pg (ref 26.6–33.0)
MCHC: 32.1 g/dL (ref 31.5–35.7)
MCV: 84 fL (ref 79–97)
Monocytes Absolute: 0.4 10*3/uL (ref 0.1–0.9)
Monocytes: 6 %
Neutrophils Absolute: 4.3 10*3/uL (ref 1.4–7.0)
Neutrophils: 58 %
Platelets: 246 10*3/uL (ref 150–450)
RBC: 5.27 x10E6/uL (ref 3.77–5.28)
RDW: 14.1 % (ref 11.7–15.4)
WBC: 7.5 10*3/uL (ref 3.4–10.8)

## 2020-01-26 LAB — LIPID PANEL
Chol/HDL Ratio: 4.3 ratio (ref 0.0–4.4)
Cholesterol, Total: 170 mg/dL (ref 100–199)
HDL: 40 mg/dL (ref >39)
LDL Chol Calc (NIH): 97 mg/dL (ref 0–99)
Triglycerides: 190 mg/dL — ABNORMAL HIGH (ref 0–149)
VLDL Cholesterol Cal: 33 mg/dL (ref 5–40)

## 2020-01-26 NOTE — Progress Notes (Signed)
Subjective:    Patient ID: Charlotte Aguirre, female    DOB: 1946-04-13, 74 y.o.   MRN: [id number]  Chief Complaint  Patient presents with  . Medical Management of Chronic Issues   Pt presents to the office today for chronic follow up.  Hypertension This is a chronic problem. The current episode started more than 1 year ago. The problem has been resolved since onset. The problem is controlled. Pertinent negatives include no malaise/fatigue, peripheral edema or shortness of breath. Risk factors for coronary artery disease include dyslipidemia, obesity and sedentary lifestyle. The current treatment provides moderate improvement. There is no history of kidney disease or CAD/MI.  Constipation This is a chronic problem. The current episode started more than 1 year ago. Her stool frequency is 4 to 5 times per week. Risk factors include obesity. She has tried laxatives for the symptoms. The treatment provided moderate relief.  Gastroesophageal Reflux She complains of belching and heartburn. This is a chronic problem. The current episode started more than 1 year ago. The problem occurs occasionally. The problem has been waxing and waning. The symptoms are aggravated by certain foods. She has tried a PPI for the symptoms. The treatment provided moderate relief.  Arthritis Presents for follow-up visit. She complains of pain and stiffness. The symptoms have been stable. Affected locations include the right knee and left knee. Her pain is at a severity of 0/10.  Hyperlipidemia This is a chronic problem. The current episode started more than 1 year ago. The problem is controlled. Recent lipid tests were reviewed and are normal. Exacerbating diseases include obesity. Pertinent negatives include no shortness of breath. Current antihyperlipidemic treatment includes statins. The current treatment provides moderate improvement of lipids.      Review of Systems  Constitutional: Negative for malaise/fatigue.    Respiratory: Negative for shortness of breath.   Gastrointestinal: Positive for constipation and heartburn.  Musculoskeletal: Positive for arthritis and stiffness.  All other systems reviewed and are negative.      Objective:   Physical Exam Vitals reviewed.  Constitutional:      General: She is not in acute distress.    Appearance: She is well-developed.  HENT:     Head: Normocephalic and atraumatic.     Right Ear: Tympanic membrane and external ear normal.     Left Ear: Tympanic membrane normal.  Eyes:     Pupils: Pupils are equal, round, and reactive to light.  Neck:     Thyroid: No thyromegaly.  Cardiovascular:     Rate and Rhythm: Normal rate and regular rhythm.     Heart sounds: Normal heart sounds. No murmur.  Pulmonary:     Effort: Pulmonary effort is normal. No respiratory distress.     Breath sounds: Normal breath sounds. No wheezing.  Abdominal:     General: Bowel sounds are normal. There is no distension.     Palpations: Abdomen is soft.     Tenderness: There is no abdominal tenderness.  Musculoskeletal:        General: No tenderness. Normal range of motion.     Cervical back: Normal range of motion and neck supple.  Skin:    General: Skin is warm and dry.  Neurological:     Mental Status: She is alert and oriented to person, place, and time.     Cranial Nerves: No cranial nerve deficit.     Deep Tendon Reflexes: Reflexes are normal and symmetric.  Psychiatric:  Behavior: Behavior normal.        Thought Content: Thought content normal.        Judgment: Judgment normal.       BP 124/69   Pulse 83   Temp (!) 96.2 F (35.7 C) (Temporal)   Ht 5' 2"  (1.575 m)   Wt 181 lb 3.2 oz (82.2 kg)   SpO2 99%   BMI 33.14 kg/m      Assessment & Plan:  [name] comes in today with chief complaint of Medical Management of Chronic Issues   Diagnosis and orders addressed:  1. Essential hypertension - CMP14+EGFR - CBC with  Differential/Platelet  2. Gastroesophageal reflux disease without esophagitis - CMP14+EGFR - CBC with Differential/Platelet  3. Slow transit constipation - DG Abd 1 View; Future - CMP14+EGFR - CBC with Differential/Platelet  4. Osteoarthritis, unspecified osteoarthritis type, unspecified site - CMP14+EGFR - CBC with Differential/Platelet  5. Hyperlipidemia with target LDL less than 100 - CMP14+EGFR - CBC with Differential/Platelet - Lipid panel  6. Vitamin D deficiency - DG [facility] DEXA - CMP14+EGFR - CBC with Differential/Platelet  7. Obesity (BMI 30-39.9) - CMP14+EGFR - CBC with Differential/Platelet  8. Post-menopause - DG [facility] DEXA - CMP14+EGFR - CBC with Differential/Platelet  9. Abdominal fullness - DG Abd 1 View; Future - CMP14+EGFR - CBC with Differential/Platelet   Labs pending Health Maintenance reviewed Diet and exercise encouraged  Follow up plan: 6 months    Evelina Dun, FNP

## 2020-01-26 NOTE — Patient Instructions (Signed)

## 2020-01-27 ENCOUNTER — Other Ambulatory Visit: Payer: Self-pay | Admitting: Family

## 2020-02-07 ENCOUNTER — Encounter: Payer: Self-pay | Admitting: Family

## 2020-02-07 ENCOUNTER — Ambulatory Visit (INDEPENDENT_AMBULATORY_CARE_PROVIDER_SITE_OTHER): Payer: Medicare HMO | Admitting: Family

## 2020-02-07 DIAGNOSIS — Z1211 Encounter for screening for malignant neoplasm of colon: Secondary | ICD-10-CM

## 2020-02-07 DIAGNOSIS — R1030 Lower abdominal pain, unspecified: Secondary | ICD-10-CM

## 2020-02-07 DIAGNOSIS — K5900 Constipation, unspecified: Secondary | ICD-10-CM

## 2020-02-07 LAB — URINALYSIS, COMPLETE
Bilirubin, UA: NEGATIVE
Glucose, UA: NEGATIVE
Ketones, UA: NEGATIVE
Nitrite, UA: NEGATIVE
Protein,UA: NEGATIVE
Specific Gravity, UA: 1.025 (ref 1.005–1.030)
Urobilinogen, Ur: 0.2 mg/dL (ref 0.2–1.0)
pH, UA: 7 (ref 5.0–7.5)

## 2020-02-07 LAB — MICROSCOPIC EXAMINATION
Epithelial Cells (non renal): >10 /hpf — AB (ref 0–10)
Renal Epithel, UA: NONE SEEN /hpf

## 2020-02-07 NOTE — Addendum Note (Signed)
Addended by: Earlene Plater on: 02/07/2020 10:03 AM   Modules accepted: Orders

## 2020-02-07 NOTE — Progress Notes (Signed)
   Virtual Visit via telephone Note Due to COVID-19 pandemic this visit was conducted virtually. This visit type was conducted due to national recommendations for restrictions regarding the COVID-19 Pandemic (e.g. social distancing, sheltering in place) in an effort to limit this patient's exposure and mitigate transmission in our community. All issues noted in this document were discussed and addressed.  A physical exam was not performed with this format.  I connected with Charlotte Aguirre on 02/07/20 at 8:36 AM by telephone and verified that I am speaking with the correct person using two identifiers. Charlotte Aguirre is currently located at home and no one is currently with her during visit. The provider, Evelina Dun, FNP is located in their office at time of visit.  I discussed the limitations, risks, security and privacy concerns of performing an evaluation and management service by telephone and the availability of in person appointments. I also discussed with the patient that there may be a patient responsible charge related to this service. The patient expressed understanding and agreed to proceed.   History and Present Illness:  Pt calls the office today to follow up with constipation. She had a KUB completed on 01/26/20 that showed mild colonic stool burden. She used a laxative for 5 days straight and had a large BM every day. She reports on the 5th day her stool was dark and has a lot of mucus. She reports she had one normal stool since then. She reports intermittent lower abdominal pain of 4 out 10 and is unsure if this is coming from her colon or bladder. Denies any UTI symptoms. Constipation This is a new problem. The current episode started 1 to 4 weeks ago. The problem has been waxing and waning since onset.      Review of Systems  Gastrointestinal: Positive for constipation.     Observations/Objective: No SOB or distress noted   Assessment and Plan: Charlotte Aguirre comes in today with  chief complaint of No chief complaint on file.   Diagnosis and orders addressed:  1. Constipation, unspecified constipation type Continue Miralax  Force fluids Encouraged high fiber diet and exercise - CBC with Differential/Platelet; Future - BMP8+EGFR; Future - Urinalysis, Complete; Future - Urine Culture; Future - Ambulatory referral to Gastroenterology  2. Lower abdominal pain - CBC with Differential/Platelet; Future - BMP8+EGFR; Future - Urinalysis, Complete; Future - Urine Culture; Future - Ambulatory referral to Gastroenterology  3. Colon cancer screening - Cologuard   Labs pending Health Maintenance reviewed Diet and exercise encouraged    I discussed the assessment and treatment plan with the patient. The patient was provided an opportunity to ask questions and all were answered. The patient agreed with the plan and demonstrated an understanding of the instructions.   The patient was advised to call back or seek an in-person evaluation if the symptoms worsen or if the condition fails to improve as anticipated.  The above assessment and management plan was discussed with the patient. The patient verbalized understanding of and has agreed to the management plan. Patient is aware to call the clinic if symptoms persist or worsen. Patient is aware when to return to the clinic for a follow-up visit. Patient educated on when it is appropriate to go to the emergency department.   Time call ended:  8:55 AM  I provided 19 minutes of non-face-to-face time during this encounter.    Evelina Dun, FNP

## 2020-02-08 LAB — CBC WITH DIFFERENTIAL/PLATELET
Basophils Absolute: 0.1 10*3/uL (ref 0.0–0.2)
Basos: 1 %
EOS (ABSOLUTE): 0.2 10*3/uL (ref 0.0–0.4)
Eos: 2 %
Hematocrit: 43 % (ref 34.0–46.6)
Hemoglobin: 13.8 g/dL (ref 11.1–15.9)
Immature Grans (Abs): 0 10*3/uL (ref 0.0–0.1)
Immature Granulocytes: 1 %
Lymphocytes Absolute: 2.6 10*3/uL (ref 0.7–3.1)
Lymphs: 32 %
MCH: 27 pg (ref 26.6–33.0)
MCHC: 32.1 g/dL (ref 31.5–35.7)
MCV: 84 fL (ref 79–97)
Monocytes Absolute: 0.5 10*3/uL (ref 0.1–0.9)
Monocytes: 6 %
Neutrophils Absolute: 4.8 10*3/uL (ref 1.4–7.0)
Neutrophils: 58 %
Platelets: 274 10*3/uL (ref 150–450)
RBC: 5.11 x10E6/uL (ref 3.77–5.28)
RDW: 13.7 % (ref 11.7–15.4)
WBC: 8.1 10*3/uL (ref 3.4–10.8)

## 2020-02-08 LAB — BMP8+EGFR
BUN/Creatinine Ratio: 21 (ref 12–28)
BUN: 16 mg/dL (ref 8–27)
CO2: 26 mmol/L (ref 20–29)
Calcium: 9.2 mg/dL (ref 8.7–10.3)
Chloride: 102 mmol/L (ref 96–106)
Creatinine, Ser: 0.77 mg/dL (ref 0.57–1.00)
GFR calc Af Amer: 87 mL/min/{1.73_m2} (ref >59)
GFR calc non Af Amer: 76 mL/min/{1.73_m2} (ref >59)
Glucose: 105 mg/dL — ABNORMAL HIGH (ref 65–99)
Potassium: 3.7 mmol/L (ref 3.5–5.2)
Sodium: 145 mmol/L — ABNORMAL HIGH (ref 134–144)

## 2020-02-08 LAB — URINE CULTURE

## 2020-02-24 ENCOUNTER — Other Ambulatory Visit: Payer: Self-pay | Admitting: Family

## 2020-02-24 DIAGNOSIS — K581 Irritable bowel syndrome with constipation: Secondary | ICD-10-CM

## 2020-02-24 DIAGNOSIS — K5901 Slow transit constipation: Secondary | ICD-10-CM

## 2020-02-24 DIAGNOSIS — K219 Gastro-esophageal reflux disease without esophagitis: Secondary | ICD-10-CM

## 2020-03-06 DIAGNOSIS — H25811 Combined forms of age-related cataract, right eye: Secondary | ICD-10-CM | POA: Diagnosis not present

## 2020-03-06 DIAGNOSIS — H35372 Puckering of macula, left eye: Secondary | ICD-10-CM | POA: Diagnosis not present

## 2020-03-06 DIAGNOSIS — H353131 Nonexudative age-related macular degeneration, bilateral, early dry stage: Secondary | ICD-10-CM | POA: Diagnosis not present

## 2020-03-06 DIAGNOSIS — Z961 Presence of intraocular lens: Secondary | ICD-10-CM | POA: Diagnosis not present

## 2020-03-15 ENCOUNTER — Other Ambulatory Visit: Payer: Self-pay | Admitting: Family

## 2020-03-15 DIAGNOSIS — E785 Hyperlipidemia, unspecified: Secondary | ICD-10-CM

## 2020-03-19 ENCOUNTER — Encounter: Payer: Self-pay | Admitting: Internal Medicine

## 2020-03-28 ENCOUNTER — Other Ambulatory Visit: Payer: Medicare HMO

## 2020-03-28 DIAGNOSIS — Z1231 Encounter for screening mammogram for malignant neoplasm of breast: Secondary | ICD-10-CM | POA: Diagnosis not present

## 2020-03-29 ENCOUNTER — Ambulatory Visit (INDEPENDENT_AMBULATORY_CARE_PROVIDER_SITE_OTHER): Payer: Medicare HMO

## 2020-03-29 ENCOUNTER — Other Ambulatory Visit: Payer: Medicare HMO

## 2020-03-29 ENCOUNTER — Other Ambulatory Visit: Payer: Self-pay

## 2020-03-29 DIAGNOSIS — M25551 Pain in right hip: Secondary | ICD-10-CM

## 2020-03-29 DIAGNOSIS — M7061 Trochanteric bursitis, right hip: Secondary | ICD-10-CM | POA: Diagnosis not present

## 2020-04-10 ENCOUNTER — Ambulatory Visit (INDEPENDENT_AMBULATORY_CARE_PROVIDER_SITE_OTHER): Payer: Medicare HMO | Admitting: Family Medicine

## 2020-04-10 ENCOUNTER — Ambulatory Visit: Payer: Medicare HMO | Admitting: Family Medicine

## 2020-04-10 ENCOUNTER — Encounter: Payer: Self-pay | Admitting: Family Medicine

## 2020-04-10 DIAGNOSIS — R519 Headache, unspecified: Secondary | ICD-10-CM | POA: Diagnosis not present

## 2020-04-10 DIAGNOSIS — R42 Dizziness and giddiness: Secondary | ICD-10-CM

## 2020-04-10 NOTE — Progress Notes (Signed)
Virtual Visit via Telephone Note  I connected with Charlotte Aguirre on 04/10/20 at 3:40 PM by telephone and verified that I am speaking with the correct person using two identifiers. Charlotte Aguirre is currently located at home and nobody is currently with her during this visit. The provider, Loman Brooklyn, FNP is located in their home at time of visit.  I discussed the limitations, risks, security and privacy concerns of performing an evaluation and management service by telephone and the availability of in person appointments. I also discussed with the patient that there may be a patient responsible charge related to this service. The patient expressed understanding and agreed to proceed.  Subjective: PCP: Sharion Balloon, FNP  Chief Complaint  Patient presents with  . Dizziness  . Headache   Patient reports 8 days ago she had an episode of vertigo that was so bad it made her nauseated to the point that she actually vomited.  She took meclizine 25 mg which was effective.  Over the past weeks she has had several days of feeling lightheaded.  She is also had a headache at the back of her head and her nose.  She has had both of her COVID-19 vaccines.   ROS: Per HPI  Current Outpatient Medications:  .  Cholecalciferol (VITAMIN D-3 PO), Take 1 tablet by mouth daily. 2000 units, Disp: , Rfl:  .  dexlansoprazole (DEXILANT) 60 MG capsule, Take 1 capsule (60 mg total) by mouth daily., Disp: 90 capsule, Rfl: 2 .  hydrochlorothiazide (HYDRODIURIL) 25 MG tablet, Take 1 tablet (25 mg total) by mouth daily., Disp: 90 tablet, Rfl: 2 .  LUTEIN PO, Take 1 tablet by mouth daily., Disp: , Rfl:  .  Multiple Vitamin (MULTIVITAMIN) capsule, Take 1 capsule by mouth daily., Disp: , Rfl:  .  potassium chloride (K-DUR) 10 MEQ tablet, Take 1 tablet (10 mEq total) by mouth daily., Disp: 180 tablet, Rfl: 1 .  propranolol (INDERAL) 10 MG tablet, Take 1 tablet (10 mg total) by mouth 2 (two) times daily as needed., Disp:  60 tablet, Rfl: 6 .  simvastatin (ZOCOR) 40 MG tablet, TAKE 1 TABLET BY MOUTH EVERY DAY, Disp: 90 tablet, Rfl: 0  Allergies  Allergen Reactions  . Aspirin     Must take EC, prior history of stomach ulcer. Other reaction(s): Angioedema (ALLERGY/intolerance) Must take EC, prior history of stomach ulcer.  . Topiramate Other (See Comments)    hallucinations   Past Medical History:  Diagnosis Date  . Arthritis    thumb and fingers  . Cataract   . Fibromyalgia   . GERD (gastroesophageal reflux disease)   . Hyperlipidemia   . Hypertension   . IBS (irritable bowel syndrome)   . Memory changes   . Osteopenia   . Peripheral neuropathy   . Ulcer    gastric  . Vitamin D insufficiency     Observations/Objective: A&O  No respiratory distress or wheezing audible over the phone Mood, judgement, and thought processes all WNL   Assessment and Plan: 1-2. Dizziness/headaches - Discussed her symptoms could be her allergies since she is not currently taking anything.  Encouraged to start Flonase in the a.m. and Xyzal in the p.m. she will schedule a COVID-19 test if this is not effective.   Follow Up Instructions:  I discussed the assessment and treatment plan with the patient. The patient was provided an opportunity to ask questions and all were answered. The patient agreed with the plan and  demonstrated an understanding of the instructions.   The patient was advised to call back or seek an in-person evaluation if the symptoms worsen or if the condition fails to improve as anticipated.  The above assessment and management plan was discussed with the patient. The patient verbalized understanding of and has agreed to the management plan. Patient is aware to call the clinic if symptoms persist or worsen. Patient is aware when to return to the clinic for a follow-up visit. Patient educated on when it is appropriate to go to the emergency department.   Time call ended: 3:55 PM  I provided 17  minutes of non-face-to-face time during this encounter.  Hendricks Limes, MSN, APRN, FNP-C [facility] 04/10/20

## 2020-04-11 ENCOUNTER — Ambulatory Visit: Payer: Medicare HMO | Admitting: Internal Medicine

## 2020-04-18 ENCOUNTER — Ambulatory Visit: Payer: Medicare HMO | Admitting: Cardiology

## 2020-05-31 ENCOUNTER — Telehealth: Payer: Self-pay | Admitting: Family

## 2020-05-31 DIAGNOSIS — Z23 Encounter for immunization: Secondary | ICD-10-CM | POA: Diagnosis not present

## 2020-05-31 DIAGNOSIS — S61210A Laceration without foreign body of right index finger without damage to nail, initial encounter: Secondary | ICD-10-CM | POA: Diagnosis not present

## 2020-05-31 NOTE — Telephone Encounter (Signed)
Spoke with pt and advised we don't have a  TDAP on file and I also checked [facility] and no TDAP on registry either. Advised pt she would ntbs with a provider to look at the cut but we don't have any openings today or tomorrow. Advised pt she should go to UC and pt voiced understanding.

## 2020-06-07 ENCOUNTER — Other Ambulatory Visit: Payer: Self-pay | Admitting: Family

## 2020-06-07 DIAGNOSIS — E785 Hyperlipidemia, unspecified: Secondary | ICD-10-CM

## 2020-07-18 ENCOUNTER — Ambulatory Visit (INDEPENDENT_AMBULATORY_CARE_PROVIDER_SITE_OTHER): Payer: Medicare HMO

## 2020-07-18 DIAGNOSIS — Z780 Asymptomatic menopausal state: Secondary | ICD-10-CM

## 2020-07-19 DIAGNOSIS — Z780 Asymptomatic menopausal state: Secondary | ICD-10-CM | POA: Diagnosis not present

## 2020-07-23 DIAGNOSIS — R69 Illness, unspecified: Secondary | ICD-10-CM | POA: Diagnosis not present

## 2020-08-05 ENCOUNTER — Other Ambulatory Visit: Payer: Self-pay | Admitting: Family

## 2020-08-05 DIAGNOSIS — J301 Allergic rhinitis due to pollen: Secondary | ICD-10-CM

## 2020-08-08 ENCOUNTER — Ambulatory Visit: Payer: Medicare HMO

## 2020-08-22 ENCOUNTER — Encounter: Payer: Self-pay | Admitting: Family Medicine

## 2020-08-22 ENCOUNTER — Other Ambulatory Visit: Payer: Self-pay

## 2020-08-22 ENCOUNTER — Ambulatory Visit (INDEPENDENT_AMBULATORY_CARE_PROVIDER_SITE_OTHER): Payer: Medicare HMO | Admitting: Family Medicine

## 2020-08-22 VITALS — BP 135/64 | HR 75 | Temp 97.0°F | Ht 62.0 in | Wt 189.0 lb

## 2020-08-22 DIAGNOSIS — N12 Tubulo-interstitial nephritis, not specified as acute or chronic: Secondary | ICD-10-CM

## 2020-08-22 DIAGNOSIS — R109 Unspecified abdominal pain: Secondary | ICD-10-CM | POA: Diagnosis not present

## 2020-08-22 LAB — URINALYSIS, COMPLETE
Bilirubin, UA: NEGATIVE
Glucose, UA: NEGATIVE
Nitrite, UA: NEGATIVE
Protein,UA: NEGATIVE
RBC, UA: NEGATIVE
Specific Gravity, UA: >1.03 — ABNORMAL HIGH (ref 1.005–1.030)
Urobilinogen, Ur: 0.2 mg/dL (ref 0.2–1.0)
pH, UA: 5 (ref 5.0–7.5)

## 2020-08-22 LAB — MICROSCOPIC EXAMINATION: RBC, Urine: NONE SEEN /hpf (ref 0–2)

## 2020-08-22 MED ORDER — PHENAZOPYRIDINE HCL 200 MG PO TABS: 200.0000 mg | ORAL_TABLET | Freq: Three times a day (TID) | ORAL | 0 refills | Status: AC | PRN

## 2020-08-22 MED ORDER — FLUCONAZOLE 150 MG PO TABS: 150.0000 mg | ORAL_TABLET | Freq: Once | ORAL | 0 refills | Status: AC

## 2020-08-22 MED ORDER — CEFTRIAXONE SODIUM 1 G IJ SOLR
1.0000 g | Freq: Once | INTRAMUSCULAR | Status: AC
  Administered 2020-08-22: 1 g via INTRAMUSCULAR

## 2020-08-22 MED ORDER — CEPHALEXIN 500 MG PO CAPS: 500.0000 mg | ORAL_CAPSULE | Freq: Four times a day (QID) | ORAL | 0 refills | Status: AC

## 2020-08-22 NOTE — Patient Instructions (Signed)
Pyridium sent for the flank pain and urinary tract pain.  That should reduce the spasm You were given a dose of Rocephin via injection today.  This will cover you for the next 24 hours but I want you to start the oral capsules first thing in the morning.  You will take 1 capsule of cephalexin approximately every 6 hours.  If you develop any worsening symptoms, fevers, severe nausea, inability to keep fluids down, please seek immediate medical attention  Pyelonephritis, Adult  Pyelonephritis is an infection that occurs in the kidney. The kidneys are organs that help clean the blood by moving waste out of the blood and into the pee (urine). This infection can happen quickly, or it can last for a long time. In most cases, it clears up with treatment and does not cause other problems. What are the causes? This condition may be caused by:  Germs (bacteria) going from the bladder up to the kidney. This may happen after having a bladder infection.  Germs going from the blood to the kidney. What increases the risk? This condition is more likely to develop in:  Pregnant women.  Older people.  People who have any of these conditions: ? Diabetes. ? Inflammation of the prostate gland (prostatitis), in males. ? Kidney stones or bladder stones. ? Other problems with the kidney or the parts of your body that carry pee from the kidneys to the bladder (ureters). ? Cancer.  People who have a small, thin tube (catheter) placed in the bladder.  People who are sexually active.  Women who use a medicine that kills sperm (spermicide) to prevent pregnancy.  People who have had a prior urinary tract infection (UTI). What are the signs or symptoms? Symptoms of this condition include:  Peeing often.  A strong urge to pee right away.  Burning or stinging when peeing.  Belly pain.  Back pain.  Pain in the side (flank area).  Fever or chills.  Blood in the pee, or dark pee.  Feeling sick to  your stomach (nauseous) or throwing up (vomiting). How is this treated? This condition may be treated by:  Taking antibiotic medicines by mouth (orally).  Drinking enough fluids. If the infection is bad, you may need to stay in the hospital. You may be given antibiotics and fluids that are put directly into a vein through an IV tube. In some cases, other treatments may be needed. Follow these instructions at home: Medicines  Take your antibiotic medicine as told by your doctor. Do not stop taking the antibiotic even if you start to feel better.  Take over-the-counter and prescription medicines only as told by your doctor. General instructions   Drink enough fluid to keep your pee pale yellow.  Avoid caffeine, tea, and carbonated drinks.  Pee (urinate) often. Avoid holding in pee for long periods of time.  Pee before and after sex.  After pooping (having a bowel movement), women should wipe from front to back. Use each tissue only once.  Keep all follow-up visits as told by your doctor. This is important. Contact a doctor if:  You do not feel better after 2 days.  Your symptoms get worse.  You have a fever. Get help right away if:  You cannot take your medicine or drink fluids as told.  You have chills and shaking.  You throw up.  You have very bad pain in your side or back.  You feel very weak or you pass out (faint). Summary  Pyelonephritis is an infection that occurs in the kidney.  In most cases, this infection clears up with treatment and does not cause other problems.  Take your antibiotic medicine as told by your doctor. Do not stop taking the antibiotic even if you start to feel better.  Drink enough fluid to keep your pee pale yellow. This information is not intended to replace advice given to you by your health care provider. Make sure you discuss any questions you have with your health care provider. Document Revised: 08/24/2018 Document Reviewed:  08/24/2018 Elsevier Patient Education  2020 [employer].

## 2020-08-22 NOTE — Progress Notes (Signed)
Subjective: CC: side pain PCP: Sharion Balloon, FNP RXY:VOPF Charlotte Aguirre is a 74 y.o. female presenting to clinic today for:  1. Urinary symptoms Patient reports a 4 day Charlotte/o left-sided leg pain, decreased urine output, urinary frequency, urgency.  She had an episode of nausea secondary to the pain.  Denies hematuria, fevers, chills, abdominal pain, vomiting, vaginal discharge.  Patient has used nothing for symptoms.  Patient denies a Charlotte/o frequent or recurrent UTIs.    ROS: Per HPI  Allergies  Allergen Reactions  . Aspirin     Must take EC, prior history of stomach ulcer. Other reaction(s): Angioedema (ALLERGY/intolerance) Must take EC, prior history of stomach ulcer.  . Topiramate Other (See Comments)    hallucinations   Past Medical History:  Diagnosis Date  . Arthritis    thumb and fingers  . Cataract   . Fibromyalgia   . GERD (gastroesophageal reflux disease)   . Hyperlipidemia   . Hypertension   . IBS (irritable bowel syndrome)   . Memory changes   . Osteopenia   . Peripheral neuropathy   . Ulcer    gastric  . Vitamin D insufficiency     Current Outpatient Medications:  .  Cholecalciferol (VITAMIN D-3 PO), Take 1 tablet by mouth daily. 2000 units, Disp: , Rfl:  .  dexlansoprazole (DEXILANT) 60 MG capsule, Take 1 capsule (60 mg total) by mouth daily., Disp: 90 capsule, Rfl: 2 .  hydrochlorothiazide (HYDRODIURIL) 25 MG tablet, Take 1 tablet (25 mg total) by mouth daily., Disp: 90 tablet, Rfl: 2 .  LUTEIN PO, Take 1 tablet by mouth daily., Disp: , Rfl:  .  Multiple Vitamin (MULTIVITAMIN) capsule, Take 1 capsule by mouth daily., Disp: , Rfl:  .  potassium chloride (K-DUR) 10 MEQ tablet, Take 1 tablet (10 mEq total) by mouth daily., Disp: 180 tablet, Rfl: 1 .  propranolol (INDERAL) 10 MG tablet, Take 1 tablet (10 mg total) by mouth 2 (two) times daily as needed., Disp: 60 tablet, Rfl: 6 .  simvastatin (ZOCOR) 40 MG tablet, Take 1 tablet (40 mg total) by mouth daily. Needs  appointment for further refills, Disp: 90 tablet, Rfl: 0 Social History   Socioeconomic History  . Marital status: Married    Spouse name: [name]  . Number of children: 2  . Years of education: 50  . Highest education level: High school graduate  Occupational History  . Occupation: Retired    Fish farm manager: Secondary school teacher  Tobacco Use  . Smoking status: Former Smoker    Packs/day: 1.50    Years: 30.00    Pack years: 45.00    Types: Cigarettes    Quit date: 11/03/1988    Years since quitting: 31.8  . Smokeless tobacco: Never Used  Vaping Use  . Vaping Use: Never used  Substance and Sexual Activity  . Alcohol use: No  . Drug use: No  . Sexual activity: Yes    Partners: Male  Other Topics Concern  . Not on file  Social History Narrative   Retired. Lives with husband [name]. 2 sons, do not live locally. One small dog. She enjoys crocheting and quilting. Is involved in her church.   Social Determinants of Health   Financial Resource Strain:   . Difficulty of Paying Living Expenses: Not on file  Food Insecurity:   . Worried About Charity fundraiser in the Last Year: Not on file  . Ran Out of Food in the Last Year: Not on file  Transportation Needs:   . Film/video editor (Medical): Not on file  . Lack of Transportation (Non-Medical): Not on file  Physical Activity:   . Days of Exercise per Week: Not on file  . Minutes of Exercise per Session: Not on file  Stress:   . Feeling of Stress : Not on file  Social Connections:   . Frequency of Communication with Friends and Family: Not on file  . Frequency of Social Gatherings with Friends and Family: Not on file  . Attends Religious Services: Not on file  . Active Member of Clubs or Organizations: Not on file  . Attends Archivist Meetings: Not on file  . Marital Status: Not on file  Intimate Partner Violence:   . Fear of Current or Ex-Partner: Not on file  . Emotionally Abused: Not on file  . Physically Abused:  Not on file  . Sexually Abused: Not on file   Family History  Problem Relation Age of Onset  . Dementia Brother   . Heart attack Brother   . Heart failure Mother   . Heart failure Father   . Heart attack Father   . Glaucoma Father   . Heart failure Brother   . Heart attack Brother   . Heart failure Sister   . Heart attack Sister   . Diabetes Sister   . Obesity Sister   . Cancer Brother        LYMPHOMA  . Heart attack Brother   . Cancer Brother        lymphoma  . COPD Sister        from 2nd hand smoke - never a smoker  . Osteoporosis Sister   . Thyroid disease Son     Objective: Office vital signs reviewed. BP 135/64   Pulse 75   Temp (!) 97 F (36.1 C) (Temporal)   Ht 5\' 2"  (1.575 m)   Wt 189 lb (85.7 kg)   SpO2 96%   BMI 34.57 kg/m   Physical Examination:  General: Awake, alert, appears uncomfortable, nontoxic GU: +left sided CVA tenderness  Assessment/ Plan: 74 y.o. female   1. Pyelonephritis Treat for pyelonephritis given flank pain and appreciable bacteria with 1+ leukocytes on urine dip.  Rocephin administered.  She will start Keflex tomorrow 4 times daily.  We discussed red flag signs and symptoms warranting further evaluation and reasons for emergent evaluation.  Push oral fluids.  Follow-up as needed - Urine Culture - cefTRIAXone (ROCEPHIN) injection 1 g - cephALEXin (KEFLEX) 500 MG capsule; Take 1 capsule (500 mg total) by mouth 4 (four) times daily for 7 days. Start 10/21.  Dispense: 28 capsule; Refill: 0 - phenazopyridine (PYRIDIUM) 200 MG tablet; Take 1 tablet (200 mg total) by mouth 3 (three) times daily as needed for up to 2 days for pain (urinary).  Dispense: 6 tablet; Refill: 0 - fluconazole (DIFLUCAN) 150 MG tablet; Take 1 tablet (150 mg total) by mouth once for 1 dose.  Dispense: 1 tablet; Refill: 0  2. Flank pain - Urinalysis, Complete - phenazopyridine (PYRIDIUM) 200 MG tablet; Take 1 tablet (200 mg total) by mouth 3 (three) times daily as  needed for up to 2 days for pain (urinary).  Dispense: 6 tablet; Refill: 0 - fluconazole (DIFLUCAN) 150 MG tablet; Take 1 tablet (150 mg total) by mouth once for 1 dose.  Dispense: 1 tablet; Refill: 0   No orders of the defined types were placed in this encounter.  No orders of the  defined types were placed in this encounter.    Janora Norlander, DO [place] [phone]

## 2020-08-24 ENCOUNTER — Telehealth: Payer: Self-pay

## 2020-08-24 DIAGNOSIS — N3000 Acute cystitis without hematuria: Secondary | ICD-10-CM | POA: Diagnosis not present

## 2020-08-24 DIAGNOSIS — R109 Unspecified abdominal pain: Secondary | ICD-10-CM | POA: Diagnosis not present

## 2020-08-24 DIAGNOSIS — R300 Dysuria: Secondary | ICD-10-CM | POA: Diagnosis not present

## 2020-08-24 NOTE — Telephone Encounter (Signed)
Patient seen Dr.G on 10/20 and was given a shot of rocephin and also stared keflex.  States she is not feeling any better since the visit.  States that she is having left sided flank pain that hurts so bad it is making her nauseous.  Please advise

## 2020-08-24 NOTE — Telephone Encounter (Signed)
Needs to be seen.  Recommend evaluation for renal stone.

## 2020-08-24 NOTE — Telephone Encounter (Signed)
Patient aware- no appointments today- patient states she will go to urgent care

## 2020-08-24 NOTE — Telephone Encounter (Signed)
Pt called stating that she was seen 2 days ago and was told that she has a kidney infection and was given an antibiotic shot in her butt. Says she doesn't feel any better and is in a lot of pain.  Please advise.

## 2020-08-26 LAB — URINE CULTURE

## 2020-08-29 ENCOUNTER — Other Ambulatory Visit: Payer: Self-pay | Admitting: Family

## 2020-08-29 DIAGNOSIS — E785 Hyperlipidemia, unspecified: Secondary | ICD-10-CM

## 2020-08-31 ENCOUNTER — Other Ambulatory Visit: Payer: Self-pay

## 2020-08-31 ENCOUNTER — Other Ambulatory Visit: Payer: Self-pay | Admitting: Family

## 2020-08-31 ENCOUNTER — Encounter: Payer: Self-pay | Admitting: Family

## 2020-08-31 ENCOUNTER — Ambulatory Visit (INDEPENDENT_AMBULATORY_CARE_PROVIDER_SITE_OTHER): Payer: Medicare HMO | Admitting: Family

## 2020-08-31 VITALS — BP 135/71 | HR 75 | Temp 97.0°F | Ht 62.0 in | Wt 185.2 lb

## 2020-08-31 DIAGNOSIS — K5901 Slow transit constipation: Secondary | ICD-10-CM | POA: Diagnosis not present

## 2020-08-31 DIAGNOSIS — G4700 Insomnia, unspecified: Secondary | ICD-10-CM | POA: Diagnosis not present

## 2020-08-31 DIAGNOSIS — R1032 Left lower quadrant pain: Secondary | ICD-10-CM

## 2020-08-31 NOTE — Patient Instructions (Signed)

## 2020-08-31 NOTE — Progress Notes (Signed)
Subjective:    Patient ID: Charlotte Aguirre, female    DOB: [birth year], 74 y.o.   MRN: [id number]  Chief Complaint  Patient presents with  . Constipation   Pt presents to the office today with recurrent abdominal pain. She was seen on 08/22/20 and diagnosed with pyelonephritis and given Keflex and Rocephin 1 mg.  She states her symptoms worsen and had to go to the Urgent Care on 08/24/20 and her Keflex was changed to Bactrim.   She states between her antibiotics she felt constipation. She has been taking MOM and suppositories and has had several BM's today. She is feeling better now.   She states her flank pain and UTI symptoms improved.  Constipation This is a chronic problem. The current episode started more than 1 year ago. The problem has been waxing and waning since onset. The stool is described as firm. Associated symptoms include abdominal pain and nausea. Pertinent negatives include no diarrhea. Risk factors include recent dehydration. She has tried laxatives and stool softeners for the symptoms. The treatment provided moderate relief.  Insomnia Primary symptoms: difficulty falling asleep, frequent awakening.  The current episode started more than one year. The onset quality is gradual. The problem occurs intermittently.      Review of Systems  Gastrointestinal: Positive for abdominal pain, constipation and nausea. Negative for diarrhea.  Psychiatric/Behavioral: The patient has insomnia.   All other systems reviewed and are negative.      Objective:   Physical Exam Vitals reviewed.  Constitutional:      General: She is not in acute distress.    Appearance: She is well-developed.  HENT:     Head: Normocephalic and atraumatic.  Eyes:     Pupils: Pupils are equal, round, and reactive to light.  Neck:     Thyroid: No thyromegaly.  Cardiovascular:     Rate and Rhythm: Normal rate and regular rhythm.     Heart sounds: Normal heart sounds. No murmur heard.   Pulmonary:      Effort: Pulmonary effort is normal. No respiratory distress.     Breath sounds: Normal breath sounds. No wheezing.  Abdominal:     General: Bowel sounds are normal. There is no distension.     Palpations: Abdomen is soft.     Tenderness: There is no abdominal tenderness (no tenderness noted ).  Musculoskeletal:        General: No tenderness. Normal range of motion.     Cervical back: Normal range of motion and neck supple.  Skin:    General: Skin is warm and dry.  Neurological:     Mental Status: She is alert and oriented to person, place, and time.     Cranial Nerves: No cranial nerve deficit.     Deep Tendon Reflexes: Reflexes are normal and symmetric.  Psychiatric:        Behavior: Behavior normal.        Thought Content: Thought content normal.        Judgment: Judgment normal.       BP 135/71   Pulse 75   Temp (!) 97 F (36.1 C) (Temporal)   Ht 5\' 2"  (1.575 m)   Wt 185 lb 3.2 oz (84 kg)   SpO2 96%   BMI 33.87 kg/m      Assessment & Plan:  Charlotte Aguirre comes in today with chief complaint of Constipation   Diagnosis and orders addressed: 1. Slow transit constipation Will start Miralax  Force fluids  2. Left lower quadrant abdominal pain  3. Insomnia, unspecified type Wants to try melatonin OTC Sleep ritual discussed    Evelina Dun, FNP

## 2020-09-03 DIAGNOSIS — H35372 Puckering of macula, left eye: Secondary | ICD-10-CM | POA: Diagnosis not present

## 2020-09-03 DIAGNOSIS — H25811 Combined forms of age-related cataract, right eye: Secondary | ICD-10-CM | POA: Diagnosis not present

## 2020-09-03 DIAGNOSIS — Z961 Presence of intraocular lens: Secondary | ICD-10-CM | POA: Diagnosis not present

## 2020-09-03 DIAGNOSIS — H353131 Nonexudative age-related macular degeneration, bilateral, early dry stage: Secondary | ICD-10-CM | POA: Diagnosis not present

## 2020-09-03 DIAGNOSIS — H532 Diplopia: Secondary | ICD-10-CM | POA: Diagnosis not present

## 2020-09-03 DIAGNOSIS — H5021 Vertical strabismus, right eye: Secondary | ICD-10-CM | POA: Diagnosis not present

## 2020-09-07 ENCOUNTER — Telehealth: Payer: Self-pay

## 2020-09-07 DIAGNOSIS — K5900 Constipation, unspecified: Secondary | ICD-10-CM

## 2020-09-07 DIAGNOSIS — R1084 Generalized abdominal pain: Secondary | ICD-10-CM

## 2020-09-07 NOTE — Telephone Encounter (Signed)
Referral placed.

## 2020-09-07 NOTE — Telephone Encounter (Signed)
REFERRAL REQUEST Telephone Note  Have you been seen at our office for this problem? Yes before covid hit. (Advise that they may need an appointment with their PCP before a referral can be done)  Reason for Referral: stomach pain Referral discussed with patient: yes Best contact number of patient for referral team: [phone] Has patient been seen by a specialist for this issue before: no but had appt and had to cancel due to Covid Patient provider preference for referral: she said we sent records to a dr but doesn't know name [place] for referral: [place]   Patient notified that referrals can take up to a week or longer to process. If they haven't heard anything within a week they should call back and speak with the referral department.

## 2020-09-08 ENCOUNTER — Other Ambulatory Visit: Payer: Self-pay | Admitting: Family

## 2020-09-08 DIAGNOSIS — I10 Essential (primary) hypertension: Secondary | ICD-10-CM

## 2020-09-09 ENCOUNTER — Other Ambulatory Visit: Payer: Self-pay | Admitting: Family

## 2020-09-09 DIAGNOSIS — K219 Gastro-esophageal reflux disease without esophagitis: Secondary | ICD-10-CM

## 2020-09-09 DIAGNOSIS — R1013 Epigastric pain: Secondary | ICD-10-CM

## 2020-09-13 ENCOUNTER — Telehealth: Payer: Self-pay

## 2020-09-13 MED ORDER — FLUCONAZOLE 150 MG PO TABS: 150.0000 mg | ORAL_TABLET | ORAL | 0 refills | Status: DC | PRN

## 2020-09-13 NOTE — Telephone Encounter (Signed)
REFERRAL REQUEST Telephone Note  Have you been seen at our office for this problem? yes (Advise that they may need an appointment with their PCP before a referral can be done)  Reason for Referral: GI Doctor Referral discussed with patient: yes Best contact number of patient for referral team:  [phone]  Has patient been seen by a specialist for this issue before: yes Patient provider preference for referral: ? Patient location preference for referral: ?   Patient notified that referrals can take up to a week or longer to process. If they haven't heard anything within a week they should call back and speak with the referral department.   Charlotte Aguirre' pt.  Please call pt.

## 2020-09-13 NOTE — Telephone Encounter (Signed)
Diflucan Prescription sent to pharmacy   

## 2020-09-13 NOTE — Telephone Encounter (Signed)
Patient aware.

## 2020-09-18 ENCOUNTER — Encounter: Payer: Self-pay | Admitting: Internal Medicine

## 2020-09-20 ENCOUNTER — Other Ambulatory Visit: Payer: Self-pay | Admitting: Family

## 2020-09-20 DIAGNOSIS — E785 Hyperlipidemia, unspecified: Secondary | ICD-10-CM

## 2020-10-04 ENCOUNTER — Other Ambulatory Visit: Payer: Self-pay | Admitting: Family

## 2020-10-04 DIAGNOSIS — I10 Essential (primary) hypertension: Secondary | ICD-10-CM

## 2020-10-04 MED ORDER — HYDROCHLOROTHIAZIDE 25 MG PO TABS: 25.0000 mg | ORAL_TABLET | Freq: Every day | ORAL | 0 refills | Status: DC

## 2020-10-04 NOTE — Addendum Note (Signed)
Addended by: Antonietta Barcelona D on: 10/04/2020 02:39 PM   Modules accepted: Orders

## 2020-10-04 NOTE — Telephone Encounter (Signed)
Pt called in and made an appt for the first available on 11/13/20. 30 day refill sent to pharmacy

## 2020-10-04 NOTE — Telephone Encounter (Signed)
[facility] NTBS 30 days given 09/10/20

## 2020-10-05 ENCOUNTER — Other Ambulatory Visit: Payer: Self-pay | Admitting: Family

## 2020-10-05 DIAGNOSIS — K219 Gastro-esophageal reflux disease without esophagitis: Secondary | ICD-10-CM

## 2020-10-05 DIAGNOSIS — R1013 Epigastric pain: Secondary | ICD-10-CM

## 2020-10-18 ENCOUNTER — Other Ambulatory Visit: Payer: Self-pay | Admitting: Family

## 2020-10-18 DIAGNOSIS — E785 Hyperlipidemia, unspecified: Secondary | ICD-10-CM

## 2020-11-01 ENCOUNTER — Other Ambulatory Visit: Payer: Self-pay | Admitting: Family

## 2020-11-01 DIAGNOSIS — I10 Essential (primary) hypertension: Secondary | ICD-10-CM

## 2020-11-07 ENCOUNTER — Encounter: Payer: Self-pay | Admitting: Internal Medicine

## 2020-11-07 ENCOUNTER — Ambulatory Visit (INDEPENDENT_AMBULATORY_CARE_PROVIDER_SITE_OTHER): Payer: Medicare HMO | Admitting: Internal Medicine

## 2020-11-07 VITALS — BP 140/72 | HR 77 | Ht 62.0 in | Wt 179.2 lb

## 2020-11-07 DIAGNOSIS — K5900 Constipation, unspecified: Secondary | ICD-10-CM | POA: Diagnosis not present

## 2020-11-07 DIAGNOSIS — Z86010 Personal history of colonic polyps: Secondary | ICD-10-CM | POA: Diagnosis not present

## 2020-11-07 DIAGNOSIS — R109 Unspecified abdominal pain: Secondary | ICD-10-CM | POA: Diagnosis not present

## 2020-11-07 DIAGNOSIS — K219 Gastro-esophageal reflux disease without esophagitis: Secondary | ICD-10-CM

## 2020-11-07 MED ORDER — SUTAB 1479-225-188 MG PO TABS: 1.0000 | ORAL_TABLET | Freq: Once | ORAL | 0 refills | Status: AC

## 2020-11-07 MED ORDER — ONDANSETRON HCL 4 MG PO TABS: ORAL_TABLET | ORAL | 0 refills | Status: DC

## 2020-11-07 NOTE — Progress Notes (Signed)
HISTORY OF PRESENT ILLNESS:  Charlotte Aguirre is a 75 y.o. female who is referred by her primary provider regarding chronic constipation with associated abdominal pain.  The patient previously obtained her GI care with [facility]: [facility].  However, she has chosen to switch to this office current care.  She does have a history of gastroesophageal reflux disease which she has been on Dexilant.  She tells me that she does have occasional breakthrough symptoms at night for which she takes antacids or drinks milk.  She denies dysphagia.  She feels like she did better on pantoprazole.  There was chronic intermittent nausea.  Next, she reports lifelong problems with constipation.  She tells me that she has undergone colonoscopy for several occasions in the past.  Most recently 2015.  She reports a history of polyps and was told to follow-up in 5 years.  She has noticed that her constipation has worsened over the past 3 to 4 months after being treated with antibiotics for a urinary tract infection.  She has tried different agents for short periods of time such as Colace, bisacodyl, MiraLAX.  These agents seem to work short-term but loses efficacy with time.  She tells me that she did try Linzess which resulted in diarrhea.  Dosage unknown.  She has a history of what sounds like rectocele.  She is on no new medications.  She was seen by her PCP August 31, 2020 (reviewed).  Review of blood work from April 2021 and March 2021 shows unremarkable comprehensive metabolic panel.  CBC with hemoglobin 13.8.  Plain films of the abdomen March 2021 revealed no acute abnormalities.  Mild colonic stool burden.  Abdominal ultrasound August 2019 unremarkable status post cholecystectomy.  She tells me that she had undergone prior upper endoscopy for her GERD.  Outside report shows a Schatzki's ring.  She has completed her Covid vaccination series and booster  REVIEW OF SYSTEMS:  All non-GI ROS negative unless otherwise stated in  the HPI except for arthritis, fibromyalgia  Past Medical History:  Diagnosis Date  . Arthritis    thumb and fingers  . Cataract   . Fibromyalgia   . GERD (gastroesophageal reflux disease)   . Hyperlipidemia   . Hypertension   . IBS (irritable bowel syndrome)   . Memory changes   . Osteopenia   . Peripheral neuropathy   . Ulcer    gastric  . Vitamin D insufficiency     Past Surgical History:  Procedure Laterality Date  . ABDOMINAL HYSTERECTOMY     uterus frist and 10 years later both ovaries removed  . CHOLECYSTECTOMY    . COLONOSCOPY N/A 03/22/2014   Procedure: COLONOSCOPY;  Surgeon: Malissa Hippo, MD;  Location: [facility];  Service: Endoscopy;  Laterality: N/A;  930  . EYE SURGERY Left 12/04/2017   Left eye cataract removal  . OOPHORECTOMY      Social History Serina Nichter Ullmer  reports that she quit smoking about 32 years ago. Her smoking use included cigarettes. She has a 45.00 pack-year smoking history. She has never used smokeless tobacco. She reports that she does not drink alcohol and does not use drugs.  family history includes COPD in her sister; Cancer in her brother and brother; Dementia in her brother; Diabetes in her sister; Glaucoma in her father; Heart attack in her brother, brother, brother, father, and sister; Heart failure in her brother, father, mother, and sister; Obesity in her sister; Osteoporosis in her sister; Thyroid disease in her  son.  Allergies  Allergen Reactions  . Aspirin     Must take EC, prior history of stomach ulcer. Other reaction(s): Angioedema (ALLERGY/intolerance) Must take EC, prior history of stomach ulcer.  . Bactrim [Sulfamethoxazole-Trimethoprim]   . Topiramate Other (See Comments)    hallucinations       PHYSICAL EXAMINATION: Vital signs: BP 140/72   Pulse 77   Ht 5\' 2"  (1.575 m)   Wt 179 lb 3.2 oz (81.3 kg)   SpO2 99%   BMI 32.78 kg/m   Constitutional: generally well-appearing, no acute distress Psychiatric: alert  and oriented x3, cooperative Eyes: extraocular movements intact, anicteric, conjunctiva pink Mouth: oral pharynx moist, no lesions Neck: supple no lymphadenopathy Cardiovascular: heart regular rate and rhythm, no murmur Lungs: clear to auscultation bilaterally Abdomen: soft, nontender, nondistended, no obvious ascites, no peritoneal signs, normal bowel sounds, no organomegaly Rectal: Deferred to colonoscopy Extremities: no clubbing, cyanosis, or lower extremity edema bilaterally Skin: no lesions on visible extremities Neuro: No focal deficits.  Cranial nerves intact  ASSESSMENT:  1.  Chronic constipation with associated abdominal discomfort.  Worsening 2.  History of colon polyps.  Last examination 2015.  Due for surveillance by history 3.  Chronic GERD.  History of Schatzki's ring.  No dysphagia.  On Dexilant.   PLAN:  1.  Schedule colonoscopy to evaluate worsening constipation and history of colon polyps.The nature of the procedure, as well as the risks, benefits, and alternatives were carefully and thoroughly reviewed with the patient. Ample time for discussion and questions allowed. The patient understood, was satisfied, and agreed to proceed. 2.  Obtain outside colonoscopy reports and pathology for review 3.  Samples of low-dose Linzess 72 mcg daily.  This may be a more effective dose than her previous dose (which is unknown).  If 72 mcg are ineffective then she may increase this as I have directed. 4.  An alternative strategy would be escalating doses of MiraLAX on a daily basis.  To be determined.

## 2020-11-07 NOTE — Patient Instructions (Signed)
You have been given samples of Linzess ; take one tablet 30 minutes before your first meal of the day.  You have been scheduled for a colonoscopy. Please follow written instructions given to you at your visit today.  Please pick up your prep supplies at the pharmacy within the next 1-3 days. If you use inhalers (even only as needed), please bring them with you on the day of your procedure.

## 2020-11-09 ENCOUNTER — Ambulatory Visit: Payer: Medicare HMO | Admitting: Family

## 2020-11-13 ENCOUNTER — Other Ambulatory Visit: Payer: Self-pay

## 2020-11-13 ENCOUNTER — Ambulatory Visit (INDEPENDENT_AMBULATORY_CARE_PROVIDER_SITE_OTHER): Payer: Medicare HMO | Admitting: Family

## 2020-11-13 ENCOUNTER — Encounter: Payer: Self-pay | Admitting: Family

## 2020-11-13 VITALS — BP 128/71 | HR 67 | Temp 97.3°F | Ht 62.0 in | Wt 181.2 lb

## 2020-11-13 DIAGNOSIS — R1013 Epigastric pain: Secondary | ICD-10-CM

## 2020-11-13 DIAGNOSIS — M1990 Unspecified osteoarthritis, unspecified site: Secondary | ICD-10-CM | POA: Diagnosis not present

## 2020-11-13 DIAGNOSIS — E559 Vitamin D deficiency, unspecified: Secondary | ICD-10-CM | POA: Diagnosis not present

## 2020-11-13 DIAGNOSIS — I10 Essential (primary) hypertension: Secondary | ICD-10-CM | POA: Diagnosis not present

## 2020-11-13 DIAGNOSIS — K219 Gastro-esophageal reflux disease without esophagitis: Secondary | ICD-10-CM

## 2020-11-13 DIAGNOSIS — E669 Obesity, unspecified: Secondary | ICD-10-CM | POA: Diagnosis not present

## 2020-11-13 DIAGNOSIS — E785 Hyperlipidemia, unspecified: Secondary | ICD-10-CM | POA: Diagnosis not present

## 2020-11-13 MED ORDER — PANTOPRAZOLE SODIUM 40 MG PO TBEC: 40.0000 mg | DELAYED_RELEASE_TABLET | Freq: Every day | ORAL | 3 refills | Status: DC

## 2020-11-13 MED ORDER — SIMVASTATIN 40 MG PO TABS: 40.0000 mg | ORAL_TABLET | Freq: Every day | ORAL | 3 refills | Status: DC

## 2020-11-13 MED ORDER — POTASSIUM CHLORIDE ER 10 MEQ PO TBCR: 10.0000 meq | EXTENDED_RELEASE_TABLET | Freq: Every day | ORAL | 3 refills | Status: DC

## 2020-11-13 MED ORDER — HYDROCHLOROTHIAZIDE 25 MG PO TABS: 25.0000 mg | ORAL_TABLET | Freq: Every day | ORAL | 1 refills | Status: DC

## 2020-11-13 NOTE — Progress Notes (Signed)
Subjective:    Patient ID: Charlotte Aguirre, female    DOB: May 29, 1946, 75 y.o.   MRN: [id number]  Chief Complaint  Patient presents with  . Medical Management of Chronic Issues   Pt presents to the office today for chronic follow up. She is followed by GI for GERD, IBS,  and abdominal pain. Has a colonoscopy scheduled for 11/22/20.   She is followed by Cardiologists for HTN and palpitations.  Hypertension This is a chronic problem. The current episode started more than 1 year ago. The problem has been resolved since onset. The problem is controlled. Associated symptoms include malaise/fatigue. Pertinent negatives include no peripheral edema or shortness of breath. Risk factors for coronary artery disease include dyslipidemia, sedentary lifestyle and obesity. The current treatment provides moderate improvement. There is no history of CVA or heart failure.  Hyperlipidemia This is a chronic problem. The current episode started more than 1 year ago. The problem is controlled. Exacerbating diseases include obesity. Pertinent negatives include no shortness of breath. Risk factors for coronary artery disease include dyslipidemia, hypertension, post-menopausal and a sedentary lifestyle.  Gastroesophageal Reflux She complains of belching and heartburn. She reports no coughing. This is a chronic problem. The current episode started more than 1 year ago. The problem occurs occasionally. The problem has been waxing and waning. She has tried a PPI for the symptoms. The treatment provided moderate relief.  Arthritis Presents for follow-up visit. She complains of pain and stiffness. The symptoms have been stable. Affected locations include the left knee, right knee, left MCP and right MCP. Her pain is at a severity of 3/10.  Constipation This is a chronic problem. The current episode started more than 1 year ago. The problem has been waxing and waning since onset. Treatments tried: linzess. The treatment provided  mild relief.      Review of Systems  Constitutional: Positive for malaise/fatigue.  Respiratory: Negative for cough and shortness of breath.   Gastrointestinal: Positive for constipation and heartburn.  Musculoskeletal: Positive for arthritis and stiffness.  All other systems reviewed and are negative.      Objective:   Physical Exam Vitals reviewed.  Constitutional:      General: She is not in acute distress.    Appearance: She is well-developed and well-nourished.  HENT:     Head: Normocephalic and atraumatic.     Right Ear: Tympanic membrane normal.     Left Ear: Tympanic membrane normal.     Mouth/Throat:     Mouth: Oropharynx is clear and moist.  Eyes:     Pupils: Pupils are equal, round, and reactive to light.  Neck:     Thyroid: No thyromegaly.  Cardiovascular:     Rate and Rhythm: Normal rate and regular rhythm.     Pulses: Intact distal pulses.     Heart sounds: Normal heart sounds. No murmur heard.   Pulmonary:     Effort: Pulmonary effort is normal. No respiratory distress.     Breath sounds: Normal breath sounds. No wheezing.  Abdominal:     General: Bowel sounds are normal. There is no distension.     Palpations: Abdomen is soft.     Tenderness: There is no abdominal tenderness.  Musculoskeletal:        General: No tenderness or edema. Normal range of motion.     Cervical back: Normal range of motion and neck supple.  Skin:    General: Skin is warm and dry.  Neurological:  Mental Status: She is alert and oriented to person, place, and time.     Cranial Nerves: No cranial nerve deficit.     Deep Tendon Reflexes: Reflexes are normal and symmetric.  Psychiatric:        Mood and Affect: Mood and affect normal.        Behavior: Behavior normal.        Thought Content: Thought content normal.        Judgment: Judgment normal.       BP 128/71   Pulse 67   Temp (!) 97.3 F (36.3 C) (Temporal)   Ht 5' 2"  (1.575 m)   Wt 181 lb 3.2 oz (82.2 kg)    BMI 33.14 kg/m      Assessment & Plan:  Charlotte Aguirre comes in today with chief complaint of Medical Management of Chronic Issues   Diagnosis and orders addressed:  1. Essential hypertension - hydrochlorothiazide (HYDRODIURIL) 25 MG tablet; Take 1 tablet (25 mg total) by mouth daily.  Dispense: 90 tablet; Refill: 1 - CMP14+EGFR - CBC with Differential/Platelet  2. Epigastric pain - CMP14+EGFR - CBC with Differential/Platelet  3. Gastroesophageal reflux disease without esophagitis - Will change dexilant to Protonix. Pt states this seemed to work better for her.  - CMP14+EGFR - CBC with Differential/Platelet - pantoprazole (PROTONIX) 40 MG tablet; Take 1 tablet (40 mg total) by mouth daily.  Dispense: 30 tablet; Refill: 3  4. Hyperlipidemia with target LDL less than 100  - simvastatin (ZOCOR) 40 MG tablet; Take 1 tablet (40 mg total) by mouth daily.  Dispense: 90 tablet; Refill: 3 - CMP14+EGFR - CBC with Differential/Platelet - Lipid panel  5. Primary hypertension - CMP14+EGFR - CBC with Differential/Platelet  6. Osteoarthritis, unspecified osteoarthritis type, unspecified site - CMP14+EGFR - CBC with Differential/Platelet  7. Obesity (BMI 30-39.9) - CMP14+EGFR - CBC with Differential/Platelet  8. Vitamin D deficiency - CMP14+EGFR - CBC with Differential/Platelet - VITAMIN D 25 Hydroxy (Vit-D Deficiency, Fractures)   Labs pending Health Maintenance reviewed Diet and exercise encouraged  Follow up plan: 6 months    Evelina Dun, FNP

## 2020-11-13 NOTE — Patient Instructions (Signed)
Food Choices for Gastroesophageal Reflux Disease, Adult When you have gastroesophageal reflux disease (GERD), the foods you eat and your eating habits are very important. Choosing the right foods can help ease your discomfort. Think about working with a food expert (dietitian) to help you make good choices. What are tips for following this plan? Reading food labels  Look for foods that are low in saturated fat. Foods that may help with your symptoms include: ? Foods that have less than 5% of daily value (DV) of fat. ? Foods that have 0 grams of trans fat. Cooking  Do not fry your food.  Cook your food by baking, steaming, grilling, or broiling. These are all methods that do not need a lot of fat for cooking.  To add flavor, try to use herbs that are low in spice and acidity. Meal planning  Choose healthy foods that are low in fat, such as: ? Fruits and vegetables. ? Whole grains. ? Low-fat dairy products. ? Lean meats, fish, and poultry.  Eat small meals often instead of eating 3 large meals each day. Eat your meals slowly in a place where you are relaxed. Avoid bending over or lying down until 2-3 hours after eating.  Limit high-fat foods such as fatty meats or fried foods.  Limit your intake of fatty foods, such as oils, butter, and shortening.  Avoid the following as told by your doctor: ? Foods that cause symptoms. These may be different for different people. Keep a food diary to keep track of foods that cause symptoms. ? Alcohol. ? Drinking a lot of liquid with meals. ? Eating meals during the 2-3 hours before bed.   Lifestyle  Stay at a healthy weight. Ask your doctor what weight is healthy for you. If you need to lose weight, work with your doctor to do so safely.  Exercise for at least 30 minutes on 5 or more days each week, or as told by your doctor.  Wear loose-fitting clothes.  Do not smoke or use any products that contain nicotine or tobacco. If you need help  quitting, ask your doctor.  Sleep with the head of your bed higher than your feet. Use a wedge under the mattress or blocks under the bed frame to raise the head of the bed.  Chew sugar-free gum after meals. What foods should eat? Eat a healthy, well-balanced diet of fruits, vegetables, whole grains, low-fat dairy products, lean meats, fish, and poultry. Each person is different. Foods that may cause symptoms in one person may not cause any symptoms in another person. Work with your doctor to find foods that are safe for you. The items listed above may not be a complete list of what you can eat and drink. Contact a food expert for more options.   What foods should I avoid? Limiting some of these foods may help in managing the symptoms of GERD. Everyone is different. Talk with a food expert or your doctor to help you find the exact foods to avoid, if any. Fruits Any fruits prepared with added fat. Any fruits that cause symptoms. For some people, this may include citrus fruits, such as oranges, grapefruit, pineapple, and lemons. Vegetables Deep-fried vegetables. French fries. Any vegetables prepared with added fat. Any vegetables that cause symptoms. For some people, this may include tomatoes and tomato products, chili peppers, onions and garlic, and horseradish. Grains Pastries or quick breads with added fat. Meats and other proteins High-fat meats, such as fatty beef or pork,   hot dogs, ribs, ham, sausage, salami, and bacon. Fried meat or protein, including fried fish and fried chicken. Nuts and nut butters, in large amounts. Dairy Whole milk and chocolate milk. Sour cream. Cream. Ice cream. Cream cheese. Milkshakes. Fats and oils Butter. Margarine. Shortening. Ghee. Beverages Coffee and tea, with or without caffeine. Carbonated beverages. Sodas. Energy drinks. Fruit juice made with acidic fruits, such as orange or grapefruit. Tomato juice. Alcoholic drinks. Sweets and desserts Chocolate and  cocoa. Donuts. Seasonings and condiments Pepper. Peppermint and spearmint. Added salt. Any condiments, herbs, or seasonings that cause symptoms. For some people, this may include curry, hot sauce, or vinegar-based salad dressings. The items listed above may not be a complete list of what you should not eat and drink. Contact a food expert for more options. Questions to ask your doctor Diet and lifestyle changes are often the first steps that are taken to manage symptoms of GERD. If diet and lifestyle changes do not help, talk with your doctor about taking medicines. Where to find more information  International Foundation for Gastrointestinal Disorders: aboutgerd.org Summary  When you have GERD, food and lifestyle choices are very important in easing your symptoms.  Eat small meals often instead of 3 large meals a day. Eat your meals slowly and in a place where you are relaxed.  Avoid bending over or lying down until 2-3 hours after eating.  Limit high-fat foods such as fatty meats or fried foods. This information is not intended to replace advice given to you by your health care provider. Make sure you discuss any questions you have with your health care provider. Document Revised: 04/30/2020 Document Reviewed: 04/30/2020 Elsevier Patient Education  2021 Elsevier Inc.  

## 2020-11-14 ENCOUNTER — Other Ambulatory Visit: Payer: Self-pay | Admitting: Family

## 2020-11-14 LAB — CMP14+EGFR
ALT: 8 IU/L (ref 0–32)
AST: 13 IU/L (ref 0–40)
Albumin/Globulin Ratio: 1.7 (ref 1.2–2.2)
Albumin: 3.9 g/dL (ref 3.7–4.7)
Alkaline Phosphatase: 121 IU/L (ref 44–121)
BUN/Creatinine Ratio: 15 (ref 12–28)
BUN: 12 mg/dL (ref 8–27)
Bilirubin Total: 0.3 mg/dL (ref 0.0–1.2)
CO2: 30 mmol/L — ABNORMAL HIGH (ref 20–29)
Calcium: 9.4 mg/dL (ref 8.7–10.3)
Chloride: 100 mmol/L (ref 96–106)
Creatinine, Ser: 0.8 mg/dL (ref 0.57–1.00)
GFR calc Af Amer: 83 mL/min/{1.73_m2} (ref >59)
GFR calc non Af Amer: 72 mL/min/{1.73_m2} (ref >59)
Globulin, Total: 2.3 g/dL (ref 1.5–4.5)
Glucose: 90 mg/dL (ref 65–99)
Potassium: 3.4 mmol/L — ABNORMAL LOW (ref 3.5–5.2)
Sodium: 143 mmol/L (ref 134–144)
Total Protein: 6.2 g/dL (ref 6.0–8.5)

## 2020-11-14 LAB — CBC WITH DIFFERENTIAL/PLATELET
Basophils Absolute: 0.1 10*3/uL (ref 0.0–0.2)
Basos: 1 %
EOS (ABSOLUTE): 0.2 10*3/uL (ref 0.0–0.4)
Eos: 2 %
Hematocrit: 43 % (ref 34.0–46.6)
Hemoglobin: 13.7 g/dL (ref 11.1–15.9)
Immature Grans (Abs): 0 10*3/uL (ref 0.0–0.1)
Immature Granulocytes: 0 %
Lymphocytes Absolute: 2.9 10*3/uL (ref 0.7–3.1)
Lymphs: 32 %
MCH: 26.8 pg (ref 26.6–33.0)
MCHC: 31.9 g/dL (ref 31.5–35.7)
MCV: 84 fL (ref 79–97)
Monocytes Absolute: 0.5 10*3/uL (ref 0.1–0.9)
Monocytes: 5 %
Neutrophils Absolute: 5.3 10*3/uL (ref 1.4–7.0)
Neutrophils: 60 %
Platelets: 262 10*3/uL (ref 150–450)
RBC: 5.12 x10E6/uL (ref 3.77–5.28)
RDW: 13.8 % (ref 11.7–15.4)
WBC: 9 10*3/uL (ref 3.4–10.8)

## 2020-11-14 LAB — LIPID PANEL
Chol/HDL Ratio: 3.9 ratio (ref 0.0–4.4)
Cholesterol, Total: 154 mg/dL (ref 100–199)
HDL: 39 mg/dL — ABNORMAL LOW (ref >39)
LDL Chol Calc (NIH): 83 mg/dL (ref 0–99)
Triglycerides: 187 mg/dL — ABNORMAL HIGH (ref 0–149)
VLDL Cholesterol Cal: 32 mg/dL (ref 5–40)

## 2020-11-14 LAB — VITAMIN D 25 HYDROXY (VIT D DEFICIENCY, FRACTURES): Vit D, 25-Hydroxy: 27.9 ng/mL — ABNORMAL LOW (ref 30.0–100.0)

## 2020-11-14 MED ORDER — VITAMIN D (ERGOCALCIFEROL) 1.25 MG (50000 UNIT) PO CAPS: 50000.0000 [IU] | ORAL_CAPSULE | ORAL | 3 refills | Status: DC

## 2020-11-14 MED ORDER — ROSUVASTATIN CALCIUM 10 MG PO TABS: 10.0000 mg | ORAL_TABLET | Freq: Every day | ORAL | 3 refills | Status: DC

## 2020-11-15 ENCOUNTER — Telehealth: Payer: Self-pay | Admitting: Internal Medicine

## 2020-11-15 NOTE — Telephone Encounter (Signed)
Inbound call from patient stating Linzess medication is helping and is wanting script be sent to [facility] in [place] please.

## 2020-11-16 MED ORDER — LINACLOTIDE 72 MCG PO CAPS: 72.0000 ug | ORAL_CAPSULE | Freq: Every day | ORAL | 3 refills | Status: DC

## 2020-11-22 ENCOUNTER — Encounter: Payer: Medicare HMO | Admitting: Internal Medicine

## 2020-11-22 ENCOUNTER — Telehealth: Payer: Self-pay

## 2020-11-22 NOTE — Telephone Encounter (Signed)
Spoke to [facility] - medication order was received on 11/14/2020 for Crestor and patient picked up. Called patient na nvm

## 2020-12-13 ENCOUNTER — Telehealth: Payer: Self-pay | Admitting: Internal Medicine

## 2020-12-13 NOTE — Telephone Encounter (Signed)
Hi Dr. Henrene Pastor, this patient just called to cancel procedure that was scheduled on 12/20/20 because she is not feeling well. She stated that "everybody in his family fell ill and it is too much for her, now." Patient will call back to reschedule. Thank you.

## 2020-12-20 ENCOUNTER — Encounter: Payer: Medicare HMO | Admitting: Internal Medicine

## 2021-01-07 ENCOUNTER — Telehealth: Payer: Self-pay | Admitting: Internal Medicine

## 2021-01-07 NOTE — Telephone Encounter (Signed)
Patient called and states she is having a lot of left lower abdominal pain and constipation seeking advise.

## 2021-01-07 NOTE — Telephone Encounter (Signed)
Pt reports she has had to cancel 2 colon appts due to sickness and a death in the family. She wants to reschedule. Pt scheduled for previsit 01/09/21 at 1pm, Colon scheduled in the [place] 01/21/21 at 4pm. Pt aware of appts.

## 2021-01-08 DIAGNOSIS — M545 Low back pain, unspecified: Secondary | ICD-10-CM | POA: Diagnosis not present

## 2021-01-09 ENCOUNTER — Other Ambulatory Visit: Payer: Self-pay

## 2021-01-09 ENCOUNTER — Ambulatory Visit (AMBULATORY_SURGERY_CENTER): Payer: Medicare HMO

## 2021-01-09 ENCOUNTER — Telehealth: Payer: Self-pay | Admitting: Internal Medicine

## 2021-01-09 VITALS — Ht 62.0 in | Wt 176.0 lb

## 2021-01-09 DIAGNOSIS — K5900 Constipation, unspecified: Secondary | ICD-10-CM

## 2021-01-09 DIAGNOSIS — R109 Unspecified abdominal pain: Secondary | ICD-10-CM

## 2021-01-09 DIAGNOSIS — K219 Gastro-esophageal reflux disease without esophagitis: Secondary | ICD-10-CM

## 2021-01-09 DIAGNOSIS — Z86010 Personal history of colonic polyps: Secondary | ICD-10-CM

## 2021-01-09 MED ORDER — NA SULFATE-K SULFATE-MG SULF 17.5-3.13-1.6 GM/177ML PO SOLN: 1.0000 | Freq: Once | ORAL | 0 refills | Status: AC

## 2021-01-09 NOTE — Progress Notes (Signed)
No egg or soy allergy known to patient  No issues with past sedation with any surgeries or procedures No intubation problems in the past  No FH of Malignant Hyperthermia No diet pills per patient No home 02 use per patient  No blood thinners per patient  Pt reports issues with constipation --- pt is currently on Linzess and it is working for her; No A fib or A flutter  COVID 19 guidelines implemented in [facility] today with Pt and RN  Pt is fully vaccinated for Covid x 2; Coupon given to pt in PV today, Code to Pharmacy and  NO PA's for preps discussed with pt in PV today  Discussed with pt there will be an out-of-pocket cost for prep and that varies from $0 to 70 dollars  Due to the COVID-19 pandemic we are asking patients to follow certain guidelines.  Pt aware of COVID protocols and [place] guidelines  Patient reports she is unable to swallow pills- is requesting liquid prep-suprep RX sent in

## 2021-01-09 NOTE — Telephone Encounter (Signed)
Returned patient call and updated her med list.

## 2021-01-09 NOTE — Telephone Encounter (Signed)
Inbound call from patient requesting a all back please to update her medication list.

## 2021-01-11 ENCOUNTER — Other Ambulatory Visit: Payer: Self-pay | Admitting: Family

## 2021-01-11 DIAGNOSIS — K219 Gastro-esophageal reflux disease without esophagitis: Secondary | ICD-10-CM

## 2021-01-16 DIAGNOSIS — N952 Postmenopausal atrophic vaginitis: Secondary | ICD-10-CM | POA: Diagnosis not present

## 2021-01-16 DIAGNOSIS — N816 Rectocele: Secondary | ICD-10-CM | POA: Diagnosis not present

## 2021-01-18 ENCOUNTER — Telehealth: Payer: Self-pay | Admitting: Internal Medicine

## 2021-01-18 NOTE — Telephone Encounter (Signed)
Left message on machine to call back  

## 2021-01-18 NOTE — Telephone Encounter (Signed)
Okay to proceed.  Thank her for inquiring

## 2021-01-18 NOTE — Telephone Encounter (Signed)
The pt has been advised and message left for her to proceed as planned with colonoscopy appt.

## 2021-01-18 NOTE — Telephone Encounter (Signed)
The pt states she was seen by her GYN on Wednesday and was told her Rectocele has returned. She was told the rectocele was protruding into the opening of her vagina.   She has a colonoscopy scheduled for Monday and wants to know if she is ok to proceed.  Please advise

## 2021-01-18 NOTE — Telephone Encounter (Signed)
See alternate result note dated 3/18.

## 2021-01-18 NOTE — Telephone Encounter (Signed)
Inbound call from patient returning nurse's call and is requesting for nurse to please leave voice mail with instructions on what to do if she is unable to answer.

## 2021-01-21 ENCOUNTER — Ambulatory Visit (AMBULATORY_SURGERY_CENTER): Payer: Medicare HMO | Admitting: Internal Medicine

## 2021-01-21 ENCOUNTER — Other Ambulatory Visit: Payer: Self-pay

## 2021-01-21 ENCOUNTER — Encounter: Payer: Self-pay | Admitting: Internal Medicine

## 2021-01-21 VITALS — BP 118/62 | HR 70 | Temp 98.6°F | Resp 12 | Ht 62.0 in | Wt 176.0 lb

## 2021-01-21 DIAGNOSIS — Z86010 Personal history of colonic polyps: Secondary | ICD-10-CM | POA: Diagnosis not present

## 2021-01-21 DIAGNOSIS — D123 Benign neoplasm of transverse colon: Secondary | ICD-10-CM

## 2021-01-21 DIAGNOSIS — D120 Benign neoplasm of cecum: Secondary | ICD-10-CM | POA: Diagnosis not present

## 2021-01-21 DIAGNOSIS — K635 Polyp of colon: Secondary | ICD-10-CM | POA: Diagnosis not present

## 2021-01-21 DIAGNOSIS — D122 Benign neoplasm of ascending colon: Secondary | ICD-10-CM | POA: Diagnosis not present

## 2021-01-21 DIAGNOSIS — Z1211 Encounter for screening for malignant neoplasm of colon: Secondary | ICD-10-CM | POA: Diagnosis not present

## 2021-01-21 MED ORDER — SODIUM CHLORIDE 0.9 % IV SOLN: 500.0000 mL | Freq: Once | INTRAVENOUS | Status: DC

## 2021-01-21 NOTE — Patient Instructions (Signed)
YOU HAD AN ENDOSCOPIC PROCEDURE TODAY AT THE [facility]:   Refer to the procedure report that was given to you for any specific questions about what was found during the examination.  If the procedure report does not answer your questions, please call your gastroenterologist to clarify.  If you requested that your care partner not be given the details of your procedure findings, then the procedure report has been included in a sealed envelope for you to review at your convenience later.  YOU SHOULD EXPECT: Some feelings of bloating in the abdomen. Passage of more gas than usual.  Walking can help get rid of the air that was put into your GI tract during the procedure and reduce the bloating. If you had a lower endoscopy (such as a colonoscopy or flexible sigmoidoscopy) you may notice spotting of blood in your stool or on the toilet paper. If you underwent a bowel prep for your procedure, you may not have a normal bowel movement for a few days.  Please Note:  You might notice some irritation and congestion in your nose or some drainage.  This is from the oxygen used during your procedure.  There is no need for concern and it should clear up in a day or so.  SYMPTOMS TO REPORT IMMEDIATELY:   Following lower endoscopy (colonoscopy or flexible sigmoidoscopy):  Excessive amounts of blood in the stool  Significant tenderness or worsening of abdominal pains  Swelling of the abdomen that is new, acute  Fever of 100F or higher   Following upper endoscopy (EGD)  Vomiting of blood or coffee ground material  New chest pain or pain under the shoulder blades  Painful or persistently difficult swallowing  New shortness of breath  Fever of 100F or higher  Black, tarry-looking stools  For urgent or emergent issues, a gastroenterologist can be reached at any hour by calling [phone]. Do not use MyChart messaging for urgent concerns.    DIET:  We do recommend a small meal at first, but  then you may proceed to your regular diet.  Drink plenty of fluids but you should avoid alcoholic beverages for 24 hours.  ACTIVITY:  You should plan to take it easy for the rest of today and you should NOT DRIVE or use heavy machinery until tomorrow (because of the sedation medicines used during the test).    FOLLOW UP: Our staff will call the number listed on your records 48-72 hours following your procedure to check on you and address any questions or concerns that you may have regarding the information given to you following your procedure. If we do not reach you, we will leave a message.  We will attempt to reach you two times.  During this call, we will ask if you have developed any symptoms of COVID 19. If you develop any symptoms (ie: fever, flu-like symptoms, shortness of breath, cough etc.) before then, please call [phone].  If you test positive for Covid 19 in the 2 weeks post procedure, please call and report this information to us.    If any biopsies were taken you will be contacted by phone or by letter within the next 1-3 weeks.  Please call us at [phone] if you have not heard about the biopsies in 3 weeks.    SIGNATURES/CONFIDENTIALITY: You and/or your care partner have signed paperwork which will be entered into your electronic medical record.  These signatures attest to the fact that that the information above on   your After Visit Summary has been reviewed and is understood.  Full responsibility of the confidentiality of this discharge information lies with you and/or your care-partner. 

## 2021-01-21 NOTE — Progress Notes (Signed)
Iza Preston CRNA relieves [employer]

## 2021-01-21 NOTE — Progress Notes (Signed)
A/ox3, pleased with MAC, report to RN 

## 2021-01-21 NOTE — Progress Notes (Signed)
Called to room to assist during endoscopic procedure.  Patient ID and intended procedure confirmed with present staff. Received instructions for my participation in the procedure from the performing physician.  

## 2021-01-21 NOTE — Op Note (Signed)
[name]: Charlotte Aguirre Procedure Date: 01/21/2021 3:55 PM MRN: [id number] Endoscopist: Docia Chuck. Henrene Pastor , MD Age: 75 Referring MD:  Date of Birth: 1946/10/03 Gender: Female Account #: [id number] Procedure:                Colonoscopy with cold snare polypectomy x 5 Indications:              High risk colon cancer surveillance: Personal                            history of non-advanced adenoma. Previous                            examinations elsewhere. Last examination 2015 Medicines:                Monitored Anesthesia Care Procedure:                Pre-Anesthesia Assessment:                           - Prior to the procedure, a History and Physical                            was performed, and patient medications and                            allergies were reviewed. The patient's tolerance of                            previous anesthesia was also reviewed. The risks                            and benefits of the procedure and the sedation                            options and risks were discussed with the patient.                            All questions were answered, and informed consent                            was obtained. Prior Anticoagulants: The patient has                            taken no previous anticoagulant or antiplatelet                            agents. ASA Grade Assessment: II - A patient with                            mild systemic disease. After reviewing the risks                            and benefits, the patient was deemed in  satisfactory condition to undergo the procedure.                           After obtaining informed consent, the colonoscope                            was passed under direct vision. Throughout the                            procedure, the patient's blood pressure, pulse, and                            oxygen saturations were monitored continuously. The                             Colonoscope was introduced through the anus and                            advanced to the the cecum, identified by                            appendiceal orifice and ileocecal valve. The                            ileocecal valve, appendiceal orifice, and rectum                            were photographed. The quality of the bowel                            preparation was excellent. The colonoscopy was                            performed without difficulty. The patient tolerated                            the procedure well. The bowel preparation used was                            SUPREP via split dose instruction. Scope In: 4:05:18 PM Scope Out: 4:24:03 PM Scope Withdrawal Time: 0 hours 16 minutes 1 second  Total Procedure Duration: 0 hours 18 minutes 45 seconds  Findings:                 Five polyps were found in the transverse colon,                            ascending colon and cecum. The polyps were 1 to 8                            mm in size. These polyps were removed with a cold                            snare. Resection and retrieval were  complete.                           Multiple small and large-mouthed diverticula were                            found in the sigmoid colon.                           The exam was otherwise without abnormality on                            direct and retroflexion views. Complications:            No immediate complications. Estimated blood loss:                            None. Estimated Blood Loss:     Estimated blood loss: none. Impression:               - Five 1 to 8 mm polyps in the transverse colon, in                            the ascending colon and in the cecum, removed with                            a cold snare. Resected and retrieved.                           - Diverticulosis in the sigmoid colon.                           - The examination was otherwise normal on direct                            and retroflexion  views. Recommendation:           - Repeat colonoscopy in 3 years for surveillance.                           - Patient has a contact number available for                            emergencies. The signs and symptoms of potential                            delayed complications were discussed with the                            patient. Return to normal activities tomorrow.                            Written discharge instructions were provided to the                            patient.                           -  Resume previous diet.                           - Continue present medications.                           - Await pathology results. Docia Chuck. Henrene Pastor, MD 01/21/2021 4:30:22 PM This report has been signed electronically.

## 2021-01-23 ENCOUNTER — Telehealth: Payer: Self-pay

## 2021-01-23 NOTE — Telephone Encounter (Signed)
  Follow up Call-  Call back number 01/21/2021  Post procedure Call Back phone  # [phone]  Permission to leave phone message Yes  Some recent data might be hidden     Patient questions:  Do you have a fever, pain , or abdominal swelling? No. Pain Score  0 *  Have you tolerated food without any problems? Yes.    Have you been able to return to your normal activities? Yes.    Do you have any questions about your discharge instructions: Diet   No. Medications  No. Follow up visit  No.  Do you have questions or concerns about your Care? No.  Actions: * If pain score is 4 or above: No action needed, pain <4.  1. Have you developed a fever since your procedure? no  2.   Have you had an respiratory symptoms (SOB or cough) since your procedure? no  3.   Have you tested positive for COVID 19 since your procedure no  4.   Have you had any family members/close contacts diagnosed with the COVID 19 since your procedure?  no   If yes to any of these questions please route to Joylene John, RN and Joella Prince, RN

## 2021-01-30 ENCOUNTER — Encounter: Payer: Self-pay | Admitting: Internal Medicine

## 2021-02-08 ENCOUNTER — Telehealth: Payer: Self-pay | Admitting: Internal Medicine

## 2021-02-08 NOTE — Telephone Encounter (Signed)
Charlotte Aguirre pt calling for colonoscopy results. Discussed path letter with pt and she is aware of results. Pt states that since Colonoscopy she had been having LLQ abd pain. At first it was coming and going but since Wednesday she reports it has been constant. She has not had a fever but yesterday she was nauseated and had bad diarrhea all day. Colonoscopy report mentions diverticula. Dr. Lyndel Safe as DOD please advise.

## 2021-02-08 NOTE — Telephone Encounter (Signed)
Patient calling for path results 

## 2021-02-12 NOTE — Telephone Encounter (Signed)
DOD Have talked to the patient Pain has completely resolved RG

## 2021-03-02 ENCOUNTER — Emergency Department (HOSPITAL_COMMUNITY): Payer: Medicare HMO

## 2021-03-02 ENCOUNTER — Encounter (HOSPITAL_COMMUNITY): Payer: Self-pay

## 2021-03-02 ENCOUNTER — Emergency Department (HOSPITAL_COMMUNITY)
Admission: EM | Admit: 2021-03-02 | Discharge: 2021-03-02 | Disposition: A | Discharge status: Home / Self Care | Payer: Medicare HMO | Attending: Emergency Medicine | Admitting: Emergency Medicine

## 2021-03-02 ENCOUNTER — Other Ambulatory Visit: Payer: Self-pay

## 2021-03-02 DIAGNOSIS — E876 Hypokalemia: Secondary | ICD-10-CM | POA: Insufficient documentation

## 2021-03-02 DIAGNOSIS — K3189 Other diseases of stomach and duodenum: Secondary | ICD-10-CM | POA: Diagnosis not present

## 2021-03-02 DIAGNOSIS — Z79899 Other long term (current) drug therapy: Secondary | ICD-10-CM | POA: Diagnosis not present

## 2021-03-02 DIAGNOSIS — K5792 Diverticulitis of intestine, part unspecified, without perforation or abscess without bleeding: Secondary | ICD-10-CM

## 2021-03-02 DIAGNOSIS — Z87891 Personal history of nicotine dependence: Secondary | ICD-10-CM | POA: Insufficient documentation

## 2021-03-02 DIAGNOSIS — N281 Cyst of kidney, acquired: Secondary | ICD-10-CM | POA: Diagnosis not present

## 2021-03-02 DIAGNOSIS — R1032 Left lower quadrant pain: Secondary | ICD-10-CM | POA: Diagnosis present

## 2021-03-02 DIAGNOSIS — K6389 Other specified diseases of intestine: Secondary | ICD-10-CM | POA: Diagnosis not present

## 2021-03-02 DIAGNOSIS — I10 Essential (primary) hypertension: Secondary | ICD-10-CM | POA: Insufficient documentation

## 2021-03-02 DIAGNOSIS — K5732 Diverticulitis of large intestine without perforation or abscess without bleeding: Secondary | ICD-10-CM | POA: Diagnosis not present

## 2021-03-02 LAB — COMPREHENSIVE METABOLIC PANEL
ALT: 12 U/L (ref 0–44)
AST: 18 U/L (ref 15–41)
Albumin: 3.7 g/dL (ref 3.5–5.0)
Alkaline Phosphatase: 103 U/L (ref 38–126)
Anion gap: 8 (ref 5–15)
BUN: 12 mg/dL (ref 8–23)
CO2: 32 mmol/L (ref 22–32)
Calcium: 8.9 mg/dL (ref 8.9–10.3)
Chloride: 98 mmol/L (ref 98–111)
Creatinine, Ser: 0.72 mg/dL (ref 0.44–1.00)
GFR, Estimated: >60 mL/min (ref >60)
Glucose, Bld: 106 mg/dL — ABNORMAL HIGH (ref 70–99)
Potassium: 3 mmol/L — ABNORMAL LOW (ref 3.5–5.1)
Sodium: 138 mmol/L (ref 135–145)
Total Bilirubin: 0.7 mg/dL (ref 0.3–1.2)
Total Protein: 7 g/dL (ref 6.5–8.1)

## 2021-03-02 LAB — URINALYSIS, ROUTINE W REFLEX MICROSCOPIC
Bilirubin Urine: NEGATIVE
Glucose, UA: NEGATIVE mg/dL
Hgb urine dipstick: NEGATIVE
Ketones, ur: NEGATIVE mg/dL
Leukocytes,Ua: NEGATIVE
Nitrite: NEGATIVE
Protein, ur: NEGATIVE mg/dL
Specific Gravity, Urine: 1.014 (ref 1.005–1.030)
pH: 6 (ref 5.0–8.0)

## 2021-03-02 LAB — CBC WITH DIFFERENTIAL/PLATELET
Abs Immature Granulocytes: 0.04 10*3/uL (ref 0.00–0.07)
Basophils Absolute: 0.1 10*3/uL (ref 0.0–0.1)
Basophils Relative: 1 %
Eosinophils Absolute: 0.1 10*3/uL (ref 0.0–0.5)
Eosinophils Relative: 1 %
HCT: 44.2 % (ref 36.0–46.0)
Hemoglobin: 14.2 g/dL (ref 12.0–15.0)
Immature Granulocytes: 0 %
Lymphocytes Relative: 17 %
Lymphs Abs: 2.3 10*3/uL (ref 0.7–4.0)
MCH: 27.2 pg (ref 26.0–34.0)
MCHC: 32.1 g/dL (ref 30.0–36.0)
MCV: 84.5 fL (ref 80.0–100.0)
Monocytes Absolute: 0.8 10*3/uL (ref 0.1–1.0)
Monocytes Relative: 6 %
Neutro Abs: 10.1 10*3/uL — ABNORMAL HIGH (ref 1.7–7.7)
Neutrophils Relative %: 75 %
Platelets: 288 10*3/uL (ref 150–400)
RBC: 5.23 MIL/uL — ABNORMAL HIGH (ref 3.87–5.11)
RDW: 13.6 % (ref 11.5–15.5)
WBC: 13.4 10*3/uL — ABNORMAL HIGH (ref 4.0–10.5)
nRBC: 0 % (ref 0.0–0.2)

## 2021-03-02 LAB — LIPASE, BLOOD: Lipase: 26 U/L (ref 11–51)

## 2021-03-02 MED ORDER — POTASSIUM CHLORIDE CRYS ER 20 MEQ PO TBCR: 40.0000 meq | EXTENDED_RELEASE_TABLET | Freq: Every day | ORAL | 0 refills | Status: DC

## 2021-03-02 MED ORDER — SODIUM CHLORIDE 0.9 % IV BOLUS
500.0000 mL | Freq: Once | INTRAVENOUS | Status: AC
  Administered 2021-03-02: 500 mL via INTRAVENOUS

## 2021-03-02 MED ORDER — METHOCARBAMOL 500 MG PO TABS: 500.0000 mg | ORAL_TABLET | Freq: Three times a day (TID) | ORAL | 0 refills | Status: DC | PRN

## 2021-03-02 MED ORDER — ONDANSETRON HCL 4 MG/2ML IJ SOLN
4.0000 mg | Freq: Once | INTRAMUSCULAR | Status: AC
  Administered 2021-03-02: 4 mg via INTRAVENOUS
  Filled 2021-03-02: qty 2

## 2021-03-02 MED ORDER — AMOXICILLIN-POT CLAVULANATE 875-125 MG PO TABS
1.0000 | ORAL_TABLET | Freq: Once | ORAL | Status: AC
  Administered 2021-03-02: 1 via ORAL
  Filled 2021-03-02: qty 1

## 2021-03-02 MED ORDER — POTASSIUM CHLORIDE CRYS ER 20 MEQ PO TBCR
40.0000 meq | EXTENDED_RELEASE_TABLET | Freq: Once | ORAL | Status: AC
  Administered 2021-03-02: 40 meq via ORAL
  Filled 2021-03-02: qty 2

## 2021-03-02 MED ORDER — IOHEXOL 300 MG/ML  SOLN
100.0000 mL | Freq: Once | INTRAMUSCULAR | Status: AC | PRN
  Administered 2021-03-02: 100 mL via INTRAVENOUS

## 2021-03-02 MED ORDER — AMOXICILLIN-POT CLAVULANATE 875-125 MG PO TABS: 1.0000 | ORAL_TABLET | Freq: Two times a day (BID) | ORAL | 0 refills | Status: AC

## 2021-03-02 NOTE — ED Provider Notes (Signed)
Emergency Department Provider Note   I have reviewed the triage vital signs and the nursing notes.   HISTORY  Chief Complaint Abdominal Pain   HPI Charlotte Aguirre is a 75 y.o. female with past medical history reviewed below including IBS, GERD, followed by Dr. Henrene Pastor with low-power GI presents to the emergency department with return of left lower quadrant abdominal pain.  She notes that symptoms initially began back in December.  She followed with her GI team and had a colonoscopy earlier this year.  She notes that several polyps were removed but continues to have intermittent pain in the left lower quadrant.  Symptoms of been somewhat waxing and waning but over the past 4 days have been more severe.  She denies any dysuria, hesitancy, urgency.  No fevers or chills.  She is having bowel movements which are green in color but denies black or bright red blood per rectum.  She has been compliant with her home medications. No radiation of symptoms or modifying factors. No recent abx.    Past Medical History:  Diagnosis Date  . Anxiety    situational anxiety/stress  . Arthritis    thumb and fingers  . Cataract    bilateral -LEFT sx completed  . Fibromyalgia   . GERD (gastroesophageal reflux disease)    on meds  . Hyperlipidemia    on meds  . Hypertension    on meds  . IBS (irritable bowel syndrome)   . Memory changes   . Osteopenia   . Peripheral neuropathy    bilateral feet  . Ulcer    gastric  . Vitamin D insufficiency     Patient Active Problem List   Diagnosis Date Noted  . Educated about COVID-19 virus infection 10/11/2019  . Hypokalemia 08/02/2019  . Obesity (BMI 30-39.9) 04/15/2016  . Metabolic syndrome 01/75/1025  . Slow transit constipation 07/27/2015  . Rectocele 10/31/2014  . Osteopenia 09/13/2014  . Prediabetes 06/23/2014  . Trigeminal neuralgia syndrome 02/02/2014  . Vitamin D deficiency 02/02/2014  . Memory impairment 12/12/2013  . GERD (gastroesophageal  reflux disease)   . Hypertension   . Irritable bowel syndrome 11/06/2009  . Hereditary and idiopathic peripheral neuropathy 02/28/2009  . Hyperlipidemia with target LDL less than 100 02/27/2009  . Allergic rhinitis 02/27/2009  . DIVERTICULOSIS, MILD 02/27/2009  . Osteoarthritis 02/27/2009  . Myalgia and myositis 02/27/2009  . PEPTIC ULCER DISEASE, HX OF 02/27/2009    Past Surgical History:  Procedure Laterality Date  . ABDOMINAL HYSTERECTOMY     uterus frist and 10 years later both ovaries removed  . APPENDECTOMY    . BLADDER SURGERY     bladder tack  . CATARACT EXTRACTION Left   . CHOLECYSTECTOMY    . COLONOSCOPY N/A 03/22/2014   Procedure: COLONOSCOPY;  Surgeon: Rogene Houston, MD;  Location: [facility];  Service: Endoscopy;  Laterality: N/A;  930  . OOPHORECTOMY    . TOE SURGERY Left    2nd toe on RIGHT foot w/pins  . WISDOM TOOTH EXTRACTION      Allergies Aspirin, Bactrim [sulfamethoxazole-trimethoprim], and Topiramate  Family History  Problem Relation Age of Onset  . Dementia Brother   . Heart attack Brother   . Heart failure Mother   . Heart failure Father   . Heart attack Father   . Glaucoma Father   . Heart failure Brother   . Heart attack Brother   . Heart failure Sister   . Heart attack Sister   .  Diabetes Sister   . Obesity Sister   . Cancer Brother        LYMPHOMA  . Heart attack Brother   . Stomach cancer Brother        lymphoma  . Cancer Brother        lymphoma-hip  . COPD Sister        from 2nd hand smoke - never a smoker  . Osteoporosis Sister   . Colon polyps Sister   . Thyroid disease Son   . Throat cancer Son   . Tongue cancer Son   . Colon cancer Neg Hx   . Esophageal cancer Neg Hx   . Rectal cancer Neg Hx     Social History Social History   Tobacco Use  . Smoking status: Former Smoker    Packs/day: 1.50    Years: 30.00    Pack years: 45.00    Types: Cigarettes    Quit date: 11/03/1988    Years since quitting: 32.3  .  Smokeless tobacco: Never Used  Vaping Use  . Vaping Use: Never used  Substance Use Topics  . Alcohol use: No  . Drug use: No    Review of Systems  Constitutional: No fever/chills Eyes: No visual changes. ENT: No sore throat. Cardiovascular: Denies chest pain. Respiratory: Denies shortness of breath. Gastrointestinal: Positive LLQ abdominal pain. Positive nausea, no vomiting.  Intermittent green diarrhea.  No constipation. Genitourinary: Negative for dysuria. Musculoskeletal: Negative for back pain. Skin: Negative for rash. Neurological: Negative for headaches, focal weakness or numbness.  10-point ROS otherwise negative.  ____________________________________________   PHYSICAL EXAM:  VITAL SIGNS: ED Triage Vitals  Enc Vitals Group     BP 03/02/21 1240 (!) 156/90     Pulse Rate 03/02/21 1240 87     Resp 03/02/21 1240 18     Temp 03/02/21 1240 98.8 F (37.1 C)     Temp Source 03/02/21 1240 Oral     SpO2 03/02/21 1240 97 %     Weight 03/02/21 1241 176 lb (79.8 kg)     Height 03/02/21 1241 5\' 2"  (1.575 m)   Constitutional: Alert and oriented. Well appearing and in no acute distress. Eyes: Conjunctivae are normal.  Head: Atraumatic. Nose: No congestion/rhinnorhea. Mouth/Throat: Mucous membranes are moist.  Neck: No stridor. Cardiovascular: Normal rate, regular rhythm. Good peripheral circulation. Grossly normal heart sounds.   Respiratory: Normal respiratory effort.  No retractions. Lungs CTAB. Gastrointestinal: Soft with focal LLQ tenderness with voluntary guarding. No distention.  Musculoskeletal: No gross deformities of extremities. Neurologic:  Normal speech and language. Skin:  Skin is warm, dry and intact. No rash noted.   ____________________________________________   LABS (all labs ordered are listed, but only abnormal results are displayed)  Labs Reviewed  COMPREHENSIVE METABOLIC PANEL - Abnormal; Notable for the following components:      Result  Value   Potassium 3.0 (*)    Glucose, Bld 106 (*)    All other components within normal limits  CBC WITH DIFFERENTIAL/PLATELET - Abnormal; Notable for the following components:   WBC 13.4 (*)    RBC 5.23 (*)    Neutro Abs 10.1 (*)    All other components within normal limits  URINALYSIS, ROUTINE W REFLEX MICROSCOPIC - Abnormal; Notable for the following components:   APPearance HAZY (*)    All other components within normal limits  URINE CULTURE  LIPASE, BLOOD   ____________________________________________  RADIOLOGY  CT ABDOMEN PELVIS W CONTRAST  Result Date: 03/02/2021 CLINICAL  DATA:  Left lower quadrant pain EXAM: CT ABDOMEN AND PELVIS WITH CONTRAST TECHNIQUE: Multidetector CT imaging of the abdomen and pelvis was performed using the standard protocol following bolus administration of intravenous contrast. CONTRAST:  110mL OMNIPAQUE IOHEXOL 300 MG/ML  SOLN COMPARISON:  July 2018 FINDINGS: Lower chest: No acute abnormality. Hepatobiliary: Cysts and too small to characterize low-attenuation lesions are again identified within the liver. Post cholecystectomy. No unexpected biliary dilatation. Pancreas: Unremarkable. Spleen: Unremarkable. Adrenals/Urinary Tract: Adrenals are unremarkable. Bilateral renal cysts and too small to characterize low-attenuation lesions. Bladder is poorly distended. Stomach/Bowel: Stomach is within normal limits. Bowel is normal in caliber. A duodenal diverticulum is present. There is distal colonic diverticulosis. Wall thickening and surrounding infiltration are present along the proximal sigmoid colon. Vascular/Lymphatic: Aortic atherosclerosis. No enlarged lymph nodes. Reproductive: Status post hysterectomy. No adnexal masses. Other: No ascites.  Abdominal wall is unremarkable. Musculoskeletal: No acute osseous abnormality. IMPRESSION: Acute sigmoid diverticulitis without evidence of complication. Electronically Signed   By: Macy Mis M.D.   On: 03/02/2021 17:16     ____________________________________________   PROCEDURES  Procedure(s) performed:   Procedures  None  ____________________________________________   INITIAL IMPRESSION / ASSESSMENT AND PLAN / ED COURSE  Pertinent labs & imaging results that were available during my care of the patient were reviewed by me and considered in my medical decision making (see chart for details).   Patient presents to the emergency department with intermittent left lower quadrant pain.  She has history of IBS and colonoscopy earlier this year with [facility] and Dr. Henrene Pastor.  She does have focal tenderness on exam with some voluntary guarding.  Given the location of pain considering diverticulitis with lower suspicion for perforation, urinary tract infection, or ureteral stone. Pain not consistent with vascular pathology such as dissection. Plan for labs, IVF, and CT abdomen/pelvis for further evaluation. Patient declines pain medication but would like some Zofran.   CT imaging reviewed looking consistent with acute diverticulitis without complication.  She has mild hypokalemia in the review of blood work.  No urinary tract infection.  On reassessment she is feeling well.  She does not want pain medication but would be open to a muscle relaxer which is helped her in the past.  She is feeling well and would like to try oral antibiotics at home.  She is not having vomiting.  We will give the first dose of Augmentin here.  She is established with a GI physician as above and will call them on Monday for follow-up along with her PCP.  Discussed supplementing her potassium over the next several days as well and all these medications were sent to the pharmacy.  We also discussed and provided in writing ED return precautions. ____________________________________________  FINAL CLINICAL IMPRESSION(S) / ED DIAGNOSES  Final diagnoses:  Diverticulitis  Hypokalemia     MEDICATIONS GIVEN DURING THIS  VISIT:  Medications  amoxicillin-clavulanate (AUGMENTIN) 875-125 MG per tablet 1 tablet (has no administration in time range)  potassium chloride SA (KLOR-CON) CR tablet 40 mEq (has no administration in time range)  sodium chloride 0.9 % bolus 500 mL (500 mLs Intravenous New Bag/Given 03/02/21 1602)  ondansetron (ZOFRAN) injection 4 mg (4 mg Intravenous Given 03/02/21 1602)  iohexol (OMNIPAQUE) 300 MG/ML solution 100 mL (100 mLs Intravenous Contrast Given 03/02/21 1658)     NEW OUTPATIENT MEDICATIONS STARTED DURING THIS VISIT:  New Prescriptions   AMOXICILLIN-CLAVULANATE (AUGMENTIN) 875-125 MG TABLET    Take 1 tablet by mouth every 12 (  twelve) hours for 10 days.   METHOCARBAMOL (ROBAXIN) 500 MG TABLET    Take 1 tablet (500 mg total) by mouth every 8 (eight) hours as needed for muscle spasms.   POTASSIUM CHLORIDE SA (KLOR-CON) 20 MEQ TABLET    Take 2 tablets (40 mEq total) by mouth daily for 5 days.    Note:  This document was prepared using Dragon voice recognition software and may include unintentional dictation errors.  Charlotte Quinton, MD, [facility]    Graziella Connery, [facility], MD 03/02/21 [phone]

## 2021-03-02 NOTE — ED Triage Notes (Signed)
Pt to er, pt states "that when my stool gets into a certain spot it hurts" pt states that she had a colonoscopy in February and was told that she had some polyps.  Pt states that she has some ibs and has constipation.  States that she had a bm today that was formed and liquidly and was green.

## 2021-03-02 NOTE — Discharge Instructions (Addendum)
You were seen in the emergency department today with pain in your left lower abdomen.  You were found to have diverticulitis which should resolve with antibiotics.  Please call your primary care doctor as well as her gastroenterologist to make them aware of your diagnosis.  You may require additional follow-up with your GI doctor once you are complete with your antibiotics.  Your potassium was also slightly low and I am starting you on 5 days of potassium supplements.  The Robaxin medication can cause some drowsiness and so take it only when he plan to rest.   If you develop worsening abdominal pain, fevers, chills, or other sudden severe symptoms please return to the emergency department immediately for reevaluation.

## 2021-03-05 LAB — URINE CULTURE

## 2021-03-11 ENCOUNTER — Encounter: Payer: Self-pay | Admitting: Family

## 2021-03-11 ENCOUNTER — Ambulatory Visit (INDEPENDENT_AMBULATORY_CARE_PROVIDER_SITE_OTHER): Payer: Medicare HMO | Admitting: Family

## 2021-03-11 ENCOUNTER — Other Ambulatory Visit: Payer: Self-pay

## 2021-03-11 VITALS — BP 134/65 | HR 67 | Temp 97.6°F | Ht 62.0 in | Wt 179.0 lb

## 2021-03-11 DIAGNOSIS — Z09 Encounter for follow-up examination after completed treatment for conditions other than malignant neoplasm: Secondary | ICD-10-CM

## 2021-03-11 DIAGNOSIS — K5792 Diverticulitis of intestine, part unspecified, without perforation or abscess without bleeding: Secondary | ICD-10-CM

## 2021-03-11 DIAGNOSIS — E876 Hypokalemia: Secondary | ICD-10-CM | POA: Diagnosis not present

## 2021-03-11 DIAGNOSIS — R350 Frequency of micturition: Secondary | ICD-10-CM | POA: Diagnosis not present

## 2021-03-11 LAB — URINALYSIS, COMPLETE
Bilirubin, UA: NEGATIVE
Glucose, UA: NEGATIVE
Ketones, UA: NEGATIVE
Leukocytes,UA: NEGATIVE
Microscopic Examination: NEGATIVE
Nitrite, UA: NEGATIVE
Protein,UA: NEGATIVE
Specific Gravity, UA: >1.03 — ABNORMAL HIGH (ref 1.005–1.030)
Urobilinogen, Ur: 0.2 mg/dL (ref 0.2–1.0)
pH, UA: 5.5 (ref 5.0–7.5)

## 2021-03-11 LAB — MICROSCOPIC EXAMINATION
RBC, Urine: NONE SEEN /hpf (ref 0–2)
WBC, UA: NONE SEEN /hpf (ref 0–5)

## 2021-03-11 MED ORDER — POTASSIUM CHLORIDE ER 10 MEQ PO TBCR: 10.0000 meq | EXTENDED_RELEASE_TABLET | Freq: Two times a day (BID) | ORAL | 1 refills | Status: DC

## 2021-03-11 NOTE — Progress Notes (Signed)
Subjective:    Patient ID: Charlotte Aguirre, female    DOB: 1946-03-13, 75 y.o.   MRN: [id number]  Chief Complaint  Patient presents with  . Hospitalization Follow-up  . Urinary Frequency   Pt presents to the office today for hospital follow up. She went to the ED on 03/02/21 with left lower abdominal pain. She was diagnosed with diverticulitis and given Augmentin. She was also found to be hypokalemia and started on potassium chloride.  Urinary Frequency  This is a new problem. The current episode started in the past 7 days. The problem occurs intermittently. The problem has been waxing and waning. The patient is experiencing no pain. Associated symptoms include frequency, nausea and urgency. Pertinent negatives include no vomiting. She has tried increased fluids for the symptoms. The treatment provided mild relief.  Abdominal Pain This is a new problem. The current episode started 1 to 4 weeks ago. The onset quality is gradual. The problem has been gradually improving. The pain is located in the LLQ. The pain is at a severity of 4/10. The pain is mild. The quality of the pain is cramping and sharp. The abdominal pain does not radiate. Associated symptoms include constipation, frequency and nausea. Pertinent negatives include no diarrhea, dysuria or vomiting. She has tried antibiotics for the symptoms. The treatment provided moderate relief.      Review of Systems  Gastrointestinal: Positive for abdominal pain, constipation and nausea. Negative for diarrhea and vomiting.  Genitourinary: Positive for frequency and urgency. Negative for dysuria.  All other systems reviewed and are negative.      Objective:   Physical Exam Vitals reviewed.  Constitutional:      General: She is not in acute distress.    Appearance: She is well-developed.  HENT:     Head: Normocephalic and atraumatic.  Eyes:     Pupils: Pupils are equal, round, and reactive to light.  Neck:     Thyroid: No thyromegaly.   Cardiovascular:     Rate and Rhythm: Normal rate and regular rhythm.     Heart sounds: Normal heart sounds. No murmur heard.   Pulmonary:     Effort: Pulmonary effort is normal. No respiratory distress.     Breath sounds: Normal breath sounds. No wheezing.  Abdominal:     General: Bowel sounds are normal. There is no distension.     Palpations: Abdomen is soft.     Tenderness: There is abdominal tenderness (LLQ).  Musculoskeletal:        General: No tenderness. Normal range of motion.     Cervical back: Normal range of motion and neck supple.  Skin:    General: Skin is warm and dry.  Neurological:     Mental Status: She is alert and oriented to person, place, and time.     Cranial Nerves: No cranial nerve deficit.     Deep Tendon Reflexes: Reflexes are normal and symmetric.  Psychiatric:        Behavior: Behavior normal.        Thought Content: Thought content normal.        Judgment: Judgment normal.          BP 134/65   Pulse 67   Temp 97.6 F (36.4 C) (Temporal)   Ht 5' 2"  (1.575 m)   Wt 179 lb (81.2 kg)   BMI 32.74 kg/m   Assessment & Plan:  Charlotte Aguirre comes in today with chief complaint of Hospitalization Follow-up and Urinary Frequency  Diagnosis and orders addressed:  1. Urine frequency - Urinalysis, Complete - Urine Culture - CBC with Differential/Platelet - CMP14+EGFR  2. Hospital discharge follow-up - CBC with Differential/Platelet - CMP14+EGFR  3. Hypokalemia - potassium chloride (KLOR-CON) 10 MEQ tablet; Take 1 tablet (10 mEq total) by mouth 2 (two) times daily.  Dispense: 180 tablet; Refill: 1 - CBC with Differential/Platelet - CMP14+EGFR  4. Diverticulitis - CBC with Differential/Platelet - CMP14+EGFR   Labs pending Health Maintenance reviewed Diet and exercise encouraged If pain continues after completion of antibiotics, need to call office and we will add another antibiotic.    Evelina Dun, FNP

## 2021-03-11 NOTE — Patient Instructions (Signed)
Diverticulitis  Diverticulitis is infection or inflammation of small pouches (diverticula) in the colon that form due to a condition called diverticulosis. Diverticula can trap stool (feces) and bacteria, causing infection and inflammation. Diverticulitis may cause severe stomach pain and diarrhea. It may lead to tissue damage in the colon that causes bleeding or blockage. The diverticula may also burst (rupture) and cause infected stool to enter other areas of the abdomen. What are the causes? This condition is caused by stool becoming trapped in the diverticula, which allows bacteria to grow in the diverticula. This leads to inflammation and infection. What increases the risk? You are more likely to develop this condition if you have diverticulosis. The risk increases if you:  Are overweight or obese.  Do not get enough exercise.  Drink alcohol.  Use tobacco products.  Eat a diet that has a lot of red meat such as beef, pork, or lamb.  Eat a diet that does not include enough fiber. High-fiber foods include fruits, vegetables, beans, nuts, and whole grains.  Are over 40 years of age. What are the signs or symptoms? Symptoms of this condition may include:  Pain and tenderness in the abdomen. The pain is normally located on the left side of the abdomen, but it may occur in other areas.  Fever and chills.  Nausea.  Vomiting.  Cramping.  Bloating.  Changes in bowel routines.  Blood in your stool. How is this diagnosed? This condition is diagnosed based on:  Your medical history.  A physical exam.  Tests to make sure there is nothing else causing your condition. These tests may include: ? Blood tests. ? Urine tests. ? CT scan of the abdomen. How is this treated? Most cases of this condition are mild and can be treated at home. Treatment may include:  Taking over-the-counter pain medicines.  Following a clear liquid diet.  Taking antibiotic medicines by  mouth.  Resting. More severe cases may need to be treated at a hospital. Treatment may include:  Not eating or drinking.  Taking prescription pain medicine.  Receiving antibiotic medicines through an IV.  Receiving fluids and nutrition through an IV.  Surgery. When your condition is under control, your health care provider may recommend that you have a colonoscopy. This is an exam to look at the entire large intestine. During the exam, a lubricated, bendable tube is inserted into the anus and then passed into the rectum, colon, and other parts of the large intestine. A colonoscopy can show how severe your diverticula are and whether something else may be causing your symptoms. Follow these instructions at home: Medicines  Take over-the-counter and prescription medicines only as told by your health care provider. These include fiber supplements, probiotics, and stool softeners.  If you were prescribed an antibiotic medicine, take it as told by your health care provider. Do not stop taking the antibiotic even if you start to feel better.  Ask your health care provider if the medicine prescribed to you requires you to avoid driving or using machinery. Eating and drinking  Follow a full liquid diet or another diet as directed by your health care provider.  After your symptoms improve, your health care provider may tell you to change your diet. He or she may recommend that you eat a diet that contains at least 25 grams (25 g) of fiber daily. Fiber makes it easier to pass stool. Healthy sources of fiber include: ? Berries. One cup contains 4-8 grams of fiber. ? Beans   or lentils. One-half cup contains 5-8 grams of fiber. ? Green vegetables. One cup contains 4 grams of fiber.  Avoid eating red meat.   General instructions  Do not use any products that contain nicotine or tobacco, such as cigarettes, e-cigarettes, and chewing tobacco. If you need help quitting, ask your health care  provider.  Exercise for at least 30 minutes, 3 times each week. You should exercise hard enough to raise your heart rate and break a sweat.  Keep all follow-up visits as told by your health care provider. This is important. You may need to have a colonoscopy. Contact a health care provider if:  Your pain does not improve.  Your bowel movements do not return to normal. Get help right away if:  Your pain gets worse.  Your symptoms do not get better with treatment.  Your symptoms suddenly get worse.  You have a fever.  You vomit more than one time.  You have stools that are bloody, black, or tarry. Summary  Diverticulitis is infection or inflammation of small pouches (diverticula) in the colon that form due to a condition called diverticulosis. Diverticula can trap stool (feces) and bacteria, causing infection and inflammation.  You are at higher risk for this condition if you have diverticulosis and you eat a diet that does not include enough fiber.  Most cases of this condition are mild and can be treated at home. More severe cases may need to be treated at a hospital.  When your condition is under control, your health care provider may recommend that you have an exam called a colonoscopy. This exam can show how severe your diverticula are and whether something else may be causing your symptoms.  Keep all follow-up visits as told by your health care provider. This is important. This information is not intended to replace advice given to you by your health care provider. Make sure you discuss any questions you have with your health care provider. Document Revised: 08/01/2019 Document Reviewed: 08/01/2019 Elsevier Patient Education  2021 [employer]  

## 2021-03-12 LAB — CBC WITH DIFFERENTIAL/PLATELET
Basophils Absolute: 0.1 10*3/uL (ref 0.0–0.2)
Basos: 1 %
EOS (ABSOLUTE): 0.2 10*3/uL (ref 0.0–0.4)
Eos: 2 %
Hematocrit: 44.9 % (ref 34.0–46.6)
Hemoglobin: 14.3 g/dL (ref 11.1–15.9)
Immature Grans (Abs): 0 10*3/uL (ref 0.0–0.1)
Immature Granulocytes: 0 %
Lymphocytes Absolute: 2.8 10*3/uL (ref 0.7–3.1)
Lymphs: 30 %
MCH: 26.8 pg (ref 26.6–33.0)
MCHC: 31.8 g/dL (ref 31.5–35.7)
MCV: 84 fL (ref 79–97)
Monocytes Absolute: 0.6 10*3/uL (ref 0.1–0.9)
Monocytes: 6 %
Neutrophils Absolute: 5.6 10*3/uL (ref 1.4–7.0)
Neutrophils: 61 %
Platelets: 323 10*3/uL (ref 150–450)
RBC: 5.34 x10E6/uL — ABNORMAL HIGH (ref 3.77–5.28)
RDW: 13.1 % (ref 11.7–15.4)
WBC: 9.2 10*3/uL (ref 3.4–10.8)

## 2021-03-12 LAB — CMP14+EGFR
ALT: 9 IU/L (ref 0–32)
AST: 14 IU/L (ref 0–40)
Albumin/Globulin Ratio: 1.9 (ref 1.2–2.2)
Albumin: 4.2 g/dL (ref 3.7–4.7)
Alkaline Phosphatase: 117 IU/L (ref 44–121)
BUN/Creatinine Ratio: 18 (ref 12–28)
BUN: 14 mg/dL (ref 8–27)
Bilirubin Total: <0.2 mg/dL (ref 0.0–1.2)
CO2: 29 mmol/L (ref 20–29)
Calcium: 9.6 mg/dL (ref 8.7–10.3)
Chloride: 100 mmol/L (ref 96–106)
Creatinine, Ser: 0.77 mg/dL (ref 0.57–1.00)
Globulin, Total: 2.2 g/dL (ref 1.5–4.5)
Glucose: 93 mg/dL (ref 65–99)
Potassium: 4.1 mmol/L (ref 3.5–5.2)
Sodium: 144 mmol/L (ref 134–144)
Total Protein: 6.4 g/dL (ref 6.0–8.5)
eGFR: 80 mL/min/{1.73_m2} (ref >59)

## 2021-03-13 LAB — URINE CULTURE: Organism ID, Bacteria: NO GROWTH

## 2021-03-14 ENCOUNTER — Telehealth: Payer: Self-pay | Admitting: Family Medicine

## 2021-03-14 ENCOUNTER — Telehealth: Payer: Self-pay

## 2021-03-14 MED ORDER — CIPROFLOXACIN HCL 500 MG PO TABS: 500.0000 mg | ORAL_TABLET | Freq: Two times a day (BID) | ORAL | 0 refills | Status: DC

## 2021-03-14 MED ORDER — METRONIDAZOLE 500 MG PO TABS: 500.0000 mg | ORAL_TABLET | Freq: Three times a day (TID) | ORAL | 0 refills | Status: DC

## 2021-03-14 NOTE — Telephone Encounter (Signed)
Cipro and Flagyl Prescription sent to pharmacy

## 2021-03-14 NOTE — Telephone Encounter (Signed)
  Incoming Patient Call  03/14/2021  What symptoms do you have? When Stool gets to the area where the diverticulitis is, it hurts. Was seen with Christy 5-9 and was told to let her know if she was having any pain to call  How long have you been sick? 2 weeks  Have you been seen for this problem? 5-9  If your provider decides to give you a prescription, which pharmacy would you like for it to be sent to? [facility] in [place]   Patient informed that this information will be sent to the clinical staff for review and that they should receive a follow up call.

## 2021-03-14 NOTE — Telephone Encounter (Signed)
Patient was woken from sleep this morning at 5:30 am with a sharp pain in her lower abdomen.  The pain has gone away but she reports that she has been extremely nauseous and her abdomen is very tender this morning.  She finished her antibiotic for diverticulitis last night.

## 2021-03-15 NOTE — Telephone Encounter (Signed)
Patient aware and verbalizes understanding.  Patient has a few questions   Why is she on 2 antibiotics ? She has never taken two before  Requesting something be sent in for yeast infection for abx's Per labs potassium was normal - does patient need to continue to take two pills a day or go back to one? If 2 please send in new rx

## 2021-03-18 ENCOUNTER — Telehealth: Payer: Self-pay

## 2021-03-18 MED ORDER — ONDANSETRON HCL 4 MG PO TABS: 4.0000 mg | ORAL_TABLET | Freq: Three times a day (TID) | ORAL | 0 refills | Status: DC | PRN

## 2021-03-18 MED ORDER — FLUCONAZOLE 150 MG PO TABS: 150.0000 mg | ORAL_TABLET | ORAL | 0 refills | Status: DC | PRN

## 2021-03-18 NOTE — Telephone Encounter (Signed)
Patient aware.

## 2021-03-18 NOTE — Telephone Encounter (Signed)
Diflucan Prescription sent to pharmacy and zofran to take 30 mins prior to your medication.

## 2021-03-18 NOTE — Telephone Encounter (Signed)
  Prescription Request  03/18/2021  What is the name of the medication or equipment? Yeast infection meds/ antibiotic is making her sick  Have you contacted your pharmacy to request a refill? (if applicable) no  [facility]   Patient notified that their request is being sent to the clinical staff for review and that they should receive a response within 2 business days.

## 2021-04-17 ENCOUNTER — Other Ambulatory Visit: Payer: Self-pay | Admitting: Family

## 2021-04-17 ENCOUNTER — Other Ambulatory Visit: Payer: Self-pay

## 2021-04-17 ENCOUNTER — Ambulatory Visit (INDEPENDENT_AMBULATORY_CARE_PROVIDER_SITE_OTHER): Payer: Medicare HMO | Admitting: Nurse Practitioner

## 2021-04-17 ENCOUNTER — Encounter: Payer: Self-pay | Admitting: Nurse Practitioner

## 2021-04-17 VITALS — BP 139/71 | HR 67 | Temp 97.0°F | Ht 62.0 in | Wt 178.0 lb

## 2021-04-17 DIAGNOSIS — Z23 Encounter for immunization: Secondary | ICD-10-CM | POA: Diagnosis not present

## 2021-04-17 DIAGNOSIS — G609 Hereditary and idiopathic neuropathy, unspecified: Secondary | ICD-10-CM

## 2021-04-17 DIAGNOSIS — R609 Edema, unspecified: Secondary | ICD-10-CM

## 2021-04-17 DIAGNOSIS — K219 Gastro-esophageal reflux disease without esophagitis: Secondary | ICD-10-CM

## 2021-04-17 MED ORDER — GABAPENTIN 300 MG PO CAPS: 300.0000 mg | ORAL_CAPSULE | Freq: Three times a day (TID) | ORAL | 3 refills | Status: DC

## 2021-04-17 NOTE — Assessment & Plan Note (Signed)
Bilateral lower extremity trace edema.  Patient reports symptoms have been present in the past few weeks.  Advised patient to elevate foot, compression socks, follow-up with worsening unresolved symptoms.

## 2021-04-17 NOTE — Progress Notes (Signed)
Acute Office Visit  Subjective:    Patient ID: Charlotte Aguirre, female    DOB: [birth year], 75 y.o.   MRN: [id number]  Chief Complaint  Patient presents with   Foot Swelling    Happens worse at night. Been going on for months. PT Has Dx of neuropathy.      HPI Patient is a 75 year old female who presents to clinic with bilateral lower extremity swelling.  Patient reports symptoms have been present for 2 weeks.  Symptoms are intermittent.  Associated with mild tenderness and sometimes moderate pain.  Patient has a history of fibromyalgia and neuropathy.  Patient has used nothing for symptom management.   Concerning patient's bilateral lower extremity peripheral neuropathy patient reports discontinuing gabapentin and since has had worsening symptoms.  Feet are sometimes cold, feelings of pins-and-needles, with mild numbness.   Past Medical History:  Diagnosis Date   Anxiety    situational anxiety/stress   Arthritis    thumb and fingers   Cataract    bilateral -LEFT sx completed   Fibromyalgia    GERD (gastroesophageal reflux disease)    on meds   Hyperlipidemia    on meds   Hypertension    on meds   IBS (irritable bowel syndrome)    Memory changes    Osteopenia    Peripheral neuropathy    bilateral feet   Ulcer    gastric   Vitamin D insufficiency     Past Surgical History:  Procedure Laterality Date   ABDOMINAL HYSTERECTOMY     uterus frist and 10 years later both ovaries removed   APPENDECTOMY     BLADDER SURGERY     bladder tack   CATARACT EXTRACTION Left    CHOLECYSTECTOMY     COLONOSCOPY N/A 03/22/2014   Procedure: COLONOSCOPY;  Surgeon: Rogene Houston, MD;  Location: [facility];  Service: Endoscopy;  Laterality: N/A;  930   OOPHORECTOMY     TOE SURGERY Left    2nd toe on RIGHT foot w/pins   WISDOM TOOTH EXTRACTION      Family History  Problem Relation Age of Onset   Dementia Brother    Heart attack Brother    Heart failure Mother    Heart failure  Father    Heart attack Father    Glaucoma Father    Heart failure Brother    Heart attack Brother    Heart failure Sister    Heart attack Sister    Diabetes Sister    Obesity Sister    Cancer Brother        LYMPHOMA   Heart attack Brother    Stomach cancer Brother        lymphoma   Cancer Brother        lymphoma-hip   COPD Sister        from 2nd hand smoke - never a smoker   Osteoporosis Sister    Colon polyps Sister    Thyroid disease Son    Throat cancer Son    Tongue cancer Son    Colon cancer Neg Hx    Esophageal cancer Neg Hx    Rectal cancer Neg Hx     Social History   Socioeconomic History   Marital status: Married    Spouse name: [name]   Number of children: 2   Years of education: 12   Highest education level: High school graduate  Occupational History   Occupation: Retired    Fish farm manager: Secondary school teacher  Tobacco Use   Smoking status: Former    Packs/day: 1.50    Years: 30.00    Pack years: 45.00    Types: Cigarettes    Quit date: 11/03/1988    Years since quitting: 32.4   Smokeless tobacco: Never  Vaping Use   Vaping Use: Never used  Substance and Sexual Activity   Alcohol use: No   Drug use: No   Sexual activity: Yes    Partners: Male  Other Topics Concern   Not on file  Social History Narrative   Retired. Lives with husband [name]. 2 sons, do not live locally. One small dog. She enjoys crocheting and quilting. Is involved in her church.   Social Determinants of Health   Financial Resource Strain: Not on file  Food Insecurity: Not on file  Transportation Needs: Not on file  Physical Activity: Not on file  Stress: Not on file  Social Connections: Not on file  Intimate Partner Violence: Not on file    Outpatient Medications Prior to Visit  Medication Sig Dispense Refill   hydrochlorothiazide (HYDRODIURIL) 25 MG tablet Take 1 tablet (25 mg total) by mouth daily. 90 tablet 1   linaclotide (LINZESS) 72 MCG capsule Take 1 capsule (72 mcg  total) by mouth daily before breakfast. 90 capsule 3   Multiple Vitamins-Minerals (PRESERVISION AREDS 2 PO) daily.     ondansetron (ZOFRAN) 4 MG tablet Take 1 tablet (4 mg total) by mouth every 8 (eight) hours as needed for nausea or vomiting. 20 tablet 0   potassium chloride (KLOR-CON) 10 MEQ tablet Take 1 tablet (10 mEq total) by mouth 2 (two) times daily. 180 tablet 1   simvastatin (ZOCOR) 40 MG tablet Take 40 mg by mouth daily.     methocarbamol (ROBAXIN) 500 MG tablet Take 1 tablet (500 mg total) by mouth every 8 (eight) hours as needed for muscle spasms. 20 tablet 0   pantoprazole (PROTONIX) 40 MG tablet TAKE 1 TABLET BY MOUTH EVERY DAY 90 tablet 0   Cholecalciferol (VITAMIN D-3 PO) Take 1 tablet by mouth daily. 2000 units     potassium chloride SA (KLOR-CON) 20 MEQ tablet Take 2 tablets (40 mEq total) by mouth daily for 5 days. 10 tablet 0   ciprofloxacin (CIPRO) 500 MG tablet Take 1 tablet (500 mg total) by mouth 2 (two) times daily. 14 tablet 0   fluconazole (DIFLUCAN) 150 MG tablet Take 1 tablet (150 mg total) by mouth every three (3) days as needed. 3 tablet 0   metroNIDAZOLE (FLAGYL) 500 MG tablet Take 1 tablet (500 mg total) by mouth 3 (three) times daily. 21 tablet 0   nitrofurantoin, macrocrystal-monohydrate, (MACROBID) 100 MG capsule Take 100 mg by mouth 2 (two) times daily.     ondansetron (ZOFRAN) 4 MG tablet Take as directed     Vitamin D, Ergocalciferol, (DRISDOL) 1.25 MG (50000 UNIT) CAPS capsule Take 1 capsule (50,000 Units total) by mouth every 7 (seven) days. 12 capsule 3   No facility-administered medications prior to visit.    Allergies  Allergen Reactions   Aspirin     Must take EC, prior history of stomach ulcer. Other reaction(s): Angioedema (ALLERGY/intolerance) Must take EC, prior history of stomach ulcer.   Bactrim [Sulfamethoxazole-Trimethoprim] Nausea And Vomiting   Topiramate Other (See Comments)    hallucinations    Review of Systems  Constitutional:  Negative.   HENT: Negative.    Respiratory: Negative.    Cardiovascular: Negative.   Gastrointestinal: Negative.   Genitourinary:  Negative.   Skin:  Negative for rash.  All other systems reviewed and are negative.     Objective:    Physical Exam Vitals and nursing note reviewed.  Constitutional:      Appearance: Normal appearance.  HENT:     Head: Normocephalic.     Nose: Nose normal.     Mouth/Throat:     Mouth: Mucous membranes are moist.     Pharynx: Oropharynx is clear.  Eyes:     Conjunctiva/sclera: Conjunctivae normal.  Cardiovascular:     Rate and Rhythm: Normal rate and regular rhythm.  Pulmonary:     Effort: Pulmonary effort is normal.     Breath sounds: Normal breath sounds.  Abdominal:     General: Bowel sounds are normal.  Musculoskeletal:     Right lower leg: Tenderness present.     Left lower leg: Swelling and tenderness present.     Right ankle: Swelling present. Tenderness present. Normal range of motion.     Left ankle: Swelling present. Tenderness present. Normal range of motion.     Comments: Trace swelling BLE  Skin:    Findings: No rash.  Neurological:     Mental Status: She is alert and oriented to person, place, and time.  Psychiatric:        Behavior: Behavior normal.    BP 139/71   Pulse 67   Temp (!) 97 F (36.1 C) (Temporal)   Ht $R'5\' 2"'RF$  (1.575 m)   Wt 178 lb (80.7 kg)   SpO2 99%   BMI 32.56 kg/m  Wt Readings from Last 3 Encounters:  04/17/21 178 lb (80.7 kg)  03/11/21 179 lb (81.2 kg)  03/02/21 176 lb (79.8 kg)    There are no preventive care reminders to display for this patient.  There are no preventive care reminders to display for this patient.   Lab Results  Component Value Date   TSH 3.420 07/14/2019   Lab Results  Component Value Date   WBC 9.2 03/11/2021   HGB 14.3 03/11/2021   HCT 44.9 03/11/2021   MCV 84 03/11/2021   PLT 323 03/11/2021   Lab Results  Component Value Date   NA 144 03/11/2021   K 4.1  03/11/2021   CO2 29 03/11/2021   GLUCOSE 93 03/11/2021   BUN 14 03/11/2021   CREATININE 0.77 03/11/2021   BILITOT <0.2 03/11/2021   ALKPHOS 117 03/11/2021   AST 14 03/11/2021   ALT 9 03/11/2021   PROT 6.4 03/11/2021   ALBUMIN 4.2 03/11/2021   CALCIUM 9.6 03/11/2021   ANIONGAP 8 03/02/2021   EGFR 80 03/11/2021   Lab Results  Component Value Date   CHOL 154 11/13/2020   Lab Results  Component Value Date   HDL 39 (L) 11/13/2020   Lab Results  Component Value Date   LDLCALC 83 11/13/2020   Lab Results  Component Value Date   TRIG 187 (H) 11/13/2020   Lab Results  Component Value Date   CHOLHDL 3.9 11/13/2020   Lab Results  Component Value Date   HGBA1C 5.8 11/26/2017       Assessment & Plan:   Problem List Items Addressed This Visit       Nervous and Auditory   Hereditary and idiopathic peripheral neuropathy - Primary    Started patient back on gabapentin 300 mg tablet by mouth.  Advised patient to take medication as prescribed.  Follow-up with worsening or unresolved symptoms.  Education provided with printed handouts given.  Rx sent to pharmacy.       Relevant Medications   gabapentin (NEURONTIN) 300 MG capsule     Other   Edema    Bilateral lower extremity trace edema.  Patient reports symptoms have been present in the past few weeks.  Advised patient to elevate foot, compression socks, follow-up with worsening unresolved symptoms.       Relevant Orders   For home use only DME Other see comment     Meds ordered this encounter  Medications   gabapentin (NEURONTIN) 300 MG capsule    Sig: Take 1 capsule (300 mg total) by mouth 3 (three) times daily.    Dispense:  90 capsule    Refill:  3    Order Specific Question:   Supervising Provider    Answer:   Janora Norlander [[id number]]     Ivy Lynn, NP

## 2021-04-17 NOTE — Assessment & Plan Note (Signed)
Started patient back on gabapentin 300 mg tablet by mouth.  Advised patient to take medication as prescribed.  Follow-up with worsening or unresolved symptoms.  Education provided with printed handouts given.  Rx sent to pharmacy.

## 2021-04-17 NOTE — Patient Instructions (Signed)
Neuropathic Pain Neuropathic pain is pain caused by damage to the nerves that are responsible for certain sensations in your body (sensory nerves). The pain can be caused by: Damage to the sensory nerves that send signals to your spinal cord and brain (peripheral nervous system). Damage to the sensory nerves in your brain or spinal cord (central nervous system). Neuropathic pain can make you more sensitive to pain. Even a minor sensation can feel very painful. This is usually a long-term condition that can be difficult to treat. The type of pain differs from person to person. It may: Start suddenly (acute), or it may develop slowly and last for a long time (chronic). Come and go as damaged nerves heal, or it may stay at the same level for years. Cause emotional distress, loss of sleep, and a lower quality of life. What are the causes? The most common cause of this condition is diabetes. Many other diseases and conditions can also cause neuropathic pain. Causes of neuropathic pain can be classified as: Toxic. This is caused by medicines and chemicals. The most common cause of toxic neuropathic pain is damage from cancer treatments (chemotherapy). Metabolic. This can be caused by: Diabetes. This is the most common disease that damages the nerves. Lack of vitamin B from long-term alcohol abuse. Traumatic. Any injury that cuts, crushes, or stretches a nerve can cause damage and pain. A common example is feeling pain after losing an arm or leg (phantom limb pain). Compression-related. If a sensory nerve gets trapped or compressed for a long period of time, the blood supply to the nerve can be cut off. Vascular. Many blood vessel diseases can cause neuropathic pain by decreasing blood supply and oxygen to nerves. Autoimmune. This type of pain results from diseases in which the body's defense system (immune system) mistakenly attacks sensory nerves. Examples of autoimmune diseases that can cause  neuropathic pain include lupus and multiple sclerosis. Infectious. Many types of viral infections can damage sensory nerves and cause pain. Shingles infection is a common cause of this type of pain. Inherited. Neuropathic pain can be a symptom of many diseases that are passed down through families (genetic). What increases the risk? You are more likely to develop this condition if: You have diabetes. You smoke. You drink too much alcohol. You are taking certain medicines, including medicines that kill cancer cells (chemotherapy) or that treat immune system disorders. What are the signs or symptoms? The main symptom is pain. Neuropathic pain is often described as: Burning. Shock-like. Stinging. Hot or cold. Itching. How is this diagnosed? No single test can diagnose neuropathic pain. It is diagnosed based on: Physical exam and your symptoms. Your health care provider will ask you about your pain. You may be asked to use a pain scale to describe how bad your pain is. Tests. These may be done to see if you have a high sensitivity to pain and to help find the cause and location of any sensory nerve damage. They include: Nerve conduction studies to test how well nerve signals travel through your sensory nerves (electrodiagnostic testing). Stimulating your sensory nerves through electrodes on your skin and measuring the response in your spinal cord and brain (somatosensory evoked potential). Imaging studies, such as: X-rays. CT scan. MRI. How is this treated? Treatment for neuropathic pain may change over time. You may need to try different treatment options or a combination of treatments. Some options include: Treating the underlying cause of the neuropathy, such as diabetes, kidney disease, or vitamin   deficiencies. Stopping medicines that can cause neuropathy, such as chemotherapy. Medicine to relieve pain. Medicines may include: Prescription or over-the-counter pain  medicine. Anti-seizure medicine. Antidepressant medicines. Pain-relieving patches that are applied to painful areas of skin. A medicine to numb the area (local anesthetic), which can be injected as a nerve block. Transcutaneous nerve stimulation. This uses electrical currents to block painful nerve signals. The treatment is painless. Alternative treatments, such as: Acupuncture. Meditation. Massage. Physical therapy. Pain management programs. Counseling. Follow these instructions at home: Medicines  Take over-the-counter and prescription medicines only as told by your health care provider. Do not drive or use heavy machinery while taking prescription pain medicine. If you are taking prescription pain medicine, take actions to prevent or treat constipation. Your health care provider may recommend that you: Drink enough fluid to keep your urine pale yellow. Eat foods that are high in fiber, such as fresh fruits and vegetables, whole grains, and beans. Limit foods that are high in fat and processed sugars, such as fried or sweet foods. Take an over-the-counter or prescription medicine for constipation.  Lifestyle  Have a good support system at home. Consider joining a chronic pain support group. Do not use any products that contain nicotine or tobacco, such as cigarettes and e-cigarettes. If you need help quitting, ask your health care provider. Do not drink alcohol.  General instructions Learn as much as you can about your condition. Work closely with all your health care providers to find the treatment plan that works best for you. Ask your health care provider what activities are safe for you. Keep all follow-up visits as told by your health care provider. This is important. Contact a health care provider if: Your pain treatments are not working. You are having side effects from your medicines. You are struggling with tiredness (fatigue), mood changes, depression, or  anxiety. Summary Neuropathic pain is pain caused by damage to the nerves that are responsible for certain sensations in your body (sensory nerves). Neuropathic pain may come and go as damaged nerves heal, or it may stay at the same level for years. Neuropathic pain is usually a long-term condition that can be difficult to treat. Consider joining a chronic pain support group. This information is not intended to replace advice given to you by your health care provider. Make sure you discuss any questions you have with your healthcare provider. Document Revised: 02/10/2019 Document Reviewed: 11/06/2017 Elsevier Patient Education  2022 [place]. Edema  Edema is when you have too much fluid in your body or under your skin. Edema may make your legs, feet, and ankles swell up. Swelling is also common in looser tissues, like around your eyes. This is a common condition. It gets more common as you get older. There are many possible causes of edema. Eating too much salt (sodium) and being on your feet or sitting for a long time can cause edema in yourlegs, feet, and ankles. Hot weather may make edema worse. Edema is usually painless. Your skin may look swollen or shiny. Follow these instructions at home: Keep the swollen body part raised (elevated) above the level of your heart when you are sitting or lying down. Do not sit still or stand for a long time. Do not wear tight clothes. Do not wear garters on your upper legs. Exercise your legs. This can help the swelling go down. Wear elastic bandages or support stockings as told by your doctor. Eat a low-salt (low-sodium) diet to reduce fluid as told  by your doctor. Depending on the cause of your swelling, you may need to limit how much fluid you drink (fluid restriction). Take over-the-counter and prescription medicines only as told by your doctor. Contact a doctor if: Treatment is not working. You have heart, liver, or kidney disease and have  symptoms of edema. You have sudden and unexplained weight gain. Get help right away if: You have shortness of breath or chest pain. You cannot breathe when you lie down. You have pain, redness, or warmth in the swollen areas. You have heart, liver, or kidney disease and get edema all of a sudden. You have a fever and your symptoms get worse all of a sudden. Summary Edema is when you have too much fluid in your body or under your skin. Edema may make your legs, feet, and ankles swell up. Swelling is also common in looser tissues, like around your eyes. Raise (elevate) the swollen body part above the level of your heart when you are sitting or lying down. Follow your doctor's instructions about diet and how much fluid you can drink (fluid restriction). This information is not intended to replace advice given to you by your health care provider. Make sure you discuss any questions you have with your healthcare provider. Document Revised: 08/15/2020 Document Reviewed: 08/15/2020 Elsevier Patient Education  2022 [employer].

## 2021-04-30 ENCOUNTER — Telehealth: Payer: Self-pay | Admitting: Family

## 2021-04-30 NOTE — Telephone Encounter (Signed)
Patient aware to call pharmacy and see who called in the Vit D rx.

## 2021-05-02 ENCOUNTER — Other Ambulatory Visit: Payer: Self-pay

## 2021-05-02 ENCOUNTER — Ambulatory Visit (INDEPENDENT_AMBULATORY_CARE_PROVIDER_SITE_OTHER): Payer: Medicare HMO | Admitting: *Deleted

## 2021-05-02 DIAGNOSIS — Z23 Encounter for immunization: Secondary | ICD-10-CM

## 2021-05-07 ENCOUNTER — Telehealth: Payer: Self-pay | Admitting: Family

## 2021-05-07 ENCOUNTER — Other Ambulatory Visit: Payer: Self-pay | Admitting: Family

## 2021-05-07 DIAGNOSIS — I10 Essential (primary) hypertension: Secondary | ICD-10-CM

## 2021-05-07 DIAGNOSIS — K219 Gastro-esophageal reflux disease without esophagitis: Secondary | ICD-10-CM

## 2021-05-07 NOTE — Telephone Encounter (Signed)
  Prescription Request  05/07/2021  What is the name of the medication or equipment? Hydrochlorothiazide 25 mg, Linaclotide 72 MCG, and Pantoprazole 40 mg. Patient has appt 7-21 with Alyse Low and needs a 30 day supply called in. They were refused.  Have you contacted your pharmacy to request a refill? (if applicable) YES  Which pharmacy would you like this sent to? [facility] in [place]   Patient notified that their request is being sent to the clinical staff for review and that they should receive a response within 2 business days.

## 2021-05-08 MED ORDER — LINACLOTIDE 72 MCG PO CAPS: 72.0000 ug | ORAL_CAPSULE | Freq: Every day | ORAL | 0 refills | Status: DC

## 2021-05-08 MED ORDER — HYDROCHLOROTHIAZIDE 25 MG PO TABS: 25.0000 mg | ORAL_TABLET | Freq: Every day | ORAL | 0 refills | Status: DC

## 2021-05-08 MED ORDER — PANTOPRAZOLE SODIUM 40 MG PO TBEC: 40.0000 mg | DELAYED_RELEASE_TABLET | Freq: Every day | ORAL | 0 refills | Status: DC

## 2021-05-08 NOTE — Telephone Encounter (Signed)
[place]

## 2021-05-10 DIAGNOSIS — J020 Streptococcal pharyngitis: Secondary | ICD-10-CM | POA: Diagnosis not present

## 2021-05-10 DIAGNOSIS — J029 Acute pharyngitis, unspecified: Secondary | ICD-10-CM | POA: Diagnosis not present

## 2021-05-10 DIAGNOSIS — R059 Cough, unspecified: Secondary | ICD-10-CM | POA: Diagnosis not present

## 2021-05-10 DIAGNOSIS — J329 Chronic sinusitis, unspecified: Secondary | ICD-10-CM | POA: Diagnosis not present

## 2021-05-13 ENCOUNTER — Ambulatory Visit (INDEPENDENT_AMBULATORY_CARE_PROVIDER_SITE_OTHER): Payer: Medicare HMO

## 2021-05-13 DIAGNOSIS — Z0000 Encounter for general adult medical examination without abnormal findings: Secondary | ICD-10-CM

## 2021-05-13 NOTE — Progress Notes (Signed)
MEDICARE ANNUAL WELLNESS VISIT  05/13/2021  Telephone Visit Disclaimer This Medicare AWV was conducted by telephone due to national recommendations for restrictions regarding the COVID-19 Pandemic (e.g. social distancing).  I verified, using two identifiers, that I am speaking with Charlotte Aguirre or their authorized healthcare agent. I discussed the limitations, risks, security, and privacy concerns of performing an evaluation and management service by telephone and the potential availability of an in-person appointment in the future. The patient expressed understanding and agreed to proceed.  Location of Patient: Home Location of Provider (nurse):  [facility]  Subjective:    Charlotte Aguirre is a 75 y.o. female patient of Hawks, Theador Hawthorne, FNP who had a Medicare Annual Wellness Visit today via telephone. Charlotte Aguirre is Retired and lives with her spouse. She has two children and three step-children, nine grandchildren and nine great grandchildren. She reports that she is socially active and does interact with friends/family regularly. She is minimally physically active and enjoys quilting and reading.  Patient Care Team: Sharion Balloon, FNP as PCP - General (Family Medicine) Rogene Houston, MD as Consulting Physician (Gastroenterology) Harlen Labs, MD as Referring Physician (Optometry)  Advanced Directives 05/13/2021 03/02/2021 01/23/2020 10/07/2018 07/24/2015 03/28/2015 05/10/2014  Does Patient Have a Medical Advance Directive? No No No No No No Patient would not like information  Would patient like information on creating a medical advance directive? No - Patient declined - No - Patient declined No - Patient declined - Yes - Scientist, clinical (histocompatibility and immunogenetics) given -  Pre-existing out of facility DNR order (yellow form or pink MOST form) - - - - - - -    Hospital Utilization Over the Past 12 Months: # of hospitalizations or ER visits: 1 # of surgeries: 0  Review of Systems    Patient reports that her overall  health is worse compared to last year.  History obtained from chart review and the patient  Patient Reported Readings (BP, Pulse, CBG, Weight, etc) none  Pain Assessment Pain : No/denies pain     Current Medications & Allergies (verified) Allergies as of 05/13/2021       Reactions   Aspirin    Must take EC, prior history of stomach ulcer. Other reaction(s): Angioedema (ALLERGY/intolerance) Must take EC, prior history of stomach ulcer.   Bactrim [sulfamethoxazole-trimethoprim] Nausea And Vomiting   Topiramate Other (See Comments)   hallucinations        Medication List        Accurate as of May 13, 2021  1:27 PM. If you have any questions, ask your nurse or doctor.          STOP taking these medications    potassium chloride SA 20 MEQ tablet Commonly known as: KLOR-CON       TAKE these medications    calcium carbonate 1250 (500 Ca) MG tablet Commonly known as: OS-CAL - dosed in mg of elemental calcium Take 1 tablet by mouth.   gabapentin 300 MG capsule Commonly known as: NEURONTIN Take 1 capsule (300 mg total) by mouth 3 (three) times daily.   hydrochlorothiazide 25 MG tablet Commonly known as: HYDRODIURIL Take 1 tablet (25 mg total) by mouth daily.   linaclotide 72 MCG capsule Commonly known as: Linzess Take 1 capsule (72 mcg total) by mouth daily before breakfast.   ondansetron 4 MG tablet Commonly known as: Zofran Take 1 tablet (4 mg total) by mouth every 8 (eight) hours as needed for nausea or vomiting.  pantoprazole 40 MG tablet Commonly known as: PROTONIX Take 1 tablet (40 mg total) by mouth daily.   potassium chloride 10 MEQ tablet Commonly known as: KLOR-CON Take 1 tablet (10 mEq total) by mouth 2 (two) times daily. What changed: when to take this   PRESERVISION AREDS 2 PO daily.   simvastatin 40 MG tablet Commonly known as: ZOCOR Take 40 mg by mouth daily.   VITAMIN D-3 PO Take 1 tablet by mouth daily. 2000 units         History (reviewed): Past Medical History:  Diagnosis Date   Anxiety    situational anxiety/stress   Arthritis    thumb and fingers   Cataract    bilateral -LEFT sx completed   Fibromyalgia    GERD (gastroesophageal reflux disease)    on meds   Hyperlipidemia    on meds   Hypertension    on meds   IBS (irritable bowel syndrome)    Memory changes    Osteopenia    Peripheral neuropathy    bilateral feet   Ulcer    gastric   Vitamin D insufficiency    Past Surgical History:  Procedure Laterality Date   ABDOMINAL HYSTERECTOMY     uterus frist and 10 years later both ovaries removed   APPENDECTOMY     BLADDER SURGERY     bladder tack   CATARACT EXTRACTION Left    CHOLECYSTECTOMY     COLONOSCOPY N/A 03/22/2014   Procedure: COLONOSCOPY;  Surgeon: Rogene Houston, MD;  Location: [facility];  Service: Endoscopy;  Laterality: N/A;  930   OOPHORECTOMY     TOE SURGERY Left    2nd toe on RIGHT foot w/pins   WISDOM TOOTH EXTRACTION     Family History  Problem Relation Age of Onset   Dementia Brother    Heart attack Brother    Heart failure Mother    Heart failure Father    Heart attack Father    Glaucoma Father    Heart failure Brother    Heart attack Brother    Heart failure Sister    Heart attack Sister    Diabetes Sister    Obesity Sister    Cancer Brother        LYMPHOMA   Heart attack Brother    Stomach cancer Brother        lymphoma   Cancer Brother        lymphoma-hip   COPD Sister        from 2nd hand smoke - never a smoker   Osteoporosis Sister    Colon polyps Sister    Thyroid disease Son    Throat cancer Son    Tongue cancer Son    Colon cancer Neg Hx    Esophageal cancer Neg Hx    Rectal cancer Neg Hx    Social History   Socioeconomic History   Marital status: Married    Spouse name: [name]   Number of children: 2   Years of education: 12   Highest education level: High school graduate  Occupational History   Occupation: Retired     Fish farm manager: [employer]  Tobacco Use   Smoking status: Former    Packs/day: 1.50    Years: 30.00    Pack years: 45.00    Types: Cigarettes    Quit date: 11/03/1988    Years since quitting: 32.5   Smokeless tobacco: Never  Vaping Use   Vaping Use: Never used  Substance and Sexual Activity   Alcohol use: No   Drug use: No   Sexual activity: Yes    Partners: Male  Other Topics Concern   Not on file  Social History Narrative   Retired. Lives with husband [name]. 2 sons, do not live locally. One small dog. She enjoys crocheting and quilting. Is involved in her church.   Social Determinants of Health   Financial Resource Strain: Not on file  Food Insecurity: Not on file  Transportation Needs: Not on file  Physical Activity: Not on file  Stress: Not on file  Social Connections: Not on file    Activities of Daily Living In your present state of health, do you have any difficulty performing the following activities: 05/13/2021  Hearing? N  Vision? Y  Difficulty concentrating or making decisions? N  Walking or climbing stairs? Y  Dressing or bathing? N  Doing errands, shopping? N  Preparing Food and eating ? N  Using the Toilet? N  In the past six months, have you accidently leaked urine? N  Do you have problems with loss of bowel control? N  Managing your Medications? N  Managing your Finances? N  Housekeeping or managing your Housekeeping? Y  Some recent data might be hidden   Patient has macular degeneration and a wrinkle in her retina so she does have some visual difficulties.  She experiences pain in her left knee when using stairs and tries to avoid them if possible.  Patient is able to do all of her housework except for sweeping, vacuuming and mopping.  These activities cause back pain, so she has someone who comes in to help her.  Patient Education/ Literacy How often do you need to have someone help you when you read instructions, pamphlets, or other written  materials from your doctor or pharmacy?: 1 - Never What is the last grade level you completed in school?: GED  Exercise Current Exercise Habits: The patient does not participate in regular exercise at present, Exercise limited by: orthopedic condition(s)  Diet Patient reports consuming 2 meals a day and 2 snack(s) a day Patient reports that her primary diet is: Regular Patient reports that she does have regular access to food.   Depression Screen PHQ 2/9 Scores 05/13/2021 11/13/2020 08/31/2020 08/22/2020 01/26/2020 01/23/2020 01/02/2020  PHQ - 2 Score 0 0 0 0 0 0 0  PHQ- 9 Score - - - 0 - - -     Fall Risk Fall Risk  05/13/2021 11/13/2020 08/31/2020 08/22/2020 01/26/2020  Falls in the past year? 0 0 0 0 0  Number falls in past yr: - - - - -  Injury with Fall? - - - - -  Comment - - - - -  Risk for fall due to : - - - - -  Risk for fall due to: Comment - - - - -  Follow up Falls evaluation completed - - - -  Comment - - - - -     Objective:  Charlotte Aguirre seemed alert and oriented and she participated appropriately during our telephone visit.  Blood Pressure Weight BMI  BP Readings from Last 3 Encounters:  04/17/21 139/71  03/11/21 134/65  03/02/21 123/75   Wt Readings from Last 3 Encounters:  04/17/21 178 lb (80.7 kg)  03/11/21 179 lb (81.2 kg)  03/02/21 176 lb (79.8 kg)   BMI Readings from Last 1 Encounters:  04/17/21 32.56 kg/m    *Unable to obtain current vital signs,  weight, and BMI due to telephone visit type  Hearing/Vision  Charlotte Aguirre did not seem to have difficulty with hearing/understanding during the telephone conversation Reports that she has had a formal eye exam by an eye care professional within the past year Reports that she has not had a formal hearing evaluation within the past year *Unable to fully assess hearing and vision during telephone visit type  Cognitive Function: 6CIT Screen 05/13/2021 01/23/2020  What Year? 0 points 0 points  What month? 0 points 0  points  What time? 0 points 0 points  Count back from 20 0 points 0 points  Months in reverse 0 points 0 points  Repeat phrase 0 points 0 points  Total Score 0 0   (Normal:0-7, Significant for Dysfunction: >8)  Normal Cognitive Function Screening: Yes   Immunization & Health Maintenance Record Immunization History  Administered Date(s) Administered   Fluad Quad(high Dose 65+) 07/08/2019   Influenza, High Dose Seasonal PF 07/24/2014, 08/07/2016, 08/11/2017, 08/31/2018, 07/08/2019   Influenza,inj,Quad PF,6+ Mos 08/21/2015   Influenza-Unspecified 07/23/2020   Moderna Sars-Covid-2 Vaccination 12/13/2019, 01/10/2020, 10/19/2020   Pneumococcal Conjugate-13 03/28/2015   Pneumococcal Polysaccharide-23 02/02/2014   Tdap 05/31/2020   Zoster Recombinat (Shingrix) 05/02/2021    Health Maintenance  Topic Date Due   COVID-19 Vaccine (4 - Booster for Moderna series) 01/17/2021   MAMMOGRAM  04/17/2022 (Originally 03/28/2021)   INFLUENZA VACCINE  06/03/2021   Zoster Vaccines- Shingrix (2 of 2) 06/27/2021   DEXA SCAN  07/19/2022   COLONOSCOPY (Pts 45-69yrs Insurance coverage will need to be confirmed)  01/22/2024   TETANUS/TDAP  05/31/2030   Hepatitis C Screening  Completed   PNA vac Low Risk Adult  Completed   HPV VACCINES  Aged Out       Assessment  This is a routine wellness examination for Charlotte Aguirre.  Health Maintenance: Due or Overdue Health Maintenance Due  Topic Date Due   COVID-19 Vaccine (4 - Booster for Moderna series) 01/17/2021    Charlotte Aguirre does not need a referral for Community Assistance: Care Management:   no Social Work:    no Prescription Assistance:  no Nutrition/Diabetes Education:  no   Plan:  Personalized Goals  Goals Addressed             This Visit's Progress    Patient Stated       05/13/2021 AWV Goal: Exercise for General Health  Patient will verbalize understanding of the benefits of increased physical activity: Exercising regularly is  important. It will improve your overall fitness, flexibility, and endurance. Regular exercise also will improve your overall health. It can help you control your weight, reduce stress, and improve your bone density. Over the next year, patient will increase physical activity as tolerated with a goal of at least 150 minutes of moderate physical activity per week.  You can tell that you are exercising at a moderate intensity if your heart starts beating faster and you start breathing faster but can still hold a conversation. Moderate-intensity exercise ideas include: Walking 1 mile (1.6 km) in about 15 minutes Biking Hiking Golfing Dancing Water aerobics Patient will verbalize understanding of everyday activities that increase physical activity by providing examples like the following: Yard work, such as: Sales promotion account executive Gardening Washing windows or floors Patient will be able to explain general safety guidelines for exercising:  Before you start a new exercise program, talk with your  health care provider. Do not exercise so much that you hurt yourself, feel dizzy, or get very short of breath. Wear comfortable clothes and wear shoes with good support. Drink plenty of water while you exercise to prevent dehydration or heat stroke. Work out until your breathing and your heartbeat get faster.         Personalized Health Maintenance & Screening Recommendations  Screening mammography  Lung Cancer Screening Recommended: no (Low Dose CT Chest recommended if Age 85-80 years, 30 pack-year currently smoking OR have quit w/in past 15 years) Hepatitis C Screening recommended: no HIV Screening recommended: no  Advanced Directives: Written information was not prepared per patient's request.  Referrals & Orders No orders of the defined types were placed in this encounter.   Follow-up Plan Follow-up with  Sharion Balloon, FNP as planned Schedule mammogram    I have personally reviewed and noted the following in the patient's chart:   Medical and social history Use of alcohol, tobacco or illicit drugs  Current medications and supplements Functional ability and status Nutritional status Physical activity Advanced directives List of other physicians Hospitalizations, surgeries, and ER visits in previous 12 months Vitals Screenings to include cognitive, depression, and falls Referrals and appointments  In addition, I have reviewed and discussed with Charlotte Aguirre certain preventive protocols, quality metrics, and best practice recommendations. A written personalized care plan for preventive services as well as general preventive health recommendations is available and can be mailed to the patient at her request.      Felicity Coyer, LPN    04/04/931

## 2021-05-23 ENCOUNTER — Other Ambulatory Visit: Payer: Self-pay

## 2021-05-23 ENCOUNTER — Ambulatory Visit (INDEPENDENT_AMBULATORY_CARE_PROVIDER_SITE_OTHER): Payer: Medicare HMO | Admitting: Family

## 2021-05-23 ENCOUNTER — Encounter: Payer: Self-pay | Admitting: Family

## 2021-05-23 VITALS — BP 128/82 | HR 68 | Temp 97.5°F | Ht 62.0 in | Wt 179.0 lb

## 2021-05-23 DIAGNOSIS — E669 Obesity, unspecified: Secondary | ICD-10-CM

## 2021-05-23 DIAGNOSIS — E785 Hyperlipidemia, unspecified: Secondary | ICD-10-CM

## 2021-05-23 DIAGNOSIS — K5901 Slow transit constipation: Secondary | ICD-10-CM | POA: Diagnosis not present

## 2021-05-23 DIAGNOSIS — G609 Hereditary and idiopathic neuropathy, unspecified: Secondary | ICD-10-CM

## 2021-05-23 DIAGNOSIS — K5792 Diverticulitis of intestine, part unspecified, without perforation or abscess without bleeding: Secondary | ICD-10-CM

## 2021-05-23 DIAGNOSIS — M1990 Unspecified osteoarthritis, unspecified site: Secondary | ICD-10-CM | POA: Diagnosis not present

## 2021-05-23 DIAGNOSIS — K581 Irritable bowel syndrome with constipation: Secondary | ICD-10-CM | POA: Diagnosis not present

## 2021-05-23 DIAGNOSIS — K5730 Diverticulosis of large intestine without perforation or abscess without bleeding: Secondary | ICD-10-CM

## 2021-05-23 DIAGNOSIS — K219 Gastro-esophageal reflux disease without esophagitis: Secondary | ICD-10-CM

## 2021-05-23 DIAGNOSIS — I10 Essential (primary) hypertension: Secondary | ICD-10-CM

## 2021-05-23 MED ORDER — CIPROFLOXACIN HCL 500 MG PO TABS: 500.0000 mg | ORAL_TABLET | Freq: Two times a day (BID) | ORAL | 0 refills | Status: DC

## 2021-05-23 MED ORDER — HYDROCHLOROTHIAZIDE 25 MG PO TABS: 25.0000 mg | ORAL_TABLET | Freq: Every day | ORAL | 2 refills | Status: DC

## 2021-05-23 MED ORDER — LINACLOTIDE 72 MCG PO CAPS: 72.0000 ug | ORAL_CAPSULE | Freq: Every day | ORAL | 2 refills | Status: DC

## 2021-05-23 MED ORDER — GABAPENTIN 300 MG PO CAPS: 300.0000 mg | ORAL_CAPSULE | Freq: Three times a day (TID) | ORAL | 3 refills | Status: DC

## 2021-05-23 MED ORDER — PANTOPRAZOLE SODIUM 40 MG PO TBEC: 40.0000 mg | DELAYED_RELEASE_TABLET | Freq: Every day | ORAL | 2 refills | Status: DC

## 2021-05-23 MED ORDER — SIMVASTATIN 40 MG PO TABS: 40.0000 mg | ORAL_TABLET | Freq: Every day | ORAL | 2 refills | Status: DC

## 2021-05-23 MED ORDER — METRONIDAZOLE 500 MG PO TABS: 500.0000 mg | ORAL_TABLET | Freq: Two times a day (BID) | ORAL | 0 refills | Status: DC

## 2021-05-23 NOTE — Progress Notes (Signed)
Subjective:    Patient ID: Charlotte Aguirre, female    DOB: 10/22/1946, 75 y.o.   MRN: [id number]  Chief Complaint  Patient presents with   Medical Management of Chronic Issues   Diverticulitis    Flare up needs something started Monday    Pt presents to the office today for chronic follow up. She is followed by GI for GERD, IBS,  diverticulosis, and abdominal pain. Has a colonoscopy scheduled for 11/22/20.   She is followed by Cardiologists for HTN and palpitations. Arthritis Presents for follow-up visit. She complains of pain and stiffness. Affected locations include the left knee, right knee, left MCP, right MCP, right wrist and left wrist. Her pain is at a severity of 0/10.  Hypertension This is a chronic problem. The current episode started more than 1 year ago. The problem has been resolved since onset. The problem is controlled. Associated symptoms include peripheral edema. Pertinent negatives include no malaise/fatigue or shortness of breath. Risk factors for coronary artery disease include dyslipidemia and obesity. The current treatment provides moderate improvement.  Gastroesophageal Reflux She complains of abdominal pain, belching and heartburn. This is a chronic problem. The current episode started more than 1 year ago. The problem occurs occasionally. Risk factors include obesity. She has tried a PPI for the symptoms. The treatment provided moderate relief.  Constipation This is a chronic problem. The current episode started more than 1 year ago. Her stool frequency is 4 to 5 times per week. Associated symptoms include abdominal pain.  Hyperlipidemia This is a chronic problem. The current episode started more than 1 year ago. The problem is controlled. Recent lipid tests were reviewed and are normal. Exacerbating diseases include obesity. Pertinent negatives include no shortness of breath. Current antihyperlipidemic treatment includes statins. The current treatment provides moderate  improvement of lipids. Risk factors for coronary artery disease include dyslipidemia, hypertension, a sedentary lifestyle and post-menopausal.  Abdominal Pain This is a recurrent problem. The current episode started 1 to 4 weeks ago. The pain is located in the LLQ. The pain is at a severity of 3/10. The pain is moderate. The quality of the pain is aching. Associated symptoms include belching and constipation. Her past medical history is significant for GERD.     Review of Systems  Constitutional:  Negative for malaise/fatigue.  Respiratory:  Negative for shortness of breath.   Gastrointestinal:  Positive for abdominal pain, constipation and heartburn.  Musculoskeletal:  Positive for arthritis and stiffness.  All other systems reviewed and are negative.     Objective:   Physical Exam Vitals reviewed.  Constitutional:      General: She is not in acute distress.    Appearance: She is well-developed. She is obese.  HENT:     Head: Normocephalic and atraumatic.     Right Ear: Tympanic membrane normal.     Left Ear: Tympanic membrane normal.  Eyes:     Pupils: Pupils are equal, round, and reactive to light.  Neck:     Thyroid: No thyromegaly.  Cardiovascular:     Rate and Rhythm: Normal rate and regular rhythm.     Heart sounds: Normal heart sounds. No murmur heard. Pulmonary:     Effort: Pulmonary effort is normal. No respiratory distress.     Breath sounds: Normal breath sounds. No wheezing.  Abdominal:     General: Bowel sounds are normal. There is no distension.     Palpations: Abdomen is soft.     Tenderness: There is  abdominal tenderness in the left lower quadrant.  Musculoskeletal:        General: No tenderness. Normal range of motion.     Cervical back: Normal range of motion and neck supple.  Skin:    General: Skin is warm and dry.  Neurological:     Mental Status: She is alert and oriented to person, place, and time.     Cranial Nerves: No cranial nerve deficit.      Deep Tendon Reflexes: Reflexes are normal and symmetric.  Psychiatric:        Behavior: Behavior normal.        Thought Content: Thought content normal.        Judgment: Judgment normal.         BP 128/82   Pulse 68   Temp (!) 97.5 F (36.4 C) (Temporal)   Ht 5\' 2"  (1.575 m)   Wt 179 lb (81.2 kg)   SpO2 95%   BMI 32.74 kg/m   Assessment & Plan:  [name] comes in today with chief complaint of Medical Management of Chronic Issues and Diverticulitis (Flare up needs something started Monday )   Diagnosis and orders addressed:  1. Primary hypertension - CBC with Differential/Platelet  2. Gastroesophageal reflux disease without esophagitis - pantoprazole (PROTONIX) 40 MG tablet; Take 1 tablet (40 mg total) by mouth daily.  Dispense: 90 tablet; Refill: 2 - CBC with Differential/Platelet  3. Osteoarthritis, unspecified osteoarthritis type, unspecified site - CBC with Differential/Platelet  4. Hyperlipidemia with target LDL less than 100 - simvastatin (ZOCOR) 40 MG tablet; Take 1 tablet (40 mg total) by mouth daily.  Dispense: 90 tablet; Refill: 2 - CBC with Differential/Platelet  5. Obesity (BMI 30-39.9) - CBC with Differential/Platelet  6. Diverticulosis of colon - CBC with Differential/Platelet  7. Irritable bowel syndrome with constipation - linaclotide (LINZESS) 72 MCG capsule; Take 1 capsule (72 mcg total) by mouth daily before breakfast.  Dispense: 90 capsule; Refill: 2 - CBC with Differential/Platelet  8. Slow transit constipation - CBC with Differential/Platelet  9. Essential hypertension - hydrochlorothiazide (HYDRODIURIL) 25 MG tablet; Take 1 tablet (25 mg total) by mouth daily.  Dispense: 90 tablet; Refill: 2 - CBC with Differential/Platelet  10. Hereditary and idiopathic peripheral neuropathy - gabapentin (NEURONTIN) 300 MG capsule; Take 1 capsule (300 mg total) by mouth 3 (three) times daily.  Dispense: 90 capsule; Refill: 3 - CBC with  Differential/Platelet  11. Diverticulitis Start cirpo and flagyl  Liquid diet Keep referral with GI - ciprofloxacin (CIPRO) 500 MG tablet; Take 1 tablet (500 mg total) by mouth 2 (two) times daily.  Dispense: 14 tablet; Refill: 0 - metroNIDAZOLE (FLAGYL) 500 MG tablet; Take 1 tablet (500 mg total) by mouth 2 (two) times daily.  Dispense: 14 tablet; Refill: 0 - CBC with Differential/Platelet   Labs pending Health Maintenance reviewed Diet and exercise encouraged  Follow up plan: 6 months   Evelina Dun, FNP

## 2021-05-23 NOTE — Patient Instructions (Signed)
Diverticulitis Diverticulitis is infection or inflammation of small pouches (diverticula) in the colon that form due to a condition called diverticulosis. Diverticula can trap stool (feces) and bacteria, causing infection and inflammation. Diverticulitis may cause severe stomach pain and diarrhea. It may lead to tissue damage in the colon that causes bleeding or blockage. The diverticula may also burst (rupture) and cause infected stool to enter other areas of the abdomen. What are the causes? This condition is caused by stool becoming trapped in the diverticula, which allows bacteria to grow in the diverticula. This leads to inflammation and infection. What increases the risk? You are more likely to develop this condition if you have diverticulosis. The risk increases if you: Are overweight or obese. Do not get enough exercise. Drink alcohol. Use tobacco products. Eat a diet that has a lot of red meat such as beef, pork, or lamb. Eat a diet that does not include enough fiber. High-fiber foods include fruits, vegetables, beans, nuts, and whole grains. Are over 40 years of age. What are the signs or symptoms? Symptoms of this condition may include: Pain and tenderness in the abdomen. The pain is normally located on the left side of the abdomen, but it may occur in other areas. Fever and chills. Nausea. Vomiting. Cramping. Bloating. Changes in bowel routines. Blood in your stool. How is this diagnosed? This condition is diagnosed based on: Your medical history. A physical exam. Tests to make sure there is nothing else causing your condition. These tests may include: Blood tests. Urine tests. CT scan of the abdomen. How is this treated? Most cases of this condition are mild and can be treated at home. Treatment may include: Taking over-the-counter pain medicines. Following a clear liquid diet. Taking antibiotic medicines by mouth. Resting. More severe cases may need to be treated  at a hospital. Treatment may include: Not eating or drinking. Taking prescription pain medicine. Receiving antibiotic medicines through an IV. Receiving fluids and nutrition through an IV. Surgery. When your condition is under control, your health care provider may recommend that you have a colonoscopy. This is an exam to look at the entire large intestine. During the exam, a lubricated, bendable tube is inserted into the anus and then passed into the rectum, colon, and other parts of the large intestine. A colonoscopy can show how severe your diverticula are and whether something else may be causing your symptoms. Follow these instructions at home: Medicines Take over-the-counter and prescription medicines only as told by your health care provider. These include fiber supplements, probiotics, and stool softeners. If you were prescribed an antibiotic medicine, take it as told by your health care provider. Do not stop taking the antibiotic even if you start to feel better. Ask your health care provider if the medicine prescribed to you requires you to avoid driving or using machinery. Eating and drinking  Follow a full liquid diet or another diet as directed by your health care provider. After your symptoms improve, your health care provider may tell you to change your diet. He or she may recommend that you eat a diet that contains at least 25 grams (25 g) of fiber daily. Fiber makes it easier to pass stool. Healthy sources of fiber include: Berries. One cup contains 4-8 grams of fiber. Beans or lentils. One-half cup contains 5-8 grams of fiber. Green vegetables. One cup contains 4 grams of fiber. Avoid eating red meat. General instructions Do not use any products that contain nicotine or tobacco, such as cigarettes,   e-cigarettes, and chewing tobacco. If you need help quitting, ask your health care provider. Exercise for at least 30 minutes, 3 times each week. You should exercise hard enough to  raise your heart rate and break a sweat. Keep all follow-up visits as told by your health care provider. This is important. You may need to have a colonoscopy. Contact a health care provider if: Your pain does not improve. Your bowel movements do not return to normal. Get help right away if: Your pain gets worse. Your symptoms do not get better with treatment. Your symptoms suddenly get worse. You have a fever. You vomit more than one time. You have stools that are bloody, black, or tarry. Summary Diverticulitis is infection or inflammation of small pouches (diverticula) in the colon that form due to a condition called diverticulosis. Diverticula can trap stool (feces) and bacteria, causing infection and inflammation. You are at higher risk for this condition if you have diverticulosis and you eat a diet that does not include enough fiber. Most cases of this condition are mild and can be treated at home. More severe cases may need to be treated at a hospital. When your condition is under control, your health care provider may recommend that you have an exam called a colonoscopy. This exam can show how severe your diverticula are and whether something else may be causing your symptoms. Keep all follow-up visits as told by your health care provider. This is important. This information is not intended to replace advice given to you by your health care provider. Make sure you discuss any questions you have with your health care provider. Document Revised: 08/01/2019 Document Reviewed: 08/01/2019 Elsevier Patient Education  2022 [employer]  

## 2021-06-07 ENCOUNTER — Encounter: Payer: Self-pay | Admitting: Family

## 2021-06-07 ENCOUNTER — Other Ambulatory Visit: Payer: Self-pay

## 2021-06-07 ENCOUNTER — Ambulatory Visit (INDEPENDENT_AMBULATORY_CARE_PROVIDER_SITE_OTHER): Payer: Medicare HMO | Admitting: Family

## 2021-06-07 ENCOUNTER — Ambulatory Visit (INDEPENDENT_AMBULATORY_CARE_PROVIDER_SITE_OTHER): Payer: Medicare HMO

## 2021-06-07 VITALS — BP 119/73 | HR 64 | Temp 98.9°F | Ht 62.0 in | Wt 179.0 lb

## 2021-06-07 DIAGNOSIS — E669 Obesity, unspecified: Secondary | ICD-10-CM

## 2021-06-07 DIAGNOSIS — M79632 Pain in left forearm: Secondary | ICD-10-CM

## 2021-06-07 DIAGNOSIS — M25532 Pain in left wrist: Secondary | ICD-10-CM

## 2021-06-07 DIAGNOSIS — M25562 Pain in left knee: Secondary | ICD-10-CM

## 2021-06-07 DIAGNOSIS — W19XXXA Unspecified fall, initial encounter: Secondary | ICD-10-CM | POA: Diagnosis not present

## 2021-06-07 DIAGNOSIS — S8002XA Contusion of left knee, initial encounter: Secondary | ICD-10-CM | POA: Diagnosis not present

## 2021-06-07 DIAGNOSIS — Y92009 Unspecified place in unspecified non-institutional (private) residence as the place of occurrence of the external cause: Secondary | ICD-10-CM | POA: Diagnosis not present

## 2021-06-07 NOTE — Patient Instructions (Signed)
Wrist Pain, Adult There are many things that can cause wrist pain. Some common causes include: An injury to the wrist area, such as a sprain, strain, or fracture. Overuse of the joint. A condition that causes increased pressure on a nerve in the wrist (carpal tunnel syndrome). Wear and tear of the joints that occurs with aging (osteoarthritis). Other types of joint inflammation and stiffness (arthritis). Sometimes, the cause of wrist pain is not known. Often, the pain goes away when you follow instructions from your health care provider for relieving pain at home, such as resting the wrist, icing the wrist, or using a splint or an elastic wrap for a short time. If your wrist pain continues, it is important totell your health care provider. Follow these instructions at home: If you have a splint or elastic wrap: Wear the splint or wrap as told by your health care provider. Remove it only as told by your health care provider. Ask your health care provider if you may remove it for bathing. Loosen the splint or wrap if your fingers tingle, become numb, or turn cold and blue. Check the skin around the splint or wrap every day. Tell your health care provider about any concerns. Keep the splint or wrap clean. If the splint or wrap is not waterproof: Do not let it get wet. Cover it with a watertight covering when you take a bath or shower. Managing pain, stiffness, and swelling  If directed, put ice on the painful area. To do this: If you have a removable splint or wrap, remove it as told by your health care provider. Put ice in a plastic bag. Place a towel between your skin and the bag or between your splint or wrap and the bag. Leave the ice on for 20 minutes, 2-3 times a day. Move your fingers often to reduce stiffness and swelling. Raise (elevate) the injured area above the level of your heart while you are sitting or lying down.  Activity Rest your affected wrist as told by your health care  provider. Return to your normal activities as told by your health care provider. Ask your health care provider what activities are safe for you. Ask your health care provider when it is safe to drive if you have a splint or wrap on your wrist. Do exercises as told by your health care provider. General instructions Pay attention to any changes in your symptoms. Take over-the-counter and prescription medicines only as told by your health care provider. Keep all follow-up visits as told by your health care provider. This is important. Contact a health care provider if: You have a sudden, sharp pain in the wrist, hand, or arm that is different or new. The swelling or bruising on your wrist or hand gets worse. Your skin becomes red, gets a rash, or has open sores. Your pain does not get better or it gets worse. You have a fever or chills. Get help right away if: You lose feeling in your fingers or hand. Your fingers turn white, very red, or cold and blue. You cannot move your fingers. Summary Wrist pain in an adult has many different causes. If your wrist pain continues, it is important to tell your health care provider. You may need to wear a splint or an elastic wrap for a short period of time. Return to your normal activities as told by your health care provider. Ask your health care provider what activities are safe for you. This information is not intended  to replace advice given to you by your health care provider. Make sure you discuss any questions you have with your healthcare provider. Document Revised: 09/08/2019 Document Reviewed: 09/08/2019 Elsevier Patient Education  2022 [employer].

## 2021-06-07 NOTE — Progress Notes (Signed)
   Subjective:    Patient ID: Charlotte Aguirre, female    DOB: 10-27-1946, 75 y.o.   MRN: [id number]  Chief Complaint  Patient presents with   Fall    Sunday night hurt left wrist xray today.    Fall The accident occurred 5 to 7 days ago. She landed on Hard floor. There was no blood loss. The point of impact was the left knee and left wrist. The pain is present in the left wrist. The pain is at a severity of 8/10. The pain is moderate. The symptoms are aggravated by flexion. Pertinent negatives include no fever, numbness or visual change. She has tried rest for the symptoms. The treatment provided mild relief.     Review of Systems  Constitutional:  Negative for fever.  Neurological:  Negative for numbness.  All other systems reviewed and are negative.     Objective:   Physical Exam Vitals reviewed.  Constitutional:      General: She is not in acute distress.    Appearance: She is well-developed. She is obese.  HENT:     Head: Normocephalic and atraumatic.     Right Ear: Tympanic membrane normal.     Left Ear: Tympanic membrane normal.  Eyes:     Pupils: Pupils are equal, round, and reactive to light.  Neck:     Thyroid: No thyromegaly.  Cardiovascular:     Rate and Rhythm: Normal rate and regular rhythm.     Heart sounds: Normal heart sounds. No murmur heard. Pulmonary:     Effort: Pulmonary effort is normal. No respiratory distress.     Breath sounds: Normal breath sounds. No wheezing.  Abdominal:     General: Bowel sounds are normal. There is no distension.     Palpations: Abdomen is soft.     Tenderness: There is no abdominal tenderness.  Musculoskeletal:        General: No tenderness. Normal range of motion.     Cervical back: Normal range of motion and neck supple.  Skin:    General: Skin is warm and dry.  Neurological:     Mental Status: She is alert and oriented to person, place, and time.     Cranial Nerves: No cranial nerve deficit.     Deep Tendon Reflexes:  Reflexes are normal and symmetric.  Psychiatric:        Behavior: Behavior normal.        Thought Content: Thought content normal.        Judgment: Judgment normal.    Negative x-ray, Preliminary reading by Evelina Dun, FNP [facility]  BP 119/73   Pulse 64   Temp 98.9 F (37.2 C) (Temporal)   Ht '5\' 2"'$  (1.575 m)   Wt 179 lb (81.2 kg)   BMI 32.74 kg/m       Assessment & Plan:  Charlotte Aguirre comes in today with chief complaint of Fall (Sunday night hurt left wrist xray today.)   Diagnosis and orders addressed:  1. Obesity (BMI 30-39.9) - DG Wrist Complete Left; Future  2. Fall in home, initial encounter - DG Wrist Complete Left; Future  3. Left wrist pain  4. Acute pain of left knee  5. Hematoma of left knee region   Rest Ice Elevation  Fall precautions discussed  RTO if symptoms worsen or do not improve    Evelina Dun, FNP

## 2021-06-17 ENCOUNTER — Telehealth: Payer: Self-pay | Admitting: Family

## 2021-06-17 NOTE — Telephone Encounter (Signed)
Pt called to get her xray results. Reviewed results with patient per Christys notes. Pt voiced understanding.

## 2021-07-04 ENCOUNTER — Encounter: Payer: Self-pay | Admitting: Family

## 2021-07-04 ENCOUNTER — Other Ambulatory Visit: Payer: Self-pay

## 2021-07-04 ENCOUNTER — Ambulatory Visit (INDEPENDENT_AMBULATORY_CARE_PROVIDER_SITE_OTHER): Payer: Medicare HMO | Admitting: Family

## 2021-07-04 VITALS — BP 136/74 | HR 73 | Temp 97.0°F | Ht 62.0 in | Wt 181.6 lb

## 2021-07-04 DIAGNOSIS — M5441 Lumbago with sciatica, right side: Secondary | ICD-10-CM

## 2021-07-04 MED ORDER — PREDNISONE 10 MG (21) PO TBPK: ORAL_TABLET | ORAL | 0 refills | Status: DC

## 2021-07-04 NOTE — Progress Notes (Signed)
   Subjective:    Patient ID: Charlotte Aguirre, female    DOB: May 21, 1946, 75 y.o.   MRN: [id number]  Chief Complaint  Patient presents with   Sciatica   Pt presents to the office today sciatica pain in right buttocks and radiating down.  Back Pain This is a new problem. The current episode started 1 to 4 weeks ago. The problem occurs constantly. The problem has been waxing and waning since onset. The pain is present in the gluteal. The quality of the pain is described as aching. The pain radiates to the right thigh. The pain is at a severity of 3/10. The pain is moderate. The symptoms are aggravated by sitting. Associated symptoms include leg pain and tingling. Treatments tried: tyelnol, icy hot. The treatment provided mild relief.     Review of Systems  Musculoskeletal:  Positive for back pain.  Neurological:  Positive for tingling.  All other systems reviewed and are negative.     Objective:   Physical Exam Vitals reviewed.  Constitutional:      General: She is not in acute distress.    Appearance: She is well-developed. She is obese.  HENT:     Head: Normocephalic and atraumatic.  Eyes:     Pupils: Pupils are equal, round, and reactive to light.  Neck:     Thyroid: No thyromegaly.  Cardiovascular:     Rate and Rhythm: Normal rate and regular rhythm.     Heart sounds: Normal heart sounds. No murmur heard. Pulmonary:     Effort: Pulmonary effort is normal. No respiratory distress.     Breath sounds: Normal breath sounds. No wheezing.  Abdominal:     General: Bowel sounds are normal. There is no distension.     Palpations: Abdomen is soft.     Tenderness: There is no abdominal tenderness.  Musculoskeletal:        General: No tenderness.     Cervical back: Normal range of motion and neck supple.     Comments: Pain in right buttocks, negative SLR  Skin:    General: Skin is warm and dry.  Neurological:     Mental Status: She is alert and oriented to person, place, and time.      Cranial Nerves: No cranial nerve deficit.     Deep Tendon Reflexes: Reflexes are normal and symmetric.  Psychiatric:        Behavior: Behavior normal.        Thought Content: Thought content normal.        Judgment: Judgment normal.      BP 136/74   Pulse 73   Temp (!) 97 F (36.1 C) (Temporal)   Ht '5\' 2"'$  (1.575 m)   Wt 181 lb 9.6 oz (82.4 kg)   BMI 33.22 kg/m      Assessment & Plan:  Charlotte Aguirre comes in today with chief complaint of Sciatica   Diagnosis and orders addressed:  1. Acute right-sided low back pain with right-sided sciatica Rest ROM exercises PT can not tolerate NSAID's RTO if symptoms worsen or do not improve  - predniSONE (STERAPRED UNI-PAK 21 TAB) 10 MG (21) TBPK tablet; Use as directed  Dispense: 21 tablet; Refill: 0   Evelina Dun, FNP

## 2021-07-04 NOTE — Patient Instructions (Signed)
Sciatica Sciatica is pain, numbness, weakness, or tingling along the path of the sciatic nerve. The sciatic nerve starts in the lower back and runs down the back of each leg. The nerve controls the muscles in the lower leg and in the back of the knee. It also provides feeling (sensation) to the back of the thigh, the lower leg, and the sole of the foot. Sciatica is a symptom of another medical condition that pinches or puts pressure on the sciatic nerve. Sciatica most often only affects one side of the body. Sciatica usually goes away on its own or with treatment. In some cases, sciatica may come back (recur). What are the causes? This condition is caused by pressure on the sciatic nerve or pinching of the nerve. This may be the result of: A disk in between the bones of the spine bulging out too far (herniated disk). Age-related changes in the spinal disks. A pain disorder that affects a muscle in the buttock. Extra bone growth near the sciatic nerve. A break (fracture) of the pelvis. Pregnancy. Tumor. This is rare. What increases the risk? The following factors may make you more likely to develop this condition: Playing sports that place pressure or stress on the spine. Having poor strength and flexibility. A history of back injury or surgery. Sitting for long periods of time. Doing activities that involve repetitive bending or lifting. Obesity. What are the signs or symptoms? Symptoms can vary from mild to very severe, and they may include: Any of these problems in the lower back, leg, hip, or buttock: Mild tingling, numbness, or dull aches. Burning sensations. Sharp pains. Numbness in the back of the calf or the sole of the foot. Leg weakness. Severe back pain that makes movement difficult. Symptoms may get worse when you cough, sneeze, or laugh, or when you sit or stand for long periods of time. How is this diagnosed? This condition may be diagnosed based on: Your symptoms and  medical history. A physical exam. Blood tests. Imaging tests, such as: X-rays. MRI. CT scan. How is this treated? In many cases, this condition improves on its own without treatment. However, treatment may include: Reducing or modifying physical activity. Exercising and stretching. Icing and applying heat to the affected area. Medicines that help to: Relieve pain and swelling. Relax your muscles. Injections of medicines that help to relieve pain, irritation, and inflammation around the sciatic nerve (steroids). Surgery. Follow these instructions at home: Medicines Take over-the-counter and prescription medicines only as told by your health care provider. Ask your health care provider if the medicine prescribed to you: Requires you to avoid driving or using heavy machinery. Can cause constipation. You may need to take these actions to prevent or treat constipation: Drink enough fluid to keep your urine pale yellow. Take over-the-counter or prescription medicines. Eat foods that are high in fiber, such as beans, whole grains, and fresh fruits and vegetables. Limit foods that are high in fat and processed sugars, such as fried or sweet foods. Managing pain   If directed, put ice on the affected area. Put ice in a plastic bag. Place a towel between your skin and the bag. Leave the ice on for 20 minutes, 2-3 times a day. If directed, apply heat to the affected area. Use the heat source that your health care provider recommends, such as a moist heat pack or a heating pad. Place a towel between your skin and the heat source. Leave the heat on for 20-30 minutes. Remove   the heat if your skin turns bright red. This is especially important if you are unable to feel pain, heat, or cold. You may have a greater risk of getting burned. Activity  Return to your normal activities as told by your health care provider. Ask your health care provider what activities are safe for you. Avoid  activities that make your symptoms worse. Take brief periods of rest throughout the day. When you rest for longer periods, mix in some mild activity or stretching between periods of rest. This will help to prevent stiffness and pain. Avoid sitting for long periods of time without moving. Get up and move around at least one time each hour. Exercise and stretch regularly, as told by your health care provider. Do not lift anything that is heavier than 10 lb (4.5 kg) while you have symptoms of sciatica. When you do not have symptoms, you should still avoid heavy lifting, especially repetitive heavy lifting. When you lift objects, always use proper lifting technique, which includes: Bending your knees. Keeping the load close to your body. Avoiding twisting. General instructions Maintain a healthy weight. Excess weight puts extra stress on your back. Wear supportive, comfortable shoes. Avoid wearing high heels. Avoid sleeping on a mattress that is too soft or too hard. A mattress that is firm enough to support your back when you sleep may help to reduce your pain. Keep all follow-up visits as told by your health care provider. This is important. Contact a health care provider if: You have pain that: Wakes you up when you are sleeping. Gets worse when you lie down. Is worse than you have experienced in the past. Lasts longer than 4 weeks. You have an unexplained weight loss. Get help right away if: You are not able to control when you urinate or have bowel movements (incontinence). You have: Weakness in your lower back, pelvis, buttocks, or legs that gets worse. Redness or swelling of your back. A burning sensation when you urinate. Summary Sciatica is pain, numbness, weakness, or tingling along the path of the sciatic nerve. This condition is caused by pressure on the sciatic nerve or pinching of the nerve. Sciatica can cause pain, numbness, or tingling in the lower back, legs, hips, and  buttocks. Treatment often includes rest, exercise, medicines, and applying ice or heat. This information is not intended to replace advice given to you by your health care provider. Make sure you discuss any questions you have with your health care provider. Document Revised: 11/08/2018 Document Reviewed: 11/08/2018 Elsevier Patient Education  2022 Elsevier Inc.  

## 2021-07-09 ENCOUNTER — Other Ambulatory Visit: Payer: Self-pay | Admitting: Family

## 2021-07-09 DIAGNOSIS — Z1231 Encounter for screening mammogram for malignant neoplasm of breast: Secondary | ICD-10-CM

## 2021-07-19 ENCOUNTER — Encounter: Payer: Self-pay | Admitting: Family

## 2021-07-19 ENCOUNTER — Ambulatory Visit (INDEPENDENT_AMBULATORY_CARE_PROVIDER_SITE_OTHER): Payer: Medicare HMO

## 2021-07-19 ENCOUNTER — Ambulatory Visit (INDEPENDENT_AMBULATORY_CARE_PROVIDER_SITE_OTHER): Payer: Medicare HMO | Admitting: Family

## 2021-07-19 ENCOUNTER — Other Ambulatory Visit: Payer: Self-pay

## 2021-07-19 VITALS — BP 117/66 | HR 75 | Temp 97.4°F | Ht 62.0 in | Wt 185.0 lb

## 2021-07-19 DIAGNOSIS — Z23 Encounter for immunization: Secondary | ICD-10-CM | POA: Diagnosis not present

## 2021-07-19 DIAGNOSIS — M25562 Pain in left knee: Secondary | ICD-10-CM

## 2021-07-19 MED ORDER — PREDNISONE 10 MG (21) PO TBPK: ORAL_TABLET | ORAL | 0 refills | Status: DC

## 2021-07-19 NOTE — Progress Notes (Signed)
Subjective:    Patient ID: Charlotte Aguirre, female    DOB: 1946/09/17, 75 y.o.   MRN: [id number]  Chief Complaint  Patient presents with   Knee Pain   Pt presents to the office today left knee pain that started over a year, but has worsen. She states it has started to "give out" and can not walk down stairs. She reports she fell several months and has noticed increase in her pain since then.  Knee Pain  The incident occurred more than 1 week ago. The injury mechanism was a fall. The pain is present in the left knee. The quality of the pain is described as aching. The pain is at a severity of 8/10. The pain is moderate. The pain has been Intermittent since onset. Pertinent negatives include no loss of motion, loss of sensation, numbness or tingling. She reports no foreign bodies present. The symptoms are aggravated by weight bearing and palpation. She has tried acetaminophen for the symptoms. The treatment provided mild relief.     Review of Systems  Neurological:  Negative for tingling and numbness.  All other systems reviewed and are negative.     Objective:   Physical Exam Vitals reviewed.  Constitutional:      General: She is not in acute distress.    Appearance: She is well-developed.  HENT:     Head: Normocephalic and atraumatic.  Eyes:     Pupils: Pupils are equal, round, and reactive to light.  Neck:     Thyroid: No thyromegaly.  Cardiovascular:     Rate and Rhythm: Normal rate and regular rhythm.     Heart sounds: Normal heart sounds. No murmur heard. Pulmonary:     Effort: Pulmonary effort is normal. No respiratory distress.     Breath sounds: Normal breath sounds. No wheezing.  Abdominal:     General: Bowel sounds are normal. There is no distension.     Palpations: Abdomen is soft.     Tenderness: There is no abdominal tenderness.  Musculoskeletal:        General: No tenderness.     Cervical back: Normal range of motion and neck supple.     Comments: Pain in left  knee with flexion  Skin:    General: Skin is warm and dry.  Neurological:     Mental Status: She is alert and oriented to person, place, and time.     Cranial Nerves: No cranial nerve deficit.     Deep Tendon Reflexes: Reflexes are normal and symmetric.  Psychiatric:        Behavior: Behavior normal.        Thought Content: Thought content normal.        Judgment: Judgment normal.     BP 117/66   Pulse 75   Temp (!) 97.4 F (36.3 C) (Oral)   Ht '5\' 2"'$  (1.575 m)   Wt 185 lb (83.9 kg)   SpO2 97%   BMI 33.84 kg/m       Assessment & Plan:  Chauncey Cruel Dunckel comes in today with chief complaint of Knee Pain   Diagnosis and orders addressed:  1. Acute pain of left knee Rest Ice  Knee brace ROM exercises  - DG Knee 1-2 Views Left - predniSONE (STERAPRED UNI-PAK 21 TAB) 10 MG (21) TBPK tablet; Use as directed  Dispense: 21 tablet; Refill: 0 - Ambulatory referral to Physical Therapy  2. Need for immunization against influenza  - Flu Vaccine QUAD High Dose(Fluad)  Evelina Dun, FNP

## 2021-07-19 NOTE — Patient Instructions (Signed)
Chronic Knee Pain, Adult °Chronic knee pain is pain in one or both knees that lasts longer than 3 months. Symptoms of chronic knee pain may include swelling, stiffness, and discomfort. Age-related wear and tear (osteoarthritis) of the knee joint is the most common cause of chronic knee pain. Other possible causes include: °A long-term immune-related disease that causes inflammation of the knee (rheumatoid arthritis). This usually affects both knees. °Inflammatory arthritis, such as gout or pseudogout. °An injury to the knee that causes arthritis. °An injury to the knee that damages the ligaments. Ligaments are strong tissues that connect bones to each other. °Runner's knee or pain behind the kneecap. °Treatment for chronic knee pain depends on the cause. The main treatments for chronic knee pain are physical therapy and weight loss. This condition may also be treated with medicines, injections, a knee sleeve or brace, and by using crutches. Rest, ice, pressure (compression), and elevation, also known as RICE therapy, may also be recommended. °Follow these instructions at home: °If you have a knee sleeve or brace: ° °Wear the knee sleeve or brace as told by your health care provider. Remove it only as told by your health care provider. °Loosen it if your toes tingle, become numb, or turn cold and blue. °Keep it clean. °If the sleeve or brace is not waterproof: °Do not let it get wet. °Remove it if allowed by your health care provider, or cover it with a watertight covering when you take a bath or a shower. °Managing pain, stiffness, and swelling °  °If directed, apply heat to the affected area as often as told by your health care provider. Use the heat source that your health care provider recommends, such as a moist heat pack or a heating pad. °If you have a removable knee sleeve or brace, remove it as told by your health care provider. °Place a towel between your skin and the heat source. °Leave the heat on for  20-30 minutes. °Remove the heat if your skin turns bright red. This is especially important if you are unable to feel pain, heat, or cold. You may have a greater risk of getting burned. °If directed, put ice on the affected area. To do this: °If you have a removable knee sleeve or brace, remove it as told by your health care provider. °Put ice in a plastic bag. °Place a towel between your skin and the bag. °Leave the ice on for 20 minutes, 2-3 times a day. °Remove the ice if your skin turns bright red. This is very important. If you cannot feel pain, heat, or cold, you have a greater risk of damage to the area. °Move your toes often to reduce stiffness and swelling. °Raise (elevate) the injured area above the level of your heart while you are sitting or lying down. °Activity °Avoid high-impact activities or exercises, such as running, jumping rope, or doing jumping jacks. °Follow the exercise plan that your health care provider designed for you. Your health care provider may suggest that you: °Avoid activities that make knee pain worse. This may require you to change your exercise routines, sport participation, or job duties. °Wear shoes with cushioned soles. °Avoid sports that require running and sudden changes in direction. °Do physical therapy. Physical therapy is planned to match your needs and abilities. It may include exercises for strength, flexibility, stability, and endurance. °Do exercises that increase balance and strength, such as tai chi and yoga. °Do not use the injured limb to support your body weight   until your health care provider says that you can. Use crutches as told by your health care provider. °Return to your normal activities as told by your health care provider. Ask your health care provider what activities are safe for you. °General instructions °Take over-the-counter and prescription medicines only as told by your health care provider. °Lose weight if you are overweight. Losing even a  little weight can reduce knee pain. Ask your health care provider what your ideal weight is, and how to safely lose extra weight. A dietitian may be able to help you plan your meals. °Do not use any products that contain nicotine or tobacco, such as cigarettes, e-cigarettes, and chewing tobacco. These can delay healing. If you need help quitting, ask your health care provider. °Keep all follow-up visits. This is important. °Contact a health care provider if: °You have knee pain that is not getting better or gets worse. °You are unable to do your physical therapy exercises due to knee pain. °Get help right away if: °Your knee swells and the swelling becomes worse. °You cannot move your knee. °You have severe knee pain. °Summary °Knee pain that lasts more than 3 months is considered chronic knee pain. °The main treatments for chronic knee pain are physical therapy and weight loss. You may also need to take medicines, wear a knee sleeve or brace, use crutches, and apply ice or heat. °Losing even a little weight can reduce knee pain. Ask your health care provider what your ideal weight is, and how to safely lose extra weight. A dietitian may be able to help you plan your meals. °Follow the exercise plan that your health care provider designed for you. °This information is not intended to replace advice given to you by your health care provider. Make sure you discuss any questions you have with your health care provider. °Document Revised: 04/04/2020 Document Reviewed: 04/04/2020 °Elsevier Patient Education © 2022 Elsevier Inc. ° °

## 2021-08-07 ENCOUNTER — Telehealth: Payer: Self-pay | Admitting: Family

## 2021-08-07 DIAGNOSIS — M25562 Pain in left knee: Secondary | ICD-10-CM

## 2021-08-07 NOTE — Telephone Encounter (Signed)
Pt called to let Charlotte Aguirre know that her knee pain has not gotten any better and needs Charlotte Aguirre to go ahead and place order for her to have a CT scan like they discussed at visit.

## 2021-08-08 ENCOUNTER — Ambulatory Visit: Payer: Medicare HMO | Attending: Family

## 2021-08-08 ENCOUNTER — Other Ambulatory Visit: Payer: Self-pay

## 2021-08-08 DIAGNOSIS — M25562 Pain in left knee: Secondary | ICD-10-CM | POA: Insufficient documentation

## 2021-08-08 NOTE — Therapy (Signed)
[facility] [place], [place], [address] Phone: [phone]   Fax:  [phone]  Physical Therapy Evaluation  Patient Details  Name: Charlotte Aguirre MRN: [id number] Date of Birth: [birth year] Referring Provider (PT): Lenna Gilford   Encounter Date: 08/08/2021   PT End of Session - 08/08/21 1247     Visit Number 1    Number of Visits 12    Date for PT Re-Evaluation 09/19/21    PT Start Time 1300    PT Stop Time 1350    PT Time Calculation (min) 50 min    Activity Tolerance Patient tolerated treatment well    Behavior During Therapy [facility] for tasks assessed/performed             Past Medical History:  Diagnosis Date   Anxiety    situational anxiety/stress   Arthritis    thumb and fingers   Cataract    bilateral -LEFT sx completed   Fibromyalgia    GERD (gastroesophageal reflux disease)    on meds   Hyperlipidemia    on meds   Hypertension    on meds   IBS (irritable bowel syndrome)    Memory changes    Osteopenia    Peripheral neuropathy    bilateral feet   Ulcer    gastric   Vitamin D insufficiency     Past Surgical History:  Procedure Laterality Date   ABDOMINAL HYSTERECTOMY     uterus frist and 10 years later both ovaries removed   APPENDECTOMY     BLADDER SURGERY     bladder tack   CATARACT EXTRACTION Left    CHOLECYSTECTOMY     COLONOSCOPY N/A 03/22/2014   Procedure: COLONOSCOPY;  Surgeon: Rogene Houston, MD;  Location: [facility];  Service: Endoscopy;  Laterality: N/A;  930   OOPHORECTOMY     TOE SURGERY Left    2nd toe on RIGHT foot w/pins   WISDOM TOOTH EXTRACTION      There were no vitals filed for this visit.    Subjective Assessment - 08/08/21 1247     Subjective Patient reports that she fell about 2-3 months ago. She notes that her left knee was very bruised, but she was not having any pain. She then began to have pain decending her ramp to get out of her house. She has noticed that in  the last 2-3 weeks she has been having an "explosion of pain" and then her leg would give out. She has begun to use a single point cane since this recent pain began.    Pertinent History previous fall    Limitations Walking    How long can you walk comfortably? slight pain (constant); uneven terrain increases pain    Diagnostic tests x-ray: negative    Patient Stated Goals walk her dog, going down stairs, walk without her cane, help with yardwork,    Currently in Pain? Yes    Pain Score 10-Worst pain ever    Pain Location Knee    Pain Orientation Left    Pain Descriptors / Indicators Aching;Shooting    Pain Type Chronic pain    Pain Onset More than a month ago    Pain Frequency Constant    Aggravating Factors  decending stairs, bending her knee,    Pain Relieving Factors none found    Effect of Pain on Daily Activities fear of falling due to the "explosion of pain"  [facility] PT Assessment - 08/08/21 0001       Assessment   Medical Diagnosis Left knee pain    Referring Provider (PT) Hawks    Onset Date/Surgical Date --   2-3 months ago (date unknown)   Next MD Visit none scheduled    Prior Therapy Yes, back and shoulder      Precautions   Precautions None      Restrictions   Weight Bearing Restrictions No      Balance Screen   Has the patient fallen in the past 6 months Yes    How many times? 1    Has the patient had a decrease in activity level because of a fear of falling?  Yes    Is the patient reluctant to leave their home because of a fear of falling?  No      Home Environment   Living Environment Private residence    Living Arrangements Spouse/significant other    Type of [place] entrance    [place] None    Additional Comments Primary caregiver for her husband      Prior Function   Level of Independence Independent    Vocation Retired    Leisure walking her dog      Cognition    Overall Cognitive Status Within Functional Limits for tasks assessed    Attention Focused    Focused Attention Appears intact    Memory Appears intact    Awareness Appears intact    Problem Solving Appears intact      Sensation   Additional Comments Patient reports no numbness and tingling      ROM / Strength   AROM / PROM / Strength AROM;Strength      AROM   AROM Assessment Site Knee    Right/Left Knee Right;Left    Right Knee Extension --   No limitations   Right Knee Flexion --   No limitations   Left Knee Extension 3    Left Knee Flexion 119      Strength   Strength Assessment Site Knee    Right/Left Knee Right;Left    Right Knee Flexion 4+/5    Right Knee Extension 5/5    Left Knee Flexion 4/5   patient reported painful "pop"   Left Knee Extension 4+/5      Palpation   Patella mobility WFL and nonpainful   Tibiofemoral joint mobility: WFL and nonpainful   Palpation comment Left fibular head, hamstrings, LCL, patellar tendon      Special Tests   Knee Special tests  other   McMurray's: negative     Bed Mobility   Bed Mobility Supine to Sit    Supine to Sit Independent   with moderate difficulty     Ambulation/Gait   Assistive device Straight cane    Gait Pattern Within Functional Limits      Balance   Balance Assessed Yes      Static Standing Balance   Static Standing Balance -  Activities  Tandam Stance - Left Leg;Tandam Stance - Right Leg;Romberg - Eyes Opened   significant difficulty with tandem balance: unable to perform                       Objective measurements completed on examination: See above findings.       [place] Adult PT Treatment/Exercise - 08/08/21 0001  Exercises   Exercises Knee/Hip      Knee/Hip Exercises: Standing   Heel Raises Both;20 reps    Heel Raises Limitations Sitting      Knee/Hip Exercises: Seated   Long Arc Quad AROM;Left;20 reps                     PT Education - 08/08/21 1354      Education Details Anatomy, navigating stairs    Person(s) Educated Patient    Methods Explanation    Comprehension Verbalized understanding              PT Short Term Goals - 08/08/21 1409       PT SHORT TERM GOAL #1   Title Patient will report pain at a 7/10 or less with daily activiites such as walking.    Baseline up to a 10/10 yesterday    Time 3    Period Weeks    Status New    Target Date 08/29/21               PT Long Term Goals - 08/08/21 1410       PT LONG TERM GOAL #1   Title Patient will be independent with her home exercise program.    Time 6    Period Weeks    Status New    Target Date 09/19/21      PT LONG TERM GOAL #2   Title Patient will report being able to walk with left knee pain at a 4/10 or less.    Baseline up to 10/10 yesterday    Time 6    Period Weeks    Status New    Target Date 09/19/21      PT LONG TERM GOAL #3   Title Patient will be able to safely walk without an assistive device for at least 20 feet.    Baseline unable    Time 6    Period Weeks    Status New    Target Date 09/19/21                    Plan - 08/08/21 1247     Clinical Impression Statement Patient presented to her initial evaluation with left knee pain secondary to a fall approximately 2-3 months ago. She presented with moderate symptom severity and irritability.    Personal Factors and Comorbidities Time since onset of injury/illness/exacerbation;Past/Current Experience    Examination-Activity Limitations Locomotion Level;Transfers    Examination-Participation Restrictions Church;Cleaning;Community Activity;Yard Work    [employer] Evolving/Moderate complexity    Clinical Decision Making Moderate    Rehab Potential Fair    PT Frequency 2x / week    PT Duration 6 weeks    PT Treatment/Interventions Neuromuscular re-education;Balance training;Therapeutic exercise;Therapeutic activities;Patient/family education;Manual  techniques;Electrical Stimulation;Moist Heat;Vasopneumatic Device    PT Next Visit Plan introduce balance activities    PT Home Exercise Plan LAQ and seated hee/toe raises    Consulted and Agree with Plan of Care Patient             Patient will benefit from skilled therapeutic intervention in order to improve the following deficits and impairments:  Difficulty walking, Pain, Decreased activity tolerance, Decreased balance, Decreased strength  Visit Diagnosis: Left knee pain, unspecified chronicity     Problem List Patient Active Problem List   Diagnosis Date Noted   Edema 04/17/2021   Educated about COVID-19 virus infection 10/11/2019   Hypokalemia 08/02/2019   Obesity (  BMI 30-39.9) 26/33/3545   Metabolic syndrome 62/56/3893   Slow transit constipation 07/27/2015   Rectocele 10/31/2014   Osteopenia 09/13/2014   Prediabetes 06/23/2014   Trigeminal neuralgia syndrome 02/02/2014   Vitamin D deficiency 02/02/2014   Memory impairment 12/12/2013   GERD (gastroesophageal reflux disease)    Essential hypertension    Irritable bowel syndrome 11/06/2009   Hereditary and idiopathic peripheral neuropathy 02/28/2009   Hyperlipidemia with target LDL less than 100 02/27/2009   Allergic rhinitis 02/27/2009   Diverticulosis of colon 02/27/2009   Osteoarthritis 02/27/2009   Myalgia and myositis 02/27/2009   PEPTIC ULCER DISEASE, HX OF 02/27/2009    Darlin Coco, PT 08/08/2021, 2:15 PM  [facility] [address] [place], [place], [address] Phone: [phone]   Fax:  [phone]  Name: Charlotte Aguirre MRN: [id number] Date of Birth: 09/11/1946

## 2021-08-08 NOTE — Telephone Encounter (Signed)
MRI of knee ordered.

## 2021-08-08 NOTE — Telephone Encounter (Signed)
Left detailed message.   

## 2021-08-13 ENCOUNTER — Other Ambulatory Visit: Payer: Self-pay

## 2021-08-13 ENCOUNTER — Ambulatory Visit: Payer: Medicare HMO | Admitting: Physical Therapy

## 2021-08-13 DIAGNOSIS — M25562 Pain in left knee: Secondary | ICD-10-CM

## 2021-08-13 NOTE — Therapy (Signed)
[facility] [place], [place], [address] Phone: [phone]   Fax:  [phone]  Physical Therapy Treatment  Patient Details  Name: Charlotte Aguirre MRN: [id number] Date of Birth: 03/06/1946 Referring Provider (PT): Lenna Gilford   Encounter Date: 08/13/2021   PT End of Session - 08/13/21 1056     Visit Number 2    Number of Visits 12    Date for PT Re-Evaluation 09/19/21    PT Start Time 1030    PT Stop Time 1114    PT Time Calculation (min) 44 min    Activity Tolerance Patient tolerated treatment well;Patient limited by pain    Behavior During Therapy [facility] for tasks assessed/performed             Past Medical History:  Diagnosis Date   Anxiety    situational anxiety/stress   Arthritis    thumb and fingers   Cataract    bilateral -LEFT sx completed   Fibromyalgia    GERD (gastroesophageal reflux disease)    on meds   Hyperlipidemia    on meds   Hypertension    on meds   IBS (irritable bowel syndrome)    Memory changes    Osteopenia    Peripheral neuropathy    bilateral feet   Ulcer    gastric   Vitamin D insufficiency     Past Surgical History:  Procedure Laterality Date   ABDOMINAL HYSTERECTOMY     uterus frist and 10 years later both ovaries removed   APPENDECTOMY     BLADDER SURGERY     bladder tack   CATARACT EXTRACTION Left    CHOLECYSTECTOMY     COLONOSCOPY N/A 03/22/2014   Procedure: COLONOSCOPY;  Surgeon: Rogene Houston, MD;  [facility];  Service: Endoscopy;  Laterality: N/A;  930   OOPHORECTOMY     TOE SURGERY Left    2nd toe on RIGHT foot w/pins   WISDOM TOOTH EXTRACTION      There were no vitals filed for this visit.   Subjective Assessment - 08/13/21 1040     Subjective COVID-19 screening performed proior to entering clinic. Patient reported doing well with some ongoing pain i knee.    Pertinent History previous fall    Limitations Walking    How long can you walk  comfortably? slight pain (constant); uneven terrain increases pain    Diagnostic tests x-ray: negative    Patient Stated Goals walk her dog, going down stairs, walk without her cane, help with yardwork,    Currently in Pain? Yes    Pain Score 5     Pain Location Knee    Pain Orientation Left    Pain Descriptors / Indicators Discomfort    Pain Type Chronic pain    Pain Onset More than a month ago    Pain Frequency Constant    Aggravating Factors  inclines and prolong activity    Pain Relieving Factors rest                               [facility] Adult PT Treatment/Exercise - 08/13/21 0001       Knee/Hip Exercises: Aerobic   Nustep L2 x 29min UE/LE actvity      Knee/Hip Exercises: Standing   SLS standing at table for support a few reps on each LE with SBA for safety      Knee/Hip Exercises: Seated  Long Arc [employer] AROM;Strengthening;Left;2 sets;10 reps   with eccentric control   [facility] 5sec hold x20reps with slight ER for VMO control      Knee/Hip Exercises: Supine   Short Arc Quad Sets Strengthening;Left;2 sets;10 reps;AROM   with eccenric control     Knee/Hip Exercises: Sidelying   Clams x12 reps with eccentric control      Modalities   Modalities Electrical Stimulation;Moist Heat      Moist Heat Therapy   Number Minutes Moist Heat 10 Minutes    Moist Heat Location Knee   left     Electrical Stimulation   Electrical Stimulation Location left knee    Electrical Stimulation Action premod    Electrical Stimulation Parameters 80-150hz  x41min    Electrical Stimulation Goals Pain                       PT Short Term Goals - 08/13/21 1053       PT SHORT TERM GOAL #1   Title Patient will report pain at a 7/10 or less with daily activiites such as walking.    Time 3    Period Weeks    Status On-going    Target Date 08/29/21               PT Long Term Goals - 08/13/21 1054       PT LONG TERM GOAL #1   Title Patient will be  independent with her home exercise program.    Time 6    Period Weeks    Status On-going      PT LONG TERM GOAL #2   Title Patient will report being able to walk with left knee pain at a 4/10 or less.    Time 6    Period Weeks    Status On-going      PT LONG TERM GOAL #3   Title Patient will be able to safely walk without an assistive device for at least 20 feet.    Time 6    Period Weeks    Status On-going                   Plan - 08/13/21 1054     Clinical Impression Statement Patient tolerated treatment well today. Patient able to progress with light knee strengthening activities. Patient has less pain in knee today yet has limitations with activity due to pain increase. Difficulty with balance today due to weakness and pain. Patient responded well today to modalites. Goals progressing.    Personal Factors and Comorbidities Time since onset of injury/illness/exacerbation;Past/Current Experience    Examination-Activity Limitations Locomotion Level;Transfers    Examination-Participation Restrictions Church;Cleaning;Community Activity;Yard Work    [employer]chant [employer] officer Evolving/Moderate complexity    Rehab Potential Fair    PT Frequency 2x / week    PT Duration 6 weeks    PT Treatment/Interventions Neuromuscular re-education;Balance training;Therapeutic exercise;Therapeutic activities;Patient/family education;Manual techniques;Electrical Stimulation;Moist Heat;Vasopneumatic Device    PT Next Visit Plan cont with POC and continue to introduce balance activities    Consulted and Agree with Plan of Care Patient             Patient will benefit from skilled therapeutic intervention in order to improve the following deficits and impairments:  Difficulty walking, Pain, Decreased activity tolerance, Decreased balance, Decreased strength  Visit Diagnosis: Left knee pain, unspecified chronicity     Problem List Patient Active Problem List   Diagnosis Date  Noted  Edema 04/17/2021   Educated about COVID-19 virus infection 10/11/2019   Hypokalemia 08/02/2019   Obesity (BMI 30-39.9) 60/60/0459   Metabolic syndrome 97/74/1423   Slow transit constipation 07/27/2015   Rectocele 10/31/2014   Osteopenia 09/13/2014   Prediabetes 06/23/2014   Trigeminal neuralgia syndrome 02/02/2014   Vitamin D deficiency 02/02/2014   Memory impairment 12/12/2013   GERD (gastroesophageal reflux disease)    Essential hypertension    Irritable bowel syndrome 11/06/2009   Hereditary and idiopathic peripheral neuropathy 02/28/2009   Hyperlipidemia with target LDL less than 100 02/27/2009   Allergic rhinitis 02/27/2009   Diverticulosis of colon 02/27/2009   Osteoarthritis 02/27/2009   Myalgia and myositis 02/27/2009   PEPTIC ULCER DISEASE, HX OF 02/27/2009    Alorah Mcree P, PTA 08/13/2021, 11:15 AM  [facility] [place], [place], [address] Phone: [phone]   Fax:  [phone]  Name: Charlotte Aguirre MRN: [id number] Date of Birth: 04-27-1946

## 2021-08-15 ENCOUNTER — Ambulatory Visit: Payer: Medicare HMO

## 2021-08-15 ENCOUNTER — Other Ambulatory Visit: Payer: Self-pay

## 2021-08-15 DIAGNOSIS — M25562 Pain in left knee: Secondary | ICD-10-CM | POA: Diagnosis not present

## 2021-08-15 NOTE — Therapy (Signed)
[facility] [place], [place], [address] Phone: [phone]   Fax:  [phone]  Physical Therapy Treatment  Patient Details  Name: Charlotte Aguirre MRN: [id number] Date of Birth: 15-Oct-1946 Referring Provider (PT): Lenna Gilford   Encounter Date: 08/15/2021   PT End of Session - 08/15/21 1605     Visit Number 3    Number of Visits 12    Date for PT Re-Evaluation 09/19/21    PT Start Time 1601    PT Stop Time 1700    PT Time Calculation (min) 59 min    Activity Tolerance Patient tolerated treatment well;Patient limited by pain    Behavior During Therapy [facility] for tasks assessed/performed             Past Medical History:  Diagnosis Date   Anxiety    situational anxiety/stress   Arthritis    thumb and fingers   Cataract    bilateral -LEFT sx completed   Fibromyalgia    GERD (gastroesophageal reflux disease)    on meds   Hyperlipidemia    on meds   Hypertension    on meds   IBS (irritable bowel syndrome)    Memory changes    Osteopenia    Peripheral neuropathy    bilateral feet   Ulcer    gastric   Vitamin D insufficiency     Past Surgical History:  Procedure Laterality Date   ABDOMINAL HYSTERECTOMY     uterus frist and 10 years later both ovaries removed   APPENDECTOMY     BLADDER SURGERY     bladder tack   CATARACT EXTRACTION Left    CHOLECYSTECTOMY     COLONOSCOPY N/A 03/22/2014   Procedure: COLONOSCOPY;  Surgeon: Rogene Houston, MD;  [facility];  Service: Endoscopy;  Laterality: N/A;  930   OOPHORECTOMY     TOE SURGERY Left    2nd toe on RIGHT foot w/pins   WISDOM TOOTH EXTRACTION      There were no vitals filed for this visit.   Subjective Assessment - 08/15/21 1604     Subjective COVID-19 screening performed proior to entering clinic. Patient reported that her knee does not hurt as long as she is not walking down hills or steps.    Pertinent History previous fall    Limitations  Walking    How long can you walk comfortably? slight pain (constant); uneven terrain increases pain    Diagnostic tests x-ray: negative    Patient Stated Goals walk her dog, going down stairs, walk without her cane, help with yardwork,    Pain Onset More than a month ago                               [facility] Adult PT Treatment/Exercise - 08/15/21 0001       Knee/Hip Exercises: Aerobic   Nustep L3 x 10 minutes      Knee/Hip Exercises: Standing   Forward Step Up Both;2 sets;5 reps;Hand Hold: 2;Step Height: 2";Limitations    Forward Step Up Limitations Significant difficulty and moderate left knee pain      Knee/Hip Exercises: Seated   Long Arc Quad Left;2 sets;15 reps    Long Arc Quad Weight 2 lbs.    Marching Both;20 reps   supine     Knee/Hip Exercises: Supine   Straight Leg Raises 1 set;10 reps;Left;Strengthening      Modalities  Modalities Teacher, English as a foreign language Location left knee    Electrical Stimulation Action pre mod    Electrical Stimulation Parameters 80-15 Hz x 15 minutes    Electrical Stimulation Goals Pain      Manual Therapy   Manual Therapy Joint mobilization    Joint Mobilization patellar joint mobilizations                       PT Short Term Goals - 08/13/21 1053       PT SHORT TERM GOAL #1   Title Patient will report pain at a 7/10 or less with daily activiites such as walking.    Time 3    Period Weeks    Status On-going    Target Date 08/29/21               PT Long Term Goals - 08/13/21 1054       PT LONG TERM GOAL #1   Title Patient will be independent with her home exercise program.    Time 6    Period Weeks    Status On-going      PT LONG TERM GOAL #2   Title Patient will report being able to walk with left knee pain at a 4/10 or less.    Time 6    Period Weeks    Status On-going      PT LONG TERM GOAL #3   Title Patient will be able to  safely walk without an assistive device for at least 20 feet.    Time 6    Period Weeks    Status On-going                   Plan - 08/15/21 1612     Clinical Impression Statement Patient presented to treatment with none of her familiar knee pain. However, she experienced her familiar "explosion" of pain when transferring from standing to sitting. Manual therapy focused on joint mobilizations to the patella for imporved knee mobility and reduced pain. This was slightly effective at reducing her pain. However, she experienced an increase in her familiar pain with step ups when leading with the left leg. Electrical stimulation was utilized upon the conclusion of treatment and it was effective at reducing her pain. Recommend that she continue with skilled physical therapy to address her remaining impairments to return to her prior level of function.    Personal Factors and Comorbidities Time since onset of injury/illness/exacerbation;Past/Current Experience    Examination-Activity Limitations Locomotion Level;Transfers    Examination-Participation Restrictions Church;Cleaning;Community Activity;Yard Work    Merchant [employer] officer Evolving/Moderate complexity    Rehab Potential Fair    PT Frequency 2x / week    PT Duration 6 weeks    PT Treatment/Interventions Neuromuscular re-education;Balance training;Therapeutic exercise;Therapeutic activities;Patient/family education;Manual techniques;Electrical Stimulation;Moist Heat;Vasopneumatic Device    PT Next Visit Plan cont with POC and continue to introduce balance activities    Consulted and Agree with Plan of Care Patient             Patient will benefit from skilled therapeutic intervention in order to improve the following deficits and impairments:  Difficulty walking, Pain, Decreased activity tolerance, Decreased balance, Decreased strength  Visit Diagnosis: Left knee pain, unspecified chronicity     Problem  List Patient Active Problem List   Diagnosis Date Noted   Edema 04/17/2021   Educated about COVID-19 virus infection 10/11/2019  Hypokalemia 08/02/2019   Obesity (BMI 30-39.9) 73/22/0254   Metabolic syndrome 27/04/2375   Slow transit constipation 07/27/2015   Rectocele 10/31/2014   Osteopenia 09/13/2014   Prediabetes 06/23/2014   Trigeminal neuralgia syndrome 02/02/2014   Vitamin D deficiency 02/02/2014   Memory impairment 12/12/2013   GERD (gastroesophageal reflux disease)    Essential hypertension    Irritable bowel syndrome 11/06/2009   Hereditary and idiopathic peripheral neuropathy 02/28/2009   Hyperlipidemia with target LDL less than 100 02/27/2009   Allergic rhinitis 02/27/2009   Diverticulosis of colon 02/27/2009   Osteoarthritis 02/27/2009   Myalgia and myositis 02/27/2009   PEPTIC ULCER DISEASE, HX OF 02/27/2009    Darlin Coco, PT 08/15/2021, 5:06 PM  [facility] [address] [place], [place], [address] Phone: [phone]   Fax:  [phone]  Name: Charlotte Aguirre MRN: [id number] Date of Birth: 25-Dec-1945

## 2021-08-19 ENCOUNTER — Other Ambulatory Visit: Payer: Self-pay

## 2021-08-19 ENCOUNTER — Ambulatory Visit: Payer: Medicare HMO

## 2021-08-19 DIAGNOSIS — M25562 Pain in left knee: Secondary | ICD-10-CM

## 2021-08-19 NOTE — Therapy (Signed)
[facility] [place], [place], [address] Phone: [phone]   Fax:  [phone]  Physical Therapy Treatment  Patient Details  Name: Charlotte Aguirre MRN: [id number] Date of Birth: November 26, 1945 Referring Provider (PT): Lenna Gilford   Encounter Date: 08/19/2021   PT End of Session - 08/19/21 1043     Visit Number 4    Number of Visits 12    Date for PT Re-Evaluation 09/19/21    PT Start Time 1032    PT Stop Time 1140    PT Time Calculation (min) 68 min    Activity Tolerance Patient tolerated treatment well;Patient limited by pain    Behavior During Therapy [facility] for tasks assessed/performed             Past Medical History:  Diagnosis Date   Anxiety    situational anxiety/stress   Arthritis    thumb and fingers   Cataract    bilateral -LEFT sx completed   Fibromyalgia    GERD (gastroesophageal reflux disease)    on meds   Hyperlipidemia    on meds   Hypertension    on meds   IBS (irritable bowel syndrome)    Memory changes    Osteopenia    Peripheral neuropathy    bilateral feet   Ulcer    gastric   Vitamin D insufficiency     Past Surgical History:  Procedure Laterality Date   ABDOMINAL HYSTERECTOMY     uterus frist and 10 years later both ovaries removed   APPENDECTOMY     BLADDER SURGERY     bladder tack   CATARACT EXTRACTION Left    CHOLECYSTECTOMY     COLONOSCOPY N/A 03/22/2014   Procedure: COLONOSCOPY;  Surgeon: Rogene Houston, MD;  [facility];  Service: Endoscopy;  Laterality: N/A;  930   OOPHORECTOMY     TOE SURGERY Left    2nd toe on RIGHT foot w/pins   WISDOM TOOTH EXTRACTION      There were no vitals filed for this visit.   Subjective Assessment - 08/19/21 1032     Subjective COVID-19 screening performed proior to entering clinic. Patient reports that the sciatica in her left hip was really hurting after her last appointment. She notes that by the time she got home it was really  difficult getting out of her car. She reported that it was an ache in her hip than ran down to her knee. She was told that her CT scan was denied, but if it is not better after physical therapy then she will be referred to an orthopedic specialist.    Pertinent History previous fall    Limitations Walking    How long can you walk comfortably? slight pain (constant); uneven terrain increases pain    Diagnostic tests x-ray: negative    Patient Stated Goals walk her dog, going down stairs, walk without her cane, help with yardwork,    Currently in Pain? No/denies    Pain Onset More than a month ago                               [facility] Adult PT Treatment/Exercise - 08/19/21 0001       Knee/Hip Exercises: Stretches   Passive Hamstring Stretch 3 reps;30 seconds;Left      Knee/Hip Exercises: Aerobic   Nustep L2 x 10 minutes      Knee/Hip Exercises: Seated  Hamstring Curl 20 reps;Left;2 sets   hamstring set (1 set); hamstring curl (1 set BLE)     Electrical Stimulation   Electrical Stimulation Location left knee    Electrical Stimulation Action pre mod    Electrical Stimulation Parameters 80-150 Hz x 20 minutes    Electrical Stimulation Goals Pain      Manual Therapy   Manual Therapy Joint mobilization    Joint Mobilization tibiofemoral distraction with PROM                 Balance Exercises - 08/19/21 0001       Balance Exercises: Standing   Marching Upper extremity assist 1;Foam/compliant surface   3 minutes                 PT Short Term Goals - 08/13/21 1053       PT SHORT TERM GOAL #1   Title Patient will report pain at a 7/10 or less with daily activiites such as walking.    Time 3    Period Weeks    Status On-going    Target Date 08/29/21               PT Long Term Goals - 08/13/21 1054       PT LONG TERM GOAL #1   Title Patient will be independent with her home exercise program.    Time 6    Period Weeks    Status  On-going      PT LONG TERM GOAL #2   Title Patient will report being able to walk with left knee pain at a 4/10 or less.    Time 6    Period Weeks    Status On-going      PT LONG TERM GOAL #3   Title Patient will be able to safely walk without an assistive device for at least 20 feet.    Time 6    Period Weeks    Status On-going                   Plan - 08/19/21 1043     Clinical Impression Statement Patient presented to treatment reporting a significant increase in left hip and knee pain following her last appointment. However, this pain has not persisted to today. She was introduced to multiple new interventions for hamstring mobility and enagement. She required minimal cuing with hamstring setting for proper performance to facilitate hamstring engagement. She experienced a mild increase in her familiar knee pain with marching on the foam pad. However, this was able to be significantly reduced with manual therapy which focused on tibiofemoral distraction with passive range of motion. This was followed by electrical stimulation to the left lateral quadraceps. She reported that her knee felt good upon the conclusion of treatment. She continues to require skilled physical therapy to address his remaining impairments to return to his prior level of function.    Personal Factors and Comorbidities Time since onset of injury/illness/exacerbation;Past/Current Experience    Examination-Activity Limitations Locomotion Level;Transfers    Examination-Participation Restrictions Church;Cleaning;Community Activity;Yard Work    Merchant [employer] officer Evolving/Moderate complexity    Rehab Potential Fair    PT Frequency 2x / week    PT Duration 6 weeks    PT Treatment/Interventions Neuromuscular re-education;Balance training;Therapeutic exercise;Therapeutic activities;Patient/family education;Manual techniques;Electrical Stimulation;Moist Heat;Vasopneumatic Device    PT Next Visit  Plan cont with POC and continue to introduce balance activities    Consulted and Agree with Plan of Care  Patient             Patient will benefit from skilled therapeutic intervention in order to improve the following deficits and impairments:  Difficulty walking, Pain, Decreased activity tolerance, Decreased balance, Decreased strength  Visit Diagnosis: Left knee pain, unspecified chronicity     Problem List Patient Active Problem List   Diagnosis Date Noted   Edema 04/17/2021   Educated about COVID-19 virus infection 10/11/2019   Hypokalemia 08/02/2019   Obesity (BMI 30-39.9) 48/35/0757   Metabolic syndrome 32/25/6720   Slow transit constipation 07/27/2015   Rectocele 10/31/2014   Osteopenia 09/13/2014   Prediabetes 06/23/2014   Trigeminal neuralgia syndrome 02/02/2014   Vitamin D deficiency 02/02/2014   Memory impairment 12/12/2013   GERD (gastroesophageal reflux disease)    Essential hypertension    Irritable bowel syndrome 11/06/2009   Hereditary and idiopathic peripheral neuropathy 02/28/2009   Hyperlipidemia with target LDL less than 100 02/27/2009   Allergic rhinitis 02/27/2009   Diverticulosis of colon 02/27/2009   Osteoarthritis 02/27/2009   Myalgia and myositis 02/27/2009   PEPTIC ULCER DISEASE, HX OF 02/27/2009    Darlin Coco, PT 08/19/2021, 11:56 AM  [facility] [address] [place], [place], [address] Phone: [phone]   Fax:  [phone]  Name: Charlotte Aguirre MRN: [id number] Date of Birth: [birth year]

## 2021-08-27 ENCOUNTER — Other Ambulatory Visit: Payer: Self-pay

## 2021-08-27 ENCOUNTER — Ambulatory Visit: Payer: Medicare HMO

## 2021-08-27 DIAGNOSIS — M25562 Pain in left knee: Secondary | ICD-10-CM

## 2021-08-27 NOTE — Therapy (Signed)
[facility] [place], [place], [address] Phone: [phone]   Fax:  [phone]  Physical Therapy Treatment  Patient Details  Name: Charlotte Aguirre MRN: [id number] Date of Birth: [birth year] Referring Provider (PT): Lenna Gilford   Encounter Date: 08/27/2021   PT End of Session - 08/27/21 1045     Visit Number 5    Number of Visits 12    Date for PT Re-Evaluation 09/19/21    PT Start Time 1030    PT Stop Time 1114    PT Time Calculation (min) 44 min    Activity Tolerance Patient tolerated treatment well;Patient limited by pain    Behavior During Therapy [facility] for tasks assessed/performed             Past Medical History:  Diagnosis Date   Anxiety    situational anxiety/stress   Arthritis    thumb and fingers   Cataract    bilateral -LEFT sx completed   Fibromyalgia    GERD (gastroesophageal reflux disease)    on meds   Hyperlipidemia    on meds   Hypertension    on meds   IBS (irritable bowel syndrome)    Memory changes    Osteopenia    Peripheral neuropathy    bilateral feet   Ulcer    gastric   Vitamin D insufficiency     Past Surgical History:  Procedure Laterality Date   ABDOMINAL HYSTERECTOMY     uterus frist and 10 years later both ovaries removed   APPENDECTOMY     BLADDER SURGERY     bladder tack   CATARACT EXTRACTION Left    CHOLECYSTECTOMY     COLONOSCOPY N/A 03/22/2014   Procedure: COLONOSCOPY;  Surgeon: Rogene Houston, MD;  [facility];  Service: Endoscopy;  Laterality: N/A;  930   OOPHORECTOMY     TOE SURGERY Left    2nd toe on RIGHT foot w/pins   WISDOM TOOTH EXTRACTION      There were no vitals filed for this visit.   Subjective Assessment - 08/27/21 1031     Subjective COVID-19 screening performed proior to entering clinic. Patient reports that her knee started hurting on Friday when she stepped down a step that was bigger than she thought. She notes that it hurt for a  little while afterward, but it is no longer hurting today.    Pertinent History previous fall    Limitations Walking    How long can you walk comfortably? slight pain (constant); uneven terrain increases pain    Diagnostic tests x-ray: negative    Patient Stated Goals walk her dog, going down stairs, walk without her cane, help with yardwork,    Currently in Pain? No/denies    Pain Onset More than a month ago                               [facility] Adult PT Treatment/Exercise - 08/27/21 0001       Knee/Hip Exercises: Aerobic   Nustep L3 x 10 minutes      Knee/Hip Exercises: Standing   Forward Lunges 20 reps;Left   onto 6" step   Forward Step Up Left;2 sets;Step Height: 2";Hand Hold: 2;10 reps    Rocker Board 3 minutes   BUE support   Other Standing Knee Exercises Toe taps onto step   6" step; 20 each     Knee/Hip Exercises:  Seated   Long Arc Quad AROM;Left;20 reps      Manual Therapy   Manual Therapy Joint mobilization;Soft tissue mobilization    Joint Mobilization tibiofemoral distraction with PROM    Soft tissue mobilization quadriceps                       PT Short Term Goals - 08/13/21 1053       PT SHORT TERM GOAL #1   Title Patient will report pain at a 7/10 or less with daily activiites such as walking.    Time 3    Period Weeks    Status On-going    Target Date 08/29/21               PT Long Term Goals - 08/13/21 1054       PT LONG TERM GOAL #1   Title Patient will be independent with her home exercise program.    Time 6    Period Weeks    Status On-going      PT LONG TERM GOAL #2   Title Patient will report being able to walk with left knee pain at a 4/10 or less.    Time 6    Period Weeks    Status On-going      PT LONG TERM GOAL #3   Title Patient will be able to safely walk without an assistive device for at least 20 feet.    Time 6    Period Weeks    Status On-going                   Plan -  08/27/21 1045     Clinical Impression Statement Patient presented to treatment with no knee pain or discormfort. However, she experienced a mild increase in pain and discomfort after completing the Nustep and when performing stand to sit transfers. Manual therapy was able to moderately reduce this familiar pain with tibiofemoral distraction and soft tissue mobilization being the most effective. She was able to complete step ups onto a 2" step without any knee pain today. She reported feeling a little sore upon the conclusion of treatment. She continues to require skilled physical therapy to address her remaining impairments to return to her prior level of function.    Personal Factors and Comorbidities Time since onset of injury/illness/exacerbation;Past/Current Experience    Examination-Activity Limitations Locomotion Level;Transfers    Examination-Participation Restrictions Church;Cleaning;Community Activity;Yard Work    [employer] officer Evolving/Moderate complexity    Rehab Potential Fair    PT Frequency 2x / week    PT Duration 6 weeks    PT Treatment/Interventions Neuromuscular re-education;Balance training;Therapeutic exercise;Therapeutic activities;Patient/family education;Manual techniques;Electrical Stimulation;Moist Heat;Vasopneumatic Device    PT Next Visit Plan cont with POC and continue to introduce balance activities    Consulted and Agree with Plan of Care Patient             Patient will benefit from skilled therapeutic intervention in order to improve the following deficits and impairments:  Difficulty walking, Pain, Decreased activity tolerance, Decreased balance, Decreased strength  Visit Diagnosis: Left knee pain, unspecified chronicity     Problem List Patient Active Problem List   Diagnosis Date Noted   Edema 04/17/2021   Educated about COVID-19 virus infection 10/11/2019   Hypokalemia 08/02/2019   Obesity (BMI 30-39.9) 40/81/4481   Metabolic  syndrome 85/63/1497   Slow transit constipation 07/27/2015   Rectocele 10/31/2014   Osteopenia 09/13/2014   Prediabetes  06/23/2014   Trigeminal neuralgia syndrome 02/02/2014   Vitamin D deficiency 02/02/2014   Memory impairment 12/12/2013   GERD (gastroesophageal reflux disease)    Essential hypertension    Irritable bowel syndrome 11/06/2009   Hereditary and idiopathic peripheral neuropathy 02/28/2009   Hyperlipidemia with target LDL less than 100 02/27/2009   Allergic rhinitis 02/27/2009   Diverticulosis of colon 02/27/2009   Osteoarthritis 02/27/2009   Myalgia and myositis 02/27/2009   PEPTIC ULCER DISEASE, HX OF 02/27/2009    Darlin Coco, PT 08/27/2021, 12:55 PM  [facility] [place], [place], [address] Phone: [phone]   Fax:  [phone]  Name: VERDA MEHTA MRN: [id number] Date of Birth: August 01, 1946

## 2021-08-28 ENCOUNTER — Ambulatory Visit
Admission: RE | Admit: 2021-08-28 | Discharge: 2021-08-28 | Disposition: A | Discharge status: Home / Self Care | Payer: Medicare HMO | Source: Ambulatory Visit | Attending: Family | Admitting: Family

## 2021-08-28 DIAGNOSIS — Z1231 Encounter for screening mammogram for malignant neoplasm of breast: Secondary | ICD-10-CM

## 2021-08-29 ENCOUNTER — Other Ambulatory Visit: Payer: Self-pay

## 2021-08-29 ENCOUNTER — Ambulatory Visit: Payer: Medicare HMO

## 2021-08-29 DIAGNOSIS — M25562 Pain in left knee: Secondary | ICD-10-CM

## 2021-08-29 NOTE — Therapy (Signed)
[facility] [place], [place], [address] Phone: [phone]   Fax:  [phone]  Physical Therapy Treatment  Patient Details  Name: Charlotte Aguirre MRN: [id number] Date of Birth: 03/27/1946 Referring Provider (PT): Lenna Gilford   Encounter Date: 08/29/2021   PT End of Session - 08/29/21 1025     Visit Number 6    Number of Visits 12    Date for PT Re-Evaluation 09/19/21    PT Start Time 1030    PT Stop Time 1116    PT Time Calculation (min) 46 min    Activity Tolerance Patient tolerated treatment well;Patient limited by pain    Behavior During Therapy [facility] for tasks assessed/performed             Past Medical History:  Diagnosis Date   Anxiety    situational anxiety/stress   Arthritis    thumb and fingers   Cataract    bilateral -LEFT sx completed   Fibromyalgia    GERD (gastroesophageal reflux disease)    on meds   Hyperlipidemia    on meds   Hypertension    on meds   IBS (irritable bowel syndrome)    Memory changes    Osteopenia    Peripheral neuropathy    bilateral feet   Ulcer    gastric   Vitamin D insufficiency     Past Surgical History:  Procedure Laterality Date   ABDOMINAL HYSTERECTOMY     uterus frist and 10 years later both ovaries removed   APPENDECTOMY     BLADDER SURGERY     bladder tack   BREAST EXCISIONAL BIOPSY     CATARACT EXTRACTION Left    CHOLECYSTECTOMY     COLONOSCOPY N/A 03/22/2014   Procedure: COLONOSCOPY;  Surgeon: Rogene Houston, MD;  [facility];  Service: Endoscopy;  Laterality: N/A;  930   OOPHORECTOMY     TOE SURGERY Left    2nd toe on RIGHT foot w/pins   WISDOM TOOTH EXTRACTION      There were no vitals filed for this visit.   Subjective Assessment - 08/29/21 1025     Subjective COVID-19 screening performed proior to entering clinic.  Pt denies any pain upon arriving for today's session.  Pt reports that her left knee "really hurt" after her last  treatment on Tuesday.   Pt states that her best friend past away last night and is tearful throughout treatment.    Pertinent History previous fall    Limitations Walking    How long can you walk comfortably? slight pain (constant); uneven terrain increases pain    Diagnostic tests x-ray: negative    Patient Stated Goals walk her dog, going down stairs, walk without her cane, help with yardwork,    Currently in Pain? No/denies    Pain Onset More than a month ago                               [facility] Adult PT Treatment/Exercise - 08/29/21 0001       Knee/Hip Exercises: Aerobic   Nustep Lvl 3 x 10 mins      Knee/Hip Exercises: Standing   Heel Raises Both;20 reps    Heel Raises Limitations Standing, BUE support    Rocker Board 3 minutes      Knee/Hip Exercises: Seated   Long Arc Quad Strengthening;Left;20 reps   3 sec hold at top  Ball Squeeze 20 reps with 5 sec hold    Clamshell with TheraBand Red   20 reps w 3 sec hold   Marching Strengthening;Both;20 reps   cues for posture                Balance Exercises - 08/29/21 0001       Balance Exercises: Standing   Standing Eyes Opened Narrow base of support (BOS);3 reps;30 secs   close sup   Standing Eyes Closed Narrow base of support (BOS);3 reps;30 secs   close sup   Tandem Stance Eyes open;2 reps   close sup                 PT Short Term Goals - 08/13/21 1053       PT SHORT TERM GOAL #1   Title Patient will report pain at a 7/10 or less with daily activiites such as walking.    Time 3    Period Weeks    Status On-going    Target Date 08/29/21               PT Long Term Goals - 08/13/21 1054       PT LONG TERM GOAL #1   Title Patient will be independent with her home exercise program.    Time 6    Period Weeks    Status On-going      PT LONG TERM GOAL #2   Title Patient will report being able to walk with left knee pain at a 4/10 or less.    Time 6    Period Weeks     Status On-going      PT LONG TERM GOAL #3   Title Patient will be able to safely walk without an assistive device for at least 20 feet.    Time 6    Period Weeks    Status On-going                   Plan - 08/29/21 1025     Personal Factors and Comorbidities Time since onset of injury/illness/exacerbation;Past/Current Experience    Examination-Activity Limitations Locomotion Level;Transfers    Examination-Participation Restrictions Church;Cleaning;Community Activity;Yard Work    [employer] officer Evolving/Moderate complexity    Rehab Potential Fair    PT Frequency 2x / week    PT Duration 6 weeks    PT Treatment/Interventions Neuromuscular re-education;Balance training;Therapeutic exercise;Therapeutic activities;Patient/family education;Manual techniques;Electrical Stimulation;Moist Heat;Vasopneumatic Device    PT Next Visit Plan cont with POC and continue to introduce balance activities    Consulted and Agree with Plan of Care Patient             Patient will benefit from skilled therapeutic intervention in order to improve the following deficits and impairments:  Difficulty walking, Pain, Decreased activity tolerance, Decreased balance, Decreased strength  Visit Diagnosis: Left knee pain, unspecified chronicity     Problem List Patient Active Problem List   Diagnosis Date Noted   Edema 04/17/2021   Educated about COVID-19 virus infection 10/11/2019   Hypokalemia 08/02/2019   Obesity (BMI 30-39.9) 09/62/8366   Metabolic syndrome 29/47/6546   Slow transit constipation 07/27/2015   Rectocele 10/31/2014   Osteopenia 09/13/2014   Prediabetes 06/23/2014   Trigeminal neuralgia syndrome 02/02/2014   Vitamin D deficiency 02/02/2014   Memory impairment 12/12/2013   GERD (gastroesophageal reflux disease)    Essential hypertension    Irritable bowel syndrome 11/06/2009   Hereditary and idiopathic peripheral neuropathy 02/28/2009  Hyperlipidemia  with target LDL less than 100 02/27/2009   Allergic rhinitis 02/27/2009   Diverticulosis of colon 02/27/2009   Osteoarthritis 02/27/2009   Myalgia and myositis 02/27/2009   PEPTIC ULCER DISEASE, HX OF 02/27/2009    Kathrynn Ducking, PTA 08/29/2021, 11:18 AM  [facility] [place], [place], [address] Phone: [phone]   Fax:  [phone]  Name: Charlotte Aguirre MRN: [id number] Date of Birth: [birth year]

## 2021-09-02 ENCOUNTER — Telehealth: Payer: Self-pay | Admitting: Physical Therapy

## 2021-09-02 NOTE — Telephone Encounter (Signed)
She has a very bad cold and will not be here this week

## 2021-09-03 ENCOUNTER — Ambulatory Visit: Payer: Medicare HMO | Admitting: Physical Therapy

## 2021-09-05 DIAGNOSIS — R059 Cough, unspecified: Secondary | ICD-10-CM | POA: Diagnosis not present

## 2021-09-05 DIAGNOSIS — J020 Streptococcal pharyngitis: Secondary | ICD-10-CM | POA: Diagnosis not present

## 2021-09-05 DIAGNOSIS — J40 Bronchitis, not specified as acute or chronic: Secondary | ICD-10-CM | POA: Diagnosis not present

## 2021-09-11 ENCOUNTER — Ambulatory Visit: Payer: Medicare HMO | Attending: Family

## 2021-09-11 ENCOUNTER — Other Ambulatory Visit: Payer: Self-pay

## 2021-09-11 DIAGNOSIS — M25562 Pain in left knee: Secondary | ICD-10-CM | POA: Diagnosis not present

## 2021-09-11 NOTE — Therapy (Signed)
[facility] [place], [place], [address] Phone: [phone]   Fax:  [phone]  Physical Therapy Treatment  Patient Details  Name: Charlotte Aguirre MRN: [id number] Date of Birth: 1946-08-01 Referring Provider (PT): Lenna Gilford   Encounter Date: 09/11/2021   PT End of Session - 09/11/21 1103     Visit Number 7    Number of Visits 12    Date for PT Re-Evaluation 09/19/21    PT Start Time 1115    PT Stop Time 1159    PT Time Calculation (min) 44 min    Activity Tolerance Patient tolerated treatment well;Patient limited by pain    Behavior During Therapy [facility] for tasks assessed/performed             Past Medical History:  Diagnosis Date   Anxiety    situational anxiety/stress   Arthritis    thumb and fingers   Cataract    bilateral -LEFT sx completed   Fibromyalgia    GERD (gastroesophageal reflux disease)    on meds   Hyperlipidemia    on meds   Hypertension    on meds   IBS (irritable bowel syndrome)    Memory changes    Osteopenia    Peripheral neuropathy    bilateral feet   Ulcer    gastric   Vitamin D insufficiency     Past Surgical History:  Procedure Laterality Date   ABDOMINAL HYSTERECTOMY     uterus frist and 10 years later both ovaries removed   APPENDECTOMY     BLADDER SURGERY     bladder tack   BREAST EXCISIONAL BIOPSY     CATARACT EXTRACTION Left    CHOLECYSTECTOMY     COLONOSCOPY N/A 03/22/2014   Procedure: COLONOSCOPY;  Surgeon: Rogene Houston, MD;  [facility];  Service: Endoscopy;  Laterality: N/A;  930   OOPHORECTOMY     TOE SURGERY Left    2nd toe on RIGHT foot w/pins   WISDOM TOOTH EXTRACTION      There were no vitals filed for this visit.   Subjective Assessment - 09/11/21 1103     Subjective COVID-19 screening performed proior to entering clinic.  Pt denies any pain upon arriving fo today's treatment session    Pertinent History previous fall    Limitations  Walking    How long can you walk comfortably? slight pain (constant); uneven terrain increases pain    Diagnostic tests x-ray: negative    Patient Stated Goals walk her dog, going down stairs, walk without her cane, help with yardwork,    Currently in Pain? No/denies    Pain Onset More than a month ago                               [facility] Adult PT Treatment/Exercise - 09/11/21 0001       Exercises   Exercises Knee/Hip      Knee/Hip Exercises: Aerobic   Nustep Lvl 3 x 15 mins      Knee/Hip Exercises: Standing   Heel Raises Both;20 reps    Forward Lunges Left;20 reps   6 inch step   Forward Lunges Limitations cues for hold    Forward Step Up Left;2 sets;10 reps;Hand Hold: 2;Step Height: 2"   attempted 4 inch step, but too painful   Forward Step Up Limitations Cues to avoid pull with Acupuncturist  3 minutes      Knee/Hip Exercises: Seated   Long Arc Quad Strengthening;Left;20 reps;Weights    Long Arc Quad Weight 2 lbs.    Ball Squeeze 20 reps with 5 sec hold    Clamshell with TheraBand Red   20 reps with 3 sec hold   Marching Strengthening;Weights    Marching Weights 2 lbs.    Hamstring Curl Strengthening;Left;20 reps   cues for eccentric control   Sit to Sand 10 reps;without UE support   first 2 reps with UE, last 8 without, increased difficulty without UE                      PT Short Term Goals - 08/13/21 1053       PT SHORT TERM GOAL #1   Title Patient will report pain at a 7/10 or less with daily activiites such as walking.    Time 3    Period Weeks    Status On-going    Target Date 08/29/21               PT Long Term Goals - 08/13/21 1054       PT LONG TERM GOAL #1   Title Patient will be independent with her home exercise program.    Time 6    Period Weeks    Status On-going      PT LONG TERM GOAL #2   Title Patient will report being able to walk with left knee pain at a 4/10 or less.    Time 6    Period  Weeks    Status On-going      PT LONG TERM GOAL #3   Title Patient will be able to safely walk without an assistive device for at least 20 feet.    Time 6    Period Weeks    Status On-going                   Plan - 09/11/21 1103     Clinical Impression Statement Pt arrived for today's treatment session denying any pain.  Pt states that she has not been doing much other than walking the dog lately due to being sick.  Pt attempted forward step ups with 4 inch step, but unable to perform more than 2 reps due to 5/10 left knee pain.  With step decreased to 2 in step pt able to perform all reps without complaint of pain.  Pt requiring cues to avoid pulling with BUEs.  Pt with increased difficulty perform sit to stand transfers without upper extremity support, but able to perform all reps asked of her.  Pt denied any pain at completion of today's treatment session, but does endorse left knee fatigue.    Personal Factors and Comorbidities Time since onset of injury/illness/exacerbation;Past/Current Experience    Examination-Activity Limitations Locomotion Level;Transfers    Examination-Participation Restrictions Church;Cleaning;Community Activity;Yard Work    [employer]chant [employer] officer Evolving/Moderate complexity    Rehab Potential Fair    PT Frequency 2x / week    PT Duration 6 weeks    PT Treatment/Interventions Neuromuscular re-education;Balance training;Therapeutic exercise;Therapeutic activities;Patient/family education;Manual techniques;Electrical Stimulation;Moist Heat;Vasopneumatic Device    PT Next Visit Plan cont with POC and continue to introduce balance activities    Consulted and Agree with Plan of Care Patient             Patient will benefit from skilled therapeutic intervention in order to improve the following deficits and  impairments:  Difficulty walking, Pain, Decreased activity tolerance, Decreased balance, Decreased strength  Visit Diagnosis: Left  knee pain, unspecified chronicity     Problem List Patient Active Problem List   Diagnosis Date Noted   Edema 04/17/2021   Educated about COVID-19 virus infection 10/11/2019   Hypokalemia 08/02/2019   Obesity (BMI 30-39.9) 25/36/6440   Metabolic syndrome 34/74/2595   Slow transit constipation 07/27/2015   Rectocele 10/31/2014   Osteopenia 09/13/2014   Prediabetes 06/23/2014   Trigeminal neuralgia syndrome 02/02/2014   Vitamin D deficiency 02/02/2014   Memory impairment 12/12/2013   GERD (gastroesophageal reflux disease)    Essential hypertension    Irritable bowel syndrome 11/06/2009   Hereditary and idiopathic peripheral neuropathy 02/28/2009   Hyperlipidemia with target LDL less than 100 02/27/2009   Allergic rhinitis 02/27/2009   Diverticulosis of colon 02/27/2009   Osteoarthritis 02/27/2009   Myalgia and myositis 02/27/2009   PEPTIC ULCER DISEASE, HX OF 02/27/2009    Kathrynn Ducking, PTA 09/11/2021, 12:03 PM  [facility] [address] [place], [place], [address] Phone: [phone]   Fax:  [phone]  Name: Charlotte Aguirre MRN: [id number] Date of Birth: 11-29-1945

## 2021-09-13 ENCOUNTER — Ambulatory Visit: Payer: Medicare HMO | Admitting: Physical Therapy

## 2021-09-13 ENCOUNTER — Other Ambulatory Visit: Payer: Self-pay

## 2021-09-13 ENCOUNTER — Encounter: Payer: Self-pay | Admitting: Physical Therapy

## 2021-09-13 DIAGNOSIS — M25562 Pain in left knee: Secondary | ICD-10-CM

## 2021-09-13 NOTE — Therapy (Signed)
[facility] [place], [place], [address] Phone: [phone]   Fax:  [phone]  Physical Therapy Treatment  Patient Details  Name: Charlotte Aguirre MRN: [id number] Date of Birth: 1946/09/01 Referring Provider (PT): Lenna Gilford   Encounter Date: 09/13/2021   PT End of Session - 09/13/21 0903     Visit Number 8    Number of Visits 12    Date for PT Re-Evaluation 09/19/21    PT Start Time 0901    PT Stop Time 0945    PT Time Calculation (min) 44 min    Activity Tolerance Patient tolerated treatment well    Behavior During Therapy [facility] for tasks assessed/performed             Past Medical History:  Diagnosis Date   Anxiety    situational anxiety/stress   Arthritis    thumb and fingers   Cataract    bilateral -LEFT sx completed   Fibromyalgia    GERD (gastroesophageal reflux disease)    on meds   Hyperlipidemia    on meds   Hypertension    on meds   IBS (irritable bowel syndrome)    Memory changes    Osteopenia    Peripheral neuropathy    bilateral feet   Ulcer    gastric   Vitamin D insufficiency     Past Surgical History:  Procedure Laterality Date   ABDOMINAL HYSTERECTOMY     uterus frist and 10 years later both ovaries removed   APPENDECTOMY     BLADDER SURGERY     bladder tack   BREAST EXCISIONAL BIOPSY     CATARACT EXTRACTION Left    CHOLECYSTECTOMY     COLONOSCOPY N/A 03/22/2014   Procedure: COLONOSCOPY;  Surgeon: Rogene Houston, MD;  [facility];  Service: Endoscopy;  Laterality: N/A;  930   OOPHORECTOMY     TOE SURGERY Left    2nd toe on RIGHT foot w/pins   WISDOM TOOTH EXTRACTION      There were no vitals filed for this visit.   Subjective Assessment - 09/13/21 0902     Subjective COVID-19 screening performed proior to entering clinic.  Pt denies any pain upon arriving fo today's treatment session. Had an instance of jarring of L knee as she stepped over a small step in her  backdoor. Reports less strength in L knee.    Pertinent History previous fall    Limitations Walking    How long can you walk comfortably? slight pain (constant); uneven terrain increases pain    Diagnostic tests x-ray: negative    Patient Stated Goals walk her dog, going down stairs, walk without her cane, help with yardwork,    Currently in Pain? No/denies                [facility] PT Assessment - 09/13/21 0001       Assessment   Medical Diagnosis Left knee pain    Referring Provider (PT) Hawks    Next MD Visit none scheduled    Prior Therapy Yes, back and shoulder      Precautions   Precautions None      Restrictions   Weight Bearing Restrictions No                           [facility] Adult PT Treatment/Exercise - 09/13/21 0001       Knee/Hip Exercises: Aerobic   Nustep  Lvl 4 x 15 mins      Knee/Hip Exercises: Standing   Hip Abduction AROM;Left;20 reps;Knee straight    Step Down Left;15 reps;Hand Hold: 2;Step Height: 2"      Knee/Hip Exercises: Seated   Long Arc Quad Strengthening;Left;20 reps;Weights    Long Arc Quad Weight 4 lbs.    Clamshell with TheraBand Yellow   x20 reps   Marching Strengthening;Both;2 sets;10 reps    Marching Limitations yellow theraband    Hamstring Curl Strengthening;Left;3 sets;10 reps;Limitations    Hamstring Limitations yellow theraband                       PT Short Term Goals - 08/13/21 1053       PT SHORT TERM GOAL #1   Title Patient will report pain at a 7/10 or less with daily activiites such as walking.    Time 3    Period Weeks    Status On-going    Target Date 08/29/21               PT Long Term Goals - 09/13/21 1020       PT LONG TERM GOAL #1   Title Patient will be independent with her home exercise program.    Time 6    Period Weeks    Status Achieved      PT LONG TERM GOAL #2   Title Patient will report being able to walk with left knee pain at a 4/10 or less.    Time 6     Period Weeks    Status On-going      PT LONG TERM GOAL #3   Title Patient will be able to safely walk without an assistive device for at least 20 feet.    Time 6    Period Weeks    Status Achieved                   Plan - 09/13/21 1018     Clinical Impression Statement Patient presented in clinic with reports of no R knee pain upon arrival. Patient progressed through strengthening exercises with slow and purposeful pace to avoid any increased pain. New standing exercises introduced to evaluate pain levels. No increased pain with any exercises completed today. No pain reported by patient with walking on level surfaces or when going down ramps anymore.    Personal Factors and Comorbidities Time since onset of injury/illness/exacerbation;Past/Current Experience    Examination-Activity Limitations Locomotion Level;Transfers    Examination-Participation Restrictions Church;Cleaning;Community Activity;Yard Work    [employer]chant [employer] officer Evolving/Moderate complexity    Rehab Potential Fair    PT Frequency 2x / week    PT Duration 6 weeks    PT Treatment/Interventions Neuromuscular re-education;Balance training;Therapeutic exercise;Therapeutic activities;Patient/family education;Manual techniques;Electrical Stimulation;Moist Heat;Vasopneumatic Device    PT Next Visit Plan cont with POC and continue to introduce balance activities    PT Home Exercise Plan LAQ and seated hee/toe raises    Consulted and Agree with Plan of Care Patient             Patient will benefit from skilled therapeutic intervention in order to improve the following deficits and impairments:  Difficulty walking, Pain, Decreased activity tolerance, Decreased balance, Decreased strength  Visit Diagnosis: Left knee pain, unspecified chronicity     Problem List Patient Active Problem List   Diagnosis Date Noted   Edema 04/17/2021   Educated about COVID-19 virus infection 10/11/2019    Hypokalemia 08/02/2019  Obesity (BMI 30-39.9) 40/08/2724   Metabolic syndrome 36/64/4034   Slow transit constipation 07/27/2015   Rectocele 10/31/2014   Osteopenia 09/13/2014   Prediabetes 06/23/2014   Trigeminal neuralgia syndrome 02/02/2014   Vitamin D deficiency 02/02/2014   Memory impairment 12/12/2013   GERD (gastroesophageal reflux disease)    Essential hypertension    Irritable bowel syndrome 11/06/2009   Hereditary and idiopathic peripheral neuropathy 02/28/2009   Hyperlipidemia with target LDL less than 100 02/27/2009   Allergic rhinitis 02/27/2009   Diverticulosis of colon 02/27/2009   Osteoarthritis 02/27/2009   Myalgia and myositis 02/27/2009   PEPTIC ULCER DISEASE, HX OF 02/27/2009    Standley Brooking, PTA 09/13/2021, 10:21 AM  [facility] [address] [place], [place], [address] Phone: [phone]   Fax:  [phone]  Name: Charlotte Aguirre MRN: [id number] Date of Birth: 10-22-1946

## 2021-09-17 ENCOUNTER — Encounter: Payer: Self-pay | Admitting: Physical Therapy

## 2021-09-17 ENCOUNTER — Other Ambulatory Visit: Payer: Self-pay

## 2021-09-17 ENCOUNTER — Ambulatory Visit: Payer: Medicare HMO | Admitting: Physical Therapy

## 2021-09-17 DIAGNOSIS — M25562 Pain in left knee: Secondary | ICD-10-CM

## 2021-09-17 NOTE — Therapy (Signed)
[facility] [place], [place], [address] Phone: [phone]   Fax:  [phone]  Physical Therapy Treatment  Patient Details  Name: Charlotte Aguirre MRN: [id number] Date of Birth: 18-Jun-1946 Referring Provider (PT): Lenna Gilford   Encounter Date: 09/17/2021   PT End of Session - 09/17/21 1511     Visit Number 9    Number of Visits 12    Date for PT Re-Evaluation 09/19/21    PT Start Time 0230    PT Stop Time 0303    PT Time Calculation (min) 33 min    Activity Tolerance Patient tolerated treatment well    Behavior During Therapy [facility] for tasks assessed/performed             Past Medical History:  Diagnosis Date   Anxiety    situational anxiety/stress   Arthritis    thumb and fingers   Cataract    bilateral -LEFT sx completed   Fibromyalgia    GERD (gastroesophageal reflux disease)    on meds   Hyperlipidemia    on meds   Hypertension    on meds   IBS (irritable bowel syndrome)    Memory changes    Osteopenia    Peripheral neuropathy    bilateral feet   Ulcer    gastric   Vitamin D insufficiency     Past Surgical History:  Procedure Laterality Date   ABDOMINAL HYSTERECTOMY     uterus frist and 10 years later both ovaries removed   APPENDECTOMY     BLADDER SURGERY     bladder tack   BREAST EXCISIONAL BIOPSY     CATARACT EXTRACTION Left    CHOLECYSTECTOMY     COLONOSCOPY N/A 03/22/2014   Procedure: COLONOSCOPY;  Surgeon: Rogene Houston, MD;  [facility];  Service: Endoscopy;  Laterality: N/A;  930   OOPHORECTOMY     TOE SURGERY Left    2nd toe on RIGHT foot w/pins   WISDOM TOOTH EXTRACTION      There were no vitals filed for this visit.   Subjective Assessment - 09/17/21 1449     Subjective COVID-19 screen performed prior to patient entering clinic.  No pain.    Pertinent History previous fall    How long can you walk comfortably? slight pain (constant); uneven terrain increases pain     Diagnostic tests x-ray: negative    Patient Stated Goals walk her dog, going down stairs, walk without her cane, help with yardwork,    Currently in Pain? No/denies    Pain Location Knee    Pain Orientation Left    Pain Descriptors / Indicators Discomfort    Pain Type Chronic pain    Pain Onset More than a month ago                               [facility] Adult PT Treatment/Exercise - 09/17/21 0001       Exercises   Exercises Knee/Hip      Knee/Hip Exercises: Aerobic   Nustep Level 4 x 15 minutes.      Knee/Hip Exercises: Machines for Strengthening   Cybex Knee Extension 10# x 3 minutes.    Cybex Knee Flexion 30# x 3 minutes.    Cybex Leg Press 2 plates x 3 minutes.  PT Short Term Goals - 08/13/21 1053       PT SHORT TERM GOAL #1   Title Patient will report pain at a 7/10 or less with daily activiites such as walking.    Time 3    Period Weeks    Status On-going    Target Date 08/29/21               PT Long Term Goals - 09/13/21 1020       PT LONG TERM GOAL #1   Title Patient will be independent with her home exercise program.    Time 6    Period Weeks    Status Achieved      PT LONG TERM GOAL #2   Title Patient will report being able to walk with left knee pain at a 4/10 or less.    Time 6    Period Weeks    Status On-going      PT LONG TERM GOAL #3   Title Patient will be able to safely walk without an assistive device for at least 20 feet.    Time 6    Period Weeks    Status Achieved                   Plan - 09/17/21 1506     Clinical Impression Statement Patient progressing well with no pain upon presentation to the clinic today.  She states she still cannot do a reciprocating stair gait at this time.  She did great with weight machines today. Slow reps and no pain reproduction.    Personal Factors and Comorbidities Time since onset of injury/illness/exacerbation;Past/Current  Experience    Examination-Activity Limitations Locomotion Level;Transfers    Examination-Participation Restrictions Church;Cleaning;Community Activity;Yard Work    [employer]chant [employer] officer Evolving/Moderate complexity    Rehab Potential Fair    PT Frequency 2x / week    PT Duration 6 weeks    PT Treatment/Interventions Neuromuscular re-education;Balance training;Therapeutic exercise;Therapeutic activities;Patient/family education;Manual techniques;Electrical Stimulation;Moist Heat;Vasopneumatic Device    PT Next Visit Plan cont with POC and continue to introduce balance activities    Consulted and Agree with Plan of Care Patient             Patient will benefit from skilled therapeutic intervention in order to improve the following deficits and impairments:  Difficulty walking, Pain, Decreased activity tolerance, Decreased balance, Decreased strength  Visit Diagnosis: Left knee pain, unspecified chronicity     Problem List Patient Active Problem List   Diagnosis Date Noted   Edema 04/17/2021   Educated about COVID-19 virus infection 10/11/2019   Hypokalemia 08/02/2019   Obesity (BMI 30-39.9) 84/13/2440   Metabolic syndrome 08/30/2535   Slow transit constipation 07/27/2015   Rectocele 10/31/2014   Osteopenia 09/13/2014   Prediabetes 06/23/2014   Trigeminal neuralgia syndrome 02/02/2014   Vitamin D deficiency 02/02/2014   Memory impairment 12/12/2013   GERD (gastroesophageal reflux disease)    Essential hypertension    Irritable bowel syndrome 11/06/2009   Hereditary and idiopathic peripheral neuropathy 02/28/2009   Hyperlipidemia with target LDL less than 100 02/27/2009   Allergic rhinitis 02/27/2009   Diverticulosis of colon 02/27/2009   Osteoarthritis 02/27/2009   Myalgia and myositis 02/27/2009   PEPTIC ULCER DISEASE, HX OF 02/27/2009    Caedmon Louque, [place], PT 09/17/2021, 3:11 PM  [facility] [address] [place], [place], [address] Phone: [phone]   Fax:  [phone]  Name: Charlotte Aguirre MRN: [id number] Date  of Birth: 07-30-1946

## 2021-09-20 ENCOUNTER — Encounter: Payer: Self-pay | Admitting: Physical Therapy

## 2021-09-20 ENCOUNTER — Ambulatory Visit: Payer: Medicare HMO | Admitting: Physical Therapy

## 2021-09-20 ENCOUNTER — Other Ambulatory Visit: Payer: Self-pay

## 2021-09-20 DIAGNOSIS — M25562 Pain in left knee: Secondary | ICD-10-CM | POA: Diagnosis not present

## 2021-09-20 NOTE — Therapy (Addendum)
[facility] [place], [place], [address] Phone: [phone]   Fax:  [phone]  Physical Therapy Treatment  Patient Details  Name: Charlotte Aguirre MRN: [id number] Date of Birth: [birth year] Referring Provider (PT): Lenna Gilford   Encounter Date: 09/20/2021   PT End of Session - 09/20/21 1036     Visit Number 10    Number of Visits 12    Date for PT Re-Evaluation 09/19/21    PT Start Time 1031    PT Stop Time 1113    PT Time Calculation (min) 42 min    Activity Tolerance Patient tolerated treatment well    Behavior During Therapy [facility] for tasks assessed/performed             Past Medical History:  Diagnosis Date   Anxiety    situational anxiety/stress   Arthritis    thumb and fingers   Cataract    bilateral -LEFT sx completed   Fibromyalgia    GERD (gastroesophageal reflux disease)    on meds   Hyperlipidemia    on meds   Hypertension    on meds   IBS (irritable bowel syndrome)    Memory changes    Osteopenia    Peripheral neuropathy    bilateral feet   Ulcer    gastric   Vitamin D insufficiency     Past Surgical History:  Procedure Laterality Date   ABDOMINAL HYSTERECTOMY     uterus frist and 10 years later both ovaries removed   APPENDECTOMY     BLADDER SURGERY     bladder tack   BREAST EXCISIONAL BIOPSY     CATARACT EXTRACTION Left    CHOLECYSTECTOMY     COLONOSCOPY N/A 03/22/2014   Procedure: COLONOSCOPY;  Surgeon: Rogene Houston, MD;  [facility];  Service: Endoscopy;  Laterality: N/A;  930   OOPHORECTOMY     TOE SURGERY Left    2nd toe on RIGHT foot w/pins   WISDOM TOOTH EXTRACTION      There were no vitals filed for this visit.   Subjective Assessment - 09/20/21 1048     Subjective COVID-19 screen performed prior to patient entering clinic.  No pain.    Pertinent History previous fall    Limitations Walking    How long can you walk comfortably? slight pain (constant);  uneven terrain increases pain    Diagnostic tests x-ray: negative    Patient Stated Goals walk her dog, going down stairs, walk without her cane, help with yardwork,    Currently in Pain? No/denies                [facility] PT Assessment - 09/20/21 0001       Assessment   Medical Diagnosis Left knee pain    Referring Provider (PT) Hawks    Next MD Visit none scheduled    Prior Therapy Yes, back and shoulder      Precautions   Precautions None                           [facility] Adult PT Treatment/Exercise - 09/20/21 0001       Knee/Hip Exercises: Aerobic   Nustep Level 4 x 15 minutes.      Knee/Hip Exercises: Machines for Strengthening   Cybex Knee Extension 10# 2x10 reps    Cybex Knee Flexion 30# 2x10 reps    Cybex Leg Press 1 pl, seat 6, 2x10  reps      Knee/Hip Exercises: Standing   Forward Step Up Left;2 sets;10 reps;Hand Hold: 2;Step Height: 4"   very minimal, intermittant "uncomfortable"   Step Down Left;2 sets;10 reps;Hand Hold: 2;Step Height: 4"      Knee/Hip Exercises: Seated   Long Arc Quad Strengthening;Left;20 reps;Weights    Long Arc Quad Weight 4 lbs.                       PT Short Term Goals - 08/13/21 1053       PT SHORT TERM GOAL #1   Title Patient will report pain at a 7/10 or less with daily activiites such as walking.    Time 3    Period Weeks    Status On-going    Target Date 08/29/21               PT Long Term Goals - 09/13/21 1020       PT LONG TERM GOAL #1   Title Patient will be independent with her home exercise program.    Time 6    Period Weeks    Status Achieved      PT LONG TERM GOAL #2   Title Patient will report being able to walk with left knee pain at a 4/10 or less.    Time 6    Period Weeks    Status On-going      PT LONG TERM GOAL #3   Title Patient will be able to safely walk without an assistive device for at least 20 feet.    Time 6    Period Weeks    Status Achieved                    Plan - 09/20/21 1128     Clinical Impression Statement Patient has been very pleased with her progress. Machine strengthening continued today with emphasis on slow and controlled pace. Patient reported still having trouble with steps. Patient does not have stairs at home other than one step into her home. Has ramps to get into and out of her home. Patient very pleased to experience none to minimal "uncomfortable" sensation with 4" step up.    Personal Factors and Comorbidities Time since onset of injury/illness/exacerbation;Past/Current Experience    Examination-Activity Limitations Locomotion Level;Transfers    Examination-Participation Restrictions Church;Cleaning;Community Activity;Yard Work    [employer]chant [employer] officer Evolving/Moderate complexity    Rehab Potential Fair    PT Frequency 2x / week    PT Duration 6 weeks    PT Treatment/Interventions Neuromuscular re-education;Balance training;Therapeutic exercise;Therapeutic activities;Patient/family education;Manual techniques;Electrical Stimulation;Moist Heat;Vasopneumatic Device    PT Next Visit Plan cont with POC and continue to introduce balance activities    PT Home Exercise Plan LAQ and seated hee/toe raises    Consulted and Agree with Plan of Care Patient             Patient will benefit from skilled therapeutic intervention in order to improve the following deficits and impairments:  Difficulty walking, Pain, Decreased activity tolerance, Decreased balance, Decreased strength  Visit Diagnosis: Left knee pain, unspecified chronicity     Problem List Patient Active Problem List   Diagnosis Date Noted   Edema 04/17/2021   Educated about COVID-19 virus infection 10/11/2019   Hypokalemia 08/02/2019   Obesity (BMI 30-39.9) 34/19/6222   Metabolic syndrome 97/98/9211   Slow transit constipation 07/27/2015   Rectocele 10/31/2014   Osteopenia 09/13/2014   Prediabetes 06/23/2014  Trigeminal  neuralgia syndrome 02/02/2014   Vitamin D deficiency 02/02/2014   Memory impairment 12/12/2013   GERD (gastroesophageal reflux disease)    Essential hypertension    Irritable bowel syndrome 11/06/2009   Hereditary and idiopathic peripheral neuropathy 02/28/2009   Hyperlipidemia with target LDL less than 100 02/27/2009   Allergic rhinitis 02/27/2009   Diverticulosis of colon 02/27/2009   Osteoarthritis 02/27/2009   Myalgia and myositis 02/27/2009   PEPTIC ULCER DISEASE, HX OF 02/27/2009    Standley Brooking, PTA 09/20/2021, 11:31 AM  [facility] [address] [place], [place], [address] Phone: [phone]   Fax:  [phone]  Name: Charlotte Aguirre MRN: [id number] Date of Birth: 1946/03/03  Progress Note Reporting Period 08/08/21 to 09/20/21  See note below for Objective Data and Assessment of Progress/Goals.   Patient is making good progress toward her goals as she is able to safely ambulate without the need for an assistive device. However, she continues to experience increased left knee pain with navigating stairs continuing to be difficult and painful. Recommend that she continue with her current plan of care to address her remaining impairments to return to her prior level of function.   Jacqulynn Cadet, PT, DPT

## 2021-09-24 ENCOUNTER — Ambulatory Visit: Payer: Medicare HMO | Admitting: Physical Therapy

## 2021-09-24 ENCOUNTER — Other Ambulatory Visit: Payer: Self-pay

## 2021-09-24 DIAGNOSIS — M25562 Pain in left knee: Secondary | ICD-10-CM | POA: Diagnosis not present

## 2021-09-24 NOTE — Therapy (Signed)
[facility] [place], [place], [address] Phone: [phone]   Fax:  [phone]  Physical Therapy Treatment  Patient Details  Name: Charlotte Aguirre MRN: [id number] Date of Birth: Feb 06, 1946 Referring Provider (PT): Lenna Gilford   Encounter Date: 09/24/2021   PT End of Session - 09/24/21 0948     Visit Number 11    Number of Visits 12    Date for PT Re-Evaluation 09/19/21    PT Start Time 0945    PT Stop Time 1026    PT Time Calculation (min) 41 min    Activity Tolerance Patient tolerated treatment well    Behavior During Therapy [facility] for tasks assessed/performed             Past Medical History:  Diagnosis Date   Anxiety    situational anxiety/stress   Arthritis    thumb and fingers   Cataract    bilateral -LEFT sx completed   Fibromyalgia    GERD (gastroesophageal reflux disease)    on meds   Hyperlipidemia    on meds   Hypertension    on meds   IBS (irritable bowel syndrome)    Memory changes    Osteopenia    Peripheral neuropathy    bilateral feet   Ulcer    gastric   Vitamin D insufficiency     Past Surgical History:  Procedure Laterality Date   ABDOMINAL HYSTERECTOMY     uterus frist and 10 years later both ovaries removed   APPENDECTOMY     BLADDER SURGERY     bladder tack   BREAST EXCISIONAL BIOPSY     CATARACT EXTRACTION Left    CHOLECYSTECTOMY     COLONOSCOPY N/A 03/22/2014   Procedure: COLONOSCOPY;  Surgeon: Rogene Houston, MD;  [facility];  Service: Endoscopy;  Laterality: N/A;  930   OOPHORECTOMY     TOE SURGERY Left    2nd toe on RIGHT foot w/pins   WISDOM TOOTH EXTRACTION      There were no vitals filed for this visit.   Subjective Assessment - 09/24/21 0947     Subjective COVID-19 screen performed prior to patient entering clinic.  Patient arrived with no pain just has reported difficulty with steping up with L LE onto steps or curb.    Pertinent History previous  fall    Limitations Walking    How long can you walk comfortably? slight pain (constant); uneven terrain increases pain    Diagnostic tests x-ray: negative    Patient Stated Goals walk her dog, going down stairs, walk without her cane, help with yardwork,    Currently in Pain? No/denies                               [facility] Adult PT Treatment/Exercise - 09/24/21 0001       Knee/Hip Exercises: Aerobic   Nustep Level 4 x 15 minutes.      Knee/Hip Exercises: Machines for Strengthening   Cybex Knee Extension 10# 2x10 reps    Cybex Knee Flexion 30# 2x10 reps      Knee/Hip Exercises: Standing   Other Standing Knee Exercises standing mini wall squat 5sec x10reps      Knee/Hip Exercises: Seated   Long Arc Quad Strengthening;Both;1 set;10 reps   5-10 sec holds with eccentric lowering   Ball Squeeze 20 reps with 5-10 sec hold with feet slight ER  Sit to Sand 1 set;10 reps   glut focus                      PT Short Term Goals - 09/24/21 0949       PT SHORT TERM GOAL #1   Title Patient will report pain at a 7/10 or less with daily activiites such as walking.    Baseline 09/24/21 no pain reported    Time 3    Period Weeks    Status Achieved    Target Date 08/29/21               PT Long Term Goals - 09/24/21 0949       PT LONG TERM GOAL #1   Title Patient will be independent with her home exercise program.    Time 6    Period Weeks    Status Achieved      PT LONG TERM GOAL #2   Title Patient will report being able to walk with left knee pain at a 4/10 or less.    Baseline no pain reported with walking 09/24/21    Time 6    Period Weeks    Status Achieved      PT LONG TERM GOAL #3   Title Patient will be able to safely walk without an assistive device for at least 20 feet.    Time 6    Period Weeks    Status Achieved                   Plan - 09/24/21 1000     Clinical Impression Statement Patient reported no pain and  is able to walk without difficulty. Patient has reported some difficulty with stepping up onto steps. Patient reported some pain in knee only for a short "explosion" of pain that subsides quickly when sitting to a low seat. All current goals met today.    Personal Factors and Comorbidities Time since onset of injury/illness/exacerbation;Past/Current Experience    Examination-Activity Limitations Locomotion Level;Transfers    Examination-Participation Restrictions Church;Cleaning;Community Activity;Yard Work    [employer] Evolving/Moderate complexity    Rehab Potential Fair    PT Frequency 2x / week    PT Duration 6 weeks    PT Treatment/Interventions Neuromuscular re-education;Balance training;Therapeutic exercise;Therapeutic activities;Patient/family education;Manual techniques;Electrical Stimulation;Moist Heat;Vasopneumatic Device    PT Next Visit Plan 1 visit remaining. Review HEP    Consulted and Agree with Plan of Care Patient             Patient will benefit from skilled therapeutic intervention in order to improve the following deficits and impairments:  Difficulty walking, Pain, Decreased activity tolerance, Decreased balance, Decreased strength  Visit Diagnosis: Left knee pain, unspecified chronicity     Problem List Patient Active Problem List   Diagnosis Date Noted   Edema 04/17/2021   Educated about COVID-19 virus infection 10/11/2019   Hypokalemia 08/02/2019   Obesity (BMI 30-39.9) 27/74/1287   Metabolic syndrome 86/76/7209   Slow transit constipation 07/27/2015   Rectocele 10/31/2014   Osteopenia 09/13/2014   Prediabetes 06/23/2014   Trigeminal neuralgia syndrome 02/02/2014   Vitamin D deficiency 02/02/2014   Memory impairment 12/12/2013   GERD (gastroesophageal reflux disease)    Essential hypertension    Irritable bowel syndrome 11/06/2009   Hereditary and idiopathic peripheral neuropathy 02/28/2009   Hyperlipidemia with target LDL  less than 100 02/27/2009   Allergic rhinitis 02/27/2009   Diverticulosis of colon 02/27/2009  Osteoarthritis 02/27/2009   Myalgia and myositis 02/27/2009   PEPTIC ULCER DISEASE, HX OF 02/27/2009    Candra Wegner P, PTA 09/24/2021, 10:26 AM  [facility] [place], [place], [address] Phone: [phone]   Fax:  [phone]  Name: Charlotte Aguirre MRN: [id number] Date of Birth: [birth year]

## 2021-10-01 ENCOUNTER — Ambulatory Visit: Payer: Medicare HMO

## 2021-10-01 ENCOUNTER — Other Ambulatory Visit: Payer: Self-pay

## 2021-10-01 DIAGNOSIS — M25562 Pain in left knee: Secondary | ICD-10-CM | POA: Diagnosis not present

## 2021-10-01 NOTE — Therapy (Signed)
[facility] [place], [place], [address] Phone: [phone]   Fax:  [phone]  Physical Therapy Treatment  Patient Details  Name: Charlotte Aguirre MRN: [id number] Date of Birth: [birth year] Referring Provider (PT): Lenna Gilford   Encounter Date: 10/01/2021   PT End of Session - 10/01/21 4917     Visit Number 12    Number of Visits 12    Date for PT Re-Evaluation 09/19/21    PT Start Time 1301    PT Stop Time 1343    PT Time Calculation (min) 42 min    Activity Tolerance Patient tolerated treatment well    Behavior During Therapy [facility] for tasks assessed/performed             Past Medical History:  Diagnosis Date   Anxiety    situational anxiety/stress   Arthritis    thumb and fingers   Cataract    bilateral -LEFT sx completed   Fibromyalgia    GERD (gastroesophageal reflux disease)    on meds   Hyperlipidemia    on meds   Hypertension    on meds   IBS (irritable bowel syndrome)    Memory changes    Osteopenia    Peripheral neuropathy    bilateral feet   Ulcer    gastric   Vitamin D insufficiency     Past Surgical History:  Procedure Laterality Date   ABDOMINAL HYSTERECTOMY     uterus frist and 10 years later both ovaries removed   APPENDECTOMY     BLADDER SURGERY     bladder tack   BREAST EXCISIONAL BIOPSY     CATARACT EXTRACTION Left    CHOLECYSTECTOMY     COLONOSCOPY N/A 03/22/2014   Procedure: COLONOSCOPY;  Surgeon: Rogene Houston, MD;  [facility];  Service: Endoscopy;  Laterality: N/A;  930   OOPHORECTOMY     TOE SURGERY Left    2nd toe on RIGHT foot w/pins   WISDOM TOOTH EXTRACTION      There were no vitals filed for this visit.   Subjective Assessment - 10/01/21 1303     Subjective COVID-19 screen performed prior to patient entering clinic.  Patient reports that she hurt her left knee around Thanksgiving. She notes that she twisted her knee which almost caused her to fall. She  notes that her HEP helped some. She primarily has pain with walking up and down ramps and twisting.    Pertinent History previous fall    Limitations Walking    How long can you walk comfortably? slight pain (constant); uneven terrain increases pain    Diagnostic tests x-ray: negative    Patient Stated Goals walk her dog, going down stairs, walk without her cane, help with yardwork,    Currently in Pain? No/denies                               [facility] Adult PT Treatment/Exercise - 10/01/21 0001       Knee/Hip Exercises: Stretches   Gastroc Stretch Left;4 reps;30 seconds   standing     Manual Therapy   Manual Therapy Joint mobilization    Joint Mobilization tibiofemoral distraction with PROM and patellar                 Balance Exercises - 10/01/21 0001       Balance Exercises: Standing   Tandem Stance Eyes open;Intermittent upper extremity  support;4 reps;30 secs    Tandem Gait Forward;Retro;Upper extremity support   2 minutes   Sidestepping Upper extremity support   2 minutes                 PT Short Term Goals - 09/24/21 0949       PT SHORT TERM GOAL #1   Title Patient will report pain at a 7/10 or less with daily activiites such as walking.    Baseline 09/24/21 no pain reported    Time 3    Period Weeks    Status Achieved    Target Date 08/29/21               PT Long Term Goals - 09/24/21 0949       PT LONG TERM GOAL #1   Title Patient will be independent with her home exercise program.    Time 6    Period Weeks    Status Achieved      PT LONG TERM GOAL #2   Title Patient will report being able to walk with left knee pain at a 4/10 or less.    Baseline no pain reported with walking 09/24/21    Time 6    Period Weeks    Status Achieved      PT LONG TERM GOAL #3   Title Patient will be able to safely walk without an assistive device for at least 20 feet.    Time 6    Period Weeks    Status Achieved                    Plan - 10/01/21 1306     Clinical Impression Statement Patient presented to treatment reporting an exacerbation in her left knee pain since her last appointment. Manual therapy was able to signficantly reduce her familiar knee pain. She was able to demonstrate excellent progress with skilled physical therapy as she was able to meet her goals for physical therapy. She was provided an updated HEP and she was able to safely and properly demonstrate these interventions. She reported feeling comfortable being discharged today as she is satisfied with her progress.    Personal Factors and Comorbidities Time since onset of injury/illness/exacerbation;Past/Current Experience    Examination-Activity Limitations Locomotion Level;Transfers    Examination-Participation Restrictions Church;Cleaning;Community Activity;Yard Work    [employer] Evolving/Moderate complexity    Rehab Potential Fair    PT Frequency 2x / week    PT Duration 6 weeks    PT Treatment/Interventions Neuromuscular re-education;Balance training;Therapeutic exercise;Therapeutic activities;Patient/family education;Manual techniques;Electrical Stimulation;Moist Heat;Vasopneumatic Device    PT Next Visit Plan 1 visit remaining. Review HEP    Consulted and Agree with Plan of Care Patient             Patient will benefit from skilled therapeutic intervention in order to improve the following deficits and impairments:  Difficulty walking, Pain, Decreased activity tolerance, Decreased balance, Decreased strength  Visit Diagnosis: Left knee pain, unspecified chronicity     Problem List Patient Active Problem List   Diagnosis Date Noted   Edema 04/17/2021   Educated about COVID-19 virus infection 10/11/2019   Hypokalemia 08/02/2019   Obesity (BMI 30-39.9) 97/94/8016   Metabolic syndrome 55/37/4827   Slow transit constipation 07/27/2015   Rectocele 10/31/2014   Osteopenia 09/13/2014    Prediabetes 06/23/2014   Trigeminal neuralgia syndrome 02/02/2014   Vitamin D deficiency 02/02/2014   Memory impairment 12/12/2013   GERD (gastroesophageal reflux disease)  Essential hypertension    Irritable bowel syndrome 11/06/2009   Hereditary and idiopathic peripheral neuropathy 02/28/2009   Hyperlipidemia with target LDL less than 100 02/27/2009   Allergic rhinitis 02/27/2009   Diverticulosis of colon 02/27/2009   Osteoarthritis 02/27/2009   Myalgia and myositis 02/27/2009   PEPTIC ULCER DISEASE, HX OF 02/27/2009    Darlin Coco, PT 10/01/2021, 1:55 PM  [facility] [place], [place], [address] Phone: [phone]   Fax:  [phone]  Name: KATLIN BORTNER MRN: [id number] Date of Birth: [birth year]   PHYSICAL THERAPY DISCHARGE SUMMARY  Visits from Start of Care: 12  Current functional level related to goals / functional outcomes: She was able to meet all of her goal for physical therapy   Remaining deficits: None   Education / Equipment: HEP    Patient agrees to discharge. Patient goals were met. Patient is being discharged due to meeting the stated rehab goals.

## 2021-10-04 ENCOUNTER — Ambulatory Visit (INDEPENDENT_AMBULATORY_CARE_PROVIDER_SITE_OTHER): Payer: Medicare HMO | Admitting: Family

## 2021-10-04 ENCOUNTER — Encounter: Payer: Self-pay | Admitting: Family

## 2021-10-04 VITALS — BP 119/73 | HR 63 | Temp 98.0°F | Ht 62.0 in | Wt 186.0 lb

## 2021-10-04 DIAGNOSIS — K219 Gastro-esophageal reflux disease without esophagitis: Secondary | ICD-10-CM

## 2021-10-04 DIAGNOSIS — E785 Hyperlipidemia, unspecified: Secondary | ICD-10-CM | POA: Diagnosis not present

## 2021-10-04 DIAGNOSIS — M1990 Unspecified osteoarthritis, unspecified site: Secondary | ICD-10-CM

## 2021-10-04 DIAGNOSIS — I10 Essential (primary) hypertension: Secondary | ICD-10-CM | POA: Diagnosis not present

## 2021-10-04 NOTE — Progress Notes (Signed)
Subjective:    Patient ID: Charlotte Aguirre, female    DOB: November 25, 1945, 75 y.o.   MRN: [id number]  Chief Complaint  Patient presents with   Medical Management of Chronic Issues   Pt presents to the office today for chronic follow up. She is followed by GI for GERD, IBS,  diverticulosis, and abdominal pain as needed.    She is followed by Cardiologists for HTN and palpitations. Arthritis Presents for follow-up visit. She complains of pain and stiffness. The symptoms have been stable. Affected locations include the right knee, left knee, right MCP, left MCP, right wrist and left wrist. Her pain is at a severity of 7/10. Associated symptoms include pain at night.  Constipation This is a chronic problem. The current episode started more than 1 year ago. The problem has been waxing and waning since onset. Risk factors include obesity. She has tried laxatives for the symptoms. The treatment provided moderate relief.  Gastroesophageal Reflux She complains of belching and heartburn. This is a chronic problem. The current episode started more than 1 year ago. The problem occurs occasionally. Risk factors include obesity. She has tried a PPI for the symptoms. The treatment provided moderate relief.  Hyperlipidemia This is a chronic problem. The current episode started more than 1 year ago. Exacerbating diseases include obesity. Current antihyperlipidemic treatment includes statins. The current treatment provides moderate improvement of lipids. Risk factors for coronary artery disease include dyslipidemia, hypertension, a sedentary lifestyle and post-menopausal.     Review of Systems  Gastrointestinal:  Positive for constipation and heartburn.  Musculoskeletal:  Positive for arthritis and stiffness.  All other systems reviewed and are negative.     Objective:   Physical Exam Vitals reviewed.  Constitutional:      General: She is not in acute distress.    Appearance: She is well-developed.  HENT:      Head: Normocephalic and atraumatic.     Right Ear: Tympanic membrane normal.     Left Ear: Tympanic membrane normal.  Eyes:     Pupils: Pupils are equal, round, and reactive to light.  Neck:     Thyroid: No thyromegaly.  Cardiovascular:     Rate and Rhythm: Normal rate and regular rhythm.     Heart sounds: Normal heart sounds. No murmur heard. Pulmonary:     Effort: Pulmonary effort is normal. No respiratory distress.     Breath sounds: Normal breath sounds. No wheezing.  Abdominal:     General: Bowel sounds are normal. There is no distension.     Palpations: Abdomen is soft.     Tenderness: There is no abdominal tenderness.  Musculoskeletal:        General: No tenderness. Normal range of motion.     Cervical back: Normal range of motion and neck supple.  Skin:    General: Skin is warm and dry.  Neurological:     Mental Status: She is alert and oriented to person, place, and time.     Cranial Nerves: No cranial nerve deficit.     Deep Tendon Reflexes: Reflexes are normal and symmetric.  Psychiatric:        Behavior: Behavior normal.        Thought Content: Thought content normal.        Judgment: Judgment normal.        BP 119/73   Pulse 63   Temp 98 F (36.7 C) (Temporal)   Ht 5' 2"  (1.575 m)   Wt 186 lb (  84.4 kg)   BMI 34.02 kg/m   Assessment & Plan:  Charlotte Aguirre comes in today with chief complaint of Medical Management of Chronic Issues   Diagnosis and orders addressed:  1. Essential hypertension - CMP14+EGFR - CBC with Differential/Platelet  2. Gastroesophageal reflux disease without esophagitis - CMP14+EGFR - CBC with Differential/Platelet  3. Osteoarthritis, unspecified osteoarthritis type, unspecified site - CMP14+EGFR - CBC with Differential/Platelet  4. Hyperlipidemia with target LDL less than 100 - CMP14+EGFR - CBC with Differential/Platelet - Lipid panel   Labs pending Health Maintenance reviewed Diet and exercise  encouraged  Follow up plan: 6 months    Evelina Dun, FNP

## 2021-10-04 NOTE — Patient Instructions (Signed)
Health Maintenance After Age 77 After age 77, you are at a higher risk for certain long-term diseases and infections as well as injuries from falls. Falls are a major cause of broken bones and head injuries in people who are older than age 77. Getting regular preventive care can help to keep you healthy and well. Preventive care includes getting regular testing and making lifestyle changes as recommended by your health care provider. Talk with your health care provider about: Which screenings and tests you should have. A screening is a test that checks for a disease when you have no symptoms. A diet and exercise plan that is right for you. What should I know about screenings and tests to prevent falls? Screening and testing are the best ways to find a health problem early. Early diagnosis and treatment give you the best chance of managing medical conditions that are common after age 77. Certain conditions and lifestyle choices may make you more likely to have a fall. Your health care provider may recommend: Regular vision checks. Poor vision and conditions such as cataracts can make you more likely to have a fall. If you wear glasses, make sure to get your prescription updated if your vision changes. Medicine review. Work with your health care provider to regularly review all of the medicines you are taking, including over-the-counter medicines. Ask your health care provider about any side effects that may make you more likely to have a fall. Tell your health care provider if any medicines that you take make you feel dizzy or sleepy. Strength and balance checks. Your health care provider may recommend certain tests to check your strength and balance while standing, walking, or changing positions. Foot health exam. Foot pain and numbness, as well as not wearing proper footwear, can make you more likely to have a fall. Screenings, including: Osteoporosis screening. Osteoporosis is a condition that causes  the bones to get weaker and break more easily. Blood pressure screening. Blood pressure changes and medicines to control blood pressure can make you feel dizzy. Depression screening. You may be more likely to have a fall if you have a fear of falling, feel depressed, or feel unable to do activities that you used to do. Alcohol use screening. Using too much alcohol can affect your balance and may make you more likely to have a fall. Follow these instructions at home: Lifestyle Do not drink alcohol if: Your health care provider tells you not to drink. If you drink alcohol: Limit how much you have to: 0-1 drink a day for women. 0-2 drinks a day for men. Know how much alcohol is in your drink. In the [place] one drink equals one 12 oz bottle of beer (355 mL), one 5 oz glass of wine (148 mL), or one 1 oz glass of hard liquor (44 mL). Do not use any products that contain nicotine or tobacco. These products include cigarettes, chewing tobacco, and vaping devices, such as e-cigarettes. If you need help quitting, ask your health care provider. Activity  Follow a regular exercise program to stay fit. This will help you maintain your balance. Ask your health care provider what types of exercise are appropriate for you. If you need a cane or walker, use it as recommended by your health care provider. Wear supportive shoes that have nonskid soles. Safety  Remove any tripping hazards, such as rugs, cords, and clutter. Install safety equipment such as grab bars in bathrooms and safety rails on stairs. Keep rooms and walkways   well-lit. General instructions Talk with your health care provider about your risks for falling. Tell your health care provider if: You fall. Be sure to tell your health care provider about all falls, even ones that seem minor. You feel dizzy, tiredness (fatigue), or off-balance. Take over-the-counter and prescription medicines only as told by your health care provider. These include  supplements. Eat a healthy diet and maintain a healthy weight. A healthy diet includes low-fat dairy products, low-fat (lean) meats, and fiber from whole grains, beans, and lots of fruits and vegetables. Stay current with your vaccines. Schedule regular health, dental, and eye exams. Summary Having a healthy lifestyle and getting preventive care can help to protect your health and wellness after age 77. Screening and testing are the best way to find a health problem early and help you avoid having a fall. Early diagnosis and treatment give you the best chance for managing medical conditions that are more common for people who are older than age 77. Falls are a major cause of broken bones and head injuries in people who are older than age 77. Take precautions to prevent a fall at home. Work with your health care provider to learn what changes you can make to improve your health and wellness and to prevent falls. This information is not intended to replace advice given to you by your health care provider. Make sure you discuss any questions you have with your health care provider. Document Revised: 03/11/2021 Document Reviewed: 03/11/2021 Elsevier Patient Education  2022 [employer]  

## 2021-10-05 LAB — CBC WITH DIFFERENTIAL/PLATELET
Basophils Absolute: 0.1 10*3/uL (ref 0.0–0.2)
Basos: 1 %
EOS (ABSOLUTE): 0.2 10*3/uL (ref 0.0–0.4)
Eos: 3 %
Hematocrit: 43.8 % (ref 34.0–46.6)
Hemoglobin: 14.1 g/dL (ref 11.1–15.9)
Immature Grans (Abs): 0 10*3/uL (ref 0.0–0.1)
Immature Granulocytes: 0 %
Lymphocytes Absolute: 2.7 10*3/uL (ref 0.7–3.1)
Lymphs: 29 %
MCH: 27.1 pg (ref 26.6–33.0)
MCHC: 32.2 g/dL (ref 31.5–35.7)
MCV: 84 fL (ref 79–97)
Monocytes Absolute: 0.5 10*3/uL (ref 0.1–0.9)
Monocytes: 6 %
Neutrophils Absolute: 5.8 10*3/uL (ref 1.4–7.0)
Neutrophils: 61 %
Platelets: 269 10*3/uL (ref 150–450)
RBC: 5.2 x10E6/uL (ref 3.77–5.28)
RDW: 13.5 % (ref 11.7–15.4)
WBC: 9.3 10*3/uL (ref 3.4–10.8)

## 2021-10-05 LAB — CMP14+EGFR
ALT: 11 IU/L (ref 0–32)
AST: 16 IU/L (ref 0–40)
Albumin/Globulin Ratio: 1.7 (ref 1.2–2.2)
Albumin: 4 g/dL (ref 3.7–4.7)
Alkaline Phosphatase: 119 IU/L (ref 44–121)
BUN/Creatinine Ratio: 16 (ref 12–28)
BUN: 14 mg/dL (ref 8–27)
Bilirubin Total: 0.3 mg/dL (ref 0.0–1.2)
CO2: 31 mmol/L — ABNORMAL HIGH (ref 20–29)
Calcium: 9.3 mg/dL (ref 8.7–10.3)
Chloride: 100 mmol/L (ref 96–106)
Creatinine, Ser: 0.85 mg/dL (ref 0.57–1.00)
Globulin, Total: 2.4 g/dL (ref 1.5–4.5)
Glucose: 95 mg/dL (ref 70–99)
Potassium: 3.7 mmol/L (ref 3.5–5.2)
Sodium: 142 mmol/L (ref 134–144)
Total Protein: 6.4 g/dL (ref 6.0–8.5)
eGFR: 71 mL/min/{1.73_m2} (ref >59)

## 2021-10-05 LAB — LIPID PANEL
Chol/HDL Ratio: 3 ratio (ref 0.0–4.4)
Cholesterol, Total: 163 mg/dL (ref 100–199)
HDL: 55 mg/dL (ref >39)
LDL Chol Calc (NIH): 93 mg/dL (ref 0–99)
Triglycerides: 82 mg/dL (ref 0–149)
VLDL Cholesterol Cal: 15 mg/dL (ref 5–40)

## 2021-10-08 NOTE — Progress Notes (Signed)
R/C labs

## 2021-11-11 ENCOUNTER — Encounter: Payer: Self-pay | Admitting: Family

## 2021-11-11 ENCOUNTER — Ambulatory Visit (INDEPENDENT_AMBULATORY_CARE_PROVIDER_SITE_OTHER): Payer: Medicare HMO | Admitting: Family

## 2021-11-11 VITALS — BP 137/66 | HR 67 | Temp 97.4°F | Ht 62.0 in | Wt 189.8 lb

## 2021-11-11 DIAGNOSIS — K219 Gastro-esophageal reflux disease without esophagitis: Secondary | ICD-10-CM

## 2021-11-11 DIAGNOSIS — R6884 Jaw pain: Secondary | ICD-10-CM | POA: Diagnosis not present

## 2021-11-11 DIAGNOSIS — R110 Nausea: Secondary | ICD-10-CM

## 2021-11-11 DIAGNOSIS — R69 Illness, unspecified: Secondary | ICD-10-CM | POA: Diagnosis not present

## 2021-11-11 DIAGNOSIS — F411 Generalized anxiety disorder: Secondary | ICD-10-CM

## 2021-11-11 DIAGNOSIS — G4700 Insomnia, unspecified: Secondary | ICD-10-CM | POA: Diagnosis not present

## 2021-11-11 MED ORDER — ONDANSETRON HCL 4 MG PO TABS: 4.0000 mg | ORAL_TABLET | Freq: Three times a day (TID) | ORAL | 2 refills | Status: DC | PRN

## 2021-11-11 NOTE — Progress Notes (Signed)
Subjective:    Patient ID: Charlotte Aguirre, female    DOB: 01-04-1946, 76 y.o.   MRN: [id number]  Chief Complaint  Patient presents with   Jaw Pain    Left    Nausea    Wants zofran    PT presents to the office today with left jaw pain that starts when she opens her mouth. She reports the pain has improved over the last week, but will flare up. When she lifts her mouth she can feel it popping.   She is also complaining nausea intermittently in the AM. She has used zofran as needed that helps with this. She has GERD and states this is well controlled with her Protonix.  Insomnia Primary symptoms: difficulty falling asleep, frequent awakening.   The current episode started more than one year. The onset quality is gradual. The problem occurs intermittently. The treatment provided mild relief.     Review of Systems  Psychiatric/Behavioral:  The patient has insomnia.   All other systems reviewed and are negative.     Objective:   Physical Exam Vitals reviewed.  Constitutional:      General: She is not in acute distress.    Appearance: She is well-developed.  HENT:     Head: Normocephalic and atraumatic.     Right Ear: Tympanic membrane normal.     Left Ear: Tympanic membrane normal.  Eyes:     Pupils: Pupils are equal, round, and reactive to light.  Neck:     Thyroid: No thyromegaly.  Cardiovascular:     Rate and Rhythm: Normal rate and regular rhythm.     Heart sounds: Normal heart sounds. No murmur heard. Pulmonary:     Effort: Pulmonary effort is normal. No respiratory distress.     Breath sounds: Normal breath sounds. No wheezing.  Abdominal:     General: Bowel sounds are normal. There is no distension.     Palpations: Abdomen is soft.     Tenderness: There is no abdominal tenderness.  Musculoskeletal:        General: No tenderness. Normal range of motion.     Cervical back: Normal range of motion and neck supple.  Skin:    General: Skin is warm and dry.   Neurological:     Mental Status: She is alert and oriented to person, place, and time.     Cranial Nerves: No cranial nerve deficit.     Deep Tendon Reflexes: Reflexes are normal and symmetric.  Psychiatric:        Behavior: Behavior normal.        Thought Content: Thought content normal.        Judgment: Judgment normal.     BP 137/66    Pulse 67    Temp (!) 97.4 F (36.3 C) (Temporal)    Ht 5\' 2"  (1.575 m)    Wt 189 lb 12.8 oz (86.1 kg)    BMI 34.71 kg/m       Assessment & Plan:  Charlotte Aguirre comes in today with chief complaint of Jaw Pain (Left ) and Nausea (Wants zofran )   Diagnosis and orders addressed:  1. Jaw pain Rest Soft diet Can not tolerate NSAID's  Not flared up at this time Stress management   2. Nausea Zofran as needed - ondansetron (ZOFRAN) 4 MG tablet; Take 1 tablet (4 mg total) by mouth every 8 (eight) hours as needed for nausea or vomiting.  Dispense: 20 tablet; Refill: 2  3.  Gastroesophageal reflux disease without esophagitis Continue Protonix  I believe in morning is related to GERD -Diet discussed- Avoid fried, spicy, citrus foods, caffeine and alcohol -Do not eat 2-3 hours before bedtime -Encouraged small frequent meals -Avoid NSAID's  4. Insomnia, unspecified type Will try melatonin first    5. GAD (generalized anxiety disorder) Increased stress related to husband. Stress management      Evelina Dun, FNP

## 2021-11-11 NOTE — Patient Instructions (Signed)
Temporomandibular Joint Syndrome Temporomandibular joint syndrome (TMJ syndrome) is a condition that causes pain in the temporomandibular joints. These joints are located near your ears and allow your jaw to open and close. For people with TMJ syndrome, chewing, biting, or other movements of the jaw can be difficult or painful. TMJ syndrome is often mild and goes away within a few weeks. However, sometimes the condition becomes a long-term (chronic) problem. What are the causes? This condition may be caused by: Grinding your teeth or clenching your jaw. Some people do this when they are stressed. Arthritis. An injury to the jaw. A head or neck injury. Teeth or dentures that are not aligned well. In some cases, the cause of TMJ syndrome may not be known. What are the signs or symptoms? The most common symptom of this condition is aching pain on the side of the head in the area of the TMJ. Other symptoms may include: Pain when moving your jaw, such as when chewing or biting. Not being able to open your jaw all the way. Making a clicking sound when you open your mouth. Headache. Earache. Neck or shoulder pain. How is this diagnosed? This condition may be diagnosed based on: Your symptoms and medical history. A physical exam. Your health care provider may check the range of motion of your jaw. Imaging tests, such as X-rays or an MRI. You may also need to see your dentist, who will check if your teeth and jaw are lined up correctly. How is this treated? TMJ syndrome often goes away on its own. If treatment is needed, it may include: Eating soft foods and applying ice or heat. Medicines to relieve pain or inflammation. Medicines or massage to relax the muscles. A splint, bite plate, or mouthpiece to prevent teeth grinding or jaw clenching. Relaxation techniques or counseling to help reduce stress. A therapy for pain in which an electrical current is applied to the nerves through the skin  (transcutaneous electrical nerve stimulation). Acupuncture. This may help to relieve pain. Jaw surgery. This is rarely needed. Follow these instructions at home: Eating and drinking Eat a soft diet if you are having trouble chewing. Avoid foods that require a lot of chewing. Do not chew gum. General instructions Take over-the-counter and prescription medicines only as told by your health care provider. If directed, put ice on the painful area. To do this: Put ice in a plastic bag. Place a towel between your skin and the bag. Leave the ice on for 20 minutes, 2-3 times a day. Remove the ice if your skin turns bright red. This is very important. If you cannot feel pain, heat, or cold, you have a greater risk of damage to the area. Apply a warm, wet cloth (warm compress) to the painful area as told. Massage your jaw area and do any jaw stretching exercises as told by your health care provider. If you were given a splint, bite plate, or mouthpiece, wear it as told by your health care provider. Keep all follow-up visits. This is important. Where to find more information [place]: [email or web address]/ Contact a health care provider if: You have trouble eating. You have new or worsening symptoms. Get help right away if: Your jaw locks. Summary Temporomandibular joint syndrome (TMJ syndrome) is a condition that causes pain in the temporomandibular joints. These joints are located near your ears and allow your jaw to open and close. TMJ syndrome is often mild and goes away within a few  weeks. However, sometimes the condition becomes a long-term (chronic) problem. Symptoms include an aching pain on the side of the head in the area of the TMJ, pain when chewing or biting, and being unable to open your jaw all the way. You may also make a clicking sound when you open your mouth. TMJ syndrome often goes away on its own. If treatment is needed, it may include  medicines to relieve pain, reduce inflammation, or relax the muscles. A splint, bite plate, or mouthpiece may also be used to prevent teeth grinding or jaw clenching. This information is not intended to replace advice given to you by your health care provider. Make sure you discuss any questions you have with your health care provider. Document Revised: 06/02/2021 Document Reviewed: 06/02/2021 Elsevier Patient Education  [place].

## 2021-11-13 NOTE — Telephone Encounter (Signed)
Will close encounter

## 2021-11-15 ENCOUNTER — Other Ambulatory Visit: Payer: Self-pay | Admitting: Family

## 2021-11-18 ENCOUNTER — Encounter: Payer: Self-pay | Admitting: Cardiology

## 2021-11-18 NOTE — Progress Notes (Signed)
Cardiology Office Note   Date:  11/20/2021   ID:  GERI Aguirre, DOB 01/29/1946, MRN [id number]  PCP:  Sharion Balloon, FNP  Cardiologist:   None Referring:  Sharion Balloon, FNP  Chief Complaint  Patient presents with   Palpitations      History of Present Illness: Charlotte Aguirre is a 76 y.o. female who is referred by Sharion Balloon, FNP for evaluation of palpitations.   She did see Dr. Johnsie Cancel in 2010.   She had palpitations and was treated with beta blocker.  She had a normal echo.  She had no sustained arrhythmia but was noted to have occasional ectopic beats.  She had a perfusion study 14 years ago.  I saw her for this in Sept 2020.  She wore a Holter and she had brief runs of SVT.    I last saw her in 2020.  She returns because she has been having some chest discomfort.  This is an occasional squeezing discomfort.  Side of the left breast.  She had some last night.  It happens at rest.  It is not associate with activities.  She does not describe associated nausea vomiting or diaphoresis.  It is mild to moderate.  There is no radiation to her jaw or her arms.  She is not able to bring this on.  She has some increased shortness of breath but she thinks this is only when she is wearing her mask.  She does not notice it if she is walking outside and not wearing the mask.  She does describe palpitations.  These are somewhat different than her previous.  Actually the last and first episode was last night where he felt like it was a steady more rapid beat that went away with coughing.  She has not had that before.  She is had some other skipping irregular beats evaluated with monitoring before.    Past Medical History:  Diagnosis Date   Anxiety    situational anxiety/stress   Arthritis    thumb and fingers   Cataract    bilateral -LEFT sx completed   Fibromyalgia    GERD (gastroesophageal reflux disease)    on meds   Hyperlipidemia    on meds   Hypertension    on meds   IBS  (irritable bowel syndrome)    Memory changes    Osteopenia    Peripheral neuropathy    bilateral feet   Vitamin D insufficiency     Past Surgical History:  Procedure Laterality Date   ABDOMINAL HYSTERECTOMY     uterus frist and 10 years later both ovaries removed   APPENDECTOMY     BLADDER SURGERY     bladder tack   BREAST EXCISIONAL BIOPSY     CATARACT EXTRACTION Left    CHOLECYSTECTOMY     COLONOSCOPY N/A 03/22/2014   Procedure: COLONOSCOPY;  Surgeon: Rogene Houston, MD;  Location: [facility];  Service: Endoscopy;  Laterality: N/A;  930   OOPHORECTOMY     TOE SURGERY Left    2nd toe on RIGHT foot w/pins   WISDOM TOOTH EXTRACTION       Current Outpatient Medications  Medication Sig Dispense Refill   calcium carbonate (OS-CAL - DOSED IN MG OF ELEMENTAL CALCIUM) 1250 (500 Ca) MG tablet Take 1 tablet by mouth.     Cholecalciferol (VITAMIN D-3 PO) Take 1 tablet by mouth daily. 2000 units     gabapentin (NEURONTIN) 300  MG capsule Take 1 capsule (300 mg total) by mouth 3 (three) times daily. 90 capsule 3   hydrochlorothiazide (HYDRODIURIL) 25 MG tablet Take 1 tablet (25 mg total) by mouth daily. 90 tablet 2   linaclotide (LINZESS) 72 MCG capsule Take 1 capsule (72 mcg total) by mouth daily before breakfast. 90 capsule 2   metoprolol tartrate (LOPRESSOR) 100 MG tablet Take 1 tablet (100 mg total) by mouth once for 1 dose. Take 1 tablet 2 hours before your CT scan 1 tablet 0   Multiple Vitamins-Minerals (PRESERVISION AREDS 2 PO) daily.     ondansetron (ZOFRAN) 4 MG tablet Take 1 tablet (4 mg total) by mouth every 8 (eight) hours as needed for nausea or vomiting. 20 tablet 2   pantoprazole (PROTONIX) 40 MG tablet Take 1 tablet (40 mg total) by mouth daily. 90 tablet 2   propranolol (INDERAL) 10 MG tablet Take 1 tablet (10 mg total) by mouth 3 (three) times daily. 90 tablet 11   simvastatin (ZOCOR) 40 MG tablet Take 1 tablet (40 mg total) by mouth daily. 90 tablet 2   No current  facility-administered medications for this visit.    Allergies:   Aspirin, Bactrim [sulfamethoxazole-trimethoprim], and Topiramate     ROS:  Please see the history of present illness.   Otherwise, review of systems are positive for none.   All other systems are reviewed and negative.    PHYSICAL EXAM: VS:  BP 138/68 (BP Location: Left Arm, Patient Position: Sitting, Cuff Size: Normal)    Pulse 73    Ht 5\' 2"  (1.575 m)    Wt 191 lb (86.6 kg)    SpO2 98%    BMI 34.93 kg/m  , BMI Body mass index is 34.93 kg/m. GENERAL:  Well appearing NECK:  No jugular venous distention, waveform within normal limits, carotid upstroke brisk and symmetric, no bruits, no thyromegaly LUNGS:  Clear to auscultation bilaterally CHEST:  Unremarkable HEART:  PMI not displaced or sustained,S1 and S2 within normal limits, no S3, no S4, no clicks, no rubs, no murmurs ABD:  Flat, positive bowel sounds normal in frequency in pitch, no bruits, no rebound, no guarding, no midline pulsatile mass, no hepatomegaly, no splenomegaly EXT:  2 plus pulses throughout, no edema, no cyanosis no clubbing   EKG:  EKG is  ordered today. Sinus rhythm, rate 73, axis within normal limits, intervals within normal limits, no acute ST-T wave changes.   Recent Labs: 10/04/2021: ALT 11; BUN 14; Creatinine, Ser 0.85; Hemoglobin 14.1; Platelets 269; Potassium 3.7; Sodium 142    Lipid Panel    Component Value Date/Time   CHOL 163 10/04/2021 1306   TRIG 82 10/04/2021 1306   TRIG 217 (H) 10/31/2014 1526   HDL 55 10/04/2021 1306   HDL 38 (L) 10/31/2014 1526   CHOLHDL 3.0 10/04/2021 1306   CHOLHDL 4.9 02/17/2009 0805   VLDL 36 02/17/2009 0805   LDLCALC 93 10/04/2021 1306      Wt Readings from Last 3 Encounters:  11/20/21 191 lb (86.6 kg)  11/11/21 189 lb 12.8 oz (86.1 kg)  10/04/21 186 lb (84.4 kg)      Other studies Reviewed: Additional studies/ records that were reviewed today include: Labs Review of the above records  demonstrates:  Please see elsewhere in the note.     ASSESSMENT AND PLAN:  PALPITATIONS:   I think these are probably some SVT as demonstrated previously.  I am again going to give her a prescription for propranolol  10 mg p.o. every 8 hours as needed although she did not take this in the past.  I do see she had normal electrolytes and thyroid studies.   CHEST DISCOMFORT: She has some chest discomfort and she has moderate pretest probability for obstructive coronary disease.  I am going to send her for coronary CT.  Current medicines are reviewed at length with the patient today.  The patient does not have concerns regarding medicines.  The following changes have been made:  None  Labs/ tests ordered today include:    None   Orders Placed This Encounter  Procedures   CT CORONARY MORPH W/CTA COR W/SCORE W/CA W/CM &/OR WO/CM   Basic metabolic panel   EKG 50-PTWS     Disposition:   FU with me as needed.   Signed, Minus Breeding, MD  11/20/2021 2:17 PM    [facility]

## 2021-11-20 ENCOUNTER — Other Ambulatory Visit: Payer: Self-pay

## 2021-11-20 ENCOUNTER — Other Ambulatory Visit: Payer: Medicare HMO

## 2021-11-20 ENCOUNTER — Ambulatory Visit (INDEPENDENT_AMBULATORY_CARE_PROVIDER_SITE_OTHER): Payer: Medicare HMO | Admitting: Cardiology

## 2021-11-20 ENCOUNTER — Encounter: Payer: Self-pay | Admitting: Cardiology

## 2021-11-20 ENCOUNTER — Other Ambulatory Visit: Payer: Self-pay | Admitting: *Deleted

## 2021-11-20 VITALS — BP 138/68 | HR 73 | Ht 62.0 in | Wt 191.0 lb

## 2021-11-20 DIAGNOSIS — R002 Palpitations: Secondary | ICD-10-CM

## 2021-11-20 DIAGNOSIS — Z01812 Encounter for preprocedural laboratory examination: Secondary | ICD-10-CM

## 2021-11-20 DIAGNOSIS — E876 Hypokalemia: Secondary | ICD-10-CM | POA: Diagnosis not present

## 2021-11-20 DIAGNOSIS — R079 Chest pain, unspecified: Secondary | ICD-10-CM

## 2021-11-20 MED ORDER — PROPRANOLOL HCL 10 MG PO TABS: 10.0000 mg | ORAL_TABLET | Freq: Three times a day (TID) | ORAL | 11 refills | Status: DC

## 2021-11-20 MED ORDER — PROPRANOLOL HCL 10 MG PO TABS: 10.0000 mg | ORAL_TABLET | Freq: Three times a day (TID) | ORAL | 11 refills | Status: DC | PRN

## 2021-11-20 MED ORDER — METOPROLOL TARTRATE 100 MG PO TABS: 100.0000 mg | ORAL_TABLET | Freq: Once | ORAL | 0 refills | Status: DC

## 2021-11-20 NOTE — Patient Instructions (Addendum)
Medication Instructions:  Please start Propranolol 10 mg three times a day as needed. Continue all other medications as listed.  *If you need a refill on your cardiac medications before your next appointment, please call your pharmacy*  Lab Work: Please have blood work (BMP) at [facility].  If you have labs (blood work) drawn today and your tests are completely normal, you will receive your results only by: [place] (if you have MyChart) OR A paper copy in the mail If you have any lab test that is abnormal or we need to change your treatment, we will call you to review the results.   Testing/Procedures:   Your cardiac CT will be scheduled at   [facility] [place], [place] [address] [phone]  Ellis Parents arrive at the [facility] main entrance (entrance A) of [facility] 30 minutes prior to test start time. You can use the FREE valet parking offered at the main entrance (encouraged to control the heart rate for the test) Proceed to the [facility] Radiology Department (first floor) to check-in and test prep.  Please follow these instructions carefully (unless otherwise directed):  On the Night Before the Test: Be sure to Drink plenty of water. Do not consume any caffeinated/decaffeinated beverages or chocolate 12 hours prior to your test. Do not take any antihistamines 12 hours prior to your test.  On the Day of the Test: Drink plenty of water until 1 hour prior to the test. Do not eat any food 4 hours prior to the test. You may take your regular medications prior to the test.  Take metoprolol (Lopressor) two hours prior to test. HOLD Furosemide/Hydrochlorothiazide morning of the test. FEMALES- please wear underwire-free bra if available, avoid dresses & tight clothing  After the Test: Drink plenty of water. After receiving IV contrast, you may experience a mild flushed feeling. This is normal. On occasion, you may experience a mild  rash up to 24 hours after the test. This is not dangerous. If this occurs, you can take Benadryl 25 mg and increase your fluid intake. If you experience trouble breathing, this can be serious. If it is severe call 911 IMMEDIATELY. If it is mild, please call our office. If you take any of these medications: Glipizide/Metformin, Avandament, Glucavance, please do not take 48 hours after completing test unless otherwise instructed.  We will call to schedule your test 2-4 weeks out understanding that some insurance companies will need an authorization prior to the service being performed.   For non-scheduling related questions, please contact the cardiac imaging nurse navigator should you have any questions/concerns: Marchia Bond, Cardiac Imaging Nurse Navigator Gordy Clement, Cardiac Imaging Nurse Navigator [facility] Direct Office Dial: [phone]   For scheduling needs, including cancellations and rescheduling, please call [place], [phone].  Follow-Up: At [facility] you and your health needs are our priority.  As part of our continuing mission to provide you with exceptional heart care, we have created designated Provider Care Teams.  These Care Teams include your primary Cardiologist (physician) and Advanced Practice Providers (APPs -  Physician Assistants and Nurse Practitioners) who all work together to provide you with the care you need, when you need it.  We recommend signing up for the patient portal called "MyChart".  Sign up information is provided on this After Visit Summary.  MyChart is used to connect with patients for Virtual Visits (Telemedicine).  Patients are able to view lab/test results, encounter notes,  upcoming appointments, etc.  Non-urgent messages can be sent to your provider as well.   To learn more about what you can do with MyChart, go to [email or web address].    Your next appointment:   Follow up will be based on the results of  the above testing.  Thank you for choosing [facility]!!

## 2021-11-21 LAB — BASIC METABOLIC PANEL
BUN/Creatinine Ratio: 21 (ref 12–28)
BUN: 16 mg/dL (ref 8–27)
CO2: 24 mmol/L (ref 20–29)
Calcium: 9.3 mg/dL (ref 8.7–10.3)
Chloride: 101 mmol/L (ref 96–106)
Creatinine, Ser: 0.76 mg/dL (ref 0.57–1.00)
Glucose: 106 mg/dL — ABNORMAL HIGH (ref 70–99)
Potassium: 3.3 mmol/L — ABNORMAL LOW (ref 3.5–5.2)
Sodium: 145 mmol/L — ABNORMAL HIGH (ref 134–144)
eGFR: 81 mL/min/{1.73_m2} (ref >59)

## 2021-11-25 ENCOUNTER — Telehealth: Payer: Self-pay | Admitting: *Deleted

## 2021-11-25 DIAGNOSIS — E876 Hypokalemia: Secondary | ICD-10-CM

## 2021-11-25 MED ORDER — POTASSIUM CHLORIDE ER 10 MEQ PO TBCR: 10.0000 meq | EXTENDED_RELEASE_TABLET | Freq: Every day | ORAL | 3 refills | Status: DC

## 2021-11-25 NOTE — Telephone Encounter (Signed)
Spoke with pt, aware of dr hochrein's recommendations. ?

## 2021-11-25 NOTE — Telephone Encounter (Signed)
Spoke with pt, she reports she takes potassium chl ER 10 meq once daily already. Aware will make dr hochrein aware. Medication list updated.

## 2021-11-25 NOTE — Telephone Encounter (Signed)
-----   Message from Minus Breeding, MD sent at 11/24/2021 12:19 PM EST ----- The potassium is low.  Please call in a prescription for 20 meq of KCL daily.  Call Ms. Saltz with the results and send results to Sharion Balloon, FNP

## 2021-11-27 ENCOUNTER — Telehealth (HOSPITAL_COMMUNITY): Payer: Self-pay | Admitting: Emergency Medicine

## 2021-11-27 NOTE — Telephone Encounter (Signed)
Pt sick and r/s CCTA appt. Will call again closer to new appt date to review instructions.  Marchia Bond RN Navigator Cardiac Imaging [facility] [phone] Office  [phone] Cell

## 2021-11-28 ENCOUNTER — Ambulatory Visit (HOSPITAL_COMMUNITY): Admission: RE | Admit: 2021-11-28 | Source: Ambulatory Visit

## 2021-12-09 ENCOUNTER — Telehealth (HOSPITAL_COMMUNITY): Payer: Self-pay | Admitting: *Deleted

## 2021-12-09 NOTE — Telephone Encounter (Signed)
Reaching out to patient to offer assistance regarding upcoming cardiac imaging study; pt verbalizes understanding of appt date/time, parking situation and where to check in, pre-test NPO status and medications ordered, and verified current allergies; name and call back number provided for further questions should they arise  Gordy Clement RN Navigator Cardiac Imaging [facility] [phone] office [phone] cell  Patient to take 50mg  metoprolol tartrate two hours prior to her cardiac CT scan. She is aware to arrive at 11am for her 11:30am scan.

## 2021-12-11 ENCOUNTER — Encounter (HOSPITAL_COMMUNITY): Payer: Self-pay

## 2021-12-11 ENCOUNTER — Other Ambulatory Visit: Payer: Self-pay

## 2021-12-11 ENCOUNTER — Ambulatory Visit (HOSPITAL_COMMUNITY)
Admission: RE | Admit: 2021-12-11 | Discharge: 2021-12-11 | Disposition: A | Discharge status: Home / Self Care | Payer: Medicare HMO | Source: Ambulatory Visit | Attending: Cardiology | Admitting: Cardiology

## 2021-12-11 DIAGNOSIS — R079 Chest pain, unspecified: Secondary | ICD-10-CM | POA: Diagnosis not present

## 2021-12-11 DIAGNOSIS — I2510 Atherosclerotic heart disease of native coronary artery without angina pectoris: Secondary | ICD-10-CM | POA: Diagnosis not present

## 2021-12-11 DIAGNOSIS — I700 Atherosclerosis of aorta: Secondary | ICD-10-CM | POA: Diagnosis not present

## 2021-12-11 MED ORDER — IOHEXOL 350 MG/ML SOLN
95.0000 mL | Freq: Once | INTRAVENOUS | Status: AC | PRN
  Administered 2021-12-11: 95 mL via INTRAVENOUS

## 2021-12-11 MED ORDER — NITROGLYCERIN 0.4 MG SL SUBL
SUBLINGUAL_TABLET | SUBLINGUAL | Status: AC
  Filled 2021-12-11: qty 2

## 2021-12-11 MED ORDER — NITROGLYCERIN 0.4 MG SL SUBL
0.8000 mg | SUBLINGUAL_TABLET | Freq: Once | SUBLINGUAL | Status: AC
  Administered 2021-12-11: 0.8 mg via SUBLINGUAL

## 2021-12-12 ENCOUNTER — Encounter: Payer: Self-pay | Admitting: *Deleted

## 2021-12-15 DIAGNOSIS — S335XXA Sprain of ligaments of lumbar spine, initial encounter: Secondary | ICD-10-CM | POA: Diagnosis not present

## 2021-12-30 DIAGNOSIS — H5021 Vertical strabismus, right eye: Secondary | ICD-10-CM | POA: Diagnosis not present

## 2021-12-30 DIAGNOSIS — H353131 Nonexudative age-related macular degeneration, bilateral, early dry stage: Secondary | ICD-10-CM | POA: Diagnosis not present

## 2021-12-30 DIAGNOSIS — H25811 Combined forms of age-related cataract, right eye: Secondary | ICD-10-CM | POA: Diagnosis not present

## 2021-12-30 DIAGNOSIS — H532 Diplopia: Secondary | ICD-10-CM | POA: Diagnosis not present

## 2021-12-30 DIAGNOSIS — H35372 Puckering of macula, left eye: Secondary | ICD-10-CM | POA: Diagnosis not present

## 2021-12-30 DIAGNOSIS — Z961 Presence of intraocular lens: Secondary | ICD-10-CM | POA: Diagnosis not present

## 2022-01-16 DIAGNOSIS — H2511 Age-related nuclear cataract, right eye: Secondary | ICD-10-CM | POA: Diagnosis not present

## 2022-01-23 HISTORY — PX: CATARACT EXTRACTION: SUR2

## 2022-01-24 ENCOUNTER — Other Ambulatory Visit: Payer: Self-pay | Admitting: Family

## 2022-01-24 DIAGNOSIS — E785 Hyperlipidemia, unspecified: Secondary | ICD-10-CM

## 2022-01-24 DIAGNOSIS — K219 Gastro-esophageal reflux disease without esophagitis: Secondary | ICD-10-CM

## 2022-01-24 DIAGNOSIS — I10 Essential (primary) hypertension: Secondary | ICD-10-CM

## 2022-01-24 DIAGNOSIS — E876 Hypokalemia: Secondary | ICD-10-CM

## 2022-02-11 DIAGNOSIS — Z0100 Encounter for examination of eyes and vision without abnormal findings: Secondary | ICD-10-CM | POA: Diagnosis not present

## 2022-02-13 ENCOUNTER — Encounter: Payer: Self-pay | Admitting: Family Medicine

## 2022-02-13 ENCOUNTER — Ambulatory Visit (INDEPENDENT_AMBULATORY_CARE_PROVIDER_SITE_OTHER): Payer: Medicare HMO | Admitting: Family Medicine

## 2022-02-13 VITALS — BP 132/69 | HR 64 | Temp 97.9°F | Resp 16 | Ht 61.0 in | Wt 193.0 lb

## 2022-02-13 DIAGNOSIS — M25551 Pain in right hip: Secondary | ICD-10-CM

## 2022-02-13 DIAGNOSIS — M25552 Pain in left hip: Secondary | ICD-10-CM

## 2022-02-13 DIAGNOSIS — M160 Bilateral primary osteoarthritis of hip: Secondary | ICD-10-CM

## 2022-02-13 DIAGNOSIS — Z23 Encounter for immunization: Secondary | ICD-10-CM

## 2022-02-13 MED ORDER — KETOROLAC TROMETHAMINE 30 MG/ML IJ SOLN
30.0000 mg | Freq: Once | INTRAMUSCULAR | Status: AC
  Administered 2022-02-13: 30 mg via INTRAMUSCULAR

## 2022-02-13 MED ORDER — PREDNISONE 20 MG PO TABS: 40.0000 mg | ORAL_TABLET | Freq: Every day | ORAL | 0 refills | Status: AC

## 2022-02-13 NOTE — Progress Notes (Signed)
?  ? ?Subjective:  ?Patient ID: Charlotte Aguirre, female    DOB: 26-Sep-1946, 76 y.o.   MRN: [id number] ? ?Patient Care Team: ?Sharion Balloon, FNP as PCP - General (Family Medicine) ?Rogene Houston, MD as Consulting Physician (Gastroenterology) ?Harlen Labs, MD as Referring Physician (Optometry)  ? ?Chief Complaint:  Hip Pain ? ? ?HPI: ?Charlotte Aguirre is a 76 y.o. female presenting on 02/13/2022 for Hip Pain ? ? ?Pt presents today with complaints of bilateral hip pain for several months, right worse than left. States she has significant pain and stiffness after being immobile for long periods of time. States once she gets up and starts walking the pain improves greatly.  ?She had a KUB in 2021 which was reviewed. Revealed degenerative changes of both hips, joint space narrowing, right worse than left. Has not seen ortho for this.  ? ?Hip Pain  ?The incident occurred more than 1 week ago. There was no injury mechanism. The pain is present in the left hip and right hip. The quality of the pain is described as aching (dull). The pain has been Intermittent since onset. Pertinent negatives include no numbness. She reports no foreign bodies present. She has tried acetaminophen and NSAIDs for the symptoms. The treatment provided mild relief.  ?  ? ? ?Relevant past medical, surgical, family, and social history reviewed and updated as indicated.  ?Allergies and medications reviewed and updated. Data reviewed: Chart in Epic. ? ? ?Past Medical History:  ?Diagnosis Date  ? Anxiety   ? situational anxiety/stress  ? Arthritis   ? thumb and fingers  ? Cataract   ? bilateral -LEFT sx completed  ? Fibromyalgia   ? GERD (gastroesophageal reflux disease)   ? on meds  ? Hyperlipidemia   ? on meds  ? Hypertension   ? on meds  ? IBS (irritable bowel syndrome)   ? Memory changes   ? Osteopenia   ? Peripheral neuropathy   ? bilateral feet  ? Vitamin D insufficiency   ? ? ?Past Surgical History:  ?Procedure Laterality Date  ? ABDOMINAL  HYSTERECTOMY    ? uterus frist and 10 years later both ovaries removed  ? APPENDECTOMY    ? BLADDER SURGERY    ? bladder tack  ? BREAST EXCISIONAL BIOPSY    ? CATARACT EXTRACTION Left 01/23/2022  ? CHOLECYSTECTOMY    ? COLONOSCOPY N/A 03/22/2014  ? Procedure: COLONOSCOPY;  Surgeon: Rogene Houston, MD;  [facility];  Service: Endoscopy;  Laterality: N/A;  930  ? OOPHORECTOMY    ? TOE SURGERY Left   ? 2nd toe on RIGHT foot w/pins  ? WISDOM TOOTH EXTRACTION    ? ? ?Social History  ? ?Socioeconomic History  ? Marital status: Married  ?  Spouse name: [name]  ? Number of children: 2  ? Years of education: 6  ? Highest education level: High school graduate  ?Occupational History  ? Occupation: Retired  ?  Employer: [employer]  ?Tobacco Use  ? Smoking status: Former  ?  Packs/day: 1.50  ?  Years: 30.00  ?  Pack years: 45.00  ?  Types: Cigarettes  ?  Quit date: 11/03/1988  ?  Years since quitting: 33.3  ? Smokeless tobacco: Never  ?Vaping Use  ? Vaping Use: Never used  ?Substance and Sexual Activity  ? Alcohol use: No  ? Drug use: No  ? Sexual activity: Yes  ?  Partners: Male  ?Other  Topics Concern  ? Not on file  ?Social History Narrative  ? Retired. Lives with husband [name]. 2 sons, do not live locally. One small dog. She enjoys crocheting and quilting. Is involved in her church.  ? ?Social Determinants of Health  ? ?Financial Resource Strain: Not on file  ?Food Insecurity: Not on file  ?Transportation Needs: Not on file  ?Physical Activity: Not on file  ?Stress: Not on file  ?Social Connections: Not on file  ?Intimate Partner Violence: Not on file  ? ? ?Outpatient Encounter Medications as of 02/13/2022  ?Medication Sig  ? predniSONE (DELTASONE) 20 MG tablet Take 2 tablets (40 mg total) by mouth daily with breakfast for 5 days.  ? calcium carbonate (OS-CAL - DOSED IN MG OF ELEMENTAL CALCIUM) 1250 (500 Ca) MG tablet Take 1 tablet by mouth.  ? Cholecalciferol (VITAMIN D-3 PO) Take 1 tablet by mouth daily.  2000 units  ? gabapentin (NEURONTIN) 300 MG capsule Take 1 capsule (300 mg total) by mouth 3 (three) times daily.  ? hydrochlorothiazide (HYDRODIURIL) 25 MG tablet TAKE 1 TABLET (25 MG TOTAL) BY MOUTH DAILY.  ? linaclotide (LINZESS) 72 MCG capsule Take 1 capsule (72 mcg total) by mouth daily before breakfast.  ? metoprolol tartrate (LOPRESSOR) 100 MG tablet Take 1 tablet (100 mg total) by mouth once for 1 dose. Take 1 tablet 2 hours before your CT scan  ? Multiple Vitamins-Minerals (PRESERVISION AREDS 2 PO) daily.  ? ondansetron (ZOFRAN) 4 MG tablet Take 1 tablet (4 mg total) by mouth every 8 (eight) hours as needed for nausea or vomiting.  ? pantoprazole (PROTONIX) 40 MG tablet TAKE 1 TABLET BY MOUTH EVERY DAY  ? potassium chloride (KLOR-CON) 10 MEQ tablet Take 1 tablet (10 mEq total) by mouth daily.  ? propranolol (INDERAL) 10 MG tablet Take 1 tablet (10 mg total) by mouth 3 (three) times daily as needed.  ? simvastatin (ZOCOR) 40 MG tablet TAKE 1 TABLET BY MOUTH EVERY DAY  ? ?No facility-administered encounter medications on file as of 02/13/2022.  ? ? ?Allergies  ?Allergen Reactions  ? Aspirin   ?  Must take EC, prior history of stomach ulcer. ?Other reaction(s): Angioedema (ALLERGY/intolerance) ?Must take EC, prior history of stomach ulcer.  ? Bactrim [Sulfamethoxazole-Trimethoprim] Nausea And Vomiting  ? Topiramate Other (See Comments)  ?  hallucinations  ? ? ?Review of Systems  ?Constitutional:  Negative for activity change, appetite change, chills, diaphoresis, fatigue, fever and unexpected weight change.  ?HENT:  Negative for congestion, dental problem, drooling, ear discharge, ear pain, facial swelling, hearing loss, mouth sores, nosebleeds, postnasal drip, rhinorrhea, sinus pressure, sinus pain, sneezing, sore throat, tinnitus, trouble swallowing and voice change.   ?Eyes: Negative.  Negative for photophobia and visual disturbance.  ?Respiratory:  Negative for cough, chest tightness and shortness of  breath.   ?Cardiovascular:  Negative for chest pain, palpitations and leg swelling.  ?Gastrointestinal:  Negative for abdominal pain, blood in stool, constipation, diarrhea, nausea and vomiting.  ?Endocrine: Negative.   ?Genitourinary:  Negative for decreased urine volume, difficulty urinating, dysuria, frequency and urgency.  ?Musculoskeletal:  Positive for arthralgias, gait problem and myalgias.  ?Skin: Negative.   ?Allergic/Immunologic: Negative.   ?Neurological:  Negative for dizziness, tremors, seizures, syncope, facial asymmetry, speech difficulty, weakness, light-headedness, numbness and headaches.  ?Hematological: Negative.   ?Psychiatric/Behavioral:  Negative for confusion, hallucinations, sleep disturbance and suicidal ideas.   ?All other systems reviewed and are negative. ? ?   ? ?Objective:  ?BP 132/69  Pulse 64   Temp 97.9 ?F (36.6 ?C)   Resp 16   Ht '5\' 1"'$  (1.549 m)   Wt 193 lb (87.5 kg)   SpO2 95%   BMI 36.47 kg/m?   ? ?Wt Readings from Last 3 Encounters:  ?02/13/22 193 lb (87.5 kg)  ?11/20/21 191 lb (86.6 kg)  ?11/11/21 189 lb 12.8 oz (86.1 kg)  ? ? ?Physical Exam ?Vitals and nursing note reviewed.  ?Constitutional:   ?   General: She is not in acute distress. ?   Appearance: Normal appearance. She is well-developed and well-groomed. She is obese. She is not ill-appearing, toxic-appearing or diaphoretic.  ?HENT:  ?   Head: Normocephalic and atraumatic.  ?   Jaw: There is normal jaw occlusion.  ?   Right Ear: Hearing, ear canal and external ear normal.  ?   Left Ear: Hearing, ear canal and external ear normal.  ?   Nose: Nose normal.  ?   Mouth/Throat:  ?   Lips: Pink.  ?   Mouth: Mucous membranes are moist.  ?   Pharynx: Oropharynx is clear. Uvula midline.  ?Eyes:  ?   General: Lids are normal.  ?   Conjunctiva/sclera: Conjunctivae normal.  ?   Pupils: Pupils are equal, round, and reactive to light.  ?Neck:  ?   Thyroid: No thyroid mass, thyromegaly or thyroid tenderness.  ?   Vascular: No  carotid bruit or JVD.  ?   Trachea: Trachea and phonation normal.  ?Cardiovascular:  ?   Rate and Rhythm: Normal rate and regular rhythm.  ?   Chest Wall: PMI is not displaced.  ?   Pulses: Normal pulses.  ?   Heart sounds:

## 2022-02-19 ENCOUNTER — Ambulatory Visit: Payer: Medicare HMO | Attending: Family Medicine | Admitting: Physical Therapy

## 2022-02-19 DIAGNOSIS — M160 Bilateral primary osteoarthritis of hip: Secondary | ICD-10-CM | POA: Diagnosis not present

## 2022-02-19 DIAGNOSIS — M25562 Pain in left knee: Secondary | ICD-10-CM | POA: Diagnosis not present

## 2022-02-19 DIAGNOSIS — M25552 Pain in left hip: Secondary | ICD-10-CM | POA: Insufficient documentation

## 2022-02-19 DIAGNOSIS — M25551 Pain in right hip: Secondary | ICD-10-CM | POA: Insufficient documentation

## 2022-02-19 NOTE — Therapy (Signed)
[place] ?[facility] ?[address] ?[place], [place], [address] ?Phone: [phone]   Fax:  [phone] ? ?Physical Therapy Evaluation ? ?Patient Details  ?Name: Charlotte Aguirre ?MRN: [id number] ?Date of Birth: 08/29/1946 ?Referring Provider (PT): Darla Lesches ? ? ?Encounter Date: 02/19/2022 ? ? PT End of Session - 02/19/22 1406   ? ? Visit Number 1   ? Number of Visits 8   ? Date for PT Re-Evaluation 03/19/22   ? Authorization Type FOTO AT LEAST EVERY 5TH VISIT.  PROGRESS NOTE AT 10TH VISIT.  KX MODIFIER AFTER 15 VISITS.   ? PT Start Time 0100   ? PT Stop Time 7867   ? PT Time Calculation (min) 58 min   ? Activity Tolerance Patient tolerated treatment well   ? Behavior During Therapy [facility] for tasks assessed/performed   ? ?  ?  ? ?  ? ? ?Past Medical History:  ?Diagnosis Date  ? Anxiety   ? situational anxiety/stress  ? Arthritis   ? thumb and fingers  ? Cataract   ? bilateral -LEFT sx completed  ? Fibromyalgia   ? GERD (gastroesophageal reflux disease)   ? on meds  ? Hyperlipidemia   ? on meds  ? Hypertension   ? on meds  ? IBS (irritable bowel syndrome)   ? Memory changes   ? Osteopenia   ? Peripheral neuropathy   ? bilateral feet  ? Vitamin D insufficiency   ? ? ?Past Surgical History:  ?Procedure Laterality Date  ? ABDOMINAL HYSTERECTOMY    ? uterus frist and 10 years later both ovaries removed  ? APPENDECTOMY    ? BLADDER SURGERY    ? bladder tack  ? BREAST EXCISIONAL BIOPSY    ? CATARACT EXTRACTION Left 01/23/2022  ? CHOLECYSTECTOMY    ? COLONOSCOPY N/A 03/22/2014  ? Procedure: COLONOSCOPY;  Surgeon: Rogene Houston, MD;  [facility];  Service: Endoscopy;  Laterality: N/A;  930  ? OOPHORECTOMY    ? TOE SURGERY Left   ? 2nd toe on RIGHT foot w/pins  ? WISDOM TOOTH EXTRACTION    ? ? ?There were no vitals filed for this visit. ? ? ? Subjective Assessment - 02/19/22 1407   ? ? Subjective The patient presents to the clinic today with c/o worsening bilateral hip pain over the  last several months.  Her pain at rest today is a low 2/10.  However, after she sits for a prolonged period of time and stands her pain is quite severe. After she walks a short distance her pain drops for a short time. She states she use to walk a lot but is now limited to about 1/10th a mile due to pain.  Performing ADL's have also been increasing difficult.  Her pain prohibits her from sleeping comfortable on either side.  There are times her pain is severe.   ? Pertinent History OA, GERD, Fibromylagia, HTN, osteopenia, bladder surgery.   ? How long can you sit comfortably? Transitioning from sit to stand is very painful.   ? How long can you stand comfortably? Varies....but up to 45 minutes.   ? How long can you walk comfortably? 1/10th of a mile.   ? Diagnostic tests X-rays.   ? Patient Stated Goals Decrease pain and do more.  Avoid surgery.   ? Currently in Pain? Yes   ? Pain Score 2    ? Pain Location Hip   ? Pain Orientation Right;Left   ?  Pain Descriptors / Indicators Aching;Sore;Shooting   ? Pain Type Chronic pain   ? Pain Onset More than a month ago   ? Pain Frequency Constant   ? Aggravating Factors  See above.   ? Pain Relieving Factors Resting on back with legs elevated.   ? ?  ?  ? ?  ? ? ? ? ? [facility] PT Assessment - 02/19/22 0001   ? ?  ? Assessment  ? Medical Diagnosis Bilateral hip pain.   ? Referring Provider (PT) Darla Lesches   ? Onset Date/Surgical Date --   "Several months."  ?  ? Precautions  ? Precautions --   Pain-free ther ex.  ?  ? Restrictions  ? Weight Bearing Restrictions No   ?  ? Balance Screen  ? Has the patient fallen in the past 6 months No   ? Has the patient had a decrease in activity level because of a fear of falling?  No   ? Is the patient reluctant to leave their home because of a fear of falling?  No   ?  ? Home Environment  ? Living Environment Private residence   ?  ? Prior Function  ? Level of Independence Independent   ?  ? Observation/Other Assessments  ? Focus on  Therapeutic Outcomes (FOTO)  Complete.   ?  ? Deep Tendon Reflexes  ? DTR Assessment Site Patella;Achilles   ? Patella DTR 2+   ? Achilles DTR 2+   ?  ? ROM / Strength  ? AROM / PROM / Strength AROM;Strength   ?  ? AROM  ? Overall AROM Comments In supine:  Bilateral hip flexion is 115 degrees though right side is painful.  Bilateral IR is 45 degrees and ER is normal.   ?  ? Strength  ? Overall Strength Comments Left hip flexion and abduction is 4+/5, right hip flexion and abduction is 4 to 4+/5.   ?  ? Palpation  ? Palpation comment Tender to palpation over left greater trochanter.  Right side only midly tender.   ?  ? Special Tests  ? Other special tests Equal leg lengths.   ?  ? Ambulation/Gait  ? Gait Comments Antalgic gait pattern.   ? ?  ?  ? ?  ? ? ? ? ? ? ? ? ? ? ? ? ? ?Objective measurements completed on examination: See above findings.  ? ? ? ? ? [place] Adult PT Treatment/Exercise - 02/19/22 0001   ? ?  ? Modalities  ? Modalities Electrical Stimulation   ?  ? Electrical Stimulation  ? Electrical Stimulation Location Left lateral hip.   ? Electrical Stimulation Action IFC at 80-150 Hz.   ? Electrical Stimulation Parameters 40% scan x 20 minutes.   ? Electrical Stimulation Goals Pain   ? ?  ?  ? ?  ? ? ? ? ? ? ? ? ? ? ? ? ? ? ? PT Long Term Goals - 02/19/22 1430   ? ?  ? PT LONG TERM GOAL #1  ? Title Patient will be independent with her home exercise program.   ? Time 4   ? Period Weeks   ? Status New   ?  ? PT LONG TERM GOAL #2  ? Title Patient will report being able to walk a 1/4 with bilateral  pain at a 3-4/10 or less.   ? Time 4   ? Period Weeks   ? Status New   ?  ?  PT LONG TERM GOAL #3  ? Title Bilateral hip abduction to 5/5 to increase stability for functional tasks.   ? Time 4   ? Period Weeks   ? Status New   ?  ? PT LONG TERM GOAL #4  ? Title Perform ADL's with bilateral hip pain  not > 3-4/10.   ? Time 4   ? Period Weeks   ? Status New   ?  ? PT LONG TERM GOAL #5  ? Title Sleep undisturbed 6 hours.    ? Time 4   ? Period Weeks   ? Status New   ? ?  ?  ? ?  ? ? ? ? ? ? ? ? ? Plan - 02/19/22 1423   ? ? Clinical Impression Statement The patient presents to [facility] with progressively worsening bilateral hip pain over the last 6 months.  She was found to be tender to palpation over her left greater trochanter.  Her bilateral hip flexion is equal but painful into flexion on the right .  She had no pain reproduction with IR/ER.  Transitions from sit to stand or very painful.  Her gait is antalgic.  Her ability to perform ADL's are very limited due to pain.  She is unable to sleep comfortably on either hip.  Patient will benefit from skilled physical therapy intervention to address pain and deficits.   ? Personal Factors and Comorbidities Comorbidity 1;Other   ? Comorbidities OA, GERD, Fibromylagia, HTN, osteopenia, bladder surgery.   ? Examination-Activity Limitations Other;Transfers;Locomotion Level;Stand;Sit;Sleep   ? Examination-Participation Restrictions Other;Meal Prep;Cleaning   ? Stability/Clinical Decision Making Evolving/Moderate complexity   ? Clinical Decision Making Low   ? Rehab Potential --   Good minus.  ? PT Frequency 2x / week   ? PT Duration 4 weeks   ? PT Treatment/Interventions ADLs/Self Care Home Management;Cryotherapy;Electrical Stimulation;Ultrasound;Moist Heat;Gait training;Stair training;Functional mobility training;Therapeutic activities;Therapeutic exercise;Manual techniques;Patient/family education;Passive range of motion;Dry needling   ? PT Next Visit Plan Nustep/recumbent bike, bilateral hip abduction strengthening, STW/M and modalities as needed to left greater trochanter area.  Pain-free ther ex.   ? Consulted and Agree with Plan of Care Patient   ? ?  ?  ? ?  ? ? ?Patient will benefit from skilled therapeutic intervention in order to improve the following deficits and impairments:  Abnormal gait, Difficulty walking, Decreased activity tolerance, Decreased mobility, Decreased strength,  Pain ? ?Visit Diagnosis: ?Pain in right hip - Plan: PT plan of care cert/re-cert ? ?Pain in left hip - Plan: PT plan of care cert/re-cert ? ? ? ? ?Problem List ?Patient Active Problem List  ? Diagnosis Date Noted

## 2022-02-21 ENCOUNTER — Ambulatory Visit: Payer: Medicare HMO

## 2022-02-21 DIAGNOSIS — M25552 Pain in left hip: Secondary | ICD-10-CM | POA: Diagnosis not present

## 2022-02-21 DIAGNOSIS — M160 Bilateral primary osteoarthritis of hip: Secondary | ICD-10-CM | POA: Diagnosis not present

## 2022-02-21 DIAGNOSIS — M25551 Pain in right hip: Secondary | ICD-10-CM

## 2022-02-21 DIAGNOSIS — M25562 Pain in left knee: Secondary | ICD-10-CM | POA: Diagnosis not present

## 2022-02-21 NOTE — Therapy (Signed)
?[facility] ?[address] ?[place], [place], [address] ?Phone: [phone]   Fax:  [phone] ? ?Physical Therapy Treatment ? ?Patient Details  ?Name: Charlotte Aguirre ?MRN: [id number] ?Date of Birth: 1946/04/06 ?Referring Provider (PT): Darla Lesches ? ? ?Encounter Date: 02/21/2022 ? ? PT End of Session - 02/21/22 1034   ? ? Visit Number 2   ? Number of Visits 8   ? Date for PT Re-Evaluation 03/19/22   ? Authorization Type FOTO AT LEAST EVERY 5TH VISIT.  PROGRESS NOTE AT 10TH VISIT.  KX MODIFIER AFTER 15 VISITS.   ? PT Start Time 1029   ? PT Stop Time 1125   ? PT Time Calculation (min) 56 min   ? Activity Tolerance Patient tolerated treatment well   ? Behavior During Therapy [facility] for tasks assessed/performed   ? ?  ?  ? ?  ? ? ?Past Medical History:  ?Diagnosis Date  ? Anxiety   ? situational anxiety/stress  ? Arthritis   ? thumb and fingers  ? Cataract   ? bilateral -LEFT sx completed  ? Fibromyalgia   ? GERD (gastroesophageal reflux disease)   ? on meds  ? Hyperlipidemia   ? on meds  ? Hypertension   ? on meds  ? IBS (irritable bowel syndrome)   ? Memory changes   ? Osteopenia   ? Peripheral neuropathy   ? bilateral feet  ? Vitamin D insufficiency   ? ? ?Past Surgical History:  ?Procedure Laterality Date  ? ABDOMINAL HYSTERECTOMY    ? uterus frist and 10 years later both ovaries removed  ? APPENDECTOMY    ? BLADDER SURGERY    ? bladder tack  ? BREAST EXCISIONAL BIOPSY    ? CATARACT EXTRACTION Left 01/23/2022  ? CHOLECYSTECTOMY    ? COLONOSCOPY N/A 03/22/2014  ? Procedure: COLONOSCOPY;  Surgeon: Rogene Houston, MD;  [facility];  Service: Endoscopy;  Laterality: N/A;  930  ? OOPHORECTOMY    ? TOE SURGERY Left   ? 2nd toe on RIGHT foot w/pins  ? WISDOM TOOTH EXTRACTION    ? ? ?There were no vitals filed for this visit. ? ? Subjective Assessment - 02/21/22 1030   ? ? Subjective Patient reports that she felt better after her evaluation until she got back into her car to  go home. She noted that walking this morning caused her left hip to hurt more than her right. She also felt an "explosion" of pain across her back twice after her last appointment.   ? Pertinent History OA, GERD, Fibromylagia, HTN, osteopenia, bladder surgery.   ? How long can you sit comfortably? Transitioning from sit to stand is very painful.   ? How long can you stand comfortably? Varies....but up to 45 minutes.   ? How long can you walk comfortably? 1/10th of a mile.   ? Diagnostic tests X-rays.   ? Patient Stated Goals Decrease pain and do more.  Avoid surgery.   ? Currently in Pain? No/denies   ? Pain Onset More than a month ago   ? ?  ?  ? ?  ? ? ? ? ? ? ? ? ? ? ? ? ? ? ? ? ? ? ? ? [place] Adult PT Treatment/Exercise - 02/21/22 0001   ? ?  ? Exercises  ? Exercises Knee/Hip   ?  ? Knee/Hip Exercises: Aerobic  ? Nustep L3 x 15 minutes   ?  ? Knee/Hip  Exercises: Standing  ? Hip Abduction Both;20 reps;Knee straight   ? Hip Extension Both;20 reps;Knee straight   ?  ? Modalities  ? Modalities Electrical Stimulation   ?  ? Electrical Stimulation  ? Electrical Stimulation Location Left lateral hip.   no redness or adverse reaction to this modality  ? Electrical Stimulation Action pre mod   ? Electrical Stimulation Parameters 80-150 Hz x 15 minutes   ? Electrical Stimulation Goals Pain   ?  ? Manual Therapy  ? Manual Therapy Soft tissue mobilization   ? Soft tissue mobilization left IT band and TFL   ? ?  ?  ? ?  ? ? ? ? ? ? ? ? ? ? ? ? ? ? ? PT Long Term Goals - 02/19/22 1430   ? ?  ? PT LONG TERM GOAL #1  ? Title Patient will be independent with her home exercise program.   ? Time 4   ? Period Weeks   ? Status New   ?  ? PT LONG TERM GOAL #2  ? Title Patient will report being able to walk a 1/4 with bilateral  pain at a 3-4/10 or less.   ? Time 4   ? Period Weeks   ? Status New   ?  ? PT LONG TERM GOAL #3  ? Title Bilateral hip abduction to 5/5 to increase stability for functional tasks.   ? Time 4   ? Period Weeks    ? Status New   ?  ? PT LONG TERM GOAL #4  ? Title Perform ADL's with bilateral hip pain  not > 3-4/10.   ? Time 4   ? Period Weeks   ? Status New   ?  ? PT LONG TERM GOAL #5  ? Title Sleep undisturbed 6 hours.   ? Time 4   ? Period Weeks   ? Status New   ? ?  ?  ? ?  ? ? ? ? ? ? ? ? Plan - 02/21/22 1035   ? ? Clinical Impression Statement Patient was introduced to standing hip abduction and extension. However, this increased her contralateral hip pain due to standing on the single LE. Manual therapy focused on soft tissue mobilization to the left IT band and TFL which was was able to moderately reduce her familiar pain. She reported feeling good upon the conclusion of treatment. She continues to require skilled physical therapy to address her remaining impairments to maximize her functional mobility.   ? Personal Factors and Comorbidities Comorbidity 1;Other   ? Comorbidities OA, GERD, Fibromylagia, HTN, osteopenia, bladder surgery.   ? Examination-Activity Limitations Other;Transfers;Locomotion Level;Stand;Sit;Sleep   ? Examination-Participation Restrictions Other;Meal Prep;Cleaning   ? Stability/Clinical Decision Making Evolving/Moderate complexity   ? Rehab Potential --   Good minus.  ? PT Frequency 2x / week   ? PT Duration 4 weeks   ? PT Treatment/Interventions ADLs/Self Care Home Management;Cryotherapy;Electrical Stimulation;Ultrasound;Moist Heat;Gait training;Stair training;Functional mobility training;Therapeutic activities;Therapeutic exercise;Manual techniques;Patient/family education;Passive range of motion;Dry needling   ? PT Next Visit Plan Nustep/recumbent bike, bilateral hip abduction strengthening, STW/M and modalities as needed to left greater trochanter area.  Pain-free ther ex.   ? Consulted and Agree with Plan of Care Patient   ? ?  ?  ? ?  ? ? ?Patient will benefit from skilled therapeutic intervention in order to improve the following deficits and impairments:  Abnormal gait, Difficulty  walking, Decreased activity tolerance, Decreased mobility, Decreased strength, Pain ? ?  Visit Diagnosis: ?Pain in right hip ? ?Pain in left hip ? ? ? ? ?Problem List ?Patient Active Problem List  ? Diagnosis Date Noted  ? Edema 04/17/2021  ? Educated about COVID-19 virus infection 10/11/2019  ? Hypokalemia 08/02/2019  ? Obesity (BMI 30-39.9) 04/15/2016  ? Metabolic syndrome 14/48/1856  ? Slow transit constipation 07/27/2015  ? Rectocele 10/31/2014  ? Osteopenia 09/13/2014  ? Prediabetes 06/23/2014  ? Trigeminal neuralgia syndrome 02/02/2014  ? Vitamin D deficiency 02/02/2014  ? Memory impairment 12/12/2013  ? GERD (gastroesophageal reflux disease)   ? Essential hypertension   ? Irritable bowel syndrome 11/06/2009  ? Hereditary and idiopathic peripheral neuropathy 02/28/2009  ? Hyperlipidemia with target LDL less than 100 02/27/2009  ? Allergic rhinitis 02/27/2009  ? Diverticulosis of colon 02/27/2009  ? Osteoarthritis 02/27/2009  ? Myalgia and myositis 02/27/2009  ? PEPTIC ULCER DISEASE, HX OF 02/27/2009  ? ? ?Darlin Coco, PT ?02/21/2022, 12:35 PM ? ?[place] ?[facility] ?[address] ?[place], [place], [address] ?Phone: [phone]   Fax:  [phone] ? ?Name: Sharlynn Seckinger Prien ?MRN: [id number] ?Date of Birth: 25-Nov-1945 ? ? ? ?

## 2022-02-24 ENCOUNTER — Encounter: Payer: Self-pay | Admitting: Physical Therapy

## 2022-02-24 ENCOUNTER — Ambulatory Visit: Payer: Medicare HMO | Admitting: Physical Therapy

## 2022-02-24 DIAGNOSIS — M25551 Pain in right hip: Secondary | ICD-10-CM

## 2022-02-24 DIAGNOSIS — M25552 Pain in left hip: Secondary | ICD-10-CM | POA: Diagnosis not present

## 2022-02-24 DIAGNOSIS — M25562 Pain in left knee: Secondary | ICD-10-CM | POA: Diagnosis not present

## 2022-02-24 DIAGNOSIS — M160 Bilateral primary osteoarthritis of hip: Secondary | ICD-10-CM | POA: Diagnosis not present

## 2022-02-24 NOTE — Therapy (Signed)
[place] ?[facility] ?[place] ?[place], [place], [address] ?Phone: [phone]   Fax:  [phone] ? ?Physical Therapy Treatment ? ?Patient Details  ?Name: Charlotte Aguirre ?MRN: [id number] ?Date of Birth: [birth year] ?Referring Provider (PT): Darla Lesches ? ? ?Encounter Date: 02/24/2022 ? ? PT End of Session - 02/24/22 1033   ? ? Visit Number 3   ? Number of Visits 8   ? Date for PT Re-Evaluation 03/19/22   ? Authorization Type FOTO AT LEAST EVERY 5TH VISIT.  PROGRESS NOTE AT 10TH VISIT.  KX MODIFIER AFTER 15 VISITS.   ? PT Start Time 1030   ? PT Stop Time 1116   ? PT Time Calculation (min) 46 min   ? Activity Tolerance Patient tolerated treatment well   ? Behavior During Therapy [facility] for tasks assessed/performed   ? ?  ?  ? ?  ? ? ?Past Medical History:  ?Diagnosis Date  ? Anxiety   ? situational anxiety/stress  ? Arthritis   ? thumb and fingers  ? Cataract   ? bilateral -LEFT sx completed  ? Fibromyalgia   ? GERD (gastroesophageal reflux disease)   ? on meds  ? Hyperlipidemia   ? on meds  ? Hypertension   ? on meds  ? IBS (irritable bowel syndrome)   ? Memory changes   ? Osteopenia   ? Peripheral neuropathy   ? bilateral feet  ? Vitamin D insufficiency   ? ? ?Past Surgical History:  ?Procedure Laterality Date  ? ABDOMINAL HYSTERECTOMY    ? uterus frist and 10 years later both ovaries removed  ? APPENDECTOMY    ? BLADDER SURGERY    ? bladder tack  ? BREAST EXCISIONAL BIOPSY    ? CATARACT EXTRACTION Left 01/23/2022  ? CHOLECYSTECTOMY    ? COLONOSCOPY N/A 03/22/2014  ? Procedure: COLONOSCOPY;  Surgeon: Rogene Houston, MD;  [facility];  Service: Endoscopy;  Laterality: N/A;  930  ? OOPHORECTOMY    ? TOE SURGERY Left   ? 2nd toe on RIGHT foot w/pins  ? WISDOM TOOTH EXTRACTION    ? ? ?There were no vitals filed for this visit. ? ? Subjective Assessment - 02/24/22 1028   ? ? Subjective Reports feeling nauceous which she has felt after cataract surgery. Has been on Prednisone  and helped her hips some. Had groin pain yesterday.   ? Pertinent History OA, GERD, Fibromylagia, HTN, osteopenia, bladder surgery.   ? How long can you sit comfortably? Transitioning from sit to stand is very painful.   ? How long can you stand comfortably? Varies....but up to 45 minutes.   ? How long can you walk comfortably? 1/10th of a mile.   ? Diagnostic tests X-rays.   ? Patient Stated Goals Decrease pain and do more.  Avoid surgery.   ? Currently in Pain? No/denies   ? ?  ?  ? ?  ? ? ? ? ? [facility] PT Assessment - 02/24/22 0001   ? ?  ? Assessment  ? Medical Diagnosis Bilateral hip pain.   ? Referring Provider (PT) Darla Lesches   ?  ? Restrictions  ? Weight Bearing Restrictions No   ? ?  ?  ? ?  ? ? ? ? ? ? ? ? ? ? ? ? ? ? ? ? [place] Adult PT Treatment/Exercise - 02/24/22 0001   ? ?  ? Knee/Hip Exercises: Aerobic  ? Nustep L3 x 16 minutes   ?  ?  Knee/Hip Exercises: Standing  ? Hip Flexion AROM;Both;20 reps;Knee bent   ? Hip Abduction Both;20 reps;Knee straight   ? Hip Extension Both;20 reps;Knee straight   ?  ? Knee/Hip Exercises: Seated  ? Long Arc Quad Strengthening;Both;10 reps;3 sets;Weights   ? Long Arc Quad Weight 4 lbs.   ? [employer] x20 reps   ? Clamshell with TheraBand Red   x20 reps  ? Hamstring Curl Strengthening;Both;20 reps;Limitations   ? Hamstring Limitations red theraband   ? Sit to Sand 10 reps;with UE support   limited by knee pain  ? ?  ?  ? ?  ? ? ? ? ? ? ? ? ? ? ? ? ? ? ? PT Long Term Goals - 02/19/22 1430   ? ?  ? PT LONG TERM GOAL #1  ? Title Patient will be independent with her home exercise program.   ? Time 4   ? Period Weeks   ? Status New   ?  ? PT LONG TERM GOAL #2  ? Title Patient will report being able to walk a 1/4 with bilateral  pain at a 3-4/10 or less.   ? Time 4   ? Period Weeks   ? Status New   ?  ? PT LONG TERM GOAL #3  ? Title Bilateral hip abduction to 5/5 to increase stability for functional tasks.   ? Time 4   ? Period Weeks   ? Status New   ?  ? PT LONG TERM GOAL #4  ?  Title Perform ADL's with bilateral hip pain  not > 3-4/10.   ? Time 4   ? Period Weeks   ? Status New   ?  ? PT LONG TERM GOAL #5  ? Title Sleep undisturbed 6 hours.   ? Time 4   ? Period Weeks   ? Status New   ? ?  ?  ? ?  ? ? ? ? ? ? ? ? Plan - 02/24/22 1207   ? ? Clinical Impression Statement Patient presented in clinic with reports of more L knee pain today but began having R groin pain last night. Patient able to tolerate therex well but SL WBing more uncomfortable due to hip pain. Patient had no complaints with seated exercises but eventually limited with sit <> stands due to L knee pain.   ? Personal Factors and Comorbidities Comorbidity 1;Other   ? Comorbidities OA, GERD, Fibromylagia, HTN, osteopenia, bladder surgery.   ? Examination-Activity Limitations Other;Transfers;Locomotion Level;Stand;Sit;Sleep   ? Examination-Participation Restrictions Other;Meal Prep;Cleaning   ? Stability/Clinical Decision Making Evolving/Moderate complexity   ? PT Frequency 2x / week   ? PT Duration 4 weeks   ? PT Treatment/Interventions ADLs/Self Care Home Management;Cryotherapy;Electrical Stimulation;Ultrasound;Moist Heat;Gait training;Stair training;Functional mobility training;Therapeutic activities;Therapeutic exercise;Manual techniques;Patient/family education;Passive range of motion;Dry needling   ? PT Next Visit Plan Nustep/recumbent bike, bilateral hip abduction strengthening, STW/M and modalities as needed to left greater trochanter area.  Pain-free ther ex.   ? Consulted and Agree with Plan of Care Patient   ? ?  ?  ? ?  ? ? ?Patient will benefit from skilled therapeutic intervention in order to improve the following deficits and impairments:  Abnormal gait, Difficulty walking, Decreased activity tolerance, Decreased mobility, Decreased strength, Pain ? ?Visit Diagnosis: ?Pain in right hip ? ?Pain in left hip ? ? ? ? ?Problem List ?Patient Active Problem List  ? Diagnosis Date Noted  ? Edema 04/17/2021  ? Educated  about COVID-19 virus infection 10/11/2019  ? Hypokalemia 08/02/2019  ? Obesity (BMI 30-39.9) 04/15/2016  ? Metabolic syndrome 50/35/4656  ? Slow transit constipation 07/27/2015  ? Rectocele 10/31/2014  ? Osteopenia 09/13/2014  ? Prediabetes 06/23/2014  ? Trigeminal neuralgia syndrome 02/02/2014  ? Vitamin D deficiency 02/02/2014  ? Memory impairment 12/12/2013  ? GERD (gastroesophageal reflux disease)   ? Essential hypertension   ? Irritable bowel syndrome 11/06/2009  ? Hereditary and idiopathic peripheral neuropathy 02/28/2009  ? Hyperlipidemia with target LDL less than 100 02/27/2009  ? Allergic rhinitis 02/27/2009  ? Diverticulosis of colon 02/27/2009  ? Osteoarthritis 02/27/2009  ? Myalgia and myositis 02/27/2009  ? PEPTIC ULCER DISEASE, HX OF 02/27/2009  ? ? ?Standley Brooking, PTA ?02/24/2022, 12:09 PM ? ? ?[facility] ?[place] ?[place], [place], [address] ?Phone: [phone]   Fax:  [phone] ? ?Name: Maysen Bonsignore Rishel ?MRN: [id number] ?Date of Birth: 12/05/1945 ? ? ? ?

## 2022-02-26 ENCOUNTER — Ambulatory Visit: Payer: Medicare HMO | Admitting: Physical Therapy

## 2022-02-26 DIAGNOSIS — M25551 Pain in right hip: Secondary | ICD-10-CM

## 2022-02-26 DIAGNOSIS — M25562 Pain in left knee: Secondary | ICD-10-CM | POA: Diagnosis not present

## 2022-02-26 DIAGNOSIS — M25552 Pain in left hip: Secondary | ICD-10-CM | POA: Diagnosis not present

## 2022-02-26 DIAGNOSIS — M160 Bilateral primary osteoarthritis of hip: Secondary | ICD-10-CM | POA: Diagnosis not present

## 2022-02-26 NOTE — Therapy (Signed)
[place] ?[facility] ?[place] ?[place], [place], [address] ?Phone: [phone]   Fax:  [phone] ? ?Physical Therapy Treatment ? ?Patient Details  ?Name: Charlotte Aguirre ?MRN: [id number] ?Date of Birth: 23-Aug-1946 ?Referring Provider (PT): Darla Lesches ? ? ?Encounter Date: 02/26/2022 ? ? PT End of Session - 02/26/22 1427   ? ? Visit Number 4   ? Number of Visits 8   ? Date for PT Re-Evaluation 03/19/22   ? Authorization Type FOTO AT LEAST EVERY 5TH VISIT.  PROGRESS NOTE AT 10TH VISIT.  KX MODIFIER AFTER 15 VISITS.   ? PT Start Time 0145   ? PT Stop Time 0241   ? PT Time Calculation (min) 56 min   ? Activity Tolerance Patient tolerated treatment well   ? Behavior During Therapy [facility] for tasks assessed/performed   ? ?  ?  ? ?  ? ? ?Past Medical History:  ?Diagnosis Date  ? Anxiety   ? situational anxiety/stress  ? Arthritis   ? thumb and fingers  ? Cataract   ? bilateral -LEFT sx completed  ? Fibromyalgia   ? GERD (gastroesophageal reflux disease)   ? on meds  ? Hyperlipidemia   ? on meds  ? Hypertension   ? on meds  ? IBS (irritable bowel syndrome)   ? Memory changes   ? Osteopenia   ? Peripheral neuropathy   ? bilateral feet  ? Vitamin D insufficiency   ? ? ?Past Surgical History:  ?Procedure Laterality Date  ? ABDOMINAL HYSTERECTOMY    ? uterus frist and 10 years later both ovaries removed  ? APPENDECTOMY    ? BLADDER SURGERY    ? bladder tack  ? BREAST EXCISIONAL BIOPSY    ? CATARACT EXTRACTION Left 01/23/2022  ? CHOLECYSTECTOMY    ? COLONOSCOPY N/A 03/22/2014  ? Procedure: COLONOSCOPY;  Surgeon: Rogene Houston, MD;  [facility];  Service: Endoscopy;  Laterality: N/A;  930  ? OOPHORECTOMY    ? TOE SURGERY Left   ? 2nd toe on RIGHT foot w/pins  ? WISDOM TOOTH EXTRACTION    ? ? ?There were no vitals filed for this visit. ? ? Subjective Assessment - 02/26/22 1427   ? ? Subjective Feel a little better but may be from the Prednisone.  Been having increased groin pain.   ?  Pertinent History OA, GERD, Fibromylagia, HTN, osteopenia, bladder surgery.   ? How long can you sit comfortably? Transitioning from sit to stand is very painful.   ? How long can you stand comfortably? Varies....but up to 45 minutes.   ? How long can you walk comfortably? 1/10th of a mile.   ? Diagnostic tests X-rays.   ? Patient Stated Goals Decrease pain and do more.  Avoid surgery.   ? Currently in Pain? Yes   ? Pain Score 2    ? Pain Location Hip   ? Pain Orientation Left   ? Pain Descriptors / Indicators Aching   ? Pain Type Chronic pain   ? Pain Onset More than a month ago   ? ?  ?  ? ?  ? ? ? ? ? ? ? ? ? ? ? ? ? ? ? ? ? ? ? ? [place] Adult PT Treatment/Exercise - 02/26/22 0001   ? ?  ? Exercises  ? Exercises Knee/Hip   ?  ? Knee/Hip Exercises: Aerobic  ? Nustep Level 4 x 10 minutes.   ?  ?  Modalities  ? Modalities Electrical Stimulation;Ultrasound   ?  ? Electrical Stimulation  ? Electrical Stimulation Location Left lateral hip.   ? Electrical Stimulation Action IFC at 80-150 Hz.   ? Electrical Stimulation Parameters 40% scan x 20 minutes.   ? Electrical Stimulation Goals Pain   ?  ? Ultrasound  ? Ultrasound Location Right sdly position with folded pillow between knees for comfort:  Combo e'stim/US at 1.50 W/CM2 x 8 minutes.   ?  ? Manual Therapy  ? Manual Therapy Soft tissue mobilization   ? Soft tissue mobilization STW/M x 6 minutes to patient's left lateral hip musculature and greater trochanter region.   ? ?  ?  ? ?  ? ? ? ? ? ? ? ? ? ? ? ? ? ? ? PT Long Term Goals - 02/19/22 1430   ? ?  ? PT LONG TERM GOAL #1  ? Title Patient will be independent with her home exercise program.   ? Time 4   ? Period Weeks   ? Status New   ?  ? PT LONG TERM GOAL #2  ? Title Patient will report being able to walk a 1/4 with bilateral  pain at a 3-4/10 or less.   ? Time 4   ? Period Weeks   ? Status New   ?  ? PT LONG TERM GOAL #3  ? Title Bilateral hip abduction to 5/5 to increase stability for functional tasks.   ? Time 4    ? Period Weeks   ? Status New   ?  ? PT LONG TERM GOAL #4  ? Title Perform ADL's with bilateral hip pain  not > 3-4/10.   ? Time 4   ? Period Weeks   ? Status New   ?  ? PT LONG TERM GOAL #5  ? Title Sleep undisturbed 6 hours.   ? Time 4   ? Period Weeks   ? Status New   ? ?  ?  ? ?  ? ? ? ? ? ? ? ? Plan - 02/26/22 1438   ? ? Clinical Impression Statement Patient doing okay today.  She was less palpably tender over her left greater trochanter today.  She found soft tissue work helpful.  Normal modality response following removal of modality.   ? Personal Factors and Comorbidities Comorbidity 1;Other   ? Comorbidities OA, GERD, Fibromylagia, HTN, osteopenia, bladder surgery.   ? Examination-Activity Limitations Other;Transfers;Locomotion Level;Stand;Sit;Sleep   ? Examination-Participation Restrictions Other;Meal Prep;Cleaning   ? Stability/Clinical Decision Making Evolving/Moderate complexity   ? PT Frequency 2x / week   ? PT Duration 4 weeks   ? PT Treatment/Interventions ADLs/Self Care Home Management;Cryotherapy;Electrical Stimulation;Ultrasound;Moist Heat;Gait training;Stair training;Functional mobility training;Therapeutic activities;Therapeutic exercise;Manual techniques;Patient/family education;Passive range of motion;Dry needling   ? PT Next Visit Plan Nustep/recumbent bike, bilateral hip abduction strengthening, STW/M and modalities as needed to left greater trochanter area.  Pain-free ther ex.   ? Consulted and Agree with Plan of Care Patient   ? ?  ?  ? ?  ? ? ?Patient will benefit from skilled therapeutic intervention in order to improve the following deficits and impairments:  Abnormal gait, Difficulty walking, Decreased activity tolerance, Decreased mobility, Decreased strength, Pain ? ?Visit Diagnosis: ?Pain in right hip ? ?Pain in left hip ? ?Left knee pain, unspecified chronicity ? ? ? ? ?Problem List ?Patient Active Problem List  ? Diagnosis Date Noted  ? Edema 04/17/2021  ? Educated about COVID-19  virus infection 10/11/2019  ? Hypokalemia 08/02/2019  ? Obesity (BMI 30-39.9) 04/15/2016  ? Metabolic syndrome 48/25/0037  ? Slow transit constipation 07/27/2015  ? Rectocele 10/31/2014  ? Osteopenia 09/13/2014  ? Prediabetes 06/23/2014  ? Trigeminal neuralgia syndrome 02/02/2014  ? Vitamin D deficiency 02/02/2014  ? Memory impairment 12/12/2013  ? GERD (gastroesophageal reflux disease)   ? Essential hypertension   ? Irritable bowel syndrome 11/06/2009  ? Hereditary and idiopathic peripheral neuropathy 02/28/2009  ? Hyperlipidemia with target LDL less than 100 02/27/2009  ? Allergic rhinitis 02/27/2009  ? Diverticulosis of colon 02/27/2009  ? Osteoarthritis 02/27/2009  ? Myalgia and myositis 02/27/2009  ? PEPTIC ULCER DISEASE, HX OF 02/27/2009  ? ? ?Sanjana Folz, [place], PT ?02/26/2022, 2:52 PM ? ?[place] ?[facility] ?[place] ?[place], [place], [address] ?Phone: [phone]   Fax:  [phone] ? ?Name: Charlotte Aguirre ?MRN: [id number] ?Date of Birth: 07/16/1946 ? ? ? ?

## 2022-02-27 ENCOUNTER — Encounter (INDEPENDENT_AMBULATORY_CARE_PROVIDER_SITE_OTHER): Payer: Self-pay

## 2022-03-03 ENCOUNTER — Encounter: Payer: Self-pay | Admitting: Physical Therapy

## 2022-03-03 ENCOUNTER — Ambulatory Visit: Payer: Medicare HMO | Attending: Family Medicine | Admitting: Physical Therapy

## 2022-03-03 ENCOUNTER — Other Ambulatory Visit: Payer: Self-pay | Admitting: Family

## 2022-03-03 DIAGNOSIS — M25551 Pain in right hip: Secondary | ICD-10-CM

## 2022-03-03 DIAGNOSIS — E876 Hypokalemia: Secondary | ICD-10-CM

## 2022-03-03 DIAGNOSIS — G609 Hereditary and idiopathic neuropathy, unspecified: Secondary | ICD-10-CM

## 2022-03-03 DIAGNOSIS — M25552 Pain in left hip: Secondary | ICD-10-CM | POA: Diagnosis not present

## 2022-03-03 NOTE — Therapy (Signed)
[place] ?[facility] ?[place] ?[place], [place], [address] ?Phone: [phone]   Fax:  [phone] ? ?Physical Therapy Treatment ? ?Patient Details  ?Name: Charlotte Aguirre ?MRN: [id number] ?Date of Birth: 1946-02-07 ?Referring Provider (PT): Darla Lesches ? ? ?Encounter Date: 03/03/2022 ? ? PT End of Session - 03/03/22 1042   ? ? Visit Number 5   ? Number of Visits 8   ? Date for PT Re-Evaluation 03/19/22   ? Authorization Type FOTO AT LEAST EVERY 5TH VISIT.  PROGRESS NOTE AT 10TH VISIT.  KX MODIFIER AFTER 15 VISITS.   ? PT Start Time 1031   ? PT Stop Time 1104   requested to leave early  ? PT Time Calculation (min) 33 min   ? Activity Tolerance Patient tolerated treatment well   ? Behavior During Therapy [facility] for tasks assessed/performed   ? ?  ?  ? ?  ? ? ?Past Medical History:  ?Diagnosis Date  ? Anxiety   ? situational anxiety/stress  ? Arthritis   ? thumb and fingers  ? Cataract   ? bilateral -LEFT sx completed  ? Fibromyalgia   ? GERD (gastroesophageal reflux disease)   ? on meds  ? Hyperlipidemia   ? on meds  ? Hypertension   ? on meds  ? IBS (irritable bowel syndrome)   ? Memory changes   ? Osteopenia   ? Peripheral neuropathy   ? bilateral feet  ? Vitamin D insufficiency   ? ? ?Past Surgical History:  ?Procedure Laterality Date  ? ABDOMINAL HYSTERECTOMY    ? uterus frist and 10 years later both ovaries removed  ? APPENDECTOMY    ? BLADDER SURGERY    ? bladder tack  ? BREAST EXCISIONAL BIOPSY    ? CATARACT EXTRACTION Left 01/23/2022  ? CHOLECYSTECTOMY    ? COLONOSCOPY N/A 03/22/2014  ? Procedure: COLONOSCOPY;  Surgeon: Rogene Houston, MD;  [facility];  Service: Endoscopy;  Laterality: N/A;  930  ? OOPHORECTOMY    ? TOE SURGERY Left   ? 2nd toe on RIGHT foot w/pins  ? WISDOM TOOTH EXTRACTION    ? ? ?There were no vitals filed for this visit. ? ? Subjective Assessment - 03/03/22 1019   ? ? Subjective No pain today. Has been off the Prednisone. walked her dog more  this weekend.   ? Pertinent History OA, GERD, Fibromylagia, HTN, osteopenia, bladder surgery.   ? How long can you sit comfortably? Transitioning from sit to stand is very painful.   ? How long can you stand comfortably? Varies....but up to 45 minutes.   ? How long can you walk comfortably? 1/10th of a mile.   ? Diagnostic tests X-rays.   ? Patient Stated Goals Decrease pain and do more.  Avoid surgery.   ? Currently in Pain? No/denies   ? ?  ?  ? ?  ? ? ? ? ? [facility] PT Assessment - 03/03/22 0001   ? ?  ? Assessment  ? Medical Diagnosis Bilateral hip pain.   ? Referring Provider (PT) Darla Lesches   ?  ? Restrictions  ? Weight Bearing Restrictions No   ? ?  ?  ? ?  ? ? ? ? ? ? ? ? ? ? ? ? ? ? ? ? [place] Adult PT Treatment/Exercise - 03/03/22 0001   ? ?  ? Knee/Hip Exercises: Aerobic  ? Nustep Level 4 x 11 minutes.   ?  ?  Knee/Hip Exercises: Standing  ? Hip Flexion Stengthening;Both;20 reps;Knee bent   ? Hip Flexion Limitations 3#   ? Hip Abduction Stengthening;Both;20 reps;Knee straight   ? Abduction Limitations 3#   ? Forward Step Up Left;20 reps;Hand Hold: 2;Step Height: 6"   ?  ? Knee/Hip Exercises: Seated  ? Long [employer] Strengthening;Both;15 reps;Weights   ? Long Arc Quad Weight 4 lbs.   ? Clamshell with TheraBand Red   x20 reps  ? Hamstring Curl Strengthening;Both;15 reps;Limitations   ? Hamstring Limitations red theraband   ? ?  ?  ? ?  ? ? ? ? ? ? ? ? ? ? ? ? ? ? ? PT Long Term Goals - 02/19/22 1430   ? ?  ? PT LONG TERM GOAL #1  ? Title Patient will be independent with her home exercise program.   ? Time 4   ? Period Weeks   ? Status New   ?  ? PT LONG TERM GOAL #2  ? Title Patient will report being able to walk a 1/4 with bilateral  pain at a 3-4/10 or less.   ? Time 4   ? Period Weeks   ? Status New   ?  ? PT LONG TERM GOAL #3  ? Title Bilateral hip abduction to 5/5 to increase stability for functional tasks.   ? Time 4   ? Period Weeks   ? Status New   ?  ? PT LONG TERM GOAL #4  ? Title Perform ADL's with  bilateral hip pain  not > 3-4/10.   ? Time 4   ? Period Weeks   ? Status New   ?  ? PT LONG TERM GOAL #5  ? Title Sleep undisturbed 6 hours.   ? Time 4   ? Period Weeks   ? Status New   ? ?  ?  ? ?  ? ? ? ? ? ? ? ? Plan - 03/03/22 1121   ? ? Clinical Impression Statement Patient presented in clinic with no complaints of pain but requested to leave early due to her husband. Patient progressed to more resistance with standing hip strengthening but limited by L forward step ups due to chronic L knee pain. Patient able to tolerate therex well but ready to leave early.   ? Personal Factors and Comorbidities Comorbidity 1;Other   ? Comorbidities OA, GERD, Fibromylagia, HTN, osteopenia, bladder surgery.   ? Examination-Activity Limitations Other;Transfers;Locomotion Level;Stand;Sit;Sleep   ? Examination-Participation Restrictions Other;Meal Prep;Cleaning   ? Stability/Clinical Decision Making Evolving/Moderate complexity   ? PT Frequency 2x / week   ? PT Duration 4 weeks   ? PT Treatment/Interventions ADLs/Self Care Home Management;Cryotherapy;Electrical Stimulation;Ultrasound;Moist Heat;Gait training;Stair training;Functional mobility training;Therapeutic activities;Therapeutic exercise;Manual techniques;Patient/family education;Passive range of motion;Dry needling   ? PT Next Visit Plan Nustep/recumbent bike, bilateral hip abduction strengthening, STW/M and modalities as needed to left greater trochanter area.  Pain-free ther ex.   ? Consulted and Agree with Plan of Care Patient   ? ?  ?  ? ?  ? ? ?Patient will benefit from skilled therapeutic intervention in order to improve the following deficits and impairments:  Abnormal gait, Difficulty walking, Decreased activity tolerance, Decreased mobility, Decreased strength, Pain ? ?Visit Diagnosis: ?Pain in right hip ? ?Pain in left hip ? ? ? ? ?Problem List ?Patient Active Problem List  ? Diagnosis Date Noted  ? Edema 04/17/2021  ? Educated about COVID-19 virus infection  10/11/2019  ? Hypokalemia 08/02/2019  ?  Obesity (BMI 30-39.9) 04/15/2016  ? Metabolic syndrome 11/94/1740  ? Slow transit constipation 07/27/2015  ? Rectocele 10/31/2014  ? Osteopenia 09/13/2014  ? Prediabetes 06/23/2014  ? Trigeminal neuralgia syndrome 02/02/2014  ? Vitamin D deficiency 02/02/2014  ? Memory impairment 12/12/2013  ? GERD (gastroesophageal reflux disease)   ? Essential hypertension   ? Irritable bowel syndrome 11/06/2009  ? Hereditary and idiopathic peripheral neuropathy 02/28/2009  ? Hyperlipidemia with target LDL less than 100 02/27/2009  ? Allergic rhinitis 02/27/2009  ? Diverticulosis of colon 02/27/2009  ? Osteoarthritis 02/27/2009  ? Myalgia and myositis 02/27/2009  ? PEPTIC ULCER DISEASE, HX OF 02/27/2009  ? ? ?Standley Brooking, PTA ?03/03/2022, 12:12 PM ? ?[place] ?[facility] ?[place] ?[place], [place], [address] ?Phone: [phone]   Fax:  [phone] ? ?Name: Charlotte Aguirre ?MRN: [id number] ?Date of Birth: [birth year] ? ? ? ?

## 2022-03-05 ENCOUNTER — Encounter: Payer: Self-pay | Admitting: Physical Therapy

## 2022-03-05 ENCOUNTER — Ambulatory Visit: Payer: Medicare HMO | Admitting: Physical Therapy

## 2022-03-05 DIAGNOSIS — M25551 Pain in right hip: Secondary | ICD-10-CM | POA: Diagnosis not present

## 2022-03-05 DIAGNOSIS — M25552 Pain in left hip: Secondary | ICD-10-CM

## 2022-03-05 NOTE — Therapy (Signed)
[place] ?[facility] ?[address] ?[place], [place], [address] ?Phone: [phone]   Fax:  [phone] ? ?Physical Therapy Treatment ? ?Patient Details  ?Name: Charlotte Aguirre ?MRN: [id number] ?Date of Birth: 03-Nov-1946 ?Referring Provider (PT): Darla Lesches ? ? ?Encounter Date: 03/05/2022 ? ? PT End of Session - 03/05/22 1354   ? ? Visit Number 6   ? Number of Visits 8   ? Date for PT Re-Evaluation 03/19/22   ? Authorization Type FOTO AT LEAST EVERY 5TH VISIT.  PROGRESS NOTE AT 10TH VISIT.  KX MODIFIER AFTER 15 VISITS.   ? PT Start Time 2505   ? PT Stop Time 1425   ? PT Time Calculation (min) 36 min   ? Activity Tolerance Patient tolerated treatment well;Patient limited by pain   ? Behavior During Therapy [facility] for tasks assessed/performed   ? ?  ?  ? ?  ? ? ?Past Medical History:  ?Diagnosis Date  ? Anxiety   ? situational anxiety/stress  ? Arthritis   ? thumb and fingers  ? Cataract   ? bilateral -LEFT sx completed  ? Fibromyalgia   ? GERD (gastroesophageal reflux disease)   ? on meds  ? Hyperlipidemia   ? on meds  ? Hypertension   ? on meds  ? IBS (irritable bowel syndrome)   ? Memory changes   ? Osteopenia   ? Peripheral neuropathy   ? bilateral feet  ? Vitamin D insufficiency   ? ? ?Past Surgical History:  ?Procedure Laterality Date  ? ABDOMINAL HYSTERECTOMY    ? uterus frist and 10 years later both ovaries removed  ? APPENDECTOMY    ? BLADDER SURGERY    ? bladder tack  ? BREAST EXCISIONAL BIOPSY    ? CATARACT EXTRACTION Left 01/23/2022  ? CHOLECYSTECTOMY    ? COLONOSCOPY N/A 03/22/2014  ? Procedure: COLONOSCOPY;  Surgeon: Rogene Houston, MD;  [facility];  Service: Endoscopy;  Laterality: N/A;  930  ? OOPHORECTOMY    ? TOE SURGERY Left   ? 2nd toe on RIGHT foot w/pins  ? WISDOM TOOTH EXTRACTION    ? ? ?There were no vitals filed for this visit. ? ? Subjective Assessment - 03/05/22 1352   ? ? Subjective No hip pain today.   ? Pertinent History OA, GERD, Fibromylagia, HTN,  osteopenia, bladder surgery.   ? How long can you sit comfortably? Transitioning from sit to stand is very painful.   ? How long can you stand comfortably? Varies....but up to 45 minutes.   ? How long can you walk comfortably? 1/10th of a mile.   ? Diagnostic tests X-rays.   ? Patient Stated Goals Decrease pain and do more.  Avoid surgery.   ? Currently in Pain? No/denies   ? ?  ?  ? ?  ? ? ? ? ? [facility] PT Assessment - 03/05/22 0001   ? ?  ? Assessment  ? Medical Diagnosis Bilateral hip pain.   ? Referring Provider (PT) Darla Lesches   ? ?  ?  ? ?  ? ? ? ? ? ? ? ? ? ? ? ? ? ? ? ? [place] Adult PT Treatment/Exercise - 03/05/22 0001   ? ?  ? Knee/Hip Exercises: Aerobic  ? Nustep Level 3 x 12 minutes.   ?  ? Knee/Hip Exercises: Standing  ? Hip Flexion Stengthening;Both;15 reps;Knee bent   ? Hip Flexion Limitations 3#   ? Hip Abduction Stengthening;Both;15 reps;Knee  straight   ? Abduction Limitations 3#   ?  ? Knee/Hip Exercises: Seated  ? Long [employer] Strengthening;Both;20 reps;Weights   ? Long Arc Quad Weight 4 lbs.   ? [employer] x20 reps   ? Clamshell with TheraBand Red   x20 reps  ? Marching Strengthening;Both;15 reps;Limitations   ? Marching Limitations red theraband   ? Hamstring Curl Strengthening;Both;20 reps;Limitations   ? Hamstring Limitations red theraband   ? Sit to Sand 10 reps;with UE support   red theraband for abductor  ? ?  ?  ? ?  ? ? ? ? ? ? ? ? ? ? ? ? ? ? ? PT Long Term Goals - 02/19/22 1430   ? ?  ? PT LONG TERM GOAL #1  ? Title Patient will be independent with her home exercise program.   ? Time 4   ? Period Weeks   ? Status New   ?  ? PT LONG TERM GOAL #2  ? Title Patient will report being able to walk a 1/4 with bilateral  pain at a 3-4/10 or less.   ? Time 4   ? Period Weeks   ? Status New   ?  ? PT LONG TERM GOAL #3  ? Title Bilateral hip abduction to 5/5 to increase stability for functional tasks.   ? Time 4   ? Period Weeks   ? Status New   ?  ? PT LONG TERM GOAL #4  ? Title Perform ADL's with  bilateral hip pain  not > 3-4/10.   ? Time 4   ? Period Weeks   ? Status New   ?  ? PT LONG TERM GOAL #5  ? Title Sleep undisturbed 6 hours.   ? Time 4   ? Period Weeks   ? Status New   ? ?  ?  ? ?  ? ? ? ? ? ? ? ? Plan - 03/05/22 1443   ? ? Clinical Impression Statement Patient presented in clinic with reports of continued hip pain as well as L knee pain. Patient reporting more pain from sit > stand repeatedly due to lateral hip pain. Patient limited with resistive exercises due to discomfort intermittantly.   ? Personal Factors and Comorbidities Comorbidity 1;Other   ? Comorbidities OA, GERD, Fibromylagia, HTN, osteopenia, bladder surgery.   ? Examination-Activity Limitations Other;Transfers;Locomotion Level;Stand;Sit;Sleep   ? Examination-Participation Restrictions Other;Meal Prep;Cleaning   ? Stability/Clinical Decision Making Evolving/Moderate complexity   ? PT Frequency 2x / week   ? PT Duration 4 weeks   ? PT Treatment/Interventions ADLs/Self Care Home Management;Cryotherapy;Electrical Stimulation;Ultrasound;Moist Heat;Gait training;Stair training;Functional mobility training;Therapeutic activities;Therapeutic exercise;Manual techniques;Patient/family education;Passive range of motion;Dry needling   ? PT Next Visit Plan Nustep/recumbent bike, bilateral hip abduction strengthening, STW/M and modalities as needed to left greater trochanter area.  Pain-free ther ex.   ? Consulted and Agree with Plan of Care Patient   ? ?  ?  ? ?  ? ? ?Patient will benefit from skilled therapeutic intervention in order to improve the following deficits and impairments:  Abnormal gait, Difficulty walking, Decreased activity tolerance, Decreased mobility, Decreased strength, Pain ? ?Visit Diagnosis: ?Pain in right hip ? ?Pain in left hip ? ? ? ? ?Problem List ?Patient Active Problem List  ? Diagnosis Date Noted  ? Edema 04/17/2021  ? Educated about COVID-19 virus infection 10/11/2019  ? Hypokalemia 08/02/2019  ? Obesity (BMI  30-39.9) 04/15/2016  ? Metabolic syndrome 50/93/2671  ?  Slow transit constipation 07/27/2015  ? Rectocele 10/31/2014  ? Osteopenia 09/13/2014  ? Prediabetes 06/23/2014  ? Trigeminal neuralgia syndrome 02/02/2014  ? Vitamin D deficiency 02/02/2014  ? Memory impairment 12/12/2013  ? GERD (gastroesophageal reflux disease)   ? Essential hypertension   ? Irritable bowel syndrome 11/06/2009  ? Hereditary and idiopathic peripheral neuropathy 02/28/2009  ? Hyperlipidemia with target LDL less than 100 02/27/2009  ? Allergic rhinitis 02/27/2009  ? Diverticulosis of colon 02/27/2009  ? Osteoarthritis 02/27/2009  ? Myalgia and myositis 02/27/2009  ? PEPTIC ULCER DISEASE, HX OF 02/27/2009  ? ? ?Standley Brooking, PTA ?03/05/2022, 2:54 PM ? ?[place] ?[facility] ?[address] ?[place], [place], [address] ?Phone: [phone]   Fax:  [phone] ? ?Name: Charlotte Aguirre ?MRN: [id number] ?Date of Birth: 1946-02-09 ? ? ? ?

## 2022-03-06 ENCOUNTER — Encounter (INDEPENDENT_AMBULATORY_CARE_PROVIDER_SITE_OTHER): Admitting: Ophthalmology

## 2022-03-10 ENCOUNTER — Ambulatory Visit (INDEPENDENT_AMBULATORY_CARE_PROVIDER_SITE_OTHER): Payer: Medicare HMO | Admitting: Ophthalmology

## 2022-03-10 ENCOUNTER — Encounter (INDEPENDENT_AMBULATORY_CARE_PROVIDER_SITE_OTHER): Payer: Self-pay | Admitting: Ophthalmology

## 2022-03-10 ENCOUNTER — Encounter (INDEPENDENT_AMBULATORY_CARE_PROVIDER_SITE_OTHER): Payer: Medicare HMO | Admitting: Ophthalmology

## 2022-03-10 DIAGNOSIS — H353221 Exudative age-related macular degeneration, left eye, with active choroidal neovascularization: Secondary | ICD-10-CM

## 2022-03-10 DIAGNOSIS — H35723 Serous detachment of retinal pigment epithelium, bilateral: Secondary | ICD-10-CM

## 2022-03-10 DIAGNOSIS — H353132 Nonexudative age-related macular degeneration, bilateral, intermediate dry stage: Secondary | ICD-10-CM

## 2022-03-10 DIAGNOSIS — H35372 Puckering of macula, left eye: Secondary | ICD-10-CM | POA: Diagnosis not present

## 2022-03-10 DIAGNOSIS — H35729 Serous detachment of retinal pigment epithelium, unspecified eye: Secondary | ICD-10-CM | POA: Insufficient documentation

## 2022-03-10 MED ORDER — BEVACIZUMAB 2.5 MG/0.1ML IZ SOSY
2.5000 mg | PREFILLED_SYRINGE | INTRAVITREAL | Status: AC | PRN
  Administered 2022-03-10: 2.5 mg via INTRAVITREAL

## 2022-03-10 NOTE — Assessment & Plan Note (Signed)
Will need vitrectomy membrane peel once the wet AMD left eye is controlled ?

## 2022-03-10 NOTE — Assessment & Plan Note (Signed)
Component of dry AMD however the left eye over the dome of the PED has new area of outer retinal intrusion pigment and subretinal hyper reflective material often signifying early CNVM.  With new relative scotoma present in the patient's vision, we will treat this as wet AMD. ? ?As this condition comes under control we will address the use of vitrectomy membrane peel for the severe epiretinal membrane OS ?

## 2022-03-10 NOTE — Patient Instructions (Signed)
Patient given pamphlet handout regarding symptoms after first injection, on what to expect ?

## 2022-03-10 NOTE — Progress Notes (Signed)
? ? ?03/10/2022 ? ?  ? ?CHIEF COMPLAINT ?Patient presents for  ?Chief Complaint  ?Patient presents with  ? Retina Evaluation  ? ? ? ? ?HISTORY OF PRESENT ILLNESS: ?Charlotte Aguirre is a 76 y.o. female who presents to the clinic today for:  ? ?HPI   ? ? Retina Evaluation   ? ?      ? Laterality: left eye  ? Onset: 01/18/2022  ? Associated Symptoms: Floaters and Blind Spot.  Negative for Flashes  ? ?  ?  ? ? Comments   ?ERM OS, Referred by Dr. Katy Fitch (last seen by Dr Zadie Rhine 2020). ?Patient states she had a stroke but is unsure the date. Patient states Dr. Katy Fitch wants her to have the "peel," per patient. Patient states she has had double vision badly since cataract Surgery, which was done on the right eye 01/18/2022. Patient states "when they did the left eye cataract surgery in 2020 I had double vision and now it is worse." ?Patient is not using any prescription eye drops. Patient states she has a blurred spot in her left eye "right below the line of vision and it is curved." ?Patient takes Preservision AREDS twice daily. ? ? ?  ?  ?Last edited by Laurin Coder on 03/10/2022  1:57 PM.  ?  ? ? ?Referring physician: ?Clent Jacks, MD ?[employer] ?[address] ?[place],  [place] [address] ? ?HISTORICAL INFORMATION:  ? ?Selected notes from the [place] ?  ? ?Lab Results  ?Component Value Date  ? HGBA1C 5.8 11/26/2017  ?  ? ?CURRENT MEDICATIONS: ?No current outpatient medications on file. (Ophthalmic Drugs)  ? ?No current facility-administered medications for this visit. (Ophthalmic Drugs)  ? ?Current Outpatient Medications (Other)  ?Medication Sig  ? calcium carbonate (OS-CAL - DOSED IN MG OF ELEMENTAL CALCIUM) 1250 (500 Ca) MG tablet Take 1 tablet by mouth. (Patient not taking: Reported on 02/19/2022)  ? Cholecalciferol (VITAMIN D-3 PO) Take 1 tablet by mouth daily. 2000 units  ? gabapentin (NEURONTIN) 300 MG capsule TAKE 1 CAPSULE BY MOUTH THREE TIMES A DAY  ? hydrochlorothiazide (HYDRODIURIL) 25 MG tablet TAKE 1 TABLET (25 MG  TOTAL) BY MOUTH DAILY.  ? linaclotide (LINZESS) 72 MCG capsule Take 1 capsule (72 mcg total) by mouth daily before breakfast.  ? metoprolol tartrate (LOPRESSOR) 100 MG tablet Take 1 tablet (100 mg total) by mouth once for 1 dose. Take 1 tablet 2 hours before your CT scan  ? Multiple Vitamins-Minerals (PRESERVISION AREDS 2 PO) daily.  ? ondansetron (ZOFRAN) 4 MG tablet Take 1 tablet (4 mg total) by mouth every 8 (eight) hours as needed for nausea or vomiting.  ? pantoprazole (PROTONIX) 40 MG tablet TAKE 1 TABLET BY MOUTH EVERY DAY  ? potassium chloride (KLOR-CON) 10 MEQ tablet TAKE 1 TABLET BY MOUTH EVERY DAY  ? propranolol (INDERAL) 10 MG tablet Take 1 tablet (10 mg total) by mouth 3 (three) times daily as needed.  ? simvastatin (ZOCOR) 40 MG tablet TAKE 1 TABLET BY MOUTH EVERY DAY  ? ?No current facility-administered medications for this visit. (Other)  ? ? ? ? ?REVIEW OF SYSTEMS: ?ROS   ?Negative for: Constitutional, Gastrointestinal, Neurological, Skin, Genitourinary, Musculoskeletal, HENT, Endocrine, Cardiovascular, Eyes, Respiratory, Psychiatric, Allergic/Imm, Heme/Lymph ?Last edited by Hurman Horn, MD on 03/10/2022  2:38 PM.  ?  ? ? ? ?ALLERGIES ?Allergies  ?Allergen Reactions  ? Aspirin   ?  Must take EC, prior history of stomach ulcer. ?Other reaction(s):  Angioedema (ALLERGY/intolerance) ?Must take EC, prior history of stomach ulcer.  ? Bactrim [Sulfamethoxazole-Trimethoprim] Nausea And Vomiting  ? Topiramate Other (See Comments)  ?  hallucinations  ? ? ?PAST MEDICAL HISTORY ?Past Medical History:  ?Diagnosis Date  ? Anxiety   ? situational anxiety/stress  ? Arthritis   ? thumb and fingers  ? Cataract   ? bilateral -LEFT sx completed  ? Fibromyalgia   ? GERD (gastroesophageal reflux disease)   ? on meds  ? Hyperlipidemia   ? on meds  ? Hypertension   ? on meds  ? IBS (irritable bowel syndrome)   ? Memory changes   ? Osteopenia   ? Peripheral neuropathy   ? bilateral feet  ? Vitamin D insufficiency   ? ?Past  Surgical History:  ?Procedure Laterality Date  ? ABDOMINAL HYSTERECTOMY    ? uterus frist and 10 years later both ovaries removed  ? APPENDECTOMY    ? BLADDER SURGERY    ? bladder tack  ? BREAST EXCISIONAL BIOPSY    ? CATARACT EXTRACTION Left 01/23/2022  ? CHOLECYSTECTOMY    ? COLONOSCOPY N/A 03/22/2014  ? Procedure: COLONOSCOPY;  Surgeon: Rogene Houston, MD;  [facility];  Service: Endoscopy;  Laterality: N/A;  930  ? OOPHORECTOMY    ? TOE SURGERY Left   ? 2nd toe on RIGHT foot w/pins  ? WISDOM TOOTH EXTRACTION    ? ? ?FAMILY HISTORY ?Family History  ?Problem Relation Age of Onset  ? Dementia Brother   ? Heart attack Brother   ? Heart failure Mother   ? Heart failure Father   ? Heart attack Father   ? Glaucoma Father   ? Heart failure Brother   ? Heart attack Brother   ? Heart failure Sister   ? Heart attack Sister   ? Diabetes Sister   ? Obesity Sister   ? Cancer Brother   ?     LYMPHOMA  ? Heart attack Brother   ? Stomach cancer Brother   ?     lymphoma  ? Cancer Brother   ?     lymphoma-hip  ? COPD Sister   ?     from 2nd hand smoke - never a smoker  ? Osteoporosis Sister   ? Colon polyps Sister   ? Thyroid disease Son   ? Throat cancer Son   ? Tongue cancer Son   ? Colon cancer Neg Hx   ? Esophageal cancer Neg Hx   ? Rectal cancer Neg Hx   ? ? ?SOCIAL HISTORY ?Social History  ? ?Tobacco Use  ? Smoking status: Former  ?  Packs/day: 1.50  ?  Years: 30.00  ?  Pack years: 45.00  ?  Types: Cigarettes  ?  Quit date: 11/03/1988  ?  Years since quitting: 33.3  ? Smokeless tobacco: Never  ?Vaping Use  ? Vaping Use: Never used  ?Substance Use Topics  ? Alcohol use: No  ? Drug use: No  ? ?  ? ?  ? ?OPHTHALMIC EXAM: ? ?Base Eye Exam   ? ? Visual Acuity (ETDRS)   ? ?   Right Left  ? Dist cc 20/20 20/30 -2  ? ? Correction: Glasses  ? ?  ?  ? ? Tonometry (Tonopen, 1:57 PM)   ? ?   Right Left  ? Pressure 10 9  ? ?  ?  ? ? Pupils   ? ?   Pupils Dark Light APD  ?  Right PERRL 5 4 None  ? Left PERRL 6 5 None  ? ?  ?   ? ? Visual Fields (Counting fingers)   ? ?   Left Right  ?  Full Full  ? ?  ?  ? ? Extraocular Movement   ? ?   Right Left  ?  Full Full  ? ?  ?  ? ? Neuro/Psych   ? ? Oriented x3: Yes  ? Mood/Affect: Normal  ? ?  ?  ? ? Dilation   ? ? Both eyes: 1.0% Mydriacyl, 2.5% Phenylephrine @ 1:57 PM  ? ?  ?  ? ?  ? ?Slit Lamp and Fundus Exam   ? ? External Exam   ? ?   Right Left  ? External Normal Normal  ? ?  ?  ? ? Slit Lamp Exam   ? ?   Right Left  ? Lids/Lashes Normal Normal  ? Conjunctiva/Sclera White and quiet White and quiet  ? Cornea Clear Clear  ? Anterior Chamber Deep and quiet Deep and quiet  ? Iris Round and reactive Round and reactive  ? Lens Posterior chamber intraocular lens Posterior chamber intraocular lens  ? Anterior Vitreous Normal Normal  ? ?  ?  ? ? Fundus Exam   ? ?   Right Left  ? Posterior Vitreous Posterior vitreous detachment Normal  ? Disc Normal Normal  ? C/D Ratio 0.2 0.2  ? Macula Soft drusen, no hemorrhage, no macular thickening Soft drusen, no hemorrhage, Epiretinal membrane  ? Vessels Normal Normal  ? Periphery Normal Normal  ? ?  ?  ? ?  ? ? ?IMAGING AND PROCEDURES  ?Imaging and Procedures for 03/10/22 ? ?OCT, Retina - OU - Both Eyes   ? ?   ?Right Eye ?Quality was good. Scan locations included subfoveal. Central Foveal Thickness: 284. Progression has been stable. Findings include pigment epithelial detachment, abnormal foveal contour, retinal drusen .  ? ?Left Eye ?Quality was good. Scan locations included subfoveal. Central Foveal Thickness: 448. Progression has worsened. Findings include abnormal foveal contour, subretinal hyper-reflective material, epiretinal membrane, pigment epithelial detachment, retinal drusen .  ? ?Notes ?OD with subfoveal PED no sign of CNVM ? ?OS, with subfoveal PED however there is subretinal foveal reflective material with apparent intrusion of subfoveal hyper reflective material suggestive of CNVM.  This is coincident with new relative scotoma patient  notices inferior to FAZ.  We will need to inject the left eye to treat for wet AMD to look for response of the of this lesion to therapy. ?OS also has severe epiretinal membrane which require vitrectomy [place]

## 2022-03-11 ENCOUNTER — Ambulatory Visit: Payer: Medicare HMO | Admitting: Physical Therapy

## 2022-03-11 ENCOUNTER — Encounter: Payer: Self-pay | Admitting: Physical Therapy

## 2022-03-11 DIAGNOSIS — M25551 Pain in right hip: Secondary | ICD-10-CM

## 2022-03-11 DIAGNOSIS — M25552 Pain in left hip: Secondary | ICD-10-CM | POA: Diagnosis not present

## 2022-03-11 NOTE — Therapy (Signed)
[place] ?[facility] ?[address] ?[place], [place], [address] ?Phone: [phone]   Fax:  [phone] ? ?Physical Therapy Treatment ? ?Patient Details  ?Name: Charlotte Aguirre ?MRN: [id number] ?Date of Birth: 1946-10-07 ?Referring Provider (PT): Darla Lesches ? ? ?Encounter Date: 03/11/2022 ? ? PT End of Session - 03/11/22 1035   ? ? Visit Number 7   ? Number of Visits 8   ? Date for PT Re-Evaluation 03/19/22   ? Authorization Type FOTO AT LEAST EVERY 5TH VISIT.  PROGRESS NOTE AT 10TH VISIT.  KX MODIFIER AFTER 15 VISITS.   ? PT Start Time 1031   ? PT Stop Time 1114   ? PT Time Calculation (min) 43 min   ? Activity Tolerance Patient tolerated treatment well   ? Behavior During Therapy [facility] for tasks assessed/performed   ? ?  ?  ? ?  ? ? ?Past Medical History:  ?Diagnosis Date  ? Anxiety   ? situational anxiety/stress  ? Arthritis   ? thumb and fingers  ? Cataract   ? bilateral -LEFT sx completed  ? Fibromyalgia   ? GERD (gastroesophageal reflux disease)   ? on meds  ? Hyperlipidemia   ? on meds  ? Hypertension   ? on meds  ? IBS (irritable bowel syndrome)   ? Memory changes   ? Osteopenia   ? Peripheral neuropathy   ? bilateral feet  ? Vitamin D insufficiency   ? ? ?Past Surgical History:  ?Procedure Laterality Date  ? ABDOMINAL HYSTERECTOMY    ? uterus frist and 10 years later both ovaries removed  ? APPENDECTOMY    ? BLADDER SURGERY    ? bladder tack  ? BREAST EXCISIONAL BIOPSY    ? CATARACT EXTRACTION Left 01/23/2022  ? CHOLECYSTECTOMY    ? COLONOSCOPY N/A 03/22/2014  ? Procedure: COLONOSCOPY;  Surgeon: Rogene Houston, MD;  [facility];  Service: Endoscopy;  Laterality: N/A;  930  ? OOPHORECTOMY    ? TOE SURGERY Left   ? 2nd toe on RIGHT foot w/pins  ? WISDOM TOOTH EXTRACTION    ? ? ?There were no vitals filed for this visit. ? ? Subjective Assessment - 03/11/22 1034   ? ? Subjective Hips are doing better but had a bad day saturday.   ? Pertinent History OA, GERD,  Fibromylagia, HTN, osteopenia, bladder surgery.   ? How long can you sit comfortably? Transitioning from sit to stand is very painful.   ? How long can you stand comfortably? Varies....but up to 45 minutes.   ? How long can you walk comfortably? 1/10th of a mile.   ? Diagnostic tests X-rays.   ? Patient Stated Goals Decrease pain and do more.  Avoid surgery.   ? Currently in Pain? No/denies   ? ?  ?  ? ?  ? ? ? ? ? [facility] PT Assessment - 03/11/22 0001   ? ?  ? Assessment  ? Medical Diagnosis Bilateral hip pain.   ? Referring Provider (PT) Darla Lesches   ?  ? Restrictions  ? Weight Bearing Restrictions No   ? ?  ?  ? ?  ? ? ? ? ? ? ? ? ? ? ? ? ? ? ? ? [place] Adult PT Treatment/Exercise - 03/11/22 0001   ? ?  ? Knee/Hip Exercises: Aerobic  ? Nustep L3 x15 min   ?  ? Knee/Hip Exercises: Standing  ? Hip Flexion Stengthening;Both;20 reps;Knee bent   ?  Hip Flexion Limitations 3#   ? Hip Abduction Stengthening;Both;15 reps;Knee straight   step outs  ? Abduction Limitations 3#   ?  ? Knee/Hip Exercises: Seated  ? Long [employer] Strengthening;Both;20 reps;Weights   ? Long Arc Quad Weight 5 lbs.   ? Ball Squeeze x3 min   ? Clamshell with TheraBand Red   x3 min  ? Hamstring Curl Strengthening;Both;20 reps;Limitations   ? Hamstring Limitations red theraband   ? ?  ?  ? ?  ? ? ? ? ? ? ? ? ? ? ? ? ? ? ? PT Long Term Goals - 02/19/22 1430   ? ?  ? PT LONG TERM GOAL #1  ? Title Patient will be independent with her home exercise program.   ? Time 4   ? Period Weeks   ? Status New   ?  ? PT LONG TERM GOAL #2  ? Title Patient will report being able to walk a 1/4 with bilateral  pain at a 3-4/10 or less.   ? Time 4   ? Period Weeks   ? Status New   ?  ? PT LONG TERM GOAL #3  ? Title Bilateral hip abduction to 5/5 to increase stability for functional tasks.   ? Time 4   ? Period Weeks   ? Status New   ?  ? PT LONG TERM GOAL #4  ? Title Perform ADL's with bilateral hip pain  not > 3-4/10.   ? Time 4   ? Period Weeks   ? Status New   ?  ? PT  LONG TERM GOAL #5  ? Title Sleep undisturbed 6 hours.   ? Time 4   ? Period Weeks   ? Status New   ? ?  ?  ? ?  ? ? ? ? ? ? ? ? Plan - 03/11/22 1125   ? ? Clinical Impression Statement Patient presented in clinic with reports of improvement regarding hip pain but does have intermittant bad days. Patient progressed through resistive strengthening in standing and sitting but limited with hip flexion in sitting for R hip due to pain and limitation.   ? Personal Factors and Comorbidities Comorbidity 1;Other   ? Comorbidities OA, GERD, Fibromylagia, HTN, osteopenia, bladder surgery.   ? Examination-Activity Limitations Other;Transfers;Locomotion Level;Stand;Sit;Sleep   ? Examination-Participation Restrictions Other;Meal Prep;Cleaning   ? Stability/Clinical Decision Making Evolving/Moderate complexity   ? PT Frequency 2x / week   ? PT Duration 4 weeks   ? PT Treatment/Interventions ADLs/Self Care Home Management;Cryotherapy;Electrical Stimulation;Ultrasound;Moist Heat;Gait training;Stair training;Functional mobility training;Therapeutic activities;Therapeutic exercise;Manual techniques;Patient/family education;Passive range of motion;Dry needling   ? PT Next Visit Plan Nustep/recumbent bike, bilateral hip abduction strengthening, STW/M and modalities as needed to left greater trochanter area.  Pain-free ther ex.   ? Consulted and Agree with Plan of Care Patient   ? ?  ?  ? ?  ? ? ?Patient will benefit from skilled therapeutic intervention in order to improve the following deficits and impairments:  Abnormal gait, Difficulty walking, Decreased activity tolerance, Decreased mobility, Decreased strength, Pain ? ?Visit Diagnosis: ?Pain in right hip ? ?Pain in left hip ? ? ? ? ?Problem List ?Patient Active Problem List  ? Diagnosis Date Noted  ? Epiretinal membrane, left eye 03/10/2022  ? Exudative age-related macular degeneration of left eye with active choroidal neovascularization ([place]) 03/10/2022  ? Intermediate stage  nonexudative age-related macular degeneration of both eyes 03/10/2022  ? Retinal pigment epithelial detachment 03/10/2022  ?  Edema 04/17/2021  ? Educated about COVID-19 virus infection 10/11/2019  ? Hypokalemia 08/02/2019  ? Obesity (BMI 30-39.9) 04/15/2016  ? Metabolic syndrome 91/47/8295  ? Slow transit constipation 07/27/2015  ? Rectocele 10/31/2014  ? Osteopenia 09/13/2014  ? Prediabetes 06/23/2014  ? Trigeminal neuralgia syndrome 02/02/2014  ? Vitamin D deficiency 02/02/2014  ? Memory impairment 12/12/2013  ? GERD (gastroesophageal reflux disease)   ? Essential hypertension   ? Irritable bowel syndrome 11/06/2009  ? Hereditary and idiopathic peripheral neuropathy 02/28/2009  ? Hyperlipidemia with target LDL less than 100 02/27/2009  ? Allergic rhinitis 02/27/2009  ? Diverticulosis of colon 02/27/2009  ? Osteoarthritis 02/27/2009  ? Myalgia and myositis 02/27/2009  ? PEPTIC ULCER DISEASE, HX OF 02/27/2009  ? ? ?Standley Brooking, PTA ?03/11/2022, 11:33 AM ? ?[place] ?[facility] ?[address] ?[place], [place], [address] ?Phone: [phone]   Fax:  [phone] ? ?Name: Xiao Graul Rufer ?MRN: [id number] ?Date of Birth: [birth year] ? ? ? ?

## 2022-03-13 ENCOUNTER — Ambulatory Visit: Payer: Medicare HMO | Admitting: Physical Therapy

## 2022-03-13 ENCOUNTER — Encounter: Payer: Self-pay | Admitting: Physical Therapy

## 2022-03-13 DIAGNOSIS — M25551 Pain in right hip: Secondary | ICD-10-CM | POA: Diagnosis not present

## 2022-03-13 DIAGNOSIS — M25552 Pain in left hip: Secondary | ICD-10-CM | POA: Diagnosis not present

## 2022-03-13 NOTE — Therapy (Addendum)
[facility] ?[place] ?[place], [place], [address] ?Phone: [phone]   Fax:  [phone] ? ?Physical Therapy Treatment ? ?Patient Details  ?Name: Charlotte Aguirre ?MRN: [id number] ?Date of Birth: 09-17-1946 ?Referring Provider (PT): Darla Lesches ? ? ?Encounter Date: 03/13/2022 ? ? PT End of Session - 03/13/22 1102   ? ? Visit Number 8   ? Number of Visits 8   ? Date for PT Re-Evaluation 03/19/22   ? Authorization Type FOTO AT LEAST EVERY 5TH VISIT.  PROGRESS NOTE AT 10TH VISIT.  KX MODIFIER AFTER 15 VISITS.   ? PT Start Time 1031   ? PT Stop Time 1102   ? PT Time Calculation (min) 31 min   ? Activity Tolerance Patient limited by pain   ? Behavior During Therapy [facility] for tasks assessed/performed   ? ?  ?  ? ?  ? ? ?Past Medical History:  ?Diagnosis Date  ? Anxiety   ? situational anxiety/stress  ? Arthritis   ? thumb and fingers  ? Cataract   ? bilateral -LEFT sx completed  ? Fibromyalgia   ? GERD (gastroesophageal reflux disease)   ? on meds  ? Hyperlipidemia   ? on meds  ? Hypertension   ? on meds  ? IBS (irritable bowel syndrome)   ? Memory changes   ? Osteopenia   ? Peripheral neuropathy   ? bilateral feet  ? Vitamin D insufficiency   ? ? ?Past Surgical History:  ?Procedure Laterality Date  ? ABDOMINAL HYSTERECTOMY    ? uterus frist and 10 years later both ovaries removed  ? APPENDECTOMY    ? BLADDER SURGERY    ? bladder tack  ? BREAST EXCISIONAL BIOPSY    ? CATARACT EXTRACTION Left 01/23/2022  ? CHOLECYSTECTOMY    ? COLONOSCOPY N/A 03/22/2014  ? Procedure: COLONOSCOPY;  Surgeon: Rogene Houston, MD;  [facility];  Service: Endoscopy;  Laterality: N/A;  930  ? OOPHORECTOMY    ? TOE SURGERY Left   ? 2nd toe on RIGHT foot w/pins  ? WISDOM TOOTH EXTRACTION    ? ? ?There were no vitals filed for this visit. ? ? Subjective Assessment - 03/13/22 1047   ? ? Subjective Reports hurting more but wants to leave by 11 am. Reports that her L hip doesn't bother her much  anymore but having more R groin pain now.   ? Pertinent History OA, GERD, Fibromylagia, HTN, osteopenia, bladder surgery.   ? How long can you sit comfortably? Transitioning from sit to stand is very painful.   ? How long can you stand comfortably? Varies....but up to 45 minutes.   ? How long can you walk comfortably? 1/10th of a mile.   ? Diagnostic tests X-rays.   ? Patient Stated Goals Decrease pain and do more.  Avoid surgery.   ? Currently in Pain? Yes   ? Pain Score 2    ? Pain Location Groin   ? Pain Orientation Right   ? Pain Descriptors / Indicators Discomfort   ? Pain Type Chronic pain   ? Pain Onset More than a month ago   ? Pain Frequency Constant   ? ?  ?  ? ?  ? ? ? ? ? [facility] PT Assessment - 03/13/22 0001   ? ?  ? Assessment  ? Medical Diagnosis Bilateral hip pain.   ? Referring Provider (PT) Darla Lesches   ? ?  ?  ? ?  ? ? ? ? ? ? ? ? ? ? ? ? ? ? ? ?  [facility] Adult PT Treatment/Exercise - 03/13/22 0001   ? ?  ? Knee/Hip Exercises: Aerobic  ? Nustep L3 x15 min   ?  ? Knee/Hip Exercises: Seated  ? Long [employer] Strengthening;Both;20 reps;Weights   ? Long Arc Quad Weight 4 lbs.   ? Clamshell with TheraBand Red   x2 min  ? Marching Strengthening;Both;20 reps;Weights   ? Marching Limitations 3#   ? Hamstring Curl Strengthening;Both;20 reps;Limitations   ? Hamstring Limitations red theraband   ? ?  ?  ? ?  ? ? ? ? ? ? ? ? ? ? ? ? ? ? ? PT Long Term Goals - 03/13/22 1100   ? ?  ? PT LONG TERM GOAL #1  ? Title Patient will be independent with her home exercise program.   ? Time 4   ? Period Weeks   ? Status Unable to assess   ?  ? PT LONG TERM GOAL #2  ? Title Patient will report being able to walk a 1/4 with bilateral  pain at a 3-4/10 or less.   ? Time 4   ? Period Weeks   ? Status Not Met   ?  ? PT LONG TERM GOAL #3  ? Title Bilateral hip abduction to 5/5 to increase stability for functional tasks.   ? Time 4   ? Period Weeks   ? Status Unable to assess   due to pain  ?  ? PT LONG TERM GOAL #4  ? Title Perform  ADL's with bilateral hip pain  not > 3-4/10.   ? Time 4   ? Period Weeks   ? Status Not Met   ?  ? PT LONG TERM GOAL #5  ? Title Sleep undisturbed 6 hours.   ? Time 4   ? Period Weeks   ? Status Achieved   ? ?  ?  ? ?  ? ? ? ? ? ? ? ? Plan - 03/13/22 1109   ? ? Clinical Impression Statement Patient presented in clinic with reports of more R groin pain and requesting to leave appointment early. Patient limited with walking distance as well even though pain is slightly more controlled. Patient still limited with therex and ADLs due to pain. Pain in hip/groin limited MMT assessment.   ? Personal Factors and Comorbidities Comorbidity 1;Other   ? Comorbidities OA, GERD, Fibromylagia, HTN, osteopenia, bladder surgery.   ? Examination-Activity Limitations Other;Transfers;Locomotion Level;Stand;Sit;Sleep   ? Examination-Participation Restrictions Other;Meal Prep;Cleaning   ? Stability/Clinical Decision Making Evolving/Moderate complexity   ? PT Frequency 2x / week   ? PT Duration 4 weeks   ? PT Treatment/Interventions ADLs/Self Care Home Management;Cryotherapy;Electrical Stimulation;Ultrasound;Moist Heat;Gait training;Stair training;Functional mobility training;Therapeutic activities;Therapeutic exercise;Manual techniques;Patient/family education;Passive range of motion;Dry needling   ? PT Next Visit Plan Nustep/recumbent bike, bilateral hip abduction strengthening, STW/M and modalities as needed to left greater trochanter area.  Pain-free ther ex.   ? Consulted and Agree with Plan of Care Patient   ? ?  ?  ? ?  ? ? ?Patient will benefit from skilled therapeutic intervention in order to improve the following deficits and impairments:  Abnormal gait, Difficulty walking, Decreased activity tolerance, Decreased mobility, Decreased strength, Pain ? ?Visit Diagnosis: ?Pain in right hip ? ?Pain in left hip ? ? ? ? ?Problem List ?Patient Active Problem List  ? Diagnosis Date Noted  ? Epiretinal membrane, left eye 03/10/2022  ?  Exudative age-related macular degeneration of left eye with active choroidal  neovascularization ([place]) 03/10/2022  ? Intermediate stage nonexudative age-related macular degeneration of both eyes 03/10/2022  ? Retinal pigment epithelial detachment 03/10/2022  ? Edema 04/17/2021  ? Educated about COVID-19 virus infection 10/11/2019  ? Hypokalemia 08/02/2019  ? Obesity (BMI 30-39.9) 04/15/2016  ? Metabolic syndrome 43/15/4008  ? Slow transit constipation 07/27/2015  ? Rectocele 10/31/2014  ? Osteopenia 09/13/2014  ? Prediabetes 06/23/2014  ? Trigeminal neuralgia syndrome 02/02/2014  ? Vitamin D deficiency 02/02/2014  ? Memory impairment 12/12/2013  ? GERD (gastroesophageal reflux disease)   ? Essential hypertension   ? Irritable bowel syndrome 11/06/2009  ? Hereditary and idiopathic peripheral neuropathy 02/28/2009  ? Hyperlipidemia with target LDL less than 100 02/27/2009  ? Allergic rhinitis 02/27/2009  ? Diverticulosis of colon 02/27/2009  ? Osteoarthritis 02/27/2009  ? Myalgia and myositis 02/27/2009  ? PEPTIC ULCER DISEASE, HX OF 02/27/2009  ? ? ?Standley Brooking, PTA ?03/13/2022, 11:12 AM ? ?[place] [facility] ?[place] ?[place], [place], [address] ?Phone: [phone]   Fax:  [phone] ? ?Name: Charlotte Aguirre ?MRN: [id number] ?Date of Birth: June 13, 1946 ? ?PHYSICAL THERAPY DISCHARGE SUMMARY ? ?Visits from Start of Care: 8. ? ?Current functional level related to goals / functional outcomes: ?See above. ?  ?Remaining deficits: ?Continued pain.  LTG #5 met. ?  ?Education / Equipment: ?HEP.  ? ?Patient agrees to discharge. Patient goals were partially met. Patient is being discharged due to lack of progress. ? ? ? ?Mali Applegate MPT ? ? ? ?

## 2022-03-17 ENCOUNTER — Ambulatory Visit (INDEPENDENT_AMBULATORY_CARE_PROVIDER_SITE_OTHER): Payer: Medicare HMO | Admitting: Family Medicine

## 2022-03-17 ENCOUNTER — Encounter: Payer: Self-pay | Admitting: Family Medicine

## 2022-03-17 VITALS — BP 121/66 | HR 68 | Temp 96.8°F | Ht 61.0 in | Wt 192.4 lb

## 2022-03-17 DIAGNOSIS — L03011 Cellulitis of right finger: Secondary | ICD-10-CM

## 2022-03-17 DIAGNOSIS — M25541 Pain in joints of right hand: Secondary | ICD-10-CM | POA: Diagnosis not present

## 2022-03-17 MED ORDER — AMOXICILLIN-POT CLAVULANATE 875-125 MG PO TABS: 1.0000 | ORAL_TABLET | Freq: Two times a day (BID) | ORAL | 0 refills | Status: DC

## 2022-03-17 NOTE — Progress Notes (Signed)
Chief Complaint  ?Patient presents with  ? FINGER INFLAMMATION  ? ? ?HPI ? ?Patient presents today for pain IN RIGHT second finger. Awoke with it red and swollen yesterday. . A little warm also. NKI. Taking PT for arthritis in hips. Had apparent gout attack in the heel 40 years ago. None since.  ? ?PMH: Smoking status noted ?ROS: Per HPI ?Objective: ?BP 121/66   Pulse 68   Temp (!) 96.8 ?F (36 ?C)   Ht '5\' 1"'$  (1.549 m)   Wt 192 lb 6.4 oz (87.3 kg)   SpO2 96%   BMI 36.35 kg/m?  ?Gen: NAD, alert, cooperative with exam ?HEENT: NCAT, EOMI, PERRL ?Ext: No edema, warm. Right 2nd finger has erythema at the DIP dorsally.  ?Neuro: Alert and oriented, No gross deficits ? ?Assessment and plan: ? ?1. Pain involving joint of finger of right hand   ?2. Cellulitis of finger of right hand   ? ? ?Meds ordered this encounter  ?Medications  ? amoxicillin-clavulanate (AUGMENTIN) 875-125 MG tablet  ?  Sig: Take 1 tablet by mouth 2 (two) times daily. Take all of this medication  ?  Dispense:  20 tablet  ?  Refill:  0  ? ? ?Orders Placed This Encounter  ?Procedures  ? CBC with Differential/Platelet  ? Uric acid  ? ? ?Follow up as needed. ? ?[name], MD ? ? ? ? ?

## 2022-03-18 LAB — CBC WITH DIFFERENTIAL/PLATELET
Basophils Absolute: 0.1 10*3/uL (ref 0.0–0.2)
Basos: 1 %
EOS (ABSOLUTE): 0.3 10*3/uL (ref 0.0–0.4)
Eos: 3 %
Hematocrit: 42.1 % (ref 34.0–46.6)
Hemoglobin: 14.1 g/dL (ref 11.1–15.9)
Immature Grans (Abs): 0 10*3/uL (ref 0.0–0.1)
Immature Granulocytes: 0 %
Lymphocytes Absolute: 3 10*3/uL (ref 0.7–3.1)
Lymphs: 35 %
MCH: 27.6 pg (ref 26.6–33.0)
MCHC: 33.5 g/dL (ref 31.5–35.7)
MCV: 83 fL (ref 79–97)
Monocytes Absolute: 0.6 10*3/uL (ref 0.1–0.9)
Monocytes: 7 %
Neutrophils Absolute: 4.8 10*3/uL (ref 1.4–7.0)
Neutrophils: 54 %
Platelets: 289 10*3/uL (ref 150–450)
RBC: 5.1 x10E6/uL (ref 3.77–5.28)
RDW: 13.1 % (ref 11.7–15.4)
WBC: 8.7 10*3/uL (ref 3.4–10.8)

## 2022-03-18 LAB — URIC ACID: Uric Acid: 4.9 mg/dL (ref 3.1–7.9)

## 2022-03-18 NOTE — Progress Notes (Signed)
Hello  Shacoya,    Your lab result is normal and/or stable.Some minor variations that are not significant are commonly marked abnormal, but do not represent any medical problem for you.   Best regards,  Domanic Matusek, M.D.

## 2022-04-07 ENCOUNTER — Ambulatory Visit: Payer: Medicare HMO | Admitting: Family

## 2022-04-14 ENCOUNTER — Encounter (INDEPENDENT_AMBULATORY_CARE_PROVIDER_SITE_OTHER): Payer: Self-pay | Admitting: Ophthalmology

## 2022-04-14 ENCOUNTER — Ambulatory Visit (INDEPENDENT_AMBULATORY_CARE_PROVIDER_SITE_OTHER): Payer: Medicare HMO | Admitting: Ophthalmology

## 2022-04-14 DIAGNOSIS — H35722 Serous detachment of retinal pigment epithelium, left eye: Secondary | ICD-10-CM | POA: Diagnosis not present

## 2022-04-14 DIAGNOSIS — H353132 Nonexudative age-related macular degeneration, bilateral, intermediate dry stage: Secondary | ICD-10-CM

## 2022-04-14 DIAGNOSIS — H35372 Puckering of macula, left eye: Secondary | ICD-10-CM

## 2022-04-14 DIAGNOSIS — H353221 Exudative age-related macular degeneration, left eye, with active choroidal neovascularization: Secondary | ICD-10-CM

## 2022-04-14 DIAGNOSIS — Z961 Presence of intraocular lens: Secondary | ICD-10-CM

## 2022-04-14 MED ORDER — BEVACIZUMAB 2.5 MG/0.1ML IZ SOSY
2.5000 mg | PREFILLED_SYRINGE | INTRAVITREAL | Status: AC | PRN
  Administered 2022-04-14: 2.5 mg via INTRAVITREAL

## 2022-04-14 NOTE — Progress Notes (Signed)
04/14/2022     CHIEF COMPLAINT Patient presents for  Chief Complaint  Patient presents with   Macular Degeneration      HISTORY OF PRESENT ILLNESS: Charlotte Aguirre is a 76 y.o. female who presents to the clinic today for:   HPI   1 month for DILATE, OS, AVASTIN OCT,,DISCUSS VIT ILM PEEL , ERM.  Pt stated vision has gotten worse since last visit. Pt stated without glasses, double vision is really prominent. Pt reports double vision is even worse at night. Pt had cataract surgery on OD back in Feb or March performed by Dr. Katy Fitch.   Last edited by Silvestre Moment on 04/14/2022  2:48 PM.      Referring physician: Clent Jacks, MD [address] [place],  [place] [address]  HISTORICAL INFORMATION:   Selected notes from the MEDICAL RECORD NUMBER    Lab Results  Component Value Date   HGBA1C 5.8 11/26/2017     CURRENT MEDICATIONS: No current outpatient medications on file. (Ophthalmic Drugs)   No current facility-administered medications for this visit. (Ophthalmic Drugs)   Current Outpatient Medications (Other)  Medication Sig   amoxicillin-clavulanate (AUGMENTIN) 875-125 MG tablet Take 1 tablet by mouth 2 (two) times daily. Take all of this medication   calcium carbonate (OS-CAL - DOSED IN MG OF ELEMENTAL CALCIUM) 1250 (500 Ca) MG tablet Take 1 tablet by mouth. (Patient not taking: Reported on 02/19/2022)   Cholecalciferol (VITAMIN D-3 PO) Take 1 tablet by mouth daily. 2000 units   gabapentin (NEURONTIN) 300 MG capsule TAKE 1 CAPSULE BY MOUTH THREE TIMES A DAY   hydrochlorothiazide (HYDRODIURIL) 25 MG tablet TAKE 1 TABLET (25 MG TOTAL) BY MOUTH DAILY.   linaclotide (LINZESS) 72 MCG capsule Take 1 capsule (72 mcg total) by mouth daily before breakfast.   metoprolol tartrate (LOPRESSOR) 100 MG tablet Take 1 tablet (100 mg total) by mouth once for 1 dose. Take 1 tablet 2 hours before your CT scan   Multiple Vitamins-Minerals (PRESERVISION AREDS 2 PO) daily.   ondansetron (ZOFRAN) 4  MG tablet Take 1 tablet (4 mg total) by mouth every 8 (eight) hours as needed for nausea or vomiting.   pantoprazole (PROTONIX) 40 MG tablet TAKE 1 TABLET BY MOUTH EVERY DAY   potassium chloride (KLOR-CON) 10 MEQ tablet TAKE 1 TABLET BY MOUTH EVERY DAY   propranolol (INDERAL) 10 MG tablet Take 1 tablet (10 mg total) by mouth 3 (three) times daily as needed.   simvastatin (ZOCOR) 40 MG tablet TAKE 1 TABLET BY MOUTH EVERY DAY   No current facility-administered medications for this visit. (Other)      REVIEW OF SYSTEMS: ROS   Negative for: Constitutional, Gastrointestinal, Neurological, Skin, Genitourinary, Musculoskeletal, HENT, Endocrine, Cardiovascular, Eyes, Respiratory, Psychiatric, Allergic/Imm, Heme/Lymph Last edited by Silvestre Moment on 04/14/2022  2:48 PM.       ALLERGIES Allergies  Allergen Reactions   Aspirin     Must take EC, prior history of stomach ulcer. Other reaction(s): Angioedema (ALLERGY/intolerance) Must take EC, prior history of stomach ulcer.   Bactrim [Sulfamethoxazole-Trimethoprim] Nausea And Vomiting   Topiramate Other (See Comments)    hallucinations    PAST MEDICAL HISTORY Past Medical History:  Diagnosis Date   Anxiety    situational anxiety/stress   Arthritis    thumb and fingers   Cataract    bilateral -LEFT sx completed   Fibromyalgia    GERD (gastroesophageal reflux disease)    on meds   Hyperlipidemia  on meds   Hypertension    on meds   IBS (irritable bowel syndrome)    Memory changes    Osteopenia    Peripheral neuropathy    bilateral feet   Vitamin D insufficiency    Past Surgical History:  Procedure Laterality Date   ABDOMINAL HYSTERECTOMY     uterus frist and 10 years later both ovaries removed   APPENDECTOMY     BLADDER SURGERY     bladder tack   BREAST EXCISIONAL BIOPSY     CATARACT EXTRACTION Left 01/23/2022   CHOLECYSTECTOMY     COLONOSCOPY N/A 03/22/2014   Procedure: COLONOSCOPY;  Surgeon: Rogene Houston, MD;   [facility];  Service: Endoscopy;  Laterality: N/A;  52   OOPHORECTOMY     TOE SURGERY Left    2nd toe on RIGHT foot w/pins   WISDOM TOOTH EXTRACTION      FAMILY HISTORY Family History  Problem Relation Age of Onset   Dementia Brother    Heart attack Brother    Heart failure Mother    Heart failure Father    Heart attack Father    Glaucoma Father    Heart failure Brother    Heart attack Brother    Heart failure Sister    Heart attack Sister    Diabetes Sister    Obesity Sister    Cancer Brother        LYMPHOMA   Heart attack Brother    Stomach cancer Brother        lymphoma   Cancer Brother        lymphoma-hip   COPD Sister        from 2nd hand smoke - never a smoker   Osteoporosis Sister    Colon polyps Sister    Thyroid disease Son    Throat cancer Son    Tongue cancer Son    Colon cancer Neg Hx    Esophageal cancer Neg Hx    Rectal cancer Neg Hx     SOCIAL HISTORY Social History   Tobacco Use   Smoking status: Former    Packs/day: 1.50    Years: 30.00    Total pack years: 45.00    Types: Cigarettes    Quit date: 11/03/1988    Years since quitting: 33.4   Smokeless tobacco: Never  Vaping Use   Vaping Use: Never used  Substance Use Topics   Alcohol use: No   Drug use: No         OPHTHALMIC EXAM:  Base Eye Exam     Visual Acuity (ETDRS)       Right Left   Dist cc 20/20 20/30 -1    Correction: Glasses         Tonometry (Tonopen, 2:52 PM)       Right Left   Pressure 13 12         Pupils       Pupils APD   Right PERRL None   Left PERRL None         Visual Fields       Left Right    Full Full         Extraocular Movement       Right Left    Full Full         Neuro/Psych     Oriented x3: Yes   Mood/Affect: Normal         Dilation     Left  eye: 2.5% Phenylephrine, 1.0% Mydriacyl @ 2:51 PM           Slit Lamp and Fundus Exam     External Exam       Right Left   External Normal  Normal         Slit Lamp Exam       Right Left   Lids/Lashes Normal Normal   Conjunctiva/Sclera White and quiet White and quiet   Cornea Clear Clear   Anterior Chamber Deep and quiet Deep and quiet   Iris Round and reactive Round and reactive   Lens Posterior chamber intraocular lens Posterior chamber intraocular lens   Anterior Vitreous Normal Normal         Fundus Exam       Right Left   Posterior Vitreous Posterior vitreous detachment Normal   Disc Normal Normal   C/D Ratio 0.2 0.2   Macula Soft drusen, no hemorrhage, no macular thickening Soft drusen, no hemorrhage, Epiretinal membrane   Vessels Normal Normal   Periphery Normal Normal            IMAGING AND PROCEDURES  Imaging and Procedures for 04/14/22  OCT, Retina - OU - Both Eyes       Right Eye Quality was good. Scan locations included subfoveal. Central Foveal Thickness: 284. Progression has been stable. Findings include abnormal foveal contour, retinal drusen , pigment epithelial detachment.   Left Eye Quality was good. Scan locations included subfoveal. Central Foveal Thickness: 444. Progression has worsened. Findings include abnormal foveal contour, retinal drusen , subretinal hyper-reflective material, epiretinal membrane, pigment epithelial detachment.   Notes OD with subfoveal PED no sign of CNVM  OS, with subfoveal PED however there is subretinal foveal reflective material with apparent intrusion of subfoveal hyper reflective material suggestive of CNVM.  This is coincident with new relative scotoma patient notices inferior to FAZ.   Improved after injection #1 , AVASTIN .  We will need to inject the left eye to treat for wet AMD to look for response of the of this lesion to therapy. OS also has severe epiretinal membrane which require vitrectomy membrane peel to regain best possible vision.     Intravitreal Injection, Pharmacologic Agent - OS - Left Eye       Time Out 04/14/2022. 3:24 PM.  Confirmed correct patient, procedure, site, and patient consented.   Anesthesia Topical anesthesia was used. Anesthetic medications included Lidocaine 4%.   Procedure Preparation included 5% betadine to ocular surface, 10% betadine to eyelids. A 30 gauge needle was used.   Injection: 2.5 mg bevacizumab 2.5 MG/0.1ML   Route: Intravitreal, Site: Left Eye   NDC: [phone], Lot: [id number], Expiration date: 06/01/2022   Post-op Post injection exam found visual acuity of at least counting fingers. The patient tolerated the procedure well. There were no complications. The patient received written and verbal post procedure care education. Post injection medications included ocuflox.              ASSESSMENT/PLAN:  Exudative age-related macular degeneration of left eye with active choroidal neovascularization (HCC) The nature of wet macular degeneration was discussed with the patient.  Forms of therapy reviewed include the use of Anti-VEGF medications injected painlessly into the eye, as well as other possible treatment modalities, including thermal laser therapy. Fellow eye involvement and risks were discussed with the patient. Upon the finding of wet age related macular degeneration, treatment will be offered. The treatment regimen is on a treat as needed basis with  the intent to treat if necessary and extend interval of exams when possible. On average 1 out of 6 patients do not need lifetime therapy. However, the risk of recurrent disease is high for a lifetime.  Initially monthly, then periodic, examinations and evaluations will determine whether the next treatment is required on the day of the examination.  OS, with subfoveal involvement slightly improved post injection #1.  We will repeat injection today to maintain  Epiretinal membrane, left eye Very severe epiretinal membrane left eye, with macular topographic distortions.  Will need vitrectomy membrane peel left eye  Pseudophakia of  both eyes Stable OU  Retinal pigment epithelial detachment Component of wet AMD OS  Intermediate stage nonexudative age-related macular degeneration of both eyes No sign of CNVM OD     ICD-10-CM   1. Epiretinal membrane, left eye  H35.372 OCT, Retina - OU - Both Eyes    Intravitreal Injection, Pharmacologic Agent - OS - Left Eye    bevacizumab (AVASTIN) SOSY 2.5 mg    2. Exudative age-related macular degeneration of left eye with active choroidal neovascularization ([place])  H35.3221     3. Pseudophakia of both eyes  Z96.1     4. 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 Intermediate stage nonexudative age-related macular degeneration of both eyes  H35.3132       1.  OS, AMD slightly improved post injection of 1 Avastin for vascularized subfoveal PED with less intraretinal intrusion of a type III type of CNVM.  Repeat injection Avastin to maintain  2.  OS for visual field recovery will need vitrectomy membrane peel, we will schedule this in 2 weeks  3.  Ophthalmic Meds Ordered this visit:  Meds ordered this encounter  Medications   bevacizumab (AVASTIN) SOSY 2.5 mg       Return [facility], for Schedule vitrectomy, membrane peel, [id number], no gas.  There are no Patient Instructions on file for this visit.   Explained the diagnoses, plan, and follow up with the patient and they expressed understanding.  Patient expressed understanding of the importance of proper follow up care.   Clent Demark Orman Matsumura M.D. Diseases & Surgery of the Retina and Vitreous [facility] [place] 04/14/22     Abbreviations: M myopia (nearsighted); A astigmatism; H hyperopia (farsighted); P presbyopia; Mrx spectacle prescription;  CTL contact lenses; OD right eye; OS left eye; OU both eyes  XT exotropia; ET esotropia; PEK punctate epithelial keratitis; PEE punctate epithelial erosions; DES dry eye syndrome; MGD meibomian gland dysfunction; ATs artificial  tears; PFAT's preservative free artificial tears; [place] nuclear sclerotic cataract; PSC posterior subcapsular cataract; ERM epi-retinal membrane; PVD posterior vitreous detachment; RD retinal detachment; DM diabetes mellitus; DR diabetic retinopathy; NPDR non-proliferative diabetic retinopathy; PDR proliferative diabetic retinopathy; CSME clinically significant macular edema; DME diabetic macular edema; dbh dot blot hemorrhages; CWS cotton wool spot; POAG primary open angle glaucoma; C/D cup-to-disc ratio; HVF humphrey visual field; GVF goldmann visual field; OCT optical coherence tomography; IOP intraocular pressure; BRVO Branch retinal vein occlusion; CRVO central retinal vein occlusion; CRAO central retinal artery occlusion; BRAO branch retinal artery occlusion; RT retinal tear; SB scleral buckle; PPV pars plana vitrectomy; VH Vitreous hemorrhage; PRP panretinal laser photocoagulation; IVK intravitreal kenalog; VMT vitreomacular traction; MH Macular hole;  NVD neovascularization of the disc; NVE neovascularization elsewhere; AREDS age related eye disease study; ARMD age related macular degeneration; POAG primary open angle glaucoma; EBMD epithelial/anterior basement membrane dystrophy; ACIOL  anterior chamber intraocular lens; IOL intraocular lens; PCIOL posterior chamber intraocular lens; Phaco/IOL phacoemulsification with intraocular lens placement; [place] photorefractive keratectomy; LASIK laser assisted in situ keratomileusis; HTN hypertension; DM diabetes mellitus; COPD chronic obstructive pulmonary disease

## 2022-04-14 NOTE — Assessment & Plan Note (Signed)
Stable OU 

## 2022-04-14 NOTE — Assessment & Plan Note (Signed)
The nature of wet macular degeneration was discussed with the patient.  Forms of therapy reviewed include the use of Anti-VEGF medications injected painlessly into the eye, as well as other possible treatment modalities, including thermal laser therapy. Fellow eye involvement and risks were discussed with the patient. Upon the finding of wet age related macular degeneration, treatment will be offered. The treatment regimen is on a treat as needed basis with the intent to treat if necessary and extend interval of exams when possible. On average 1 out of 6 patients do not need lifetime therapy. However, the risk of recurrent disease is high for a lifetime.  Initially monthly, then periodic, examinations and evaluations will determine whether the next treatment is required on the day of the examination.  OS, with subfoveal involvement slightly improved post injection #1.  We will repeat injection today to maintain

## 2022-04-14 NOTE — Assessment & Plan Note (Signed)
No sign of CNVM OD 

## 2022-04-14 NOTE — Assessment & Plan Note (Signed)
Component of wet AMD OS 

## 2022-04-14 NOTE — Assessment & Plan Note (Addendum)
Very severe epiretinal membrane left eye, with macular topographic distortions.  Will need vitrectomy membrane peel left eye  Risk benefits reviewed with the patient.  Surgical intervention for the left eye will consist of about a 20-minute procedure at the surgical [facility] or the similar location whereby under local anesthesia with removal of this material can proceed so that the patient may improve and stabilize her vision from the wrinkle that is affecting the macular function, the center of the vision left eye.

## 2022-04-15 ENCOUNTER — Telehealth (INDEPENDENT_AMBULATORY_CARE_PROVIDER_SITE_OTHER): Payer: Self-pay

## 2022-04-15 NOTE — Telephone Encounter (Signed)
Pt called and reported that she was experiencing pain, watery eyes and swelling after injection yesterday (04/14/2022). Pt stated she wanted to let Dr. Zadie Rhine to be aware of her symptoms.  I was told to let front desk call the pt to schedule an appnt for pt to be seen. Orene Desanctis offered the pt an appnt for this afternoon but pt was unable to come due to diarrhea. Front desk offered an appnt in the AM but pt had other commitments. Ultimately, pt did not want to come in to be seen but will call back if symptoms persist. Pt reported that the swelling and pain was better overnight but it is still watering. The call was completed.

## 2022-04-20 ENCOUNTER — Other Ambulatory Visit: Payer: Self-pay | Admitting: Family

## 2022-04-20 DIAGNOSIS — I10 Essential (primary) hypertension: Secondary | ICD-10-CM

## 2022-04-23 ENCOUNTER — Ambulatory Visit (INDEPENDENT_AMBULATORY_CARE_PROVIDER_SITE_OTHER): Payer: Medicare HMO

## 2022-05-01 ENCOUNTER — Encounter (INDEPENDENT_AMBULATORY_CARE_PROVIDER_SITE_OTHER): Payer: Medicare HMO | Admitting: Ophthalmology

## 2022-05-10 ENCOUNTER — Other Ambulatory Visit: Payer: Self-pay | Admitting: Family

## 2022-05-10 DIAGNOSIS — K219 Gastro-esophageal reflux disease without esophagitis: Secondary | ICD-10-CM

## 2022-05-10 DIAGNOSIS — G609 Hereditary and idiopathic neuropathy, unspecified: Secondary | ICD-10-CM

## 2022-05-10 DIAGNOSIS — E876 Hypokalemia: Secondary | ICD-10-CM

## 2022-05-10 DIAGNOSIS — E785 Hyperlipidemia, unspecified: Secondary | ICD-10-CM

## 2022-05-10 DIAGNOSIS — I10 Essential (primary) hypertension: Secondary | ICD-10-CM

## 2022-05-16 ENCOUNTER — Ambulatory Visit (INDEPENDENT_AMBULATORY_CARE_PROVIDER_SITE_OTHER): Payer: Medicare HMO | Admitting: Family

## 2022-05-16 ENCOUNTER — Encounter: Payer: Self-pay | Admitting: Family

## 2022-05-16 VITALS — BP 131/65 | HR 68 | Temp 97.5°F | Ht 61.0 in | Wt 189.4 lb

## 2022-05-16 DIAGNOSIS — R110 Nausea: Secondary | ICD-10-CM | POA: Diagnosis not present

## 2022-05-16 DIAGNOSIS — E785 Hyperlipidemia, unspecified: Secondary | ICD-10-CM | POA: Diagnosis not present

## 2022-05-16 DIAGNOSIS — Z0001 Encounter for general adult medical examination with abnormal findings: Secondary | ICD-10-CM | POA: Diagnosis not present

## 2022-05-16 DIAGNOSIS — G609 Hereditary and idiopathic neuropathy, unspecified: Secondary | ICD-10-CM | POA: Diagnosis not present

## 2022-05-16 DIAGNOSIS — E876 Hypokalemia: Secondary | ICD-10-CM | POA: Diagnosis not present

## 2022-05-16 DIAGNOSIS — K5901 Slow transit constipation: Secondary | ICD-10-CM | POA: Diagnosis not present

## 2022-05-16 DIAGNOSIS — Z0000 Encounter for general adult medical examination without abnormal findings: Secondary | ICD-10-CM

## 2022-05-16 DIAGNOSIS — M160 Bilateral primary osteoarthritis of hip: Secondary | ICD-10-CM | POA: Diagnosis not present

## 2022-05-16 DIAGNOSIS — E559 Vitamin D deficiency, unspecified: Secondary | ICD-10-CM | POA: Diagnosis not present

## 2022-05-16 DIAGNOSIS — K219 Gastro-esophageal reflux disease without esophagitis: Secondary | ICD-10-CM

## 2022-05-16 DIAGNOSIS — I10 Essential (primary) hypertension: Secondary | ICD-10-CM

## 2022-05-16 MED ORDER — GABAPENTIN 300 MG PO CAPS: ORAL_CAPSULE | ORAL | 0 refills | Status: DC

## 2022-05-16 MED ORDER — PANTOPRAZOLE SODIUM 40 MG PO TBEC: 40.0000 mg | DELAYED_RELEASE_TABLET | Freq: Every day | ORAL | 0 refills | Status: DC

## 2022-05-16 MED ORDER — SIMVASTATIN 40 MG PO TABS: 40.0000 mg | ORAL_TABLET | Freq: Every day | ORAL | 0 refills | Status: DC

## 2022-05-16 MED ORDER — ONDANSETRON HCL 4 MG PO TABS: 4.0000 mg | ORAL_TABLET | Freq: Three times a day (TID) | ORAL | 2 refills | Status: DC | PRN

## 2022-05-16 MED ORDER — POTASSIUM CHLORIDE ER 10 MEQ PO TBCR: 10.0000 meq | EXTENDED_RELEASE_TABLET | Freq: Every day | ORAL | 0 refills | Status: DC

## 2022-05-16 MED ORDER — HYDROCHLOROTHIAZIDE 25 MG PO TABS: 25.0000 mg | ORAL_TABLET | Freq: Every day | ORAL | 0 refills | Status: DC

## 2022-05-16 NOTE — Progress Notes (Signed)
Subjective:    Patient ID: Charlotte Aguirre, female    DOB: 1946/06/12, 76 y.o.   MRN: [id number]  Chief Complaint  Patient presents with   Medical Management of Chronic Issues   Pt presents to the office today for CPE and chronic follow up. She is followed by GI for GERD, IBS,  diverticulosis, and abdominal pain as needed.    She is followed by Cardiologists for HTN and palpitations.  She is morbid obese with a BMI of 35 and hypertension and hyperlipidemia.  Hypertension This is a chronic problem. The current episode started more than 1 year ago. The problem has been resolved since onset. The problem is controlled. Associated symptoms include malaise/fatigue. Pertinent negatives include no peripheral edema or shortness of breath. Risk factors for coronary artery disease include dyslipidemia and obesity. The current treatment provides moderate improvement.  Constipation This is a chronic problem. The current episode started more than 1 year ago. The problem has been waxing and waning since onset. Her stool frequency is 4 to 5 times per week. Associated symptoms include nausea. Risk factors include obesity. She has tried laxatives for the symptoms. The treatment provided moderate relief.  Gastroesophageal Reflux She complains of belching, heartburn and nausea. This is a chronic problem. The current episode started more than 1 year ago. The problem occurs occasionally. Risk factors include obesity. She has tried a PPI for the symptoms. The treatment provided moderate relief.  Arthritis Presents for follow-up visit. She complains of pain and stiffness. The symptoms have been stable. Affected locations include the right hip. Her pain is at a severity of 9/10.  Hyperlipidemia This is a chronic problem. The current episode started more than 1 year ago. The problem is controlled. Recent lipid tests were reviewed and are normal. Exacerbating diseases include obesity. Pertinent negatives include no  shortness of breath. Current antihyperlipidemic treatment includes statins. The current treatment provides moderate improvement of lipids. Risk factors for coronary artery disease include dyslipidemia, hypertension, post-menopausal and a sedentary lifestyle.      Review of Systems  Constitutional:  Positive for malaise/fatigue.  Respiratory:  Negative for shortness of breath.   Gastrointestinal:  Positive for constipation, heartburn and nausea.  Musculoskeletal:  Positive for arthritis and stiffness.  All other systems reviewed and are negative.  Family History  Problem Relation Age of Onset   Dementia Brother    Heart attack Brother    Heart failure Mother    Heart failure Father    Heart attack Father    Glaucoma Father    Heart failure Brother    Heart attack Brother    Heart failure Sister    Heart attack Sister    Diabetes Sister    Obesity Sister    Cancer Brother        LYMPHOMA   Heart attack Brother    Stomach cancer Brother        lymphoma   Cancer Brother        lymphoma-hip   COPD Sister        from 2nd hand smoke - never a smoker   Osteoporosis Sister    Colon polyps Sister    Thyroid disease Son    Throat cancer Son    Tongue cancer Son    Colon cancer Neg Hx    Esophageal cancer Neg Hx    Rectal cancer Neg Hx    Social History   Socioeconomic History   Marital status: Married    Spouse  name: [name]   Number of children: 2   Years of education: 12   Highest education level: High school graduate  Occupational History   Occupation: Retired    Fish farm manager: [employer]  Tobacco Use   Smoking status: Former    Packs/day: 1.50    Years: 30.00    Total pack years: 45.00    Types: Cigarettes    Quit date: 11/03/1988    Years since quitting: 33.5   Smokeless tobacco: Never  Vaping Use   Vaping Use: Never used  Substance and Sexual Activity   Alcohol use: No   Drug use: No   Sexual activity: Yes    Partners: Male  Other Topics Concern   Not  on file  Social History Narrative   Retired. Lives with husband [name]. 2 sons, do not live locally. One small dog. She enjoys crocheting and quilting. Is involved in her church.   Social Determinants of Health   Financial Resource Strain: Low Risk  (10/07/2018)   Overall Financial Resource Strain (CARDIA)    Difficulty of Paying Living Expenses: Not hard at all  Food Insecurity: No Food Insecurity (10/07/2018)   Hunger Vital Sign    Worried About Running Out of Food in the Last Year: Never true    Ran Out of Food in the Last Year: Never true  Transportation Needs: No Transportation Needs (10/07/2018)   PRAPARE - Hydrologist (Medical): No    Lack of Transportation (Non-Medical): No  Physical Activity: Inactive (10/07/2018)   Exercise Vital Sign    Days of Exercise per Week: 0 days    Minutes of Exercise per Session: 0 min  Stress: No Stress Concern Present (10/07/2018)   [place]    Feeling of Stress : Not at all  Social Connections: [place] (10/07/2018)   Social Connection and Isolation Panel [NHANES]    Frequency of Communication with Friends and Family: More than three times a week    Frequency of Social Gatherings with Friends and Family: More than three times a week    Attends Religious Services: More than 4 times per year    Active Member of [employer] or Organizations: Yes    Attends Music therapist: More than 4 times per year    Marital Status: Married       Objective:   Physical Exam Vitals reviewed.  Constitutional:      General: She is not in acute distress.    Appearance: She is well-developed. She is obese.  HENT:     Head: Normocephalic and atraumatic.     Right Ear: Tympanic membrane normal.     Left Ear: Tympanic membrane normal.  Eyes:     Pupils: Pupils are equal, round, and reactive to light.  Neck:     Thyroid: No thyromegaly.   Cardiovascular:     Rate and Rhythm: Normal rate and regular rhythm.     Heart sounds: Normal heart sounds. No murmur heard. Pulmonary:     Effort: Pulmonary effort is normal. No respiratory distress.     Breath sounds: Normal breath sounds. No wheezing.  Abdominal:     General: Bowel sounds are normal. There is no distension.     Palpations: Abdomen is soft.     Tenderness: There is no abdominal tenderness.  Musculoskeletal:        General: No tenderness. Normal range of motion.     Cervical  back: Normal range of motion and neck supple.  Skin:    General: Skin is warm and dry.  Neurological:     Mental Status: She is alert and oriented to person, place, and time.     Cranial Nerves: No cranial nerve deficit.     Deep Tendon Reflexes: Reflexes are normal and symmetric.  Psychiatric:        Behavior: Behavior normal.        Thought Content: Thought content normal.        Judgment: Judgment normal.          BP 131/65   Pulse 68   Temp (!) 97.5 F (36.4 C)   Ht 5' 1"  (1.549 m)   Wt 189 lb 6.4 oz (85.9 kg)   SpO2 98%   BMI 35.79 kg/m   Assessment & Plan:   Charlotte Aguirre comes in today with chief complaint of Medical Management of Chronic Issues   Diagnosis and orders addressed:  1. Nausea - ondansetron (ZOFRAN) 4 MG tablet; Take 1 tablet (4 mg total) by mouth every 8 (eight) hours as needed for nausea or vomiting.  Dispense: 20 tablet; Refill: 2 - CMP14+EGFR - CBC with Differential/Platelet  2. Hereditary and idiopathic peripheral neuropathy - gabapentin (NEURONTIN) 300 MG capsule; TAKE 1 CAPSULE BY MOUTH THREE TIMES A DAY  Dispense: 90 capsule; Refill: 0 - CMP14+EGFR - CBC with Differential/Platelet  3. Essential hypertension - hydrochlorothiazide (HYDRODIURIL) 25 MG tablet; Take 1 tablet (25 mg total) by mouth daily.  Dispense: 90 tablet; Refill: 0 - CMP14+EGFR - CBC with Differential/Platelet  4. Gastroesophageal reflux disease without esophagitis -  pantoprazole (PROTONIX) 40 MG tablet; Take 1 tablet (40 mg total) by mouth daily.  Dispense: 90 tablet; Refill: 0 - CMP14+EGFR - CBC with Differential/Platelet  5. Hypokalemia - potassium chloride (KLOR-CON) 10 MEQ tablet; Take 1 tablet (10 mEq total) by mouth daily.  Dispense: 90 tablet; Refill: 0 - CMP14+EGFR - CBC with Differential/Platelet  6. Hyperlipidemia with target LDL less than 100 - simvastatin (ZOCOR) 40 MG tablet; Take 1 tablet (40 mg total) by mouth daily.  Dispense: 90 tablet; Refill: 0 - CMP14+EGFR - CBC with Differential/Platelet - Lipid panel  7. Slow transit constipation - CMP14+EGFR - CBC with Differential/Platelet  8. Primary osteoarthritis of both hips - CMP14+EGFR - CBC with Differential/Platelet  9. Vitamin D deficiency - CMP14+EGFR - CBC with Differential/Platelet - VITAMIN D 25 Hydroxy (Vit-D Deficiency, Fractures)  10. Morbid obesity ([place]) - CMP14+EGFR - CBC with Differential/Platelet  11. Annual physical exam - CMP14+EGFR - CBC with Differential/Platelet - Lipid panel - TSH - VITAMIN D 25 Hydroxy (Vit-D Deficiency, Fractures)   Labs pending Health Maintenance reviewed Diet and exercise encouraged  Follow up plan: 4 months    Evelina Dun, FNP

## 2022-05-16 NOTE — Patient Instructions (Signed)

## 2022-05-17 LAB — CBC WITH DIFFERENTIAL/PLATELET
Basophils Absolute: 0.1 10*3/uL (ref 0.0–0.2)
Basos: 1 %
EOS (ABSOLUTE): 0.2 10*3/uL (ref 0.0–0.4)
Eos: 2 %
Hematocrit: 44.3 % (ref 34.0–46.6)
Hemoglobin: 14.4 g/dL (ref 11.1–15.9)
Immature Grans (Abs): 0 10*3/uL (ref 0.0–0.1)
Immature Granulocytes: 0 %
Lymphocytes Absolute: 2.7 10*3/uL (ref 0.7–3.1)
Lymphs: 30 %
MCH: 27.5 pg (ref 26.6–33.0)
MCHC: 32.5 g/dL (ref 31.5–35.7)
MCV: 85 fL (ref 79–97)
Monocytes Absolute: 0.5 10*3/uL (ref 0.1–0.9)
Monocytes: 6 %
Neutrophils Absolute: 5.4 10*3/uL (ref 1.4–7.0)
Neutrophils: 61 %
Platelets: 257 10*3/uL (ref 150–450)
RBC: 5.24 x10E6/uL (ref 3.77–5.28)
RDW: 13.5 % (ref 11.7–15.4)
WBC: 8.9 10*3/uL (ref 3.4–10.8)

## 2022-05-17 LAB — LIPID PANEL
Chol/HDL Ratio: 4.4 ratio (ref 0.0–4.4)
Cholesterol, Total: 164 mg/dL (ref 100–199)
HDL: 37 mg/dL — ABNORMAL LOW (ref >39)
LDL Chol Calc (NIH): 90 mg/dL (ref 0–99)
Triglycerides: 219 mg/dL — ABNORMAL HIGH (ref 0–149)
VLDL Cholesterol Cal: 37 mg/dL (ref 5–40)

## 2022-05-17 LAB — CMP14+EGFR
ALT: 11 IU/L (ref 0–32)
AST: 16 IU/L (ref 0–40)
Albumin/Globulin Ratio: 1.8 (ref 1.2–2.2)
Albumin: 4.2 g/dL (ref 3.8–4.8)
Alkaline Phosphatase: 127 IU/L — ABNORMAL HIGH (ref 44–121)
BUN/Creatinine Ratio: 14 (ref 12–28)
BUN: 11 mg/dL (ref 8–27)
Bilirubin Total: 0.3 mg/dL (ref 0.0–1.2)
CO2: 28 mmol/L (ref 20–29)
Calcium: 9.7 mg/dL (ref 8.7–10.3)
Chloride: 99 mmol/L (ref 96–106)
Creatinine, Ser: 0.77 mg/dL (ref 0.57–1.00)
Globulin, Total: 2.3 g/dL (ref 1.5–4.5)
Glucose: 98 mg/dL (ref 70–99)
Potassium: 3.8 mmol/L (ref 3.5–5.2)
Sodium: 141 mmol/L (ref 134–144)
Total Protein: 6.5 g/dL (ref 6.0–8.5)
eGFR: 79 mL/min/{1.73_m2} (ref >59)

## 2022-05-17 LAB — TSH: TSH: 2.4 u[IU]/mL (ref 0.450–4.500)

## 2022-05-17 LAB — VITAMIN D 25 HYDROXY (VIT D DEFICIENCY, FRACTURES): Vit D, 25-Hydroxy: 32.6 ng/mL (ref 30.0–100.0)

## 2022-06-13 DIAGNOSIS — H353131 Nonexudative age-related macular degeneration, bilateral, early dry stage: Secondary | ICD-10-CM | POA: Diagnosis not present

## 2022-06-13 DIAGNOSIS — H35372 Puckering of macula, left eye: Secondary | ICD-10-CM | POA: Diagnosis not present

## 2022-06-13 DIAGNOSIS — H43813 Vitreous degeneration, bilateral: Secondary | ICD-10-CM | POA: Diagnosis not present

## 2022-06-13 DIAGNOSIS — Z961 Presence of intraocular lens: Secondary | ICD-10-CM | POA: Diagnosis not present

## 2022-06-20 DIAGNOSIS — M25551 Pain in right hip: Secondary | ICD-10-CM | POA: Diagnosis not present

## 2022-06-20 DIAGNOSIS — M25552 Pain in left hip: Secondary | ICD-10-CM | POA: Diagnosis not present

## 2022-06-23 ENCOUNTER — Ambulatory Visit (INDEPENDENT_AMBULATORY_CARE_PROVIDER_SITE_OTHER): Payer: Medicare HMO

## 2022-06-23 VITALS — Wt 189.0 lb

## 2022-06-23 DIAGNOSIS — Z0000 Encounter for general adult medical examination without abnormal findings: Secondary | ICD-10-CM

## 2022-06-23 NOTE — Progress Notes (Signed)
Subjective:   Charlotte Aguirre is a 76 y.o. female who presents for Medicare Annual (Subsequent) preventive examination.  Virtual Visit via Telephone Note  I connected with  Charlotte Aguirre on 06/23/22 at 12:00 PM EDT by telephone and verified that I am speaking with the correct person using two identifiers.  Location: Patient: Home Provider: [facility] Persons participating in the virtual visit: patient/Nurse Health Advisor   I discussed the limitations, risks, security and privacy concerns of performing an evaluation and management service by telephone and the availability of in person appointments. The patient expressed understanding and agreed to proceed.  Interactive audio and video telecommunications were attempted between this nurse and patient, however failed, due to patient having technical difficulties OR patient did not have access to video capability.  We continued and completed visit with audio only.  Some vital signs may be absent or patient reported.   Daneli Butkiewicz E Chrissy Ealey, LPN   Review of Systems     Cardiac Risk Factors include: advanced age (>34mn, >>37women);obesity (BMI >30kg/m2);sedentary lifestyle;hypertension     Objective:    Today's Vitals   06/23/22 1206  Weight: 189 lb (85.7 kg)   Body mass index is 35.71 kg/m.     06/23/2022   12:12 PM 02/19/2022    2:05 PM 08/08/2021   12:46 PM 05/13/2021    1:22 PM 03/02/2021   12:42 PM 01/23/2020    9:17 AM 10/07/2018    9:12 AM  Advanced Directives  Does Patient Have a Medical Advance Directive? No No No No No No No  Would patient like information on creating a medical advance directive? No - Patient declined   No - Patient declined  No - Patient declined No - Patient declined    Current Medications (verified) Outpatient Encounter Medications as of 06/23/2022  Medication Sig   Cholecalciferol (VITAMIN D-3 PO) Take 1 tablet by mouth daily. 2000 units   gabapentin (NEURONTIN) 300 MG capsule TAKE 1 CAPSULE BY MOUTH THREE TIMES  A DAY   hydrochlorothiazide (HYDRODIURIL) 25 MG tablet Take 1 tablet (25 mg total) by mouth daily.   Multiple Vitamins-Minerals (PRESERVISION AREDS 2 PO) daily.   ondansetron (ZOFRAN) 4 MG tablet Take 1 tablet (4 mg total) by mouth every 8 (eight) hours as needed for nausea or vomiting.   pantoprazole (PROTONIX) 40 MG tablet Take 1 tablet (40 mg total) by mouth daily.   potassium chloride (KLOR-CON) 10 MEQ tablet Take 1 tablet (10 mEq total) by mouth daily.   propranolol (INDERAL) 10 MG tablet Take 1 tablet (10 mg total) by mouth 3 (three) times daily as needed.   simvastatin (ZOCOR) 40 MG tablet Take 1 tablet (40 mg total) by mouth daily.   linaclotide (LINZESS) 72 MCG capsule Take 1 capsule (72 mcg total) by mouth daily before breakfast. (Patient not taking: Reported on 06/23/2022)   metoprolol tartrate (LOPRESSOR) 100 MG tablet Take 1 tablet (100 mg total) by mouth once for 1 dose. Take 1 tablet 2 hours before your CT scan   No facility-administered encounter medications on file as of 06/23/2022.    Allergies (verified) Aspirin, Bactrim [sulfamethoxazole-trimethoprim], and Topiramate   History: Past Medical History:  Diagnosis Date   Anxiety    situational anxiety/stress   Arthritis    thumb and fingers   Cataract    bilateral -LEFT sx completed   Fibromyalgia    GERD (gastroesophageal reflux disease)    on meds   Hyperlipidemia    on meds  Hypertension    on meds   IBS (irritable bowel syndrome)    Memory changes    Osteopenia    Peripheral neuropathy    bilateral feet   Vitamin D insufficiency    Past Surgical History:  Procedure Laterality Date   ABDOMINAL HYSTERECTOMY     uterus frist and 10 years later both ovaries removed   APPENDECTOMY     BLADDER SURGERY     bladder tack   BREAST EXCISIONAL BIOPSY     CATARACT EXTRACTION Left 01/23/2022   CATARACT EXTRACTION Right    CHOLECYSTECTOMY     COLONOSCOPY N/A 03/22/2014   Procedure: COLONOSCOPY;  Surgeon: Rogene Houston, MD;  Location: [facility];  Service: Endoscopy;  Laterality: N/A;  930   OOPHORECTOMY     TOE SURGERY Left    2nd toe on RIGHT foot w/pins   WISDOM TOOTH EXTRACTION     Family History  Problem Relation Age of Onset   Dementia Brother    Heart attack Brother    Heart failure Mother    Heart failure Father    Heart attack Father    Glaucoma Father    Heart failure Brother    Heart attack Brother    Heart failure Sister    Heart attack Sister    Diabetes Sister    Obesity Sister    Cancer Brother        LYMPHOMA   Heart attack Brother    Stomach cancer Brother        lymphoma   Cancer Brother        lymphoma-hip   COPD Sister        from 2nd hand smoke - never a smoker   Osteoporosis Sister    Colon polyps Sister    Thyroid disease Son    Throat cancer Son    Tongue cancer Son    Colon cancer Neg Hx    Esophageal cancer Neg Hx    Rectal cancer Neg Hx    Social History   Socioeconomic History   Marital status: Married    Spouse name: Charlotte Aguirre   Number of children: 2   Years of education: 12   Highest education level: High school graduate  Occupational History   Occupation: Retired    Fish farm manager: [employer]  Tobacco Use   Smoking status: Former    Packs/day: 1.50    Years: 30.00    Total pack years: 45.00    Types: Cigarettes    Quit date: 11/03/1988    Years since quitting: 33.6   Smokeless tobacco: Never  Vaping Use   Vaping Use: Never used  Substance and Sexual Activity   Alcohol use: No   Drug use: No   Sexual activity: Yes    Partners: Male  Other Topics Concern   Not on file  Social History Narrative   Retired. Lives with husband Charlotte Aguirre. 2 sons, do not live locally. One small dog. She enjoys crocheting and quilting. Is involved in her church.   Social Determinants of Health   Financial Resource Strain: Low Risk  (06/23/2022)   Overall Financial Resource Strain (CARDIA)    Difficulty of Paying Living Expenses: Not hard at all   Food Insecurity: No Food Insecurity (06/23/2022)   Hunger Vital Sign    Worried About Running Out of Food in the Last Year: Never true    Ran Out of Food in the Last Year: Never true  Transportation Needs: No Transportation  Needs (06/23/2022)   PRAPARE - Hydrologist (Medical): No    Lack of Transportation (Non-Medical): No  Physical Activity: Insufficiently Active (06/23/2022)   Exercise Vital Sign    Days of Exercise per Week: 7 days    Minutes of Exercise per Session: 10 min  Stress: No Stress Concern Present (06/23/2022)   [place]    Feeling of Stress : Not at all  Social Connections: [place] (06/23/2022)   Social Connection and Isolation Panel [NHANES]    Frequency of Communication with Friends and Family: More than three times a week    Frequency of Social Gatherings with Friends and Family: More than three times a week    Attends Religious Services: More than 4 times per year    Active Member of [employer] or Organizations: Yes    Attends Music therapist: More than 4 times per year    Marital Status: Married    Tobacco Counseling Counseling given: Not Answered   Clinical Intake:  Pre-visit preparation completed: Yes  Pain : No/denies pain     BMI - recorded: 35.71 Nutritional Status: BMI > 30  Obese Nutritional Risks: Nausea/ vomitting/ diarrhea (has a stomach virus right now) Diabetes: No  How often do you need to have someone help you when you read instructions, pamphlets, or other written materials from your doctor or pharmacy?: 1 - Never  Diabetic? no  Interpreter Needed?: No  Information entered by :: Kinney Sackmann, LPN   Activities of Daily Living    06/23/2022   12:12 PM  In your present state of health, do you have any difficulty performing the following activities:  Hearing? 1  Vision? 0  Difficulty concentrating or making decisions?  0  Walking or climbing stairs? 1  Dressing or bathing? 0  Doing errands, shopping? 0  Preparing Food and eating ? N  Using the Toilet? N  In the past six months, have you accidently leaked urine? N  Do you have problems with loss of bowel control? N  Managing your Medications? N  Managing your Finances? N  Housekeeping or managing your Housekeeping? Y    Patient Care Team: Sharion Balloon, FNP as PCP - General (Family Medicine) Rogene Houston, MD as Consulting Physician (Gastroenterology) Harlen Labs, MD as Referring Physician (Optometry)  Indicate any recent Medical Services you may have received from other than [facility] providers in the past year (date may be approximate).     Assessment:   This is a routine wellness examination for [facility].  Hearing/Vision screen Hearing Screening - Comments:: Denies hearing difficulties   Vision Screening - Comments:: Wears rx glasses - trouble with double vision - up to date with routine eye exams with [facility]  Dietary issues and exercise activities discussed: Current Exercise Habits: The patient does not participate in regular exercise at present, Exercise limited by: orthopedic condition(s)   Goals Addressed             This Visit's Progress    Exercise 3x per week (30 min per time)   Not on track    Exercising at the [facility] with Silver [employer]neakers is a great option. 06/23/22 -        Depression Screen    06/23/2022   12:11 PM 05/16/2022    9:26 AM 03/17/2022    2:14 PM 02/13/2022    8:59 AM 10/04/2021   11:49 AM 07/19/2021  3:50 PM 06/07/2021   12:01 PM  PHQ 2/9 Scores  PHQ - 2 Score 0 0 0 0 0 0 0  PHQ- 9 Score      0     Fall Risk    06/23/2022   12:08 PM 05/16/2022    9:26 AM 03/17/2022    2:14 PM 02/13/2022    8:59 AM 10/04/2021   11:49 AM  Fall Risk   Falls in the past year? 0 0 0 0 0  Number falls in past yr: 0      Injury with Fall? 0      Risk for fall due to : Orthopedic patient;Impaired balance/gait      Follow  up Falls prevention discussed;Education provided   Falls evaluation completed     FALL RISK PREVENTION PERTAINING TO THE HOME:  Any stairs in or around the home? No  If so, are there any without handrails? No  Home free of loose throw rugs in walkways, pet beds, electrical cords, etc? Yes  Adequate lighting in your home to reduce risk of falls? Yes   ASSISTIVE DEVICES UTILIZED TO PREVENT FALLS:  Life alert? No  Use of a cane, walker or w/c?  Cane prn Grab bars in the bathroom? Yes  Shower chair or bench in shower? Yes  Elevated toilet seat or a handicapped toilet? Yes   TIMED UP AND GO:  Was the test performed? No . Telephonic visit  Cognitive Function:    10/07/2018    9:53 AM 03/28/2015    2:52 PM  MMSE - Mini Mental State Exam  Orientation to time 5 5  Orientation to Place 5 5  Registration 3 3  Attention/ Calculation 5 5  Recall 3 3  Language- name 2 objects 2 2  Language- repeat 1 1  Language- follow 3 step command 3   Language- read & follow direction 1 1  Write a sentence 1 1  Copy design 1 1  Total score 30         06/23/2022   12:14 PM 05/13/2021    1:25 PM 01/23/2020    9:20 AM  6CIT Screen  What Year? 0 points 0 points 0 points  What month? 0 points 0 points 0 points  What time? 0 points 0 points 0 points  Count back from 20 0 points 0 points 0 points  Months in reverse 0 points 0 points 0 points  Repeat phrase 0 points 0 points 0 points  Total Score 0 points 0 points 0 points    Immunizations Immunization History  Administered Date(s) Administered   Fluad Quad(high Dose 65+) 07/08/2019, 07/19/2021   Influenza, High Dose Seasonal PF 07/24/2014, 08/07/2016, 08/11/2017, 08/11/2017, 08/31/2018, 07/08/2019   Influenza,inj,Quad PF,6+ Mos 08/21/2015   Influenza-Unspecified 07/23/2020   Moderna Sars-Covid-2 Vaccination 12/13/2019, 01/10/2020, 10/19/2020, 04/17/2021   Pneumococcal Conjugate-13 03/28/2015   Pneumococcal Polysaccharide-23 02/02/2014    Tdap 05/31/2020   Zoster Recombinat (Shingrix) 05/02/2021, 02/13/2022    TDAP status: Up to date  Flu Vaccine status: Up to date  Pneumococcal vaccine status: Up to date  Covid-19 vaccine status: Completed vaccines  Qualifies for Shingles Vaccine? Yes   Zostavax completed No   Shingrix Completed?: Yes  Screening Tests Health Maintenance  Topic Date Due   COVID-19 Vaccine (5 - Moderna series) 06/12/2021   INFLUENZA VACCINE  06/03/2022   DEXA SCAN  07/19/2022   MAMMOGRAM  08/28/2022   COLONOSCOPY (Pts 45-15yr Insurance coverage will need to be confirmed)  01/22/2024   TETANUS/TDAP  05/31/2030   Pneumonia Vaccine 58+ Years old  Completed   Hepatitis C Screening  Completed   Zoster Vaccines- Shingrix  Completed   HPV VACCINES  Aged Out    Health Maintenance  Health Maintenance Due  Topic Date Due   COVID-19 Vaccine (5 - Moderna series) 06/12/2021   INFLUENZA VACCINE  06/03/2022    Colorectal cancer screening: Type of screening: Colonoscopy. Completed 01/21/2021. Repeat every 3 years  Mammogram status: Completed 08/28/2021. Repeat every year  Bone Density status: Completed 07/19/2020. Results reflect: Bone density results: OSTEOPENIA. Repeat every 2 years.  Lung Cancer Screening: (Low Dose CT Chest recommended if Age 49-80 years, 30 pack-year currently smoking OR have quit w/in 15years.) does not qualify.   Additional Screening:  Hepatitis C Screening: does qualify; Completed 02/02/2014  Vision Screening: Recommended annual ophthalmology exams for early detection of glaucoma and other disorders of the eye. Is the patient up to date with their annual eye exam?  Yes  Who is the provider or what is the name of the office in which the patient attends annual eye exams? [facility] If pt is not established with a provider, would they like to be referred to a provider to establish care? No .   Dental Screening: Recommended annual dental exams for proper oral hygiene  Community  Resource Referral / Chronic Care Management: CRR required this visit?  No   CCM required this visit?  No      Plan:     I have personally reviewed and noted the following in the patient's chart:   Medical and social history Use of alcohol, tobacco or illicit drugs  Current medications and supplements including opioid prescriptions. Patient is not currently taking opioid prescriptions. Functional ability and status Nutritional status Physical activity Advanced directives List of other physicians Hospitalizations, surgeries, and ER visits in previous 12 months Vitals Screenings to include cognitive, depression, and falls Referrals and appointments  In addition, I have reviewed and discussed with patient certain preventive protocols, quality metrics, and best practice recommendations. A written personalized care plan for preventive services as well as general preventive health recommendations were provided to patient.     Sandrea Hammond, LPN   02/18/4080   Nurse Notes: She was seen here a while back by Monia Pouch and was told her hips are bone on bone - she went to orthopedic dr and was told her joint space was good and she just has a bone spur and bursitis. She is very unhappy and disappointed with [place] for this reason. Also her husband was never called about his xray after seeing her and when he called to speak with the manager, nobody returned his call for weeks.

## 2022-06-23 NOTE — Patient Instructions (Signed)
Ms. Ohanian , Thank you for taking time to come for your Medicare Wellness Visit. I appreciate your ongoing commitment to your health goals. Please review the following plan we discussed and let me know if I can assist you in the future.   Screening recommendations/referrals: Colonoscopy: Done 01/21/2021 - Repeat in 3 years   Mammogram: Done 08/28/2021 - Repeat annually  Bone Density: Done 07/19/2020 - Repeat every 2 years  Recommended yearly ophthalmology/optometry visit for glaucoma screening and checkup Recommended yearly dental visit for hygiene and checkup  Vaccinations: Influenza vaccine: Done 07/19/2021 - Repeat annually  Pneumococcal vaccine: Done 02/02/2014 & 03/28/2015        Tdap vaccine: Done 05/31/2020 - Repeat in 10 years  Shingles vaccine: Done 05/02/2021 & 02/13/2022   Covid-19: Done 12/13/2019, 01/10/2020, 10/19/2020, 04/17/2021  Advanced directives: Advance directive discussed with you today. Even though you declined this today, please call our office should you change your mind, and we can give you the proper paperwork for you to fill out.   Conditions/risks identified: Each day, aim for 6 glasses of water, plenty of protein in your diet and try to get up and walk/ stretch every hour for 5-10 minutes at a time.    Next appointment: Follow up in one year for your annual wellness visit    Preventive Care 65 Years and Older, Female Preventive care refers to lifestyle choices and visits with your health care provider that can promote health and wellness. What does preventive care include? A yearly physical exam. This is also called an annual well check. Dental exams once or twice a year. Routine eye exams. Ask your health care provider how often you should have your eyes checked. Personal lifestyle choices, including: Daily care of your teeth and gums. Regular physical activity. Eating a healthy diet. Avoiding tobacco and drug use. Limiting alcohol use. Practicing safe sex. Taking  low-dose aspirin every day. Taking vitamin and mineral supplements as recommended by your health care provider. What happens during an annual well check? The services and screenings done by your health care provider during your annual well check will depend on your age, overall health, lifestyle risk factors, and family history of disease. Counseling  Your health care provider may ask you questions about your: Alcohol use. Tobacco use. Drug use. Emotional well-being. Home and relationship well-being. Sexual activity. Eating habits. History of falls. Memory and ability to understand (cognition). Work and work Statistician. Reproductive health. Screening  You may have the following tests or measurements: Height, weight, and BMI. Blood pressure. Lipid and cholesterol levels. These may be checked every 5 years, or more frequently if you are over 31 years old. Skin check. Lung cancer screening. You may have this screening every year starting at age 31 if you have a 30-pack-year history of smoking and currently smoke or have quit within the past 15 years. Fecal occult blood test (FOBT) of the stool. You may have this test every year starting at age 51. Flexible sigmoidoscopy or colonoscopy. You may have a sigmoidoscopy every 5 years or a colonoscopy every 10 years starting at age 28. Hepatitis C blood test. Hepatitis B blood test. Sexually transmitted disease (STD) testing. Diabetes screening. This is done by checking your blood sugar (glucose) after you have not eaten for a while (fasting). You may have this done every 1-3 years. Bone density scan. This is done to screen for osteoporosis. You may have this done starting at age 48. Mammogram. This may be done every 1-2  years. Talk to your health care provider about how often you should have regular mammograms. Talk with your health care provider about your test results, treatment options, and if necessary, the need for more tests. Vaccines   Your health care provider may recommend certain vaccines, such as: Influenza vaccine. This is recommended every year. Tetanus, diphtheria, and acellular pertussis (Tdap, Td) vaccine. You may need a Td booster every 10 years. Zoster vaccine. You may need this after age 73. Pneumococcal 13-valent conjugate (PCV13) vaccine. One dose is recommended after age 60. Pneumococcal polysaccharide (PPSV23) vaccine. One dose is recommended after age 10. Talk to your health care provider about which screenings and vaccines you need and how often you need them. This information is not intended to replace advice given to you by your health care provider. Make sure you discuss any questions you have with your health care provider. Document Released: 11/16/2015 Document Revised: 07/09/2016 Document Reviewed: 08/21/2015 Elsevier Interactive Patient Education  2017 [place] Prevention in the Home Falls can cause injuries. They can happen to people of all ages. There are many things you can do to make your home safe and to help prevent falls. What can I do on the outside of my home? Regularly fix the edges of walkways and driveways and fix any cracks. Remove anything that might make you trip as you walk through a door, such as a raised step or threshold. Trim any bushes or trees on the path to your home. Use bright outdoor lighting. Clear any walking paths of anything that might make someone trip, such as rocks or tools. Regularly check to see if handrails are loose or broken. Make sure that both sides of any steps have handrails. Any raised decks and porches should have guardrails on the edges. Have any leaves, snow, or ice cleared regularly. Use sand or salt on walking paths during winter. Clean up any spills in your garage right away. This includes oil or grease spills. What can I do in the bathroom? Use night lights. Install grab bars by the toilet and in the tub and shower. Do not use towel  bars as grab bars. Use non-skid mats or decals in the tub or shower. If you need to sit down in the shower, use a plastic, non-slip stool. Keep the floor dry. Clean up any water that spills on the floor as soon as it happens. Remove soap buildup in the tub or shower regularly. Attach bath mats securely with double-sided non-slip rug tape. Do not have throw rugs and other things on the floor that can make you trip. What can I do in the bedroom? Use night lights. Make sure that you have a light by your bed that is easy to reach. Do not use any sheets or blankets that are too big for your bed. They should not hang down onto the floor. Have a firm chair that has side arms. You can use this for support while you get dressed. Do not have throw rugs and other things on the floor that can make you trip. What can I do in the kitchen? Clean up any spills right away. Avoid walking on wet floors. Keep items that you use a lot in easy-to-reach places. If you need to reach something above you, use a strong step stool that has a grab bar. Keep electrical cords out of the way. Do not use floor polish or wax that makes floors slippery. If you must use wax, use non-skid floor  wax. Do not have throw rugs and other things on the floor that can make you trip. What can I do with my stairs? Do not leave any items on the stairs. Make sure that there are handrails on both sides of the stairs and use them. Fix handrails that are broken or loose. Make sure that handrails are as long as the stairways. Check any carpeting to make sure that it is firmly attached to the stairs. Fix any carpet that is loose or worn. Avoid having throw rugs at the top or bottom of the stairs. If you do have throw rugs, attach them to the floor with carpet tape. Make sure that you have a light switch at the top of the stairs and the bottom of the stairs. If you do not have them, ask someone to add them for you. What else can I do to help  prevent falls? Wear shoes that: Do not have high heels. Have rubber bottoms. Are comfortable and fit you well. Are closed at the toe. Do not wear sandals. If you use a stepladder: Make sure that it is fully opened. Do not climb a closed stepladder. Make sure that both sides of the stepladder are locked into place. Ask someone to hold it for you, if possible. Clearly mark and make sure that you can see: Any grab bars or handrails. First and last steps. Where the edge of each step is. Use tools that help you move around (mobility aids) if they are needed. These include: Canes. Walkers. Scooters. Crutches. Turn on the lights when you go into a dark area. Replace any light bulbs as soon as they burn out. Set up your furniture so you have a clear path. Avoid moving your furniture around. If any of your floors are uneven, fix them. If there are any pets around you, be aware of where they are. Review your medicines with your doctor. Some medicines can make you feel dizzy. This can increase your chance of falling. Ask your doctor what other things that you can do to help prevent falls. This information is not intended to replace advice given to you by your health care provider. Make sure you discuss any questions you have with your health care provider. Document Released: 08/16/2009 Document Revised: 03/27/2016 Document Reviewed: 11/24/2014 Elsevier Interactive Patient Education  2017 [employer].

## 2022-06-25 ENCOUNTER — Other Ambulatory Visit: Payer: Self-pay | Admitting: Family

## 2022-06-25 DIAGNOSIS — E876 Hypokalemia: Secondary | ICD-10-CM

## 2022-06-25 DIAGNOSIS — G609 Hereditary and idiopathic neuropathy, unspecified: Secondary | ICD-10-CM

## 2022-06-25 DIAGNOSIS — K581 Irritable bowel syndrome with constipation: Secondary | ICD-10-CM

## 2022-06-25 DIAGNOSIS — K219 Gastro-esophageal reflux disease without esophagitis: Secondary | ICD-10-CM

## 2022-08-12 ENCOUNTER — Other Ambulatory Visit: Payer: Self-pay | Admitting: Family

## 2022-08-12 DIAGNOSIS — I10 Essential (primary) hypertension: Secondary | ICD-10-CM

## 2022-09-11 ENCOUNTER — Other Ambulatory Visit: Payer: Self-pay | Admitting: Family

## 2022-09-11 DIAGNOSIS — E785 Hyperlipidemia, unspecified: Secondary | ICD-10-CM

## 2022-09-16 ENCOUNTER — Ambulatory Visit (INDEPENDENT_AMBULATORY_CARE_PROVIDER_SITE_OTHER): Payer: Medicare HMO

## 2022-09-16 ENCOUNTER — Encounter: Payer: Self-pay | Admitting: Family

## 2022-09-16 ENCOUNTER — Ambulatory Visit (INDEPENDENT_AMBULATORY_CARE_PROVIDER_SITE_OTHER): Payer: Medicare HMO | Admitting: Family

## 2022-09-16 VITALS — BP 132/66 | HR 71 | Temp 97.8°F | Ht 61.0 in | Wt 187.0 lb

## 2022-09-16 DIAGNOSIS — M8589 Other specified disorders of bone density and structure, multiple sites: Secondary | ICD-10-CM | POA: Diagnosis not present

## 2022-09-16 DIAGNOSIS — E785 Hyperlipidemia, unspecified: Secondary | ICD-10-CM | POA: Diagnosis not present

## 2022-09-16 DIAGNOSIS — M160 Bilateral primary osteoarthritis of hip: Secondary | ICD-10-CM

## 2022-09-16 DIAGNOSIS — M85852 Other specified disorders of bone density and structure, left thigh: Secondary | ICD-10-CM | POA: Diagnosis not present

## 2022-09-16 DIAGNOSIS — K5901 Slow transit constipation: Secondary | ICD-10-CM

## 2022-09-16 DIAGNOSIS — M8588 Other specified disorders of bone density and structure, other site: Secondary | ICD-10-CM

## 2022-09-16 DIAGNOSIS — M546 Pain in thoracic spine: Secondary | ICD-10-CM | POA: Diagnosis not present

## 2022-09-16 DIAGNOSIS — N816 Rectocele: Secondary | ICD-10-CM | POA: Diagnosis not present

## 2022-09-16 DIAGNOSIS — Z780 Asymptomatic menopausal state: Secondary | ICD-10-CM

## 2022-09-16 DIAGNOSIS — M85851 Other specified disorders of bone density and structure, right thigh: Secondary | ICD-10-CM | POA: Diagnosis not present

## 2022-09-16 DIAGNOSIS — I10 Essential (primary) hypertension: Secondary | ICD-10-CM

## 2022-09-16 DIAGNOSIS — E559 Vitamin D deficiency, unspecified: Secondary | ICD-10-CM

## 2022-09-16 DIAGNOSIS — K219 Gastro-esophageal reflux disease without esophagitis: Secondary | ICD-10-CM | POA: Diagnosis not present

## 2022-09-16 LAB — CBC WITH DIFFERENTIAL/PLATELET
Basophils Absolute: 0.1 10*3/uL (ref 0.0–0.2)
Basos: 1 %
EOS (ABSOLUTE): 0.2 10*3/uL (ref 0.0–0.4)
Eos: 2 %
Hematocrit: 44.4 % (ref 34.0–46.6)
Hemoglobin: 14 g/dL (ref 11.1–15.9)
Immature Grans (Abs): 0 10*3/uL (ref 0.0–0.1)
Immature Granulocytes: 0 %
Lymphocytes Absolute: 2.2 10*3/uL (ref 0.7–3.1)
Lymphs: 24 %
MCH: 27.1 pg (ref 26.6–33.0)
MCHC: 31.5 g/dL (ref 31.5–35.7)
MCV: 86 fL (ref 79–97)
Monocytes Absolute: 0.4 10*3/uL (ref 0.1–0.9)
Monocytes: 5 %
Neutrophils Absolute: 6.3 10*3/uL (ref 1.4–7.0)
Neutrophils: 68 %
Platelets: 293 10*3/uL (ref 150–450)
RBC: 5.17 x10E6/uL (ref 3.77–5.28)
RDW: 13.6 % (ref 11.7–15.4)
WBC: 9.2 10*3/uL (ref 3.4–10.8)

## 2022-09-16 LAB — CMP14+EGFR
ALT: 8 IU/L (ref 0–32)
AST: 13 IU/L (ref 0–40)
Albumin/Globulin Ratio: 1.7 (ref 1.2–2.2)
Albumin: 3.8 g/dL (ref 3.8–4.8)
Alkaline Phosphatase: 133 IU/L — ABNORMAL HIGH (ref 44–121)
BUN/Creatinine Ratio: 16 (ref 12–28)
BUN: 12 mg/dL (ref 8–27)
Bilirubin Total: 0.3 mg/dL (ref 0.0–1.2)
CO2: 26 mmol/L (ref 20–29)
Calcium: 9.5 mg/dL (ref 8.7–10.3)
Chloride: 102 mmol/L (ref 96–106)
Creatinine, Ser: 0.76 mg/dL (ref 0.57–1.00)
Globulin, Total: 2.3 g/dL (ref 1.5–4.5)
Glucose: 99 mg/dL (ref 70–99)
Potassium: 3.7 mmol/L (ref 3.5–5.2)
Sodium: 143 mmol/L (ref 134–144)
Total Protein: 6.1 g/dL (ref 6.0–8.5)
eGFR: 81 mL/min/{1.73_m2} (ref >59)

## 2022-09-16 MED ORDER — PREDNISONE 20 MG PO TABS: 40.0000 mg | ORAL_TABLET | Freq: Every day | ORAL | 0 refills | Status: AC

## 2022-09-16 MED ORDER — BACLOFEN 10 MG PO TABS: 5.0000 mg | ORAL_TABLET | Freq: Three times a day (TID) | ORAL | 0 refills | Status: DC

## 2022-09-16 NOTE — Progress Notes (Signed)
Patient returning call. Please call back

## 2022-09-16 NOTE — Progress Notes (Signed)
Subjective:    Patient ID: Charlotte Aguirre, female    DOB: May 10, 1946, 76 y.o.   MRN: [id number]  Chief Complaint  Patient presents with   Medical Management of Chronic Issues    Lower back pain strain muscle on going . 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 She is followed by GI for GERD, IBS,  diverticulosis, and abdominal pain as needed.    She is followed by Cardiologists for HTN and palpitations.   She is morbid obese with a BMI of 35 and hypertension and hyperlipidemia.  Hypertension This is a chronic problem. The current episode started more than 1 year ago. The problem has been resolved since onset. The problem is controlled. Associated symptoms include malaise/fatigue. Pertinent negatives include no peripheral edema or shortness of breath. Risk factors for coronary artery disease include dyslipidemia, obesity and sedentary lifestyle. The current treatment provides moderate improvement.  Constipation This is a chronic problem. The current episode started more than 1 year ago. The problem has been resolved since onset. Her stool frequency is 4 to 5 times per week. Associated symptoms include back pain. She has tried laxatives (linzess) for the symptoms. The treatment provided moderate relief.  Gastroesophageal Reflux She complains of belching and heartburn. This is a chronic problem. The current episode started more than 1 year ago. The problem occurs occasionally. She has tried a PPI for the symptoms. The treatment provided moderate relief.  Arthritis Presents for follow-up visit. She complains of pain and stiffness. The symptoms have been stable. Affected locations include the right MCP, left MCP, left knee and right knee. Pertinent negatives include no dysuria.  Back Pain This is a recurrent problem. The current episode started more than 1 month  ago. The problem occurs intermittently. The problem has been waxing and waning since onset. The pain is present in the thoracic spine. The quality of the pain is described as aching. The pain is at a severity of 8/10. The pain is mild. The symptoms are aggravated by standing. Pertinent negatives include no dysuria or leg pain. She has tried home exercises and NSAIDs for the symptoms. The treatment provided mild relief.      Review of Systems  Constitutional:  Positive for malaise/fatigue.  Respiratory:  Negative for shortness of breath.   Gastrointestinal:  Positive for constipation and heartburn.  Genitourinary:  Negative for dysuria.  Musculoskeletal:  Positive for arthritis, back pain and stiffness.  All other systems reviewed and are negative.      Objective:   Physical Exam Vitals reviewed.  Constitutional:      General: She is not in acute distress.    Appearance: She is well-developed. She is obese.  HENT:     Head: Normocephalic and atraumatic.     Right Ear: Tympanic membrane normal.     Left Ear: Tympanic membrane normal.  Eyes:     Pupils: Pupils are equal, round, and reactive to light.  Neck:     Thyroid: No thyromegaly.  Cardiovascular:     Rate and Rhythm: Normal rate and regular rhythm.     Heart sounds: Normal heart sounds. No murmur heard. Pulmonary:     Effort: Pulmonary effort is normal. No respiratory distress.     Breath sounds: Normal breath sounds. No wheezing.  Abdominal:  General: Bowel sounds are normal. There is no distension.     Palpations: Abdomen is soft.     Tenderness: There is no abdominal tenderness.  Musculoskeletal:        General: No tenderness. Normal range of motion.       Arms:     Cervical back: Normal range of motion and neck supple.     Comments: Pain of left thoracic area with extension  Skin:    General: Skin is warm and dry.  Neurological:     Mental Status: She is alert and oriented to person, place, and time.      Cranial Nerves: No cranial nerve deficit.     Deep Tendon Reflexes: Reflexes are normal and symmetric.  Psychiatric:        Behavior: Behavior normal.        Thought Content: Thought content normal.        Judgment: Judgment normal.       BP 132/66   Pulse 71   Temp 97.8 F (36.6 C) (Temporal)   Ht _0  (1.549 m)   Wt 187 lb (84.8 kg)   SpO2 98%   BMI 35.33 kg/m      Assessment & Plan:  Charlotte Aguirre comes in today with chief complaint of Medical Management of Chronic Issues (Lower back pain strain muscle on going . 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 Hyperlipidemia with target LDL less than 100 - CMP14+EGFR - CBC with Differential/Platelet  2. Primary osteoarthritis of both hips - CMP14+EGFR - CBC with Differential/Platelet  3. Gastroesophageal reflux disease without esophagitis - CMP14+EGFR - CBC with Differential/Platelet  4. Essential hypertension - CMP14+EGFR - CBC with Differential/Platelet  5. Morbid obesity ([place]) - CMP14+EGFR - CBC with Differential/Platelet  6. Slow transit constipation - CMP14+EGFR - CBC with Differential/Platelet  7. Rectocele - CMP14+EGFR - CBC with Differential/Platelet  8. Osteopenia of multiple sites - CMP14+EGFR - CBC with Differential/Platelet - DG [facility] DEXA  9. Vitamin D deficiency - CMP14+EGFR - CBC with Differential/Platelet  10. Acute left-sided thoracic back pain Rest Can not tolerate NSAID's, will give prednisone  Baclofen- sedation precautions discussed  - CMP14+EGFR - CBC with Differential/Platelet - DG Thoracic Spine 2 View - predniSONE (DELTASONE) 20 MG tablet; Take 2 tablets (40 mg total) by mouth daily with breakfast for 5 days.  Dispense: 10 tablet; Refill: 0 - baclofen (LIORESAL) 10 MG tablet; Take 0.5 tablets (5 mg total) by mouth 3 (three) times daily.  Dispense: 30 each;  Refill: 0  11. Post-menopause - CMP14+EGFR - DG [facility] DEXA   Labs pending Health Maintenance reviewed Diet and exercise encouraged  Follow up plan: 4 months     Evelina Dun, FNP

## 2022-09-16 NOTE — Patient Instructions (Signed)
Acute Back Pain, Adult Acute back pain is sudden and usually short-lived. It is often caused by an injury to the muscles and tissues in the back. The injury may result from: A muscle, tendon, or ligament getting overstretched or torn. Ligaments are tissues that connect bones to each other. Lifting something improperly can cause a back strain. Wear and tear (degeneration) of the spinal disks. Spinal disks are circular tissue that provide cushioning between the bones of the spine (vertebrae). Twisting motions, such as while playing sports or doing yard work. A hit to the back. Arthritis. You may have a physical exam, lab tests, and imaging tests to find the cause of your pain. Acute back pain usually goes away with rest and home care. Follow these instructions at home: Managing pain, stiffness, and swelling Take over-the-counter and prescription medicines only as told by your health care provider. Treatment may include medicines for pain and inflammation that are taken by mouth or applied to the skin, or muscle relaxants. Your health care provider may recommend applying ice during the first 24-48 hours after your pain starts. To do this: Put ice in a plastic bag. Place a towel between your skin and the bag. Leave the ice on for 20 minutes, 2-3 times a day. Remove the ice if your skin turns bright red. This is very important. If you cannot feel pain, heat, or cold, you have a greater risk of damage to the area. If directed, apply heat to the affected area as often as told by your health care provider. Use the heat source that your health care provider recommends, such as a moist heat pack or a heating pad. Place a towel between your skin and the heat source. Leave the heat on for 20-30 minutes. Remove the heat if your skin turns bright red. This is especially important if you are unable to feel pain, heat, or cold. You have a greater risk of getting burned. Activity  Do not stay in bed. Staying in  bed for more than 1-2 days can delay your recovery. Sit up and stand up straight. Avoid leaning forward when you sit or hunching over when you stand. If you work at a desk, sit close to it so you do not need to lean over. Keep your chin tucked in. Keep your neck drawn back, and keep your elbows bent at a 90-degree angle (right angle). Sit high and close to the steering wheel when you drive. Add lower back (lumbar) support to your car seat, if needed. Take short walks on even surfaces as soon as you are able. Try to increase the length of time you walk each day. Do not sit, drive, or stand in one place for more than 30 minutes at a time. Sitting or standing for long periods of time can put stress on your back. Do not drive or use heavy machinery while taking prescription pain medicine. Use proper lifting techniques. When you bend and lift, use positions that put less stress on your back: Bend your knees. Keep the load close to your body. Avoid twisting. Exercise regularly as told by your health care provider. Exercising helps your back heal faster and helps prevent back injuries by keeping muscles strong and flexible. Work with a physical therapist to make a safe exercise program, as recommended by your health care provider. Do any exercises as told by your physical therapist. Lifestyle Maintain a healthy weight. Extra weight puts stress on your back and makes it difficult to have good   posture. Avoid activities or situations that make you feel anxious or stressed. Stress and anxiety increase muscle tension and can make back pain worse. Learn ways to manage anxiety and stress, such as through exercise. General instructions Sleep on a firm mattress in a comfortable position. Try lying on your side with your knees slightly bent. If you lie on your back, put a pillow under your knees. Keep your head and neck in a straight line with your spine (neutral position) when using electronic equipment like  smartphones or pads. To do this: Raise your smartphone or pad to look at it instead of bending your head or neck to look down. Put the smartphone or pad at the level of your face while looking at the screen. Follow your treatment plan as told by your health care provider. This may include: Cognitive or behavioral therapy. Acupuncture or massage therapy. Meditation or yoga. Contact a health care provider if: You have pain that is not relieved with rest or medicine. You have increasing pain going down into your legs or buttocks. Your pain does not improve after 2 weeks. You have pain at night. You lose weight without trying. You have a fever or chills. You develop nausea or vomiting. You develop abdominal pain. Get help right away if: You develop new bowel or bladder control problems. You have unusual weakness or numbness in your arms or legs. You feel faint. These symptoms may represent a serious problem that is an emergency. Do not wait to see if the symptoms will go away. Get medical help right away. Call your local emergency services (911 in the [place] Do not drive yourself to the hospital. Summary Acute back pain is sudden and usually short-lived. Use proper lifting techniques. When you bend and lift, use positions that put less stress on your back. Take over-the-counter and prescription medicines only as told by your health care provider, and apply heat or ice as told. This information is not intended to replace advice given to you by your health care provider. Make sure you discuss any questions you have with your health care provider. Document Revised: 01/11/2021 Document Reviewed: 01/11/2021 Elsevier Patient Education  2023 [employer]  

## 2022-09-23 ENCOUNTER — Other Ambulatory Visit: Payer: Self-pay | Admitting: Family

## 2022-09-23 DIAGNOSIS — Z1231 Encounter for screening mammogram for malignant neoplasm of breast: Secondary | ICD-10-CM

## 2022-09-29 ENCOUNTER — Ambulatory Visit
Admission: RE | Admit: 2022-09-29 | Discharge: 2022-09-29 | Disposition: A | Discharge status: Home / Self Care | Payer: Medicare HMO | Source: Ambulatory Visit | Attending: Family | Admitting: Family

## 2022-09-29 DIAGNOSIS — Z1231 Encounter for screening mammogram for malignant neoplasm of breast: Secondary | ICD-10-CM

## 2022-10-01 ENCOUNTER — Other Ambulatory Visit: Payer: Self-pay | Admitting: Family

## 2022-10-01 DIAGNOSIS — M546 Pain in thoracic spine: Secondary | ICD-10-CM

## 2022-10-01 DIAGNOSIS — K219 Gastro-esophageal reflux disease without esophagitis: Secondary | ICD-10-CM

## 2022-10-01 DIAGNOSIS — E876 Hypokalemia: Secondary | ICD-10-CM

## 2022-10-01 DIAGNOSIS — E785 Hyperlipidemia, unspecified: Secondary | ICD-10-CM

## 2022-10-02 NOTE — Telephone Encounter (Signed)
Last OV 09/16/22. Last RF 09/16/22.  Ok to fill?

## 2022-10-16 ENCOUNTER — Other Ambulatory Visit: Payer: Self-pay | Admitting: Family

## 2022-10-16 DIAGNOSIS — I10 Essential (primary) hypertension: Secondary | ICD-10-CM

## 2022-10-16 DIAGNOSIS — G609 Hereditary and idiopathic neuropathy, unspecified: Secondary | ICD-10-CM

## 2022-12-12 ENCOUNTER — Other Ambulatory Visit: Payer: Self-pay | Admitting: Cardiology

## 2022-12-12 ENCOUNTER — Other Ambulatory Visit: Payer: Self-pay | Admitting: Family

## 2022-12-12 DIAGNOSIS — K219 Gastro-esophageal reflux disease without esophagitis: Secondary | ICD-10-CM

## 2022-12-12 DIAGNOSIS — M546 Pain in thoracic spine: Secondary | ICD-10-CM

## 2022-12-12 DIAGNOSIS — E876 Hypokalemia: Secondary | ICD-10-CM

## 2022-12-21 ENCOUNTER — Other Ambulatory Visit: Payer: Self-pay

## 2022-12-21 ENCOUNTER — Emergency Department (HOSPITAL_COMMUNITY)
Admission: EM | Admit: 2022-12-21 | Discharge: 2022-12-21 | Disposition: A | Discharge status: Home / Self Care | Payer: Medicare HMO | Attending: Emergency Medicine | Admitting: Emergency Medicine

## 2022-12-21 ENCOUNTER — Encounter (HOSPITAL_COMMUNITY): Payer: Self-pay | Admitting: *Deleted

## 2022-12-21 ENCOUNTER — Emergency Department (HOSPITAL_COMMUNITY): Payer: Medicare HMO

## 2022-12-21 DIAGNOSIS — R519 Headache, unspecified: Secondary | ICD-10-CM | POA: Insufficient documentation

## 2022-12-21 MED ORDER — DEXAMETHASONE SODIUM PHOSPHATE 10 MG/ML IJ SOLN
10.0000 mg | Freq: Once | INTRAMUSCULAR | Status: AC
  Administered 2022-12-21: 10 mg via INTRAMUSCULAR
  Filled 2022-12-21: qty 1

## 2022-12-21 NOTE — Discharge Instructions (Signed)
As discussed, with your ongoing headaches and your new head pain it is importantly follow-up with one of our neurology colleagues.  Please call tomorrow for an appointment. You have received an anti-inflammatory injection today.  This in addition to your pain medication regimen of gabapentin, Tylenol should control your symptoms, though you may require additional dosing of Tylenol.  Return here for concerning changes in your condition.

## 2022-12-21 NOTE — ED Provider Notes (Signed)
[facility] Provider Note   CSN: [id number] Arrival date & time: 12/21/22  [id number]     History  Chief Complaint  Patient presents with   Headache    Charlotte Aguirre is a 77 y.o. female.  HPI Patient presents with concern for head pain.  She differentiates this from headache which she does have some frequency.  Over the past weeks, and severely today she has had sharp pain in the left lateral and parietal region of her head.  No vision changes, no weakness, no nausea, no vomiting.  No medication taken for relief.  Pain is somewhat subsided currently.  She is here with her husband assists with the history.    Home Medications Prior to Admission medications   Medication Sig Start Date End Date Taking? Authorizing Provider  baclofen (LIORESAL) 10 MG tablet TAKE 0.5 TABLETS (5 MG TOTAL) BY MOUTH 3 (THREE) TIMES DAILY. 12/12/22   Evelina Dun A, FNP  Cholecalciferol (VITAMIN D-3 PO) Take 1 tablet by mouth daily. 2000 units    [provider]  gabapentin (NEURONTIN) 300 MG capsule TAKE 1 CAPSULE BY MOUTH THREE TIMES A DAY 10/16/22   Hawks, Christy A, FNP  hydrochlorothiazide (HYDRODIURIL) 25 MG tablet TAKE 1 TABLET (25 MG TOTAL) BY MOUTH DAILY. 10/16/22   Sharion Balloon, FNP  LINZESS 72 MCG capsule TAKE 1 CAPSULE BY MOUTH DAILY BEFORE BREAKFAST. 06/25/22   Sharion Balloon, FNP  Multiple Vitamins-Minerals (PRESERVISION AREDS 2 PO) daily.    [provider]  ondansetron (ZOFRAN) 4 MG tablet Take 1 tablet (4 mg total) by mouth every 8 (eight) hours as needed for nausea or vomiting. 05/16/22   Evelina Dun A, FNP  pantoprazole (PROTONIX) 40 MG tablet TAKE 1 TABLET BY MOUTH EVERY DAY 12/12/22   Evelina Dun A, FNP  potassium chloride (KLOR-CON) 10 MEQ tablet TAKE 1 TABLET BY MOUTH EVERY DAY 12/12/22   Evelina Dun A, FNP  propranolol (INDERAL) 10 MG tablet TAKE 1 TABLET BY MOUTH THREE TIMES A DAY 12/12/22   Minus Breeding, MD   simvastatin (ZOCOR) 40 MG tablet TAKE 1 TABLET BY MOUTH EVERY DAY 10/02/22   Evelina Dun A, FNP      Allergies    Aspirin, Bactrim [sulfamethoxazole-trimethoprim], and Topiramate    Review of Systems   Review of Systems  All other systems reviewed and are negative.   Physical Exam Updated Vital Signs BP (!) 178/71 (BP Location: Right Arm)   Pulse 94   Temp 98.1 F (36.7 C) (Oral)   Resp 20   SpO2 100%  Physical Exam Vitals and nursing note reviewed.  Constitutional:      General: She is not in acute distress.    Appearance: She is well-developed.  HENT:     Head: Normocephalic and atraumatic.  Eyes:     Conjunctiva/sclera: Conjunctivae normal.  Cardiovascular:     Rate and Rhythm: Normal rate and regular rhythm.  Pulmonary:     Effort: Pulmonary effort is normal. No respiratory distress.     Breath sounds: Normal breath sounds. No stridor.  Abdominal:     General: There is no distension.  Skin:    General: Skin is warm and dry.     Comments: No rash or skin lesions  Neurological:     Mental Status: She is alert and oriented to person, place, and time.     Cranial Nerves: No cranial nerve deficit.     Comments: No  focal deficits, age-appropriate atrophy, speech clear face symmetric  Psychiatric:        Mood and Affect: Mood normal.     ED Results / Procedures / Treatments   Labs (all labs ordered are listed, but only abnormal results are displayed) Labs Reviewed - No data to display  EKG None  Radiology CT Head Wo Contrast  Result Date: 12/21/2022 CLINICAL DATA:  Headache, new onset EXAM: CT HEAD WITHOUT CONTRAST TECHNIQUE: Contiguous axial images were obtained from the base of the skull through the vertex without intravenous contrast. RADIATION DOSE REDUCTION: This exam was performed according to the departmental dose-optimization program which includes automated exposure control, adjustment of the mA and/or kV according to patient size and/or use of  iterative reconstruction technique. COMPARISON:  Brain MRI 06/15/2015 FINDINGS: Brain: No evidence of acute infarction, hemorrhage, hydrocephalus, extra-axial collection or mass lesion/mass effect. Vascular: No hyperdense vessel or unexpected calcification. Skull: Normal. Negative for fracture or focal lesion. Sinuses/Orbits: No acute finding. IMPRESSION: Negative head CT.  No explanation for headache. Electronically Signed   By: Jorje Guild M.D.   On: 12/21/2022 08:22    Procedures Procedures    Medications Ordered in ED Medications  dexamethasone (DECADRON) injection 10 mg (has no administration in time range)    ED Course/ Medical Decision Making/ A&P                             Medical Decision Making Patient with history of headaches, fibromyalgia, currently only using Tylenol for pain control presents with head pain distinct from her typical headaches and other pain.  Differential including intracranial mass, neuropathy, trigeminal neuralgia, cluster headache all considered.  CT scan ordered, meds offered.  Amount and/or Complexity of Data Reviewed Independent Historian: spouse External Data Reviewed: notes. Radiology: ordered and independent interpretation performed. Decision-making details documented in ED Course.  Risk OTC drugs. Prescription drug management. Decision regarding hospitalization.   9:06 AM Patient in no distress, awake, alert, hemodynamically unremarkable.  She now notes that she has previously seen a neurologist that this was years ago.  With no neurocomplaints, no focal deficits, improved head pain, in the context of a patient presenting with fibromyalgia, headaches, some suspicion for trigeminal neuralgia versus cluster headache phenomena, but patient is appropriate for discharge with reassuring CT scan which I reviewed, discussed with the patient and her husband.  She did receive a dose of intramuscular anti-inflammatory prior to  discharge.        Final Clinical Impression(s) / ED Diagnoses Final diagnoses:  Acute nonintractable headache, unspecified headache type    Rx / DC Orders ED Discharge Orders     None         Carmin Muskrat, MD 12/21/22 [phone]

## 2022-12-21 NOTE — ED Triage Notes (Signed)
Pt c/o intermittent headache x 2 weeks; pt states the pain is to her left side of the head and over her left eye

## 2022-12-23 ENCOUNTER — Telehealth: Payer: Self-pay | Admitting: *Deleted

## 2022-12-23 ENCOUNTER — Encounter: Payer: Self-pay | Admitting: *Deleted

## 2022-12-23 ENCOUNTER — Encounter: Payer: Self-pay | Admitting: Neurology

## 2022-12-23 NOTE — Progress Notes (Unsigned)
NEUROLOGY CONSULTATION NOTE  Charlotte Aguirre MRN: [id number] DOB: 10-14-1946  Referring provider: Carmin Muskrat, MD (ED referral) Primary care provider: Evelina Dun, FNP  Reason for consult:  headache  Assessment/Plan:   Suspect primary stabbing headache - may have had a flare up while off gabapentin.  Continue gabapentin 330m in morning and 6089mat night Continue melatonin 54m93mt bedtime.  If pain does not continue to improve, she may increase dose to 15m37m bedtime. Follow up in 4 to 5 monts.   Subjective:  Charlotte Aguirre 76 y1r old female with HTN, HLD, IBS and fibromyalgia who presents for headaches.  History supplemented by ED note.  She is accompanied by her husband who also assists with history.   Since 2015. She has a focal stabbing headache on the top of the left side of her head. It is typically a single paroxysmal stabbing.  Sometimes it is followed by a sharp and burning pain down the left side of her face to the left eye up to the left nasolabial fold.  It is not triggered by anything, such as chewing, talking, brushing her teeth, or evening. There is no associated symptoms such as nausea, vomiting, photophobia, phonophobia, visual disturbance, or autonomic symptoms. It is not relieved by anything. MRI of brain with and without contrats on 12/22/2013 showed mild chronic small vessel ischemic changes and left parietal developmental venous anomaly but no acute intracranial abnormality.  Normally it would occur once or twice a day.  On 12/21/2022, she woke up with continuous stabbing pain in that region.  She went to the ED where CT head personally reviewed was unremarkable.  For many years she has been taking gabapentin for fibromyalgia.  She had been out of it for a few days but has since resumed taking it.  She has history of difficulty sleeping and started taking 54mg 39matonin at night a couple of nights ago.   Migraine with aura She has history of migraines, which are  different from this. They present with visual auras with field cut, and sparkling lights. It is associated with nausea, photophobia, and phonophobia.   Past NSAIDS/analgesics:  tramadol Past abortive triptans:  none Past abortive ergotamine:  none Past muscle relaxants:  Flexeril, Robaxin Past anti-emetic:  none Past antihypertensive medications:  metoprolol Past antidepressant medications:  none Past anticonvulsant medications:  carbamazepine, oxcarbazepine, topiramate   Current NSAIDS/analgesics:  none Current triptans:  none Current ergotamine:  none Current anti-emetic:  Zofran 4mg C33ment muscle relaxants:  baclofen 54mg TI37murrent Antihypertensive medications:  propranolol 15mg [id number] Current Antidepressant medications:  none Current Anticonvulsant medications:  gabapentin 300mg TID154mrent anti-CGRP:  none  PAST MEDICAL HISTORY: Past Medical History:  Diagnosis Date   Anxiety    situational anxiety/stress   Arthritis    thumb and fingers   Cataract    bilateral -LEFT sx completed   Fibromyalgia    GERD (gastroesophageal reflux disease)    on meds   Hyperlipidemia    on meds   Hypertension    on meds   IBS (irritable bowel syndrome)    Memory changes    Osteopenia    Peripheral neuropathy    bilateral feet   Vitamin D insufficiency     PAST SURGICAL HISTORY: Past Surgical History:  Procedure Laterality Date   ABDOMINAL HYSTERECTOMY     uterus frist and 10 years later both ovaries removed   APPENDECTOMY     BLADDER SURGERY  bladder tack   BREAST EXCISIONAL BIOPSY     CATARACT EXTRACTION Left 01/23/2022   CATARACT EXTRACTION Right    CHOLECYSTECTOMY     COLONOSCOPY N/A 03/22/2014   Procedure: COLONOSCOPY;  Surgeon: Rogene Houston, MD;  Location: [facility];  Service: Endoscopy;  Laterality: N/A;  930   OOPHORECTOMY     TOE SURGERY Left    2nd toe on RIGHT foot w/pins   WISDOM TOOTH EXTRACTION      MEDICATIONS: Current Outpatient  Medications on File Prior to Visit  Medication Sig Dispense Refill   baclofen (LIORESAL) 10 MG tablet TAKE 0.5 TABLETS (5 MG TOTAL) BY MOUTH 3 (THREE) TIMES DAILY. 120 tablet 1   Cholecalciferol (VITAMIN D-3 PO) Take 1 tablet by mouth daily. 2000 units     gabapentin (NEURONTIN) 300 MG capsule TAKE 1 CAPSULE BY MOUTH THREE TIMES A DAY 90 capsule 5   hydrochlorothiazide (HYDRODIURIL) 25 MG tablet TAKE 1 TABLET (25 MG TOTAL) BY MOUTH DAILY. 90 tablet 1   LINZESS 72 MCG capsule TAKE 1 CAPSULE BY MOUTH DAILY BEFORE BREAKFAST. 90 capsule 0   Multiple Vitamins-Minerals (PRESERVISION AREDS 2 PO) daily.     ondansetron (ZOFRAN) 4 MG tablet Take 1 tablet (4 mg total) by mouth every 8 (eight) hours as needed for nausea or vomiting. 20 tablet 2   pantoprazole (PROTONIX) 40 MG tablet TAKE 1 TABLET BY MOUTH EVERY DAY 90 tablet 1   potassium chloride (KLOR-CON) 10 MEQ tablet TAKE 1 TABLET BY MOUTH EVERY DAY 90 tablet 1   propranolol (INDERAL) 10 MG tablet TAKE 1 TABLET BY MOUTH THREE TIMES A DAY 270 tablet 3   simvastatin (ZOCOR) 40 MG tablet TAKE 1 TABLET BY MOUTH EVERY DAY 90 tablet 0   No current facility-administered medications on file prior to visit.    ALLERGIES: Allergies  Allergen Reactions   Aspirin     Must take EC, prior history of stomach ulcer. Other reaction(s): Angioedema (ALLERGY/intolerance) Must take EC, prior history of stomach ulcer.   Bactrim [Sulfamethoxazole-Trimethoprim] Nausea And Vomiting   Topiramate Other (See Comments)    hallucinations    FAMILY HISTORY: Family History  Problem Relation Age of Onset   Heart failure Mother    Heart failure Father    Heart attack Father    Glaucoma Father    Heart failure Sister    Heart attack Sister    Diabetes Sister    Obesity Sister    COPD Sister        from 47nd hand smoke - never a smoker   Osteoporosis Sister    Colon polyps Sister    Dementia Brother    Heart attack Brother    Heart failure Brother    Heart  attack Brother    Cancer Brother        LYMPHOMA   Heart attack Brother    Stomach cancer Brother        lymphoma   Cancer Brother        lymphoma-hip   Thyroid disease Son    Throat cancer Son    Tongue cancer Son    Colon cancer Neg Hx    Esophageal cancer Neg Hx    Rectal cancer Neg Hx    Breast cancer Neg Hx     Objective:  Blood pressure 120/60, pulse 81, height 5' 1"$  (1.549 m), weight 192 lb 12.8 oz (87.5 kg), SpO2 96 %. General: No acute distress.  Patient appears well-groomed.  Head:  Normocephalic/atraumatic Eyes:  fundi examined but not visualized Neck: supple, no paraspinal tenderness, full range of motion Back: No paraspinal tenderness Heart: regular rate and rhythm Lungs: Clear to auscultation bilaterally. Vascular: No carotid bruits. Neurological Exam: Mental status: alert and oriented to person, place, and time, speech fluent and not dysarthric, language intact. Cranial nerves: CN I: not tested CN II: pupils equal, round and reactive to light, visual fields intact CN III, IV, VI:  full range of motion, no nystagmus, no ptosis CN V: facial sensation intact. CN VII: upper and lower face symmetric CN VIII: hearing intact CN IX, X: gag intact, uvula midline CN XI: sternocleidomastoid and trapezius muscles intact CN XII: tongue midline Bulk & Tone: normal, no fasciculations. Motor:  muscle strength 5/5 throughout Sensation:  Pinprick, temperature and vibratory sensation intact. Deep Tendon Reflexes:  2+ throughout,  toes downgoing.   Finger to nose testing:  Without dysmetria.   Heel to shin:  Without dysmetria.   Gait:  Normal station and stride.  Romberg negative.    Thank you for allowing me to take part in the care of this patient.  Metta Clines, DO  CC: Evelina Dun, FNP

## 2022-12-23 NOTE — Progress Notes (Signed)
  Care Coordination  Note  12/23/2022 Name: KATLIN GODBOUT MRN: [id number] DOB: 05/05/1946  Chauncey Cruel Steckman is a 77 y.o. year old primary care patient of Sharion Balloon, FNP.   Follow up plan: Hospital Follow Up appointment scheduled with Sharion Balloon, FNP, on 12/26/22 at 12:00 PM.   [place]  Direct Dial: [phone]

## 2022-12-23 NOTE — Transitions of Care (Post Inpatient/ED Visit) (Signed)
   12/23/2022  Name: Charlotte Aguirre MRN: [id number] DOB: 04/03/1946  Today's TOC FU Call Status: Today's TOC FU Call Status:: Successful TOC FU Call Competed TOC FU Call Complete Date: 12/23/22  Transition Care Management Follow-up Telephone Call Date of Discharge: 12/21/22 Discharge Facility: [name] Type of Discharge: Emergency Department How have you been since you were released from the hospital?: Better Any questions or concerns?: No  Items Reviewed: Did you receive and understand the discharge instructions provided?: Yes Medications obtained and verified?: Yes (Medications Reviewed) (May need to increase gabapentin. Will talk with PCP.) Any new allergies since your discharge?: No Dietary orders reviewed?: NA  Home Care and Equipment/Supplies: [name] Ordered?: No Any new equipment or medical supplies ordered?: No  Functional Questionnaire: Do you need assistance with bathing/showering or dressing?: No Do you need assistance with meal preparation?: No Do you need assistance with eating?: No Do you have difficulty maintaining continence: No Do you need assistance with getting out of bed/getting out of a chair/moving?: No Do you have difficulty managing or taking your medications?: No  Folllow up appointments reviewed: PCP Follow-up appointment confirmed?: Yes (collaborated with scheduling care guides to set up appt with PCP office.) Date of PCP follow-up appointment?: 12/26/22 Follow-up Provider: Evelina Aguirre, [facility] Follow-up appointment confirmed?: No Reason Specialist Follow-Up Not Confirmed: Patient has Specialist Provider Number and will Call for Appointment (Provided with telephone number for [facility], [phone]. Patient was recommended to see Charlotte Aguirre, but he is retiring.) Do you need transportation to your follow-up appointment?: No Do you understand care options if your condition(s) worsen?: Yes-patient  verbalized understanding  SDOH Interventions Today    Flowsheet Row Most Recent Value  SDOH Interventions   Transportation Interventions Intervention Not Indicated  Financial Strain Interventions Intervention Not Indicated      Chong Sicilian, BSN, RN-BC RN Care Coordinator [place]: [phone] Main #: [phone]

## 2022-12-24 ENCOUNTER — Ambulatory Visit (INDEPENDENT_AMBULATORY_CARE_PROVIDER_SITE_OTHER): Payer: Medicare HMO | Admitting: Neurology

## 2022-12-24 ENCOUNTER — Encounter: Payer: Self-pay | Admitting: Neurology

## 2022-12-24 VITALS — BP 120/60 | HR 81 | Ht 61.0 in | Wt 192.8 lb

## 2022-12-24 DIAGNOSIS — G4485 Primary stabbing headache: Secondary | ICD-10-CM

## 2022-12-24 NOTE — Patient Instructions (Signed)
I think you have PRIMARY STABBING HEADACHE: Both gabapentin and melatonin are used to treat it. Continue gabapentin 387m in morning and 6060mat night and melatonin 8m61mt bedtime.  If headaches continue, you may increase melatonin to 64m78m bedtime Follow up 4 to 5 months.

## 2022-12-26 ENCOUNTER — Encounter: Payer: Self-pay | Admitting: Family

## 2022-12-26 ENCOUNTER — Ambulatory Visit (INDEPENDENT_AMBULATORY_CARE_PROVIDER_SITE_OTHER): Payer: Medicare HMO | Admitting: Family

## 2022-12-26 VITALS — BP 122/75 | HR 78 | Temp 97.6°F | Ht 61.0 in | Wt 190.4 lb

## 2022-12-26 DIAGNOSIS — Z09 Encounter for follow-up examination after completed treatment for conditions other than malignant neoplasm: Secondary | ICD-10-CM

## 2022-12-26 DIAGNOSIS — I10 Essential (primary) hypertension: Secondary | ICD-10-CM | POA: Diagnosis not present

## 2022-12-26 DIAGNOSIS — G4485 Primary stabbing headache: Secondary | ICD-10-CM | POA: Diagnosis not present

## 2022-12-26 NOTE — Progress Notes (Addendum)
Subjective:    Patient ID: Charlotte Aguirre, female    DOB: 1946/06/17, 77 y.o.   MRN: [id number]  Chief Complaint  Patient presents with   Hospitalization Follow-up   PT presents to the office today for hospital follow up. She went to the ED on 12/21/22 with acute headache. Had a negative CT scan. She saw a Neurologists and diagnosed with primary stabbing headache. She was told to continue gabapentin 300 mg in AM and 600 mg bedtime and melatonin 5 mg at bedtime. Has follow up with Neurologists in 4 months.  Reports her last headache was two days ago. Resolved after taking tylenol.  Headache  This is a chronic problem. The current episode started more than 1 month ago. The problem occurs intermittently. The problem has been waxing and waning. The pain is located in the Left unilateral region. The pain does not radiate. The pain quality is similar to prior headaches. The quality of the pain is described as stabbing. The pain is at a severity of 10/10. The pain is moderate. Pertinent negatives include no nausea, neck pain, phonophobia, photophobia or vomiting. Exacerbated by: not sleeping. Treatments tried: gabapentin. The treatment provided moderate relief. Her past medical history is significant for hypertension.  Hypertension This is a chronic problem. The current episode started more than 1 year ago. The problem has been resolved since onset. The problem is controlled. Associated symptoms include headaches. Pertinent negatives include no neck pain, peripheral edema or shortness of breath. The current treatment provides moderate improvement.      Review of Systems  Eyes:  Negative for photophobia.  Respiratory:  Negative for shortness of breath.   Gastrointestinal:  Negative for nausea and vomiting.  Musculoskeletal:  Negative for neck pain.  Neurological:  Positive for headaches.  All other systems reviewed and are negative.      Objective:   Physical Exam Vitals reviewed.   Constitutional:      General: She is not in acute distress.    Appearance: She is well-developed.  HENT:     Head: Normocephalic and atraumatic.     Right Ear: Tympanic membrane normal.     Left Ear: Tympanic membrane normal.  Eyes:     Pupils: Pupils are equal, round, and reactive to light.  Neck:     Thyroid: No thyromegaly.  Cardiovascular:     Rate and Rhythm: Normal rate and regular rhythm.     Heart sounds: Normal heart sounds. No murmur heard. Pulmonary:     Effort: Pulmonary effort is normal. No respiratory distress.     Breath sounds: Normal breath sounds. No wheezing.  Abdominal:     General: Bowel sounds are normal. There is no distension.     Palpations: Abdomen is soft.     Tenderness: There is no abdominal tenderness.  Musculoskeletal:        General: No tenderness. Normal range of motion.     Cervical back: Normal range of motion and neck supple.  Skin:    General: Skin is warm and dry.  Neurological:     Mental Status: She is alert and oriented to person, place, and time.     Cranial Nerves: No cranial nerve deficit.     Deep Tendon Reflexes: Reflexes are normal and symmetric.  Psychiatric:        Behavior: Behavior normal.        Thought Content: Thought content normal.        Judgment: Judgment normal.  BP 122/75   Pulse 78   Temp 97.6 F (36.4 C) (Temporal)   Ht '5\' 1"'$  (1.549 m)   Wt 190 lb 6.4 oz (86.4 kg)   SpO2 96%   BMI 35.98 kg/m        Assessment & Plan:  Charlotte Aguirre comes in today with chief complaint of Hospitalization Follow-up   Diagnosis and orders addressed:  1. Primary stabbing headache  2. Hospital discharge follow-up  3. Essential hypertension    Continue gabapentin and Melatonin  Keep follow up with Neurologists  Hospital notes reviewed  Follow up if symptoms worsen or do not improve    Evelina Dun, FNP

## 2022-12-26 NOTE — Patient Instructions (Signed)
General Headache Without Cause A headache is pain or discomfort felt around the head or neck area. There are many causes and types of headaches. A few common types include: Tension headaches. Migraine headaches. Cluster headaches. Chronic daily headaches. Sometimes, the specific cause of a headache may not be found. Follow these instructions at home: Watch your condition for any changes. Let your health care provider know about them. Take these steps to help with your condition: Managing pain     Take over-the-counter and prescription medicines only as told by your health care provider. Treatment may include medicines for pain that are taken by mouth or applied to the skin. Lie down in a dark, quiet room when you have a headache. Keep lights dim if bright lights bother you or make your headaches worse. If directed, put ice on your head and neck area: Put ice in a plastic bag. Place a towel between your skin and the bag. Leave the ice on for 20 minutes, 2-3 times per day. Remove the ice if your skin turns bright red. This is very important. If you cannot feel pain, heat, or cold, you have a greater risk of damage to the area. If directed, apply heat to the affected area. Use the heat source that your health care provider recommends, such as a moist heat pack or a heating pad. Place a towel between your skin and the heat source. Leave the heat on for 20-30 minutes. Remove the heat if your skin turns bright red. This is especially important if you are unable to feel pain, heat, or cold. You have a greater risk of getting burned. Eating and drinking Eat meals on a regular schedule. If you drink alcohol: Limit how much you have to: 0-1 drink a day for women who are not pregnant. 0-2 drinks a day for men. Know how much alcohol is in a drink. In the [place] one drink equals one 12 oz bottle of beer (355 mL), one 5 oz glass of wine (148 mL), or one 1 oz glass of hard liquor (44 mL). Stop  drinking caffeine, or decrease the amount of caffeine you drink. Drink enough fluid to keep your urine pale yellow. General instructions  Keep a headache journal to help find out what may trigger your headaches. For example, write down: What you eat and drink. How much sleep you get. Any change to your diet or medicines. Try massage or other relaxation techniques. Limit stress. Sit up straight, and do not tense your muscles. Do not use any products that contain nicotine or tobacco. These products include cigarettes, chewing tobacco, and vaping devices, such as e-cigarettes. If you need help quitting, ask your health care provider. Exercise regularly as told by your health care provider. Sleep on a regular schedule. Get 7-9 hours of sleep each night, or the amount recommended by your health care provider. Keep all follow-up visits. This is important. Contact a health care provider if: Medicine does not help your symptoms. You have a headache that is different from your usual headache. You have nausea or you vomit. You have a fever. Get help right away if: Your headache: Becomes severe quickly. Gets worse after moderate to intense physical activity. You have any of these symptoms: Repeated vomiting. Pain or stiffness in your neck. Changes to your vision. Pain in an eye or ear. Problems with speech. Muscular weakness or loss of muscle control. Loss of balance or coordination. You feel faint or pass out. You have confusion. You have   a seizure. These symptoms may represent a serious problem that is an emergency. Do not wait to see if the symptoms will go away. Get medical help right away. Call your local emergency services (911 in the [place] Do not drive yourself to the hospital. Summary A headache is pain or discomfort felt around the head or neck area. There are many causes and types of headaches. In some cases, the cause may not be found. Keep a headache journal to help find out  what may trigger your headaches. Watch your condition for any changes. Let your health care provider know about them. Contact a health care provider if you have a headache that is different from the usual headache, or if your symptoms are not helped by medicine. Get help right away if your headache becomes severe, you vomit, you have a loss of vision, you lose your balance, or you have a seizure. This information is not intended to replace advice given to you by your health care provider. Make sure you discuss any questions you have with your health care provider. Document Revised: 03/20/2021 Document Reviewed: 03/20/2021 Elsevier Patient Education  2023 [employer]  

## 2023-01-12 ENCOUNTER — Ambulatory Visit (HOSPITAL_COMMUNITY)
Admission: RE | Admit: 2023-01-12 | Discharge: 2023-01-12 | Disposition: A | Discharge status: Home / Self Care | Payer: Medicare HMO | Source: Ambulatory Visit | Attending: Family Medicine | Admitting: Family Medicine

## 2023-01-12 ENCOUNTER — Encounter: Payer: Self-pay | Admitting: Family Medicine

## 2023-01-12 ENCOUNTER — Ambulatory Visit (INDEPENDENT_AMBULATORY_CARE_PROVIDER_SITE_OTHER): Payer: Medicare HMO | Admitting: Family Medicine

## 2023-01-12 VITALS — BP 131/69 | HR 78 | Temp 97.8°F | Ht 61.0 in | Wt 192.0 lb

## 2023-01-12 DIAGNOSIS — I700 Atherosclerosis of aorta: Secondary | ICD-10-CM | POA: Diagnosis not present

## 2023-01-12 DIAGNOSIS — R1031 Right lower quadrant pain: Secondary | ICD-10-CM | POA: Insufficient documentation

## 2023-01-12 DIAGNOSIS — R1030 Lower abdominal pain, unspecified: Secondary | ICD-10-CM | POA: Diagnosis not present

## 2023-01-12 DIAGNOSIS — K869 Disease of pancreas, unspecified: Secondary | ICD-10-CM | POA: Diagnosis not present

## 2023-01-12 DIAGNOSIS — K5730 Diverticulosis of large intestine without perforation or abscess without bleeding: Secondary | ICD-10-CM | POA: Diagnosis not present

## 2023-01-12 DIAGNOSIS — K7689 Other specified diseases of liver: Secondary | ICD-10-CM | POA: Diagnosis not present

## 2023-01-12 LAB — MICROSCOPIC EXAMINATION
RBC, Urine: NONE SEEN /HPF (ref 0–2)
Renal Epithel, UA: NONE SEEN /HPF

## 2023-01-12 LAB — URINALYSIS, COMPLETE
Bilirubin, UA: NEGATIVE
Glucose, UA: NEGATIVE
Leukocytes,UA: NEGATIVE
Nitrite, UA: NEGATIVE
Protein,UA: NEGATIVE
RBC, UA: NEGATIVE
Specific Gravity, UA: 1.02 (ref 1.005–1.030)
Urobilinogen, Ur: 0.2 mg/dL (ref 0.2–1.0)
pH, UA: 7 (ref 5.0–7.5)

## 2023-01-12 MED ORDER — IOHEXOL 300 MG/ML  SOLN
100.0000 mL | Freq: Once | INTRAMUSCULAR | Status: AC | PRN
  Administered 2023-01-12: 100 mL via INTRAVENOUS

## 2023-01-12 MED ORDER — CIPROFLOXACIN HCL 500 MG PO TABS: 500.0000 mg | ORAL_TABLET | Freq: Two times a day (BID) | ORAL | 0 refills | Status: DC

## 2023-01-12 MED ORDER — METRONIDAZOLE 500 MG PO TABS: 500.0000 mg | ORAL_TABLET | Freq: Two times a day (BID) | ORAL | 0 refills | Status: DC

## 2023-01-12 NOTE — Progress Notes (Addendum)
Subjective:  Patient ID: Charlotte Aguirre, female    DOB: [birth year]  Age: 77 y.o. MRN: [id number]  CC: Diverticulitis   HPI [name] presents for RLQ abd pain. Chronic, but worse than usual for the last week. Points to [place] point.  Has IBS-C. Using laxative, and prn Linzess. Abdomen feels sore, even with walking. Appetite normal. No fever.Thinks she had incidental appendectomy with oopherectomy greater  than 25 years ago.      01/12/2023    9:37 AM 12/26/2022   11:52 AM 09/16/2022    9:43 AM  Depression screen PHQ 2/9  Decreased Interest 0 0 0  Down, Depressed, Hopeless 0 0 0  PHQ - 2 Score 0 0 0  Altered sleeping  0 0  Tired, decreased energy  2 3  Change in appetite  0 0  Feeling bad or failure about yourself   0 0  Trouble concentrating  0 0  Moving slowly or fidgety/restless  0 0  Suicidal thoughts  0 0  PHQ-9 Score  2 3  Difficult doing work/chores  Not difficult at all Not difficult at all    History Breawna has a past medical history of Anxiety, Arthritis, Cataract, Fibromyalgia, GERD (gastroesophageal reflux disease), Hyperlipidemia, Hypertension, IBS (irritable bowel syndrome), Memory changes, Osteopenia, Peripheral neuropathy, and Vitamin D insufficiency.   She has a past surgical history that includes Cholecystectomy; Colonoscopy (N/A, 03/22/2014); Abdominal hysterectomy; Oophorectomy; Cataract extraction (Left, 01/23/2022); Wisdom tooth extraction; Appendectomy; Bladder surgery; Toe Surgery (Left); Breast excisional biopsy; Cataract extraction (Right); and Cholecystectomy.   Her family history includes COPD in her sister; Cancer in her brother and brother; Colon polyps in her sister; Dementia in her brother; Diabetes in her sister; Glaucoma in her father; Heart attack in her brother, brother, brother, father, and sister; Heart failure in her brother, father, mother, and sister; Migraines in her brother, mother, and nephew; Obesity in her sister; Osteoporosis in her  sister; Stomach cancer in her brother; Throat cancer in her son; Thyroid disease in her son; Tongue cancer in her son.She reports that she quit smoking about 34 years ago. Her smoking use included cigarettes. She has a 45.00 pack-year smoking history. She has never used smokeless tobacco. She reports that she does not drink alcohol and does not use drugs.    ROS Review of Systems  Constitutional: Negative.   HENT: Negative.    Eyes:  Negative for visual disturbance.  Respiratory:  Negative for shortness of breath.   Cardiovascular:  Negative for chest pain.  Gastrointestinal:  Positive for abdominal pain.  Musculoskeletal:  Negative for arthralgias.    Objective:  BP 131/69   Pulse 78   Temp 97.8 F (36.6 C)   Ht '5\' 1"'$  (1.549 m)   Wt 192 lb (87.1 kg)   SpO2 96%   BMI 36.28 kg/m   BP Readings from Last 3 Encounters:  01/12/23 131/69  12/26/22 122/75  12/25/22 120/60    Wt Readings from Last 3 Encounters:  01/12/23 192 lb (87.1 kg)  12/26/22 190 lb 6.4 oz (86.4 kg)  12/25/22 192 lb 12.8 oz (87.5 kg)     Physical Exam Constitutional:      General: She is not in acute distress.    Appearance: She is well-developed.  HENT:     Head: Normocephalic and atraumatic.  Eyes:     Conjunctiva/sclera: Conjunctivae normal.     Pupils: Pupils are equal, round, and reactive to light.  Neck:  Thyroid: No thyromegaly.  Cardiovascular:     Rate and Rhythm: Normal rate and regular rhythm.     Heart sounds: Normal heart sounds. No murmur heard. Pulmonary:     Effort: Pulmonary effort is normal. No respiratory distress.     Breath sounds: Normal breath sounds. No wheezing or rales.  Abdominal:     General: Bowel sounds are normal. There is no distension.     Palpations: Abdomen is soft.     Tenderness: There is abdominal tenderness (RLQ).  Musculoskeletal:        General: Normal range of motion.     Cervical back: Normal range of motion and neck supple.  Lymphadenopathy:      Cervical: No cervical adenopathy.  Skin:    General: Skin is warm and dry.  Neurological:     Mental Status: She is alert and oriented to person, place, and time.  Psychiatric:        Behavior: Behavior normal.        Thought Content: Thought content normal.        Judgment: Judgment normal.       Assessment & Plan:   Jearl was seen today for diverticulitis.  Diagnoses and all orders for this visit:  Lower abdominal pain -     Urinalysis, Complete -     Urine Culture  Right lower quadrant abdominal pain -     Cancel: CT ABDOMEN PELVIS W CONTRAST; Future -     CT ABDOMEN PELVIS W CONTRAST; Future  Abdominal aortic atherosclerosis (HCC)  Lesion of pancreas  Other orders -     ciprofloxacin (CIPRO) 500 MG tablet; Take 1 tablet (500 mg total) by mouth 2 (two) times daily. -     metroNIDAZOLE (FLAGYL) 500 MG tablet; Take 1 tablet (500 mg total) by mouth 2 (two) times daily.       I am having Chauncey Cruel. Paulson start on ciprofloxacin and metroNIDAZOLE. I am also having her maintain her Cholecalciferol (VITAMIN D-3 PO), Multiple Vitamins-Minerals (PRESERVISION AREDS 2 PO), ondansetron, Linzess, simvastatin, hydrochlorothiazide, gabapentin, pantoprazole, potassium chloride, and Melatonin.  Allergies as of 01/12/2023       Reactions   Aspirin    Must take EC, prior history of stomach ulcer. Other reaction(s): Angioedema (ALLERGY/intolerance) Must take EC, prior history of stomach ulcer.   Bactrim [sulfamethoxazole-trimethoprim] Nausea And Vomiting   Topiramate Other (See Comments)   hallucinations        Medication List        Accurate as of January 12, 2023  2:17 PM. If you have any questions, ask your nurse or doctor.          ciprofloxacin 500 MG tablet Commonly known as: Cipro Take 1 tablet (500 mg total) by mouth 2 (two) times daily. Started by: [name], MD   gabapentin 300 MG capsule Commonly known as: NEURONTIN TAKE 1 CAPSULE BY MOUTH THREE TIMES  A DAY   hydrochlorothiazide 25 MG tablet Commonly known as: HYDRODIURIL TAKE 1 TABLET (25 MG TOTAL) BY MOUTH DAILY.   Linzess 72 MCG capsule Generic drug: linaclotide TAKE 1 CAPSULE BY MOUTH DAILY BEFORE BREAKFAST.   Melatonin 1 MG Caps Take 10 mg by mouth.   metroNIDAZOLE 500 MG tablet Commonly known as: FLAGYL Take 1 tablet (500 mg total) by mouth 2 (two) times daily. Started by: [name], MD   ondansetron 4 MG tablet Commonly known as: Zofran Take 1 tablet (4 mg total) by mouth every 8 (eight) hours  as needed for nausea or vomiting.   pantoprazole 40 MG tablet Commonly known as: PROTONIX TAKE 1 TABLET BY MOUTH EVERY DAY   potassium chloride 10 MEQ tablet Commonly known as: KLOR-CON TAKE 1 TABLET BY MOUTH EVERY DAY   PRESERVISION AREDS 2 PO daily.   simvastatin 40 MG tablet Commonly known as: ZOCOR TAKE 1 TABLET BY MOUTH EVERY DAY   VITAMIN D-3 PO Take 1 tablet by mouth daily. 2000 units         Follow-up: Return in about 2 days (around 01/14/2023).  [name], M.D.

## 2023-01-12 NOTE — Addendum Note (Signed)
Addended by: [name] on: 01/12/2023 02:17 PM   Modules accepted: Orders

## 2023-01-12 NOTE — Progress Notes (Signed)
Pt had saline and  <20ccs contrast extravasation to left AC region during CT abdomen/pelvis on 01/12/2023. Dr Thornton Papas evaluated pt and gave instruction. All of pts questions were answered and I gave pt the phone number to CT to call if she had any additional questions or concerns in the future.

## 2023-01-12 NOTE — Addendum Note (Signed)
Addended by: Baldomero Lamy B on: 01/12/2023 10:28 AM   Modules accepted: Orders

## 2023-01-13 LAB — URINE CULTURE: Organism ID, Bacteria: NO GROWTH

## 2023-01-19 ENCOUNTER — Telehealth: Payer: Self-pay | Admitting: Family

## 2023-01-19 NOTE — Telephone Encounter (Signed)
Patient had a CT scan and was told that she has diverticulitis. She was given ciprofloxacin (CIPRO) 500 MG tablet and  metroNIDAZOLE (FLAGYL) 500 MG tablet - she said they are making her sick and have no appetite. Would like to know if there is something else that can be called in for this. Please call back and advise.

## 2023-01-19 NOTE — Telephone Encounter (Signed)
Patient aware and verbalized understanding. Appt made with [employer]

## 2023-01-19 NOTE — Telephone Encounter (Signed)
DC meds. Folloow up with Ms. Hawks if sx continue

## 2023-01-20 ENCOUNTER — Telehealth (INDEPENDENT_AMBULATORY_CARE_PROVIDER_SITE_OTHER): Payer: Medicare HMO | Admitting: Family

## 2023-01-20 ENCOUNTER — Encounter: Payer: Self-pay | Admitting: Family

## 2023-01-20 DIAGNOSIS — R1031 Right lower quadrant pain: Secondary | ICD-10-CM | POA: Diagnosis not present

## 2023-01-20 DIAGNOSIS — I700 Atherosclerosis of aorta: Secondary | ICD-10-CM | POA: Diagnosis not present

## 2023-01-20 DIAGNOSIS — K869 Disease of pancreas, unspecified: Secondary | ICD-10-CM | POA: Diagnosis not present

## 2023-01-20 NOTE — Progress Notes (Signed)
Virtual Visit Consent   Brock, you are scheduled for a virtual visit with a [facility] provider today. Just as with appointments in the office, your consent must be obtained to participate. Your consent will be active for this visit and any virtual visit you may have with one of our providers in the next 365 days. If you have a MyChart account, a copy of this consent can be sent to you electronically.  As this is a virtual visit, video technology does not allow for your provider to perform a traditional examination. This may limit your provider's ability to fully assess your condition. If your provider identifies any concerns that need to be evaluated in person or the need to arrange testing (such as labs, EKG, etc.), we will make arrangements to do so. Although advances in technology are sophisticated, we cannot ensure that it will always work on either your end or our end. If the connection with a video visit is poor, the visit may have to be switched to a telephone visit. With either a video or telephone visit, we are not always able to ensure that we have a secure connection.  By engaging in this virtual visit, you consent to the provision of healthcare and authorize for your insurance to be billed (if applicable) for the services provided during this visit. Depending on your insurance coverage, you may receive a charge related to this service.  I need to obtain your verbal consent now. Are you willing to proceed with your visit today? [name] has provided verbal consent on 01/20/2023 for a virtual visit (video or telephone). Evelina Dun, FNP  Date: 01/20/2023 8:39 AM  Virtual Visit via Video Note   I, Evelina Dun, connected with  Charlotte Aguirre  ([id number], 10/16/1946) on 01/20/23 at  8:05 AM EDT by a video-enabled telemedicine application and verified that I am speaking with the correct person using two identifiers.  Location: Patient: Virtual Visit Location Patient: Home Provider:  Virtual Visit Location Provider: Home Office   I discussed the limitations of evaluation and management by telemedicine and the availability of in person appointments. The patient expressed understanding and agreed to proceed.    History of Present Illness: Charlotte Aguirre is a 77 y.o. who identifies as a female who was assigned female at birth, and is being seen today for lower abdominal pain that started 01/12/23. She saw Dr. Livia Snellen and had CT abdomen that showed, "1. No acute findings in the abdomen or pelvis. Specifically, no findings to explain the patient's history of right lower quadrant pain. 2. 10 mm hypoattenuating lesion in the uncinate process of the pancreas is new in the interval and likely cystic. Follow-up MRI of the abdomen with and without contrast recommended to further evaluate. 3. Left colonic diverticulosis without diverticulitis. 4.  Aortic Atherosclerosis (ICD10-I70.0)."   She was given Cipro and Flagyl. She reports the Flagyl is making her sick. Reports her pain is improved now. However, intermittent sharp pains of 8 out 10.  HPI: Abdominal Pain This is a recurrent problem. The current episode started 1 to 4 weeks ago. The problem occurs intermittently. The problem has been waxing and waning. The pain is located in the RLQ. The pain is at a severity of 8/10. The quality of the pain is sharp. Associated symptoms include constipation and nausea. Pertinent negatives include no fever, flatus, frequency, headaches or vomiting.    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   Edema  04/17/2021   Educated about COVID-19 virus infection 10/11/2019   Hypokalemia 08/02/2019   Vocal cord atrophy 12/02/2017   Mass of right side of neck 12/02/2017   Morbid obesity ([place]) Q000111Q   Metabolic syndrome [id number]   Slow transit constipation 07/27/2015   Rectocele 10/31/2014   Osteopenia 09/13/2014   Prediabetes 06/23/2014   Trigeminal neuralgia syndrome 02/02/2014   Vitamin D deficiency 02/02/2014   Memory impairment 12/12/2013   GERD (gastroesophageal reflux disease)    Essential hypertension    Irritable bowel syndrome 11/06/2009   Hereditary and idiopathic peripheral neuropathy 02/28/2009   Hyperlipidemia with target LDL less than 100 02/27/2009   Allergic rhinitis 02/27/2009   Diverticulosis of colon 02/27/2009   Osteoarthritis 02/27/2009   Myalgia and myositis 02/27/2009   PEPTIC ULCER DISEASE, HX OF 02/27/2009    Allergies:  Allergies  Allergen Reactions   Aspirin     Must take EC, prior history of stomach ulcer. Other reaction(s): Angioedema (ALLERGY/intolerance) Must take EC, prior history of stomach ulcer.   Bactrim [Sulfamethoxazole-Trimethoprim] Nausea And Vomiting   Topiramate Other (See Comments)    hallucinations   Medications:  Current Outpatient Medications:    Cholecalciferol (VITAMIN D-3 PO), Take 1 tablet by mouth daily. 2000 units, Disp: , Rfl:    ciprofloxacin (CIPRO) 500 MG tablet, Take 1 tablet (500 mg total) by mouth 2 (two) times daily., Disp: 14 tablet, Rfl: 0   gabapentin (NEURONTIN) 300 MG capsule, TAKE 1 CAPSULE BY MOUTH THREE TIMES A DAY, Disp: 90 capsule, Rfl: 5   hydrochlorothiazide (HYDRODIURIL) 25 MG tablet, TAKE 1 TABLET (25 MG TOTAL) BY MOUTH DAILY., Disp: 90 tablet, Rfl: 1   LINZESS 72 MCG capsule, TAKE 1 CAPSULE BY MOUTH DAILY BEFORE BREAKFAST., Disp: 90 capsule, Rfl: 0   Melatonin 1 MG CAPS, Take 10 mg by mouth., Disp: , Rfl:    metroNIDAZOLE (FLAGYL) 500 MG tablet, Take 1 tablet (500 mg total) by mouth 2 (two) times  daily., Disp: 14 tablet, Rfl: 0   Multiple Vitamins-Minerals (PRESERVISION AREDS 2 PO), daily., Disp: , Rfl:    ondansetron (ZOFRAN) 4 MG tablet, Take 1 tablet (4 mg total) by mouth every 8 (eight) hours as needed for nausea or vomiting., Disp: 20 tablet, Rfl: 2   pantoprazole (PROTONIX) 40 MG tablet, TAKE 1 TABLET BY MOUTH EVERY DAY, Disp: 90 tablet, Rfl: 1   potassium chloride (KLOR-CON) 10 MEQ tablet, TAKE 1 TABLET BY MOUTH EVERY DAY, Disp: 90 tablet, Rfl: 1   simvastatin (ZOCOR) 40 MG tablet, TAKE 1 TABLET BY MOUTH EVERY DAY, Disp: 90 tablet, Rfl: 0  Observations/Objective: Patient is well-developed, well-nourished in no acute distress.  Resting comfortably  at home.  Head is normocephalic, atraumatic.  No labored breathing.  Speech is clear and coherent with logical content.  Patient is alert and oriented at baseline.    Assessment and Plan: 1. Aortic atherosclerosis ([place])  2. Lesion of pancreas - MR Abdomen W Wo Contrast; Future - Ambulatory referral to Gastroenterology  3. RLQ abdominal pain - MR Abdomen W Wo Contrast; Future - Ambulatory referral to Gastroenterology  Red flags discussed to go to ED Referral to GI placed MRI ordered  Continue Cipro and flagyl  Report any fevers or increased abdominal pain  Follow Up Instructions: I discussed the assessment and treatment plan with the patient. The patient was provided an opportunity to ask questions and all were answered. The patient agreed with the plan and demonstrated an understanding of the instructions.  A copy of instructions were sent to the patient via MyChart unless otherwise noted below.    The patient  advised to call back or seek an in-person evaluation if the symptoms worsen or if the condition fails to improve as anticipated.  Time:  I spent 48 minutes with the patient via telehealth technology discussing the above problems/concerns.    Evelina Dun, FNP

## 2023-01-27 ENCOUNTER — Encounter: Payer: Self-pay | Admitting: Family

## 2023-01-27 ENCOUNTER — Ambulatory Visit (INDEPENDENT_AMBULATORY_CARE_PROVIDER_SITE_OTHER): Payer: Medicare HMO | Admitting: Family

## 2023-01-27 VITALS — BP 133/69 | HR 68 | Temp 97.1°F | Ht 61.0 in | Wt 191.0 lb

## 2023-01-27 DIAGNOSIS — K5901 Slow transit constipation: Secondary | ICD-10-CM | POA: Diagnosis not present

## 2023-01-27 DIAGNOSIS — R1031 Right lower quadrant pain: Secondary | ICD-10-CM | POA: Diagnosis not present

## 2023-01-27 DIAGNOSIS — K869 Disease of pancreas, unspecified: Secondary | ICD-10-CM | POA: Diagnosis not present

## 2023-01-27 LAB — CBC WITH DIFFERENTIAL/PLATELET
Basophils Absolute: 0.1 10*3/uL (ref 0.0–0.2)
Basos: 1 %
EOS (ABSOLUTE): 0.1 10*3/uL (ref 0.0–0.4)
Eos: 1 %
Hematocrit: 44 % (ref 34.0–46.6)
Hemoglobin: 13.9 g/dL (ref 11.1–15.9)
Immature Grans (Abs): 0 10*3/uL (ref 0.0–0.1)
Immature Granulocytes: 0 %
Lymphocytes Absolute: 2.2 10*3/uL (ref 0.7–3.1)
Lymphs: 26 %
MCH: 26.7 pg (ref 26.6–33.0)
MCHC: 31.6 g/dL (ref 31.5–35.7)
MCV: 85 fL (ref 79–97)
Monocytes Absolute: 0.6 10*3/uL (ref 0.1–0.9)
Monocytes: 7 %
Neutrophils Absolute: 5.6 10*3/uL (ref 1.4–7.0)
Neutrophils: 65 %
Platelets: 249 10*3/uL (ref 150–450)
RBC: 5.21 x10E6/uL (ref 3.77–5.28)
RDW: 13.8 % (ref 11.7–15.4)
WBC: 8.6 10*3/uL (ref 3.4–10.8)

## 2023-01-27 LAB — CMP14+EGFR
ALT: 14 IU/L (ref 0–32)
AST: 17 IU/L (ref 0–40)
Albumin/Globulin Ratio: 1.8 (ref 1.2–2.2)
Albumin: 3.7 g/dL — ABNORMAL LOW (ref 3.8–4.8)
Alkaline Phosphatase: 113 IU/L (ref 44–121)
BUN/Creatinine Ratio: 15 (ref 12–28)
BUN: 12 mg/dL (ref 8–27)
Bilirubin Total: 0.3 mg/dL (ref 0.0–1.2)
CO2: 29 mmol/L (ref 20–29)
Calcium: 9 mg/dL (ref 8.7–10.3)
Chloride: 101 mmol/L (ref 96–106)
Creatinine, Ser: 0.8 mg/dL (ref 0.57–1.00)
Globulin, Total: 2.1 g/dL (ref 1.5–4.5)
Glucose: 100 mg/dL — ABNORMAL HIGH (ref 70–99)
Potassium: 3.8 mmol/L (ref 3.5–5.2)
Sodium: 144 mmol/L (ref 134–144)
Total Protein: 5.8 g/dL — ABNORMAL LOW (ref 6.0–8.5)
eGFR: 75 mL/min/{1.73_m2} (ref >59)

## 2023-01-27 NOTE — Patient Instructions (Signed)

## 2023-01-27 NOTE — Progress Notes (Signed)
Subjective:    Patient ID: Charlotte Aguirre, female    DOB: [birth year], 77 y.o.   MRN: [id number]  Chief Complaint  Patient presents with   Consult    Discuss CT scan    Pt presents to the office today to follow up on on abdominal pain. She had a CT abdomen on 01/12/23 that showed, "1. No acute findings in the abdomen or pelvis. Specifically, no findings to explain the patient's history of right lower quadrant pain. 2. 10 mm hypoattenuating lesion in the uncinate process of the pancreas is new in the interval and likely cystic. Follow-up MRI of the abdomen with and without contrast recommended to further evaluate. 3. Left colonic diverticulosis without diverticulitis. 4.  Aortic Atherosclerosis (ICD10-I70.0)."  She has a MR abdomen scheduled on 02/19/23. We placed a referral to GI. She was given Flagy and Cipro that mildly helped.  Abdominal Pain This is a new problem. The current episode started more than 1 year ago. The problem occurs intermittently. The problem has been waxing and waning. The pain is located in the RLQ. The pain is at a severity of 10/10. The pain is mild. The quality of the pain is aching. Associated symptoms include constipation and nausea. Pertinent negatives include no belching, dysuria, fever, flatus, frequency, hematochezia, hematuria, myalgias or vomiting. The treatment provided moderate relief.  Constipation This is a chronic problem. The current episode started more than 1 year ago. The problem has been waxing and waning since onset. Her stool frequency is 2 to 3 times per week. Associated symptoms include abdominal pain and nausea. Pertinent negatives include no fever, flatus, hematochezia or vomiting. She has tried laxatives (linzess) for the symptoms.      Review of Systems  Constitutional:  Negative for fever.  Gastrointestinal:  Positive for abdominal pain, constipation and nausea. Negative for flatus, hematochezia and vomiting.  Genitourinary:  Negative  for dysuria, frequency and hematuria.  Musculoskeletal:  Negative for myalgias.  All other systems reviewed and are negative.      Objective:   Physical Exam Vitals reviewed.  Constitutional:      General: She is not in acute distress.    Appearance: She is well-developed.  HENT:     Head: Normocephalic and atraumatic.     Right Ear: External ear normal.  Eyes:     Pupils: Pupils are equal, round, and reactive to light.  Neck:     Thyroid: No thyromegaly.  Cardiovascular:     Rate and Rhythm: Normal rate and regular rhythm.     Heart sounds: Normal heart sounds. No murmur heard. Pulmonary:     Effort: Pulmonary effort is normal. No respiratory distress.     Breath sounds: Normal breath sounds. No wheezing.  Abdominal:     General: Bowel sounds are normal. There is no distension.     Palpations: Abdomen is soft.     Tenderness: There is abdominal tenderness (RUQ and RLQ).  Musculoskeletal:        General: No tenderness. Normal range of motion.     Cervical back: Normal range of motion and neck supple.  Skin:    General: Skin is warm and dry.  Neurological:     Mental Status: She is alert and oriented to person, place, and time.     Cranial Nerves: No cranial nerve deficit.     Deep Tendon Reflexes: Reflexes are normal and symmetric.  Psychiatric:        Behavior: Behavior normal.  Thought Content: Thought content normal.        Judgment: Judgment normal.    BP 133/69   Pulse 68   Temp (!) 97.1 F (36.2 C) (Temporal)   Ht 5\' 1"  (1.549 m)   Wt 191 lb (86.6 kg)   BMI 36.09 kg/m      Assessment & Plan:  [name] comes in today with chief complaint of Consult (Discuss CT scan )   Diagnosis and orders addressed:  1. RLQ abdominal pain - US Pelvic Complete With Transvaginal; Future - CMP14+EGFR - CBC with Differential/Platelet  2. Lesion of pancreas - CMP14+EGFR - CBC with Differential/Platelet  3. Slow transit constipation - CMP14+EGFR - CBC  with Differential/Platelet   Labs pending MRI and Transvaginal pending  Health Maintenance reviewed Diet and exercise encouraged  Follow up plan: Keep chronic follow up   Evelina Dun, FNP

## 2023-02-09 ENCOUNTER — Ambulatory Visit (HOSPITAL_COMMUNITY): Payer: Medicare HMO

## 2023-02-09 ENCOUNTER — Other Ambulatory Visit: Payer: Self-pay

## 2023-02-09 ENCOUNTER — Emergency Department (HOSPITAL_COMMUNITY)
Admission: EM | Admit: 2023-02-09 | Discharge: 2023-02-09 | Disposition: A | Discharge status: Home / Self Care | Payer: Medicare HMO | Attending: Emergency Medicine | Admitting: Emergency Medicine

## 2023-02-09 DIAGNOSIS — Z79899 Other long term (current) drug therapy: Secondary | ICD-10-CM | POA: Insufficient documentation

## 2023-02-09 DIAGNOSIS — R42 Dizziness and giddiness: Secondary | ICD-10-CM | POA: Diagnosis not present

## 2023-02-09 DIAGNOSIS — R110 Nausea: Secondary | ICD-10-CM | POA: Insufficient documentation

## 2023-02-09 LAB — CBC WITH DIFFERENTIAL/PLATELET
Abs Immature Granulocytes: 0.02 10*3/uL (ref 0.00–0.07)
Basophils Absolute: 0.1 10*3/uL (ref 0.0–0.1)
Basophils Relative: 1 %
Eosinophils Absolute: 0.2 10*3/uL (ref 0.0–0.5)
Eosinophils Relative: 3 %
HCT: 41.4 % (ref 36.0–46.0)
Hemoglobin: 13.4 g/dL (ref 12.0–15.0)
Immature Granulocytes: 0 %
Lymphocytes Relative: 29 %
Lymphs Abs: 2.3 10*3/uL (ref 0.7–4.0)
MCH: 27.3 pg (ref 26.0–34.0)
MCHC: 32.4 g/dL (ref 30.0–36.0)
MCV: 84.5 fL (ref 80.0–100.0)
Monocytes Absolute: 0.5 10*3/uL (ref 0.1–1.0)
Monocytes Relative: 6 %
Neutro Abs: 4.9 10*3/uL (ref 1.7–7.7)
Neutrophils Relative %: 61 %
Platelets: 229 10*3/uL (ref 150–400)
RBC: 4.9 MIL/uL (ref 3.87–5.11)
RDW: 13.7 % (ref 11.5–15.5)
WBC: 8.1 10*3/uL (ref 4.0–10.5)
nRBC: 0 % (ref 0.0–0.2)

## 2023-02-09 LAB — BASIC METABOLIC PANEL
Anion gap: 9 (ref 5–15)
BUN: 12 mg/dL (ref 8–23)
CO2: 31 mmol/L (ref 22–32)
Calcium: 8.5 mg/dL — ABNORMAL LOW (ref 8.9–10.3)
Chloride: 100 mmol/L (ref 98–111)
Creatinine, Ser: 0.76 mg/dL (ref 0.44–1.00)
GFR, Estimated: >60 mL/min (ref >60)
Glucose, Bld: 124 mg/dL — ABNORMAL HIGH (ref 70–99)
Potassium: 3.4 mmol/L — ABNORMAL LOW (ref 3.5–5.1)
Sodium: 140 mmol/L (ref 135–145)

## 2023-02-09 MED ORDER — DIAZEPAM 5 MG/ML IJ SOLN
2.5000 mg | Freq: Once | INTRAMUSCULAR | Status: AC
  Administered 2023-02-09: 2.5 mg via INTRAVENOUS
  Filled 2023-02-09: qty 2

## 2023-02-09 MED ORDER — LACTATED RINGERS IV BOLUS
1000.0000 mL | Freq: Once | INTRAVENOUS | Status: AC
  Administered 2023-02-09: 1000 mL via INTRAVENOUS

## 2023-02-09 NOTE — ED Notes (Addendum)
Patient ambulated to the bathroom with assistance. Patent appeared dizzy while walking. Patient is now back in bed

## 2023-02-09 NOTE — ED Notes (Signed)
Patient ambulated to the hallway with assistance. Patient states she feels much better. Patient is able to walk without closing her eyes. Patient was also assisted to the bathroom and was able to stand without swaying.

## 2023-02-09 NOTE — ED Provider Notes (Signed)
[place] EMERGENCY DEPARTMENT AT [facility]  Provider Note  CSN: [id number] Arrival date & time: 02/09/23 7425  History Chief Complaint  Patient presents with   Dizziness    Charlotte Aguirre is a 77 y.o. female with history of vertigo reports she began feeling room spinning dizziness and nausea after lunchtime today. She took some meclizine without improvement. Symptoms worsened as the day went on and so she took a second dose without any change. Symptoms are worse with sitting up and standing. Better with rest. No recent fever, cough, congestion or other illness.    Home Medications Prior to Admission medications   Medication Sig Start Date End Date Taking? Authorizing Provider  Cholecalciferol (VITAMIN D-3 PO) Take 1 tablet by mouth daily. 2000 units    [provider]  gabapentin (NEURONTIN) 300 MG capsule TAKE 1 CAPSULE BY MOUTH THREE TIMES A DAY 10/16/22   Hawks, Christy A, FNP  hydrochlorothiazide (HYDRODIURIL) 25 MG tablet TAKE 1 TABLET (25 MG TOTAL) BY MOUTH DAILY. 10/16/22   Junie Spencer, FNP  LINZESS 72 MCG capsule TAKE 1 CAPSULE BY MOUTH DAILY BEFORE BREAKFAST. 06/25/22   Jannifer Rodney A, FNP  Melatonin 1 MG CAPS Take 10 mg by mouth.    [provider]  Multiple Vitamins-Minerals (PRESERVISION AREDS 2 PO) daily.    [provider]  ondansetron (ZOFRAN) 4 MG tablet Take 1 tablet (4 mg total) by mouth every 8 (eight) hours as needed for nausea or vomiting. 05/16/22   Jannifer Rodney A, FNP  pantoprazole (PROTONIX) 40 MG tablet TAKE 1 TABLET BY MOUTH EVERY DAY 12/12/22   Jannifer Rodney A, FNP  potassium chloride (KLOR-CON) 10 MEQ tablet TAKE 1 TABLET BY MOUTH EVERY DAY 12/12/22   Jannifer Rodney A, FNP  simvastatin (ZOCOR) 40 MG tablet TAKE 1 TABLET BY MOUTH EVERY DAY 10/02/22   Jannifer Rodney A, FNP     Allergies    Aspirin, Bactrim [sulfamethoxazole-trimethoprim], and Topiramate   Review of Systems   Review of Systems Please see HPI for  pertinent positives and negatives  Physical Exam BP 136/63   Pulse 96   Temp 98.1 F (36.7 C)   Resp 17   Ht 5\' 1"  (1.549 m)   Wt 86.5 kg   SpO2 99%   BMI 36.03 kg/m   Physical Exam Vitals and nursing note reviewed.  Constitutional:      Appearance: Normal appearance.  HENT:     Head: Normocephalic and atraumatic.     Nose: Nose normal.     Mouth/Throat:     Mouth: Mucous membranes are moist.  Eyes:     Extraocular Movements: Extraocular movements intact.     Conjunctiva/sclera: Conjunctivae normal.  Cardiovascular:     Rate and Rhythm: Normal rate.  Pulmonary:     Effort: Pulmonary effort is normal.     Breath sounds: Normal breath sounds.  Abdominal:     General: Abdomen is flat.     Palpations: Abdomen is soft.     Tenderness: There is no abdominal tenderness.  Musculoskeletal:        General: No swelling. Normal range of motion.     Cervical back: Neck supple.  Skin:    General: Skin is warm and dry.  Neurological:     General: No focal deficit present.     Mental Status: She is alert and oriented to person, place, and time.     Cranial Nerves: No cranial nerve deficit.  Sensory: No sensory deficit.     Motor: No weakness.     Coordination: Coordination normal.  Psychiatric:        Mood and Affect: Mood normal.     ED Results / Procedures / Treatments   EKG EKG Interpretation  Date/Time:  Monday February 09 2023 00:47:17 EDT Ventricular Rate:  65 PR Interval:  167 QRS Duration: 94 QT Interval:  421 QTC Calculation: 438 R Axis:   32 Text Interpretation: Sinus rhythm Abnormal R-wave progression, early transition Baseline wander No significant change since last tracing Confirmed by Susy Frizzle [phone]) on 02/09/2023 1:55:50 AM  Procedures Procedures  Medications Ordered in the ED Medications  lactated ringers bolus 1,000 mL (0 mLs Intravenous Stopped 02/09/23 0403)  diazepam (VALIUM) injection 2.5 mg (2.5 mg Intravenous Given 02/09/23 0219)     Initial Impression and Plan  Patient here with positional vertigo. Unlikely to be central, had a negative head CT in Feb, so doubt structural process such as tumor. Labs done in triage show normal CBC. Unremarkable BMP. Will give IVF and IV valium for symptoms and reassess.   ED Course   Clinical Course as of 02/09/23 0428  Mon Feb 09, 2023  5732 Patient is feeling much better, able to stand and walk without difficulty and eager to go home. Recommend PCP follow up, RTED for any other concerns.   [CS]    Clinical Course User Index [CS] Pollyann Savoy, MD     MDM Rules/Calculators/A&P Medical Decision Making Given presenting complaint, I considered that admission might be necessary. After review of results from ED lab and/or imaging studies, admission to the hospital is not indicated at this time.    Problems Addressed: Vertigo: acute illness or injury  Amount and/or Complexity of Data Reviewed Labs: ordered. Decision-making details documented in ED Course. ECG/medicine tests: ordered and independent interpretation performed. Decision-making details documented in ED Course.  Risk Prescription drug management. Decision regarding hospitalization.     Final Clinical Impression(s) / ED Diagnoses Final diagnoses:  Vertigo    Rx / DC Orders ED Discharge Orders     None        Pollyann Savoy, MD 02/09/23 [phone]

## 2023-02-09 NOTE — ED Triage Notes (Signed)
Pt c/o dizziness and nausea that started yesterday afternoon. Worse with movement.  States she has hx of vertigo and has been taking meclizine but it hasn't helped tonight.

## 2023-02-09 NOTE — ED Notes (Signed)
Introduced myself to patient. Husband is at bedside. Patient reports dizziness and nausea. Patient had a slight headache prior to arrival to the ED. Patient states she takes meclizine for vertigo, but nothing has helped.

## 2023-02-12 ENCOUNTER — Telehealth: Payer: Self-pay

## 2023-02-12 NOTE — Telephone Encounter (Signed)
     Patient  visit on 02/09/2023  at [facility] was for dizziness.  Have you been able to follow up with your primary care physician? Yes  The patient was or was not able to obtain any needed medicine or equipment. Patient was able to obtain medication.  Are there diet recommendations that you are having difficulty following? No  Patient expresses understanding of discharge instructions and education provided has no other needs at this time. Yes   [name]  [facility] Community Resource Care Guide   [email or web address]  ?? [phone]   Website: [email or web address]  [email or web address]

## 2023-02-19 ENCOUNTER — Ambulatory Visit
Admission: RE | Admit: 2023-02-19 | Discharge: 2023-02-19 | Disposition: A | Discharge status: Home / Self Care | Payer: Medicare HMO | Source: Ambulatory Visit | Attending: Family | Admitting: Family

## 2023-02-19 DIAGNOSIS — K869 Disease of pancreas, unspecified: Secondary | ICD-10-CM

## 2023-02-19 DIAGNOSIS — R1031 Right lower quadrant pain: Secondary | ICD-10-CM

## 2023-02-19 MED ORDER — GADOPICLENOL 0.5 MMOL/ML IV SOLN
8.5000 mL | Freq: Once | INTRAVENOUS | Status: AC | PRN
  Administered 2023-02-19: 8.5 mL via INTRAVENOUS

## 2023-02-24 ENCOUNTER — Other Ambulatory Visit: Payer: Self-pay | Admitting: Family

## 2023-02-24 MED ORDER — AMOXICILLIN-POT CLAVULANATE 875-125 MG PO TABS: 1.0000 | ORAL_TABLET | Freq: Two times a day (BID) | ORAL | 0 refills | Status: DC

## 2023-02-25 ENCOUNTER — Other Ambulatory Visit: Payer: Self-pay | Admitting: Family

## 2023-02-25 DIAGNOSIS — E785 Hyperlipidemia, unspecified: Secondary | ICD-10-CM

## 2023-03-14 ENCOUNTER — Other Ambulatory Visit: Payer: Self-pay | Admitting: Family

## 2023-03-14 DIAGNOSIS — M546 Pain in thoracic spine: Secondary | ICD-10-CM

## 2023-03-17 ENCOUNTER — Encounter: Payer: Self-pay | Admitting: Family

## 2023-03-17 ENCOUNTER — Ambulatory Visit (INDEPENDENT_AMBULATORY_CARE_PROVIDER_SITE_OTHER): Payer: Medicare HMO | Admitting: Family

## 2023-03-17 VITALS — BP 130/75 | HR 78 | Temp 97.2°F | Ht 61.0 in | Wt 192.2 lb

## 2023-03-17 DIAGNOSIS — I10 Essential (primary) hypertension: Secondary | ICD-10-CM | POA: Diagnosis not present

## 2023-03-17 DIAGNOSIS — E785 Hyperlipidemia, unspecified: Secondary | ICD-10-CM

## 2023-03-17 DIAGNOSIS — I700 Atherosclerosis of aorta: Secondary | ICD-10-CM

## 2023-03-17 DIAGNOSIS — K5901 Slow transit constipation: Secondary | ICD-10-CM | POA: Diagnosis not present

## 2023-03-17 DIAGNOSIS — Z6836 Body mass index (BMI) 36.0-36.9, adult: Secondary | ICD-10-CM | POA: Diagnosis not present

## 2023-03-17 DIAGNOSIS — M160 Bilateral primary osteoarthritis of hip: Secondary | ICD-10-CM

## 2023-03-17 DIAGNOSIS — K219 Gastro-esophageal reflux disease without esophagitis: Secondary | ICD-10-CM | POA: Diagnosis not present

## 2023-03-17 DIAGNOSIS — G609 Hereditary and idiopathic neuropathy, unspecified: Secondary | ICD-10-CM

## 2023-03-17 MED ORDER — PANTOPRAZOLE SODIUM 40 MG PO TBEC: 40.0000 mg | DELAYED_RELEASE_TABLET | Freq: Every day | ORAL | 1 refills | Status: DC

## 2023-03-17 MED ORDER — HYDROCHLOROTHIAZIDE 25 MG PO TABS: 25.0000 mg | ORAL_TABLET | Freq: Every day | ORAL | 1 refills | Status: DC

## 2023-03-17 MED ORDER — GABAPENTIN 300 MG PO CAPS
ORAL_CAPSULE | ORAL | 5 refills | Status: DC
Start: 2023-03-17 — End: 2023-11-05

## 2023-03-17 NOTE — Patient Instructions (Signed)
Health Maintenance After Age 79 After age 79, you are at a higher risk for certain long-term diseases and infections as well as injuries from falls. Falls are a major cause of broken bones and head injuries in people who are older than age 79. Getting regular preventive care can help to keep you healthy and well. Preventive care includes getting regular testing and making lifestyle changes as recommended by your health care provider. Talk with your health care provider about: Which screenings and tests you should have. A screening is a test that checks for a disease when you have no symptoms. A diet and exercise plan that is right for you. What should I know about screenings and tests to prevent falls? Screening and testing are the best ways to find a health problem early. Early diagnosis and treatment give you the best chance of managing medical conditions that are common after age 79. Certain conditions and lifestyle choices may make you more likely to have a fall. Your health care provider may recommend: Regular vision checks. Poor vision and conditions such as cataracts can make you more likely to have a fall. If you wear glasses, make sure to get your prescription updated if your vision changes. Medicine review. Work with your health care provider to regularly review all of the medicines you are taking, including over-the-counter medicines. Ask your health care provider about any side effects that may make you more likely to have a fall. Tell your health care provider if any medicines that you take make you feel dizzy or sleepy. Strength and balance checks. Your health care provider may recommend certain tests to check your strength and balance while standing, walking, or changing positions. Foot health exam. Foot pain and numbness, as well as not wearing proper footwear, can make you more likely to have a fall. Screenings, including: Osteoporosis screening. Osteoporosis is a condition that causes  the bones to get weaker and break more easily. Blood pressure screening. Blood pressure changes and medicines to control blood pressure can make you feel dizzy. Depression screening. You may be more likely to have a fall if you have a fear of falling, feel depressed, or feel unable to do activities that you used to do. Alcohol use screening. Using too much alcohol can affect your balance and may make you more likely to have a fall. Follow these instructions at home: Lifestyle Do not drink alcohol if: Your health care provider tells you not to drink. If you drink alcohol: Limit how much you have to: 0-1 drink a day for women. 0-2 drinks a day for men. Know how much alcohol is in your drink. In the [place] one drink equals one 12 oz bottle of beer (355 mL), one 5 oz glass of wine (148 mL), or one 1 oz glass of hard liquor (44 mL). Do not use any products that contain nicotine or tobacco. These products include cigarettes, chewing tobacco, and vaping devices, such as e-cigarettes. If you need help quitting, ask your health care provider. Activity  Follow a regular exercise program to stay fit. This will help you maintain your balance. Ask your health care provider what types of exercise are appropriate for you. If you need a cane or walker, use it as recommended by your health care provider. Wear supportive shoes that have nonskid soles. Safety  Remove any tripping hazards, such as rugs, cords, and clutter. Install safety equipment such as grab bars in bathrooms and safety rails on stairs. Keep rooms and walkways   well-lit. General instructions Talk with your health care provider about your risks for falling. Tell your health care provider if: You fall. Be sure to tell your health care provider about all falls, even ones that seem minor. You feel dizzy, tiredness (fatigue), or off-balance. Take over-the-counter and prescription medicines only as told by your health care provider. These include  supplements. Eat a healthy diet and maintain a healthy weight. A healthy diet includes low-fat dairy products, low-fat (lean) meats, and fiber from whole grains, beans, and lots of fruits and vegetables. Stay current with your vaccines. Schedule regular health, dental, and eye exams. Summary Having a healthy lifestyle and getting preventive care can help to protect your health and wellness after age 79. Screening and testing are the best way to find a health problem early and help you avoid having a fall. Early diagnosis and treatment give you the best chance for managing medical conditions that are more common for people who are older than age 79. Falls are a major cause of broken bones and head injuries in people who are older than age 79. Take precautions to prevent a fall at home. Work with your health care provider to learn what changes you can make to improve your health and wellness and to prevent falls. This information is not intended to replace advice given to you by your health care provider. Make sure you discuss any questions you have with your health care provider. Document Revised: 03/11/2021 Document Reviewed: 03/11/2021 Elsevier Patient Education  2023 Elsevier Inc.  

## 2023-03-17 NOTE — Progress Notes (Signed)
Subjective:    Patient ID: Charlotte Aguirre, female    DOB: July 29, 1946, 77 y.o.   MRN: [id number]  Chief Complaint  Patient presents with   Medical Management of Chronic Issues   Pt presents to the office today for chronic follow up. She is followed by GI for GERD, IBS,  diverticulosis, and abdominal pain as needed.    She is followed by Cardiologists for HTN and palpitations.  She is followed by Neurologists every 6 months for primary stabbing headache.    She is morbid obese with a BMI of 36 and hypertension and hyperlipidemia.  Hypertension This is a chronic problem. The current episode started more than 1 year ago. The problem has been resolved since onset. The problem is controlled. Associated symptoms include malaise/fatigue. Pertinent negatives include no peripheral edema or shortness of breath. Risk factors for coronary artery disease include dyslipidemia, obesity and sedentary lifestyle. The current treatment provides moderate improvement.  Gastroesophageal Reflux She complains of belching. This is a chronic problem. The current episode started more than 1 year ago. The problem occurs rarely. She has tried a PPI for the symptoms. The treatment provided moderate relief.  Arthritis Presents for follow-up visit. She complains of pain and stiffness. The symptoms have been stable. Affected locations include the left knee, right knee, right MCP and left MCP (back). Her pain is at a severity of 1/10.  Hyperlipidemia This is a chronic problem. The current episode started more than 1 year ago. The problem is controlled. Exacerbating diseases include obesity. Pertinent negatives include no shortness of breath. Current antihyperlipidemic treatment includes statins. The current treatment provides moderate improvement of lipids. Risk factors for coronary artery disease include dyslipidemia, hypertension, a sedentary lifestyle and post-menopausal.  Constipation This is a chronic problem. The current  episode started more than 1 year ago. The problem has been resolved since onset. Her stool frequency is 1 time per day. Risk factors include obesity. She has tried laxatives for the symptoms. The treatment provided moderate relief.      Review of Systems  Constitutional:  Positive for malaise/fatigue.  Respiratory:  Negative for shortness of breath.   Gastrointestinal:  Positive for constipation.  Musculoskeletal:  Positive for arthritis and stiffness.  All other systems reviewed and are negative.      Objective:   Physical Exam Vitals reviewed.  Constitutional:      General: She is not in acute distress.    Appearance: She is well-developed. She is obese.  HENT:     Head: Normocephalic and atraumatic.     Right Ear: Tympanic membrane normal.     Left Ear: Tympanic membrane normal.  Eyes:     Pupils: Pupils are equal, round, and reactive to light.  Neck:     Thyroid: No thyromegaly.  Cardiovascular:     Rate and Rhythm: Normal rate and regular rhythm.     Heart sounds: Normal heart sounds. No murmur heard. Pulmonary:     Effort: Pulmonary effort is normal. No respiratory distress.     Breath sounds: Normal breath sounds. No wheezing.  Abdominal:     General: Bowel sounds are normal. There is no distension.     Palpations: Abdomen is soft.     Tenderness: There is no abdominal tenderness.  Musculoskeletal:        General: No tenderness. Normal range of motion.     Cervical back: Normal range of motion and neck supple.  Skin:    General: Skin is warm  and dry.  Neurological:     Mental Status: She is alert and oriented to person, place, and time.     Cranial Nerves: No cranial nerve deficit.     Deep Tendon Reflexes: Reflexes are normal and symmetric.  Psychiatric:        Behavior: Behavior normal.        Thought Content: Thought content normal.        Judgment: Judgment normal.          BP 130/75   Pulse 78   Temp (!) 97.2 F (36.2 C) (Temporal)   Ht 5\' 1"   (1.549 m)   Wt 192 lb 3.2 oz (87.2 kg)   SpO2 99%   BMI 36.32 kg/m   Assessment & Plan:  Charlotte Aguirre comes in today with chief complaint of Medical Management of Chronic Issues   Diagnosis and orders addressed:  1. Hereditary and idiopathic peripheral neuropathy - gabapentin (NEURONTIN) 300 MG capsule; TAKE 1 CAPSULE BY MOUTH THREE TIMES A DAY  Dispense: 90 capsule; Refill: 5  2. Essential hypertension - hydrochlorothiazide (HYDRODIURIL) 25 MG tablet; Take 1 tablet (25 mg total) by mouth daily.  Dispense: 90 tablet; Refill: 1  3. Gastroesophageal reflux disease without esophagitis - pantoprazole (PROTONIX) 40 MG tablet; Take 1 tablet (40 mg total) by mouth daily.  Dispense: 90 tablet; Refill: 1  4. Aortic atherosclerosis (HCC)   5. Hyperlipidemia with target LDL less than 100  6. Morbid obesity (HCC)  7. Primary osteoarthritis of both hips   8. Slow transit constipation   Labs pending Health Maintenance reviewed Diet and exercise encouraged  Follow up plan: 6 months    Jannifer Rodney, FNP

## 2023-03-31 ENCOUNTER — Ambulatory Visit (INDEPENDENT_AMBULATORY_CARE_PROVIDER_SITE_OTHER): Payer: Medicare HMO | Admitting: Family Medicine

## 2023-03-31 ENCOUNTER — Encounter: Payer: Self-pay | Admitting: Family Medicine

## 2023-03-31 VITALS — BP 129/77 | HR 98 | Temp 98.9°F | Ht 61.0 in | Wt 188.0 lb

## 2023-03-31 DIAGNOSIS — J3489 Other specified disorders of nose and nasal sinuses: Secondary | ICD-10-CM

## 2023-03-31 DIAGNOSIS — R051 Acute cough: Secondary | ICD-10-CM | POA: Diagnosis not present

## 2023-03-31 MED ORDER — CEFDINIR 300 MG PO CAPS
300.0000 mg | ORAL_CAPSULE | Freq: Two times a day (BID) | ORAL | 0 refills | Status: AC
Start: 2023-03-31 — End: 2023-04-07

## 2023-03-31 NOTE — Progress Notes (Signed)
Acute Office Visit  Subjective:  Patient ID: Charlotte Aguirre, female    DOB: 12-29-1945, 77 y.o.   MRN: [id number]  Chief Complaint  Patient presents with   URI    Nasal congestion, cough, HA, feels lousy. Sx's started on Sat. Husband had same sx's. Tx with ATB, not dx w/ covid, flu.     URI    Patient is in today for URI symptoms. Started on Saturday. Her husband had similar symptoms and was treated with ABX. He was not tested for covid, rsv, flu. Symptoms consist of "neck swollen on Saturday morning" with pain shooting through it. Headache. Coughing, dry, not productive. Clear runny nose. Endorses pressure in frontal and maxillary sinuses. Stats that she was having chills and could not get warm on Sunday night. Tylenol is not helping headache. Delsym is not helping. Denies N/V. Had some diarrhea yesterday, Monday.   ROS As per HPI  Objective:  BP 129/77   Pulse 98   Temp 98.9 F (37.2 C)   Ht 5\' 1"  (1.549 m)   Wt 188 lb (85.3 kg)   SpO2 95%   BMI 35.52 kg/m    Physical Exam Constitutional:      General: She is awake. She is not in acute distress.    Appearance: Normal appearance. She is well-developed and well-groomed. She is not ill-appearing, toxic-appearing or diaphoretic.  HENT:     Right Ear: No drainage, swelling or tenderness. A middle ear effusion is present. There is no impacted cerumen. No foreign body. No mastoid tenderness. No PE tube. No hemotympanum. Tympanic membrane is injected and erythematous. Tympanic membrane is not scarred, perforated, retracted or bulging.     Left Ear: No drainage, swelling or tenderness. A middle ear effusion is present. There is no impacted cerumen. No foreign body. No mastoid tenderness. No PE tube. No hemotympanum. Tympanic membrane is injected and erythematous. Tympanic membrane is not scarred, perforated, retracted or bulging.     Nose: Mucosal edema and rhinorrhea present. No signs of injury. Rhinorrhea is clear.     Right Nostril:  No foreign body or epistaxis.     Left Nostril: No foreign body or epistaxis.     Right Sinus: Frontal sinus tenderness present. No maxillary sinus tenderness.     Left Sinus: Frontal sinus tenderness present. No maxillary sinus tenderness.     Mouth/Throat:     Lips: Pink.     Mouth: Mucous membranes are moist. No injury or oral lesions.     Tongue: No lesions. Tongue does not deviate from midline.     Palate: No mass and lesions.     Pharynx: Posterior oropharyngeal erythema present. No pharyngeal swelling, oropharyngeal exudate or uvula swelling.     Tonsils: No tonsillar exudate or tonsillar abscesses.  Cardiovascular:     Rate and Rhythm: Normal rate.     Pulses: Normal pulses.          Radial pulses are 2+ on the right side and 2+ on the left side.       Posterior tibial pulses are 2+ on the right side and 2+ on the left side.     Heart sounds: Normal heart sounds. No murmur heard.    No gallop.  Pulmonary:     Effort: Pulmonary effort is normal. No respiratory distress.     Breath sounds: Normal breath sounds. Decreased air movement present. No stridor. No wheezing, rhonchi or rales.  Musculoskeletal:     Cervical back: Full  passive range of motion without pain and neck supple.     Right lower leg: No edema.     Left lower leg: No edema.  Skin:    General: Skin is warm.     Capillary Refill: Capillary refill takes less than 2 seconds.  Neurological:     General: No focal deficit present.     Mental Status: She is alert, oriented to person, place, and time and easily aroused. Mental status is at baseline.     GCS: GCS eye subscore is 4. GCS verbal subscore is 5. GCS motor subscore is 6.     Motor: No weakness.  Psychiatric:        Attention and Perception: Attention and perception normal.        Mood and Affect: Mood and affect normal.        Speech: Speech normal.        Behavior: Behavior normal. Behavior is cooperative.        Thought Content: Thought content normal.  Thought content does not include homicidal or suicidal ideation. Thought content does not include homicidal or suicidal plan.        Cognition and Memory: Cognition and memory normal.        Judgment: Judgment normal.     Assessment & Plan:  1. Acute cough 2. Rhinorrhea Gave patient specific instructions for "pill in a pocket" explained that this is most likely a viral infection given timeline of symptoms. Provided written and verbal instructions for her to take antibiotic only if symptoms worsen. Will cover for rhinosinusitis and AOM as she had injected TM bilaterally. Patient was recently treated with augmentin, therefore chose cefdinir. Discussed with patient that if viral panel comes back positive, would not need to take antibiotic.   - cefdinir (OMNICEF) 300 MG capsule; Take 1 capsule (300 mg total) by mouth 2 (two) times daily for 7 days.  Dispense: 14 capsule; Refill: 0 - COVID-19, Flu A+B and RSV  The above assessment and management plan was discussed with the patient. The patient verbalized understanding of and has agreed to the management plan using shared-decision making. Patient is aware to call the clinic if they develop any new symptoms or if symptoms fail to improve or worsen. Patient is aware when to return to the clinic for a follow-up visit. Patient educated on when it is appropriate to go to the emergency department.   Return if symptoms worsen or fail to improve.  Neale Burly, DNP-FNP [facility] [address] [place], [place] [address] [phone]

## 2023-03-31 NOTE — Patient Instructions (Addendum)
Most likely a viral infection. Here is a pocket antibiotic. Please fill if you symptoms develop into those of a bacterial URI. These symptoms include pus from nose, brown or bloody sputum, worsening of symptoms or worsening after a time of feeling better. Viral infections can last 10-14 days.   1. Take meds as prescribed 2. Use a cool mist humidifier especially during the winter months and when heat has been humid. 3. Use saline nose sprays frequently.  4. Saline irrigations of the nose can be very helpful if done frequently. Use 4 times daily for one week. Can use Nettie Pot if able to tolerate.  5. Drink plenty of fluids 6. Keep thermostat turned down low. 7.For any cough or congestion: Use plain Mucinex- regular strength or max strength is fine. 8. For fever, aches or pains- take tylenol or ibuprofen appropriate for age and weight, for fevers greater than 101 orally you may alternate ibuprofen and tylenol every 3 hours. 9. Change out your toothbrush 10. Return to clinic if symptoms worsen or do not improve.

## 2023-04-01 ENCOUNTER — Telehealth: Payer: Self-pay | Admitting: Family

## 2023-04-01 LAB — COVID-19, FLU A+B AND RSV
Influenza A, NAA: NOT DETECTED
Influenza B, NAA: NOT DETECTED
RSV, NAA: NOT DETECTED
SARS-CoV-2, NAA: NOT DETECTED

## 2023-04-01 MED ORDER — BENZONATATE 100 MG PO CAPS: 100.0000 mg | ORAL_CAPSULE | Freq: Three times a day (TID) | ORAL | 0 refills | Status: DC | PRN

## 2023-04-01 NOTE — Telephone Encounter (Signed)
Patient aware and verbalized understanding. °

## 2023-04-27 ENCOUNTER — Other Ambulatory Visit: Payer: Self-pay | Admitting: Family

## 2023-04-27 ENCOUNTER — Ambulatory Visit: Payer: Medicare HMO | Admitting: Neurology

## 2023-04-27 DIAGNOSIS — E876 Hypokalemia: Secondary | ICD-10-CM

## 2023-04-27 DIAGNOSIS — M546 Pain in thoracic spine: Secondary | ICD-10-CM

## 2023-05-01 ENCOUNTER — Encounter: Payer: Self-pay | Admitting: Family Medicine

## 2023-05-01 ENCOUNTER — Ambulatory Visit (INDEPENDENT_AMBULATORY_CARE_PROVIDER_SITE_OTHER): Payer: Medicare HMO | Admitting: Family Medicine

## 2023-05-01 ENCOUNTER — Telehealth: Payer: Self-pay | Admitting: Family

## 2023-05-01 VITALS — BP 135/71 | HR 74 | Temp 98.3°F | Ht 61.0 in | Wt 190.0 lb

## 2023-05-01 DIAGNOSIS — Z0279 Encounter for issue of other medical certificate: Secondary | ICD-10-CM

## 2023-05-01 DIAGNOSIS — H6993 Unspecified Eustachian tube disorder, bilateral: Secondary | ICD-10-CM

## 2023-05-01 DIAGNOSIS — U071 COVID-19: Secondary | ICD-10-CM

## 2023-05-01 DIAGNOSIS — J040 Acute laryngitis: Secondary | ICD-10-CM | POA: Diagnosis not present

## 2023-05-01 LAB — RAPID STREP SCREEN (MED CTR MEBANE ONLY): Strep Gp A Ag, IA W/Reflex: NEGATIVE

## 2023-05-01 LAB — CULTURE, GROUP A STREP

## 2023-05-01 MED ORDER — FLUTICASONE PROPIONATE 50 MCG/ACT NA SUSP
2.0000 | Freq: Every day | NASAL | 6 refills | Status: AC
Start: 2023-05-01 — End: ?

## 2023-05-01 MED ORDER — CETIRIZINE HCL 5 MG PO TABS
5.0000 mg | ORAL_TABLET | Freq: Every day | ORAL | 3 refills | Status: DC
Start: 2023-05-01 — End: 2024-05-09

## 2023-05-01 NOTE — Progress Notes (Signed)
Acute Office Visit  Subjective:  Patient ID: Charlotte Aguirre, female    DOB: 05-Mar-1946, 77 y.o.   MRN: [id number]  Chief Complaint  Patient presents with   throat/neck pain   HPI Patient is in today for pain in her throat. States that she noticed Sunday in church when she was singing that her voice left. Notices when when talks too much it turns gravely and leaves her. It concerns her because her son had throat cancer. She called ENT and cannot get in until August. Denies pain with swallowing. Endorses global sensation. States that she is reactive to smells. Denies choking on food, denies coughing up blood. Last night started with cough, congestion, runny nose. Recently treated for URI last months and feels that she just got better.   ROS As per HPI Objective:  BP 135/71   Pulse 74   Temp 98.3 F (36.8 C)   Ht 5\' 1"  (1.549 m)   Wt 190 lb (86.2 kg)   SpO2 93%   BMI 35.90 kg/m   Physical Exam Constitutional:      General: She is awake. She is not in acute distress.    Appearance: Normal appearance. She is well-developed and well-groomed. She is not ill-appearing, toxic-appearing or diaphoretic.  HENT:     Right Ear: A middle ear effusion is present. There is no impacted cerumen. Tympanic membrane is scarred. Tympanic membrane is not injected, perforated, erythematous, retracted or bulging.     Left Ear: A middle ear effusion is present. There is no impacted cerumen. Tympanic membrane is scarred. Tympanic membrane is not injected, perforated, erythematous, retracted or bulging.     Nose: Congestion and rhinorrhea present. Rhinorrhea is clear.     Right Nostril: Occlusion present. No foreign body, epistaxis or septal hematoma.     Left Nostril: No foreign body, epistaxis or septal hematoma.     Right Turbinates: Enlarged and swollen. Not pale.     Left Turbinates: Not enlarged, swollen or pale.     Right Sinus: Maxillary sinus tenderness present. No frontal sinus tenderness.     Left  Sinus: Maxillary sinus tenderness present. No frontal sinus tenderness.     Mouth/Throat:     Lips: Pink.     Mouth: Mucous membranes are moist. No injury, lacerations, oral lesions or angioedema.     Tongue: No lesions. Tongue does not deviate from midline.     Palate: No mass and lesions.     Pharynx: Posterior oropharyngeal erythema present. No pharyngeal swelling, oropharyngeal exudate or uvula swelling.     Tonsils: No tonsillar exudate or tonsillar abscesses.  Neck:     Thyroid: No thyroid mass or thyromegaly.     Trachea: Trachea and phonation normal.     Comments: Right cervical chain tenderness  Cardiovascular:     Rate and Rhythm: Normal rate and regular rhythm.     Pulses: Normal pulses.          Radial pulses are 2+ on the right side and 2+ on the left side.       Posterior tibial pulses are 2+ on the right side and 2+ on the left side.     Heart sounds: Normal heart sounds. No murmur heard.    No gallop.  Pulmonary:     Effort: Pulmonary effort is normal. No respiratory distress.     Breath sounds: Normal breath sounds. No stridor. No wheezing, rhonchi or rales.  Musculoskeletal:     Cervical back: Full  passive range of motion without pain and neck supple. No erythema. Normal range of motion.     Right lower leg: No edema.     Left lower leg: No edema.  Lymphadenopathy:     Cervical:     Right cervical: No superficial or deep cervical adenopathy.    Left cervical: No superficial or deep cervical adenopathy.  Skin:    General: Skin is warm.     Capillary Refill: Capillary refill takes less than 2 seconds.  Neurological:     General: No focal deficit present.     Mental Status: She is alert, oriented to person, place, and time and easily aroused. Mental status is at baseline.     GCS: GCS eye subscore is 4. GCS verbal subscore is 5. GCS motor subscore is 6.     Motor: No weakness.  Psychiatric:        Attention and Perception: Attention and perception normal.         Mood and Affect: Mood and affect normal.        Speech: Speech normal.        Behavior: Behavior normal. Behavior is cooperative.        Thought Content: Thought content normal. Thought content does not include homicidal or suicidal ideation. Thought content does not include homicidal or suicidal plan.        Cognition and Memory: Cognition and memory normal.        Judgment: Judgment normal.       05/01/2023    9:41 AM 03/17/2023    9:10 AM 01/27/2023    9:54 AM  Depression screen PHQ 2/9  Decreased Interest 0 0 0  Down, Depressed, Hopeless 0 0 0  PHQ - 2 Score 0 0 0  Altered sleeping 0 0 0  Tired, decreased energy 0 0 0  Change in appetite 0 0 0  Feeling bad or failure about yourself  0 0 0  Trouble concentrating 0 0 0  Moving slowly or fidgety/restless 0 0 0  Suicidal thoughts 0 0 0  PHQ-9 Score 0 0 0  Difficult doing work/chores Not difficult at all Not difficult at all Not difficult at all      05/01/2023    9:41 AM 03/17/2023    9:10 AM 01/27/2023    9:55 AM 07/19/2021    3:50 PM  GAD 7 : Generalized Anxiety Score  Nervous, Anxious, on Edge 0 0 0 0  Control/stop worrying 0 0 0 0  Worry too much - different things 0 0 0 0  Trouble relaxing 0 0 0 0  Restless 0 0 0 0  Easily annoyed or irritable 0 0 0 0  Afraid - awful might happen 0 0 0 0  Total GAD 7 Score 0 0 0 0  Anxiety Difficulty Not difficult at all Not difficult at all Not difficult at all     Assessment & Plan:  1. Laryngitis Labs as below. Will communicate results to patient once available.  Discussed with patient that given length of symptoms and timing of testing, may not be able to receive treatment for Covid or Flu. Patient to follow up with ENT as scheduled. Continue supportive treatment. Follow up if symptoms persist.  - COVID-19, Flu A+B and RSV - Rapid Strep Screen ([facility] ONLY); Future - Rapid Strep Screen ([facility] ONLY) - Culture, Group A Strep; Future - Culture, Group A Strep  2.  Eustachian tube dysfunction, bilateral Medications as  below. Follow up with ENT as scheduled.  - fluticasone (FLONASE) 50 MCG/ACT nasal spray; Place 2 sprays into both nostrils daily.  Dispense: 16 g; Refill: 6 - cetirizine (ZYRTEC) 5 MG tablet; Take 1 tablet (5 mg total) by mouth daily.  Dispense: 90 tablet; Refill: 3   The above assessment and management plan was discussed with the patient. The patient verbalized understanding of and has agreed to the management plan using shared-decision making. Patient is aware to call the clinic if they develop any new symptoms or if symptoms fail to improve or worsen. Patient is aware when to return to the clinic for a follow-up visit. Patient educated on when it is appropriate to go to the emergency department.   Return if symptoms worsen or fail to improve.  Neale Burly, DNP-FNP [facility] [address] [place], [place] [address] [phone]

## 2023-05-01 NOTE — Telephone Encounter (Signed)
Ericca Hippler dropped off Handicap form forms to be completed and signed.  Form Fee Paid? (Y/N)    yes        If NO, form is placed on front office manager desk to hold until payment received. If YES, then form will be placed in the RX/HH Nurse Coordinators box for completion.  Form will not be processed until payment is received M

## 2023-05-01 NOTE — Patient Instructions (Signed)
1. Take meds as prescribed 2. Use a cool mist humidifier especially during the winter months and when heat has been humid. 3. Use saline nose sprays frequently.  4. Saline irrigations of the nose can be very helpful if done frequently. Use 4 times daily for one week. Can use Nettie Pot if able to tolerate.  5. Drink plenty of fluids 6. Keep thermostat turned down low. 7.For any cough or congestion: Use plain Mucinex- regular strength or max strength is fine. 8. For fever, aches or pains- take tylenol or ibuprofen appropriate for age and weight, for fevers greater than 101 orally you may alternate ibuprofen and tylenol every 3 hours. 9. Change out your toothbrush 10. Return to clinic if symptoms worsen or do not improve.   Liquid tylenol and honey to coat your throat.

## 2023-05-02 LAB — COVID-19, FLU A+B AND RSV
Influenza A, NAA: NOT DETECTED
Influenza B, NAA: NOT DETECTED
RSV, NAA: NOT DETECTED
SARS-CoV-2, NAA: DETECTED — AB

## 2023-05-03 LAB — CULTURE, GROUP A STREP: Strep A Culture: NEGATIVE

## 2023-05-04 NOTE — Progress Notes (Signed)
Positive for Covid, patient is outside treatment window for Paxlovid. Per CDC guidelines should stay home and away from others until symptoms are improving overall. If fever was present, it should be gone for 24 hours without fever reducing medication, prior to interacting with others. CDC recommends additional precautions such as masking for 5 days after symptoms resolve.

## 2023-05-11 ENCOUNTER — Telehealth: Payer: Self-pay | Admitting: Family Medicine

## 2023-05-11 NOTE — Telephone Encounter (Signed)
Pt called requesting that Neale Burly order a chest xray for her because she has the last 2 times she has seen Jerrel Ivory is how long she has had this cough and feels like its in her chest with some SOB and tightness.

## 2023-05-11 NOTE — Telephone Encounter (Signed)
NEEDS TO BE SEEN IN OFFICE

## 2023-05-12 ENCOUNTER — Encounter: Payer: Self-pay | Admitting: Nurse Practitioner

## 2023-05-12 ENCOUNTER — Ambulatory Visit (INDEPENDENT_AMBULATORY_CARE_PROVIDER_SITE_OTHER): Payer: Medicare HMO | Admitting: Nurse Practitioner

## 2023-05-12 ENCOUNTER — Ambulatory Visit (INDEPENDENT_AMBULATORY_CARE_PROVIDER_SITE_OTHER): Payer: Medicare HMO

## 2023-05-12 VITALS — BP 120/65 | HR 81 | Temp 97.9°F | Ht 61.0 in | Wt 191.0 lb

## 2023-05-12 DIAGNOSIS — R509 Fever, unspecified: Secondary | ICD-10-CM

## 2023-05-12 DIAGNOSIS — R059 Cough, unspecified: Secondary | ICD-10-CM

## 2023-05-12 DIAGNOSIS — J209 Acute bronchitis, unspecified: Secondary | ICD-10-CM | POA: Diagnosis not present

## 2023-05-12 DIAGNOSIS — R079 Chest pain, unspecified: Secondary | ICD-10-CM | POA: Diagnosis not present

## 2023-05-12 MED ORDER — AZITHROMYCIN 250 MG PO TABS
ORAL_TABLET | ORAL | 0 refills | Status: DC
Start: 2023-05-12 — End: 2023-06-01

## 2023-05-12 NOTE — Telephone Encounter (Signed)
Pt aware handicap form ready to pick up

## 2023-05-12 NOTE — Progress Notes (Signed)
Acute Office Visit  Subjective:     Patient ID: Charlotte Aguirre, female    DOB: 12/06/1945, 77 y.o.   MRN: [id number]  Chief Complaint  Patient presents with   Cough    Has had cough for over a month. Was sick end of may and has had cough and chest pain since then.     HPI SUBJECTIVE:  Charlotte Aguirre is a 77 y.o. female who complains of productive cough, fever, SOB, fatigue, for 16-months. She was seen in May for  cough and  was treated with Cefdinir, and reports felling better for a little while and the cough back. She was see again in June and was prescribed Flonase and Zyrtec.Denies  history of asthma. Patient denies smoke cigarettes. She is requesting a chest x-ray.    ROS Negative unless indicated in HPI    Objective:    BP 120/65   Pulse 81   Temp 97.9 F (36.6 C) (Temporal)   Ht 5\' 1"  (1.549 m)   Wt 191 lb (86.6 kg)   SpO2 96%   BMI 36.09 kg/m  BP Readings from Last 3 Encounters:  05/12/23 120/65  05/01/23 135/71  03/31/23 129/77   Wt Readings from Last 3 Encounters:  05/12/23 191 lb (86.6 kg)  05/01/23 190 lb (86.2 kg)  03/31/23 188 lb (85.3 kg)      Physical Exam Vitals and nursing note reviewed.  Constitutional:      Appearance: Normal appearance. She is overweight.  HENT:     Head: Normocephalic and atraumatic.  Eyes:     Extraocular Movements: Extraocular movements intact.     Conjunctiva/sclera: Conjunctivae normal.     Pupils: Pupils are equal, round, and reactive to light.  Cardiovascular:     Rate and Rhythm: Normal rate.     Heart sounds: Normal heart sounds.  Pulmonary:     Breath sounds: Examination of the right-lower field reveals wheezing. Examination of the left-lower field reveals wheezing. Wheezing present.  Musculoskeletal:        General: Normal range of motion.     Cervical back: Normal range of motion and neck supple.     Right lower leg: No edema.     Left lower leg: No edema.  Skin:    General: Skin is warm and dry.      Coloration: Skin is not jaundiced.     Findings: No rash.  Neurological:     General: No focal deficit present.     Mental Status: She is alert and oriented to person, place, and time. Mental status is at baseline.  Psychiatric:        Mood and Affect: Mood normal.        Behavior: Behavior normal.        Thought Content: Thought content normal.        Judgment: Judgment normal.    She appears well, vital signs are as noted. Ears normal.  Throat and pharynx normal.  Neck supple. No adenopathy in the neck. Nose is congested. Sinuses non tender. The chest is clear, without wheezes or rales. No results found for any visits on 05/12/23.      Assessment & Plan:  Cough with fever -     DG Chest 2 View -     Azithromycin; Take 2 tabs on the first, continue with 1-tab daily until  Dispense: 6 each; Refill: 0  Acute bronchitis, unspecified organism -     Azithromycin; Take 2 tabs  on the first, continue with 1-tab daily until  Dispense: 6 each; Refill: 0   ASSESSMENT:  Bronchitis  PLAN: Start Z-pack  # 6 dispensed Continue Flonase and Zyrtec OTC cough suppressant of choice Increase hydration  Call or return to clinic prn if these symptoms worsen or fail to improve as anticipated.   Return if symptoms worsen or fail to improve.  Arrie Aran Santa Lighter, DNP [facility] [address] [place], [place] [address] [phone]

## 2023-05-20 NOTE — Progress Notes (Deleted)
NEUROLOGY FOLLOW UP OFFICE NOTE  Charlotte Aguirre [id number]  Assessment/Plan:   Suspect primary stabbing headache - may have had a flare up while off gabapentin.   Continue gabapentin 300mg  in morning and 600mg  at night Continue melatonin 5mg  at bedtime.  If pain does not continue to improve, she may increase dose to 10mg  at bedtime. Follow up in 4 to 5 monts.     Subjective:  Charlotte Aguirre is a 77 year old female with HTN, HLD, IBS, fibromyalgia and history of recurrent BPPV who follows up for headache.  She is accompanied by her husband who also assists with history. ***  UPDATE: ***  Current NSAIDS/analgesics:  none Current triptans:  none Current ergotamine:  none Current anti-emetic:  Zofran 4mg  Current muscle relaxants:  baclofen 5mg  TID Current Antihypertensive medications:  propranolol 10mg  TID, HCTZ Current Antidepressant medications:  none Current Anticonvulsant medications:  gabapentin 300mg  TID Current anti-CGRP:  none   HISTORY:  Since 2015. She has a focal stabbing headache on the top of the left side of her head. It is typically a single paroxysmal stabbing.  Sometimes it is followed by a sharp and burning pain down the left side of her face to the left eye up to the left nasolabial fold.  It is not triggered by anything, such as chewing, talking, brushing her teeth, or evening. There is no associated symptoms such as nausea, vomiting, photophobia, phonophobia, visual disturbance, or autonomic symptoms. It is not relieved by anything. MRI of brain with and without contrats on 12/22/2013 showed mild chronic small vessel ischemic changes and left parietal developmental venous anomaly but no acute intracranial abnormality.  Normally it would occur once or twice a day.  On 12/21/2022, she woke up with continuous stabbing pain in that region.  She went to the ED where CT head personally reviewed was unremarkable.  For many years she has been taking gabapentin for fibromyalgia.   She had been out of it for a few days but has since resumed taking it.  She has history of difficulty sleeping and started taking 5mg  melatonin at night a couple of nights ago.   Migraine with aura She has history of migraines, which are different from this. They present with visual auras with field cut, and sparkling lights. It is associated with nausea, photophobia, and phonophobia.     Past NSAIDS/analgesics:  tramadol Past abortive triptans:  none Past abortive ergotamine:  none Past muscle relaxants:  Flexeril, Robaxin Past anti-emetic:  none Past antihypertensive medications:  metoprolol Past antidepressant medications:  none Past anticonvulsant medications:  carbamazepine, oxcarbazepine, topiramate       PAST MEDICAL HISTORY: Past Medical History:  Diagnosis Date   Anxiety    situational anxiety/stress   Arthritis    thumb and fingers   Cataract    bilateral -LEFT sx completed   Fibromyalgia    GERD (gastroesophageal reflux disease)    on meds   Hyperlipidemia    on meds   Hypertension    on meds   IBS (irritable bowel syndrome)    Memory changes    Osteopenia    Peripheral neuropathy    bilateral feet   Vitamin D insufficiency     MEDICATIONS: Current Outpatient Medications on File Prior to Visit  Medication Sig Dispense Refill   azithromycin (ZITHROMAX Z-PAK) 250 MG tablet Take 2 tabs on the first, continue with 1-tab daily until 6 each 0   benzonatate (TESSALON PERLES) 100 MG capsule Take  1 capsule (100 mg total) by mouth 3 (three) times daily as needed for cough. 20 capsule 0   cetirizine (ZYRTEC) 5 MG tablet Take 1 tablet (5 mg total) by mouth daily. 90 tablet 3   Cholecalciferol (VITAMIN D-3 PO) Take 1 tablet by mouth daily. 2000 units     fluticasone (FLONASE) 50 MCG/ACT nasal spray Place 2 sprays into both nostrils daily. 16 g 6   gabapentin (NEURONTIN) 300 MG capsule TAKE 1 CAPSULE BY MOUTH THREE TIMES A DAY 90 capsule 5   hydrochlorothiazide  (HYDRODIURIL) 25 MG tablet Take 1 tablet (25 mg total) by mouth daily. 90 tablet 1   LINZESS 72 MCG capsule TAKE 1 CAPSULE BY MOUTH DAILY BEFORE BREAKFAST. 90 capsule 0   Melatonin 1 MG CAPS Take 10 mg by mouth.     Multiple Vitamins-Minerals (PRESERVISION AREDS 2 PO) daily.     ondansetron (ZOFRAN) 4 MG tablet Take 1 tablet (4 mg total) by mouth every 8 (eight) hours as needed for nausea or vomiting. 20 tablet 2   pantoprazole (PROTONIX) 40 MG tablet Take 1 tablet (40 mg total) by mouth daily. 90 tablet 1   potassium chloride (KLOR-CON) 10 MEQ tablet TAKE 1 TABLET BY MOUTH EVERY DAY 90 tablet 1   simvastatin (ZOCOR) 40 MG tablet TAKE 1 TABLET BY MOUTH EVERY DAY 90 tablet 0   No current facility-administered medications on file prior to visit.    ALLERGIES: Allergies  Allergen Reactions   Aspirin     Must take EC, prior history of stomach ulcer. Other reaction(s): Angioedema (ALLERGY/intolerance) Must take EC, prior history of stomach ulcer.   Bactrim [Sulfamethoxazole-Trimethoprim] Nausea And Vomiting   Topiramate Other (See Comments)    hallucinations    FAMILY HISTORY: Family History  Problem Relation Age of Onset   Migraines Mother    Heart failure Mother    Heart failure Father    Heart attack Father    Glaucoma Father    Heart failure Sister    Heart attack Sister    Diabetes Sister    Obesity Sister    COPD Sister        from 2nd hand smoke - never a smoker   Osteoporosis Sister    Colon polyps Sister    Migraines Brother    Dementia Brother    Heart attack Brother    Heart failure Brother    Heart attack Brother    Cancer Brother        LYMPHOMA   Heart attack Brother    Stomach cancer Brother        lymphoma   Cancer Brother        lymphoma-hip   Thyroid disease Son    Throat cancer Son    Tongue cancer Son    Migraines Nephew    Colon cancer Neg Hx    Esophageal cancer Neg Hx    Rectal cancer Neg Hx    Breast cancer Neg Hx       Objective:   *** General: No acute distress.  Patient appears ***-groomed.   Head:  Normocephalic/atraumatic Eyes:  Fundi examined but not visualized Neck: supple, no paraspinal tenderness, full range of motion Heart:  Regular rate and rhythm Lungs:  Clear to auscultation bilaterally Back: No paraspinal tenderness Neurological Exam: alert and oriented.  Speech fluent and not dysarthric, language intact.  CN II-XII intact. Bulk and tone normal, muscle strength 5/5 throughout.  Sensation to light touch intact.  Deep tendon reflexes  2+ throughout, toes downgoing.  Finger to nose testing intact.  Gait normal, Romberg negative.   Shon Millet, DO  CC: ***

## 2023-05-25 ENCOUNTER — Ambulatory Visit: Payer: Medicare HMO | Admitting: Neurology

## 2023-05-25 ENCOUNTER — Encounter: Payer: Self-pay | Admitting: Neurology

## 2023-05-25 DIAGNOSIS — Z029 Encounter for administrative examinations, unspecified: Secondary | ICD-10-CM

## 2023-06-01 ENCOUNTER — Ambulatory Visit (INDEPENDENT_AMBULATORY_CARE_PROVIDER_SITE_OTHER): Payer: Medicare HMO | Admitting: Neurology

## 2023-06-01 ENCOUNTER — Encounter: Payer: Self-pay | Admitting: Neurology

## 2023-06-01 VITALS — BP 124/72 | HR 79 | Ht 61.0 in | Wt 192.2 lb

## 2023-06-01 DIAGNOSIS — G4485 Primary stabbing headache: Secondary | ICD-10-CM | POA: Diagnosis not present

## 2023-06-01 DIAGNOSIS — G43109 Migraine with aura, not intractable, without status migrainosus: Secondary | ICD-10-CM

## 2023-06-01 NOTE — Patient Instructions (Signed)
Continue gabapentin 300mg  in morning and 600mg  at night Continue melatonin 5mg  at bedtime

## 2023-06-01 NOTE — Progress Notes (Signed)
NEUROLOGY FOLLOW UP OFFICE NOTE  Charlotte Aguirre [id number]  Assessment/Plan:   Primary stabbing headache Ocular migraines   Continue gabapentin 300mg  in morning and 600mg  at night Continue melatonin 5mg  at bedtime. Follow up in 6 months.     Subjective:  Charlotte Aguirre is a 77 year old female with HTN, HLD, IBS, fibromyalgia and history of recurrent BPPV who follows up for primary stabbing headache  UPDATE: Doing well.  Only one or two episodes since February.  She also has migraine with visual aura.  No longer getting headaches with her auras now.    Current NSAIDS/analgesics:  none Current triptans:  none Current ergotamine:  none Current anti-emetic:  Zofran 4mg  Current muscle relaxants:  baclofen 5mg  TID Current Antihypertensive medications:  propranolol 10mg  TID, HCTZ Current Antidepressant medications:  none Current Anticonvulsant medications:  gabapentin 300mg  TID Current anti-CGRP:  none   HISTORY:  Since 2015. She has a focal stabbing headache on the top of the left side of her head. It is typically a single paroxysmal stabbing.  Sometimes it is followed by a sharp and burning pain down the left side of her face to the left eye up to the left nasolabial fold.  It is not triggered by anything, such as chewing, talking, brushing her teeth, or evening. There is no associated symptoms such as nausea, vomiting, photophobia, phonophobia, visual disturbance, or autonomic symptoms. It is not relieved by anything. MRI of brain with and without contrats on 12/22/2013 showed mild chronic small vessel ischemic changes and left parietal developmental venous anomaly but no acute intracranial abnormality.  Normally it would occur once or twice a day.  On 12/21/2022, she woke up with continuous stabbing pain in that region.  She went to the ED where CT head personally reviewed was unremarkable.  For many years she has been taking gabapentin for fibromyalgia.  She had been out of it for a few  days but has since resumed taking it.  She has history of difficulty sleeping and started taking 5mg  melatonin at night a couple of nights ago.   Migraine with aura She has history of migraines, which are different from this. They present with visual auras with field cut, and sparkling lights. It is associated with nausea, photophobia, and phonophobia.     Past NSAIDS/analgesics:  tramadol Past abortive triptans:  none Past abortive ergotamine:  none Past muscle relaxants:  Flexeril, Robaxin Past anti-emetic:  none Past antihypertensive medications:  metoprolol Past antidepressant medications:  none Past anticonvulsant medications:  carbamazepine, oxcarbazepine, topiramate       PAST MEDICAL HISTORY: Past Medical History:  Diagnosis Date   Anxiety    situational anxiety/stress   Arthritis    thumb and fingers   Cataract    bilateral -LEFT sx completed   Fibromyalgia    GERD (gastroesophageal reflux disease)    on meds   Hyperlipidemia    on meds   Hypertension    on meds   IBS (irritable bowel syndrome)    Memory changes    Osteopenia    Peripheral neuropathy    bilateral feet   Vitamin D insufficiency     MEDICATIONS: Current Outpatient Medications on File Prior to Visit  Medication Sig Dispense Refill   cetirizine (ZYRTEC) 5 MG tablet Take 1 tablet (5 mg total) by mouth daily. 90 tablet 3   Cholecalciferol (VITAMIN D-3 PO) Take 1 tablet by mouth daily. 2000 units     gabapentin (NEURONTIN) 300 MG  capsule TAKE 1 CAPSULE BY MOUTH THREE TIMES A DAY 90 capsule 5   hydrochlorothiazide (HYDRODIURIL) 25 MG tablet Take 1 tablet (25 mg total) by mouth daily. 90 tablet 1   LINZESS 72 MCG capsule TAKE 1 CAPSULE BY MOUTH DAILY BEFORE BREAKFAST. 90 capsule 0   Melatonin 1 MG CAPS Take 10 mg by mouth.     Multiple Vitamins-Minerals (PRESERVISION AREDS 2 PO) daily.     ondansetron (ZOFRAN) 4 MG tablet Take 1 tablet (4 mg total) by mouth every 8 (eight) hours as needed for  nausea or vomiting. 20 tablet 2   pantoprazole (PROTONIX) 40 MG tablet Take 1 tablet (40 mg total) by mouth daily. 90 tablet 1   potassium chloride (KLOR-CON) 10 MEQ tablet TAKE 1 TABLET BY MOUTH EVERY DAY 90 tablet 1   simvastatin (ZOCOR) 40 MG tablet TAKE 1 TABLET BY MOUTH EVERY DAY 90 tablet 0   fluticasone (FLONASE) 50 MCG/ACT nasal spray Place 2 sprays into both nostrils daily. (Patient not taking: Reported on 06/01/2023) 16 g 6   No current facility-administered medications on file prior to visit.    ALLERGIES: Allergies  Allergen Reactions   Aspirin     Must take EC, prior history of stomach ulcer. Other reaction(s): Angioedema (ALLERGY/intolerance) Must take EC, prior history of stomach ulcer.   Bactrim [Sulfamethoxazole-Trimethoprim] Nausea And Vomiting   Topiramate Other (See Comments)    hallucinations    FAMILY HISTORY: Family History  Problem Relation Age of Onset   Migraines Mother    Heart failure Mother    Heart failure Father    Heart attack Father    Glaucoma Father    Heart failure Sister    Heart attack Sister    Diabetes Sister    Obesity Sister    COPD Sister        from 2nd hand smoke - never a smoker   Osteoporosis Sister    Colon polyps Sister    Migraines Brother    Dementia Brother    Heart attack Brother    Heart failure Brother    Heart attack Brother    Cancer Brother        LYMPHOMA   Heart attack Brother    Stomach cancer Brother        lymphoma   Cancer Brother        lymphoma-hip   Thyroid disease Son    Throat cancer Son    Tongue cancer Son    Migraines Nephew    Colon cancer Neg Hx    Esophageal cancer Neg Hx    Rectal cancer Neg Hx    Breast cancer Neg Hx       Objective:  Blood pressure 124/72, pulse 79, height 5\' 1"  (1.549 m), weight 192 lb 3.2 oz (87.2 kg), SpO2 97%. General: No acute distress.  Patient appears well-groomed.    Shon Millet, DO  CC: Jannifer Rodney, FNP

## 2023-06-05 ENCOUNTER — Other Ambulatory Visit: Payer: Self-pay | Admitting: Family

## 2023-06-05 DIAGNOSIS — E785 Hyperlipidemia, unspecified: Secondary | ICD-10-CM

## 2023-06-12 DIAGNOSIS — E785 Hyperlipidemia, unspecified: Secondary | ICD-10-CM | POA: Diagnosis not present

## 2023-06-12 DIAGNOSIS — E876 Hypokalemia: Secondary | ICD-10-CM | POA: Diagnosis not present

## 2023-06-12 DIAGNOSIS — K219 Gastro-esophageal reflux disease without esophagitis: Secondary | ICD-10-CM | POA: Diagnosis not present

## 2023-06-12 DIAGNOSIS — M1990 Unspecified osteoarthritis, unspecified site: Secondary | ICD-10-CM | POA: Diagnosis not present

## 2023-06-12 DIAGNOSIS — K5900 Constipation, unspecified: Secondary | ICD-10-CM | POA: Diagnosis not present

## 2023-06-12 DIAGNOSIS — M545 Low back pain, unspecified: Secondary | ICD-10-CM | POA: Diagnosis not present

## 2023-06-12 DIAGNOSIS — G629 Polyneuropathy, unspecified: Secondary | ICD-10-CM | POA: Diagnosis not present

## 2023-06-12 DIAGNOSIS — I10 Essential (primary) hypertension: Secondary | ICD-10-CM | POA: Diagnosis not present

## 2023-06-12 DIAGNOSIS — I2510 Atherosclerotic heart disease of native coronary artery without angina pectoris: Secondary | ICD-10-CM | POA: Diagnosis not present

## 2023-06-12 DIAGNOSIS — M8580 Other specified disorders of bone density and structure, unspecified site: Secondary | ICD-10-CM | POA: Diagnosis not present

## 2023-06-12 DIAGNOSIS — G4700 Insomnia, unspecified: Secondary | ICD-10-CM | POA: Diagnosis not present

## 2023-06-16 DIAGNOSIS — R490 Dysphonia: Secondary | ICD-10-CM | POA: Insufficient documentation

## 2023-06-16 DIAGNOSIS — K219 Gastro-esophageal reflux disease without esophagitis: Secondary | ICD-10-CM | POA: Diagnosis not present

## 2023-06-23 DIAGNOSIS — Z961 Presence of intraocular lens: Secondary | ICD-10-CM | POA: Diagnosis not present

## 2023-06-23 DIAGNOSIS — H532 Diplopia: Secondary | ICD-10-CM | POA: Diagnosis not present

## 2023-06-23 DIAGNOSIS — H35372 Puckering of macula, left eye: Secondary | ICD-10-CM | POA: Diagnosis not present

## 2023-06-23 DIAGNOSIS — H5021 Vertical strabismus, right eye: Secondary | ICD-10-CM | POA: Diagnosis not present

## 2023-06-23 DIAGNOSIS — H43813 Vitreous degeneration, bilateral: Secondary | ICD-10-CM | POA: Diagnosis not present

## 2023-06-23 DIAGNOSIS — H353132 Nonexudative age-related macular degeneration, bilateral, intermediate dry stage: Secondary | ICD-10-CM | POA: Diagnosis not present

## 2023-06-24 DIAGNOSIS — H52223 Regular astigmatism, bilateral: Secondary | ICD-10-CM | POA: Diagnosis not present

## 2023-06-24 DIAGNOSIS — H524 Presbyopia: Secondary | ICD-10-CM | POA: Diagnosis not present

## 2023-06-25 ENCOUNTER — Ambulatory Visit (INDEPENDENT_AMBULATORY_CARE_PROVIDER_SITE_OTHER): Payer: Medicare HMO

## 2023-06-25 VITALS — Ht 61.0 in | Wt 189.0 lb

## 2023-06-25 DIAGNOSIS — Z0000 Encounter for general adult medical examination without abnormal findings: Secondary | ICD-10-CM

## 2023-06-25 NOTE — Patient Instructions (Signed)
Charlotte Aguirre , Thank you for taking time to come for your Medicare Wellness Visit. I appreciate your ongoing commitment to your health goals. Please review the following plan we discussed and let me know if I can assist you in the future.   Referrals/Orders/Follow-Ups/Clinician Recommendations: Aim for 30 minutes of exercise or brisk walking, 6-8 glasses of water, and 5 servings of fruits and vegetables each day.   This is a list of the screening recommended for you and due dates:  Health Maintenance  Topic Date Due   COVID-19 Vaccine (5 - 2023-24 season) 07/04/2022   Flu Shot  06/04/2023   Mammogram  09/30/2023   Colon Cancer Screening  01/22/2024   Medicare Annual Wellness Visit  06/24/2024   DEXA scan (bone density measurement)  09/16/2024   DTaP/Tdap/Td vaccine (2 - Td or Tdap) 05/31/2030   Pneumonia Vaccine  Completed   Hepatitis C Screening  Completed   Zoster (Shingles) Vaccine  Completed   HPV Vaccine  Aged Out    Advanced directives: (Copy Requested) Please bring a copy of your health care power of attorney and living will to the office to be added to your chart at your convenience.  Next Medicare Annual Wellness Visit scheduled for next year: Yes  Insert Preventive Care attachment Insert FALL PREVENTION attachment if needed

## 2023-06-25 NOTE — Progress Notes (Signed)
Subjective:   Charlotte Aguirre is a 77 y.o. female who presents for Medicare Annual (Subsequent) preventive examination.  Visit Complete: Virtual  I connected with  Charlotte Aguirre on 06/25/23 by a audio enabled telemedicine application and verified that I am speaking with the correct person using two identifiers.  Patient Location: Home  Provider Location: Home Office  I discussed the limitations of evaluation and management by telemedicine. The patient expressed understanding and agreed to proceed.  Patient Medicare AWV questionnaire was completed by the patient on 06/25/2023; I have confirmed that all information answered by patient is correct and no changes since this date.  Review of Systems    Vital Signs: Unable to obtain new vitals due to this being a telehealth visit.  Cardiac Risk Factors include: advanced age (>27men, >66 women);dyslipidemia;hypertension     Objective:    Today's Vitals   06/25/23 1107  Weight: 189 lb (85.7 kg)  Height: 5\' 1"  (1.549 m)   Body mass index is 35.71 kg/m.     06/25/2023   11:10 AM 06/01/2023    8:57 AM 12/24/2022    1:57 PM 12/21/2022    7:46 AM 12/21/2022    7:42 AM 06/23/2022   12:12 PM 02/19/2022    2:05 PM  Advanced Directives  Does Patient Have a Medical Advance Directive? No No No No No No No  Would patient like information on creating a medical advance directive? Yes (MAU/Ambulatory/Procedural Areas - Information given)    No - Patient declined No - Patient declined     Current Medications (verified) Outpatient Encounter Medications as of 06/25/2023  Medication Sig   cetirizine (ZYRTEC) 5 MG tablet Take 1 tablet (5 mg total) by mouth daily.   Cholecalciferol (VITAMIN D-3 PO) Take 1 tablet by mouth daily. 2000 units   fluticasone (FLONASE) 50 MCG/ACT nasal spray Place 2 sprays into both nostrils daily.   gabapentin (NEURONTIN) 300 MG capsule TAKE 1 CAPSULE BY MOUTH THREE TIMES A DAY   hydrochlorothiazide (HYDRODIURIL) 25 MG tablet  Take 1 tablet (25 mg total) by mouth daily.   LINZESS 72 MCG capsule TAKE 1 CAPSULE BY MOUTH DAILY BEFORE BREAKFAST.   Melatonin 1 MG CAPS Take 10 mg by mouth.   Multiple Vitamins-Minerals (PRESERVISION AREDS 2 PO) daily.   ondansetron (ZOFRAN) 4 MG tablet Take 1 tablet (4 mg total) by mouth every 8 (eight) hours as needed for nausea or vomiting.   pantoprazole (PROTONIX) 40 MG tablet Take 1 tablet (40 mg total) by mouth daily.   potassium chloride (KLOR-CON) 10 MEQ tablet TAKE 1 TABLET BY MOUTH EVERY DAY   simvastatin (ZOCOR) 40 MG tablet TAKE 1 TABLET BY MOUTH EVERY DAY   No facility-administered encounter medications on file as of 06/25/2023.    Allergies (verified) Aspirin, Bactrim [sulfamethoxazole-trimethoprim], and Topiramate   History: Past Medical History:  Diagnosis Date   Anxiety    situational anxiety/stress   Arthritis    thumb and fingers   Cataract    bilateral -LEFT sx completed   Fibromyalgia    GERD (gastroesophageal reflux disease)    on meds   Hyperlipidemia    on meds   Hypertension    on meds   IBS (irritable bowel syndrome)    Memory changes    Osteopenia    Peripheral neuropathy    bilateral feet   Vitamin D insufficiency    Past Surgical History:  Procedure Laterality Date   ABDOMINAL HYSTERECTOMY     uterus  frist and 10 years later both ovaries removed   APPENDECTOMY     BLADDER SURGERY     bladder tack   BREAST EXCISIONAL BIOPSY     CATARACT EXTRACTION Left 01/23/2022   CATARACT EXTRACTION Right    CHOLECYSTECTOMY     CHOLECYSTECTOMY     COLONOSCOPY N/A 03/22/2014   Procedure: COLONOSCOPY;  Surgeon: Malissa Hippo, MD;  Location: [facility];  Service: Endoscopy;  Laterality: N/A;  930   OOPHORECTOMY     TOE SURGERY Left    2nd toe on RIGHT foot w/pins   WISDOM TOOTH EXTRACTION     Family History  Problem Relation Age of Onset   Migraines Mother    Heart failure Mother    Heart failure Father    Heart attack Father     Glaucoma Father    Heart failure Sister    Heart attack Sister    Diabetes Sister    Obesity Sister    COPD Sister        from 2nd hand smoke - never a smoker   Osteoporosis Sister    Colon polyps Sister    Migraines Brother    Dementia Brother    Heart attack Brother    Heart failure Brother    Heart attack Brother    Cancer Brother        LYMPHOMA   Heart attack Brother    Stomach cancer Brother        lymphoma   Cancer Brother        lymphoma-hip   Thyroid disease Son    Throat cancer Son    Tongue cancer Son    Migraines Nephew    Colon cancer Neg Hx    Esophageal cancer Neg Hx    Rectal cancer Neg Hx    Breast cancer Neg Hx    Social History   Socioeconomic History   Marital status: Married    Spouse name: Dorinda Hill   Number of children: 2   Years of education: 12   Highest education level: High school graduate  Occupational History   Occupation: Retired    Associate Professor: [employer]  Tobacco Use   Smoking status: Former    Current packs/day: 0.00    Average packs/day: 1.5 packs/day for 30.0 years (45.0 ttl pk-yrs)    Types: Cigarettes    Start date: 11/03/1958    Quit date: 11/03/1988    Years since quitting: 34.6   Smokeless tobacco: Never  Vaping Use   Vaping status: Never Used  Substance and Sexual Activity   Alcohol use: No   Drug use: No   Sexual activity: Yes    Partners: Male  Other Topics Concern   Not on file  Social History Narrative   Retired. Lives with husband Dorinda Hill. 2 sons, do not live locally. One small dog. She enjoys crocheting and quilting. Is involved in her church.   Social Determinants of Health   Financial Resource Strain: Low Risk  (06/25/2023)   Overall Financial Resource Strain (CARDIA)    Difficulty of Paying Living Expenses: Not hard at all  Food Insecurity: No Food Insecurity (06/25/2023)   Hunger Vital Sign    Worried About Running Out of Food in the Last Year: Never true    Ran Out of Food in the Last Year: Never true   Transportation Needs: No Transportation Needs (06/25/2023)   PRAPARE - Transportation    Lack of Transportation (Medical): No    Lack of  Transportation (Non-Medical): No  Physical Activity: Inactive (06/25/2023)   Exercise Vital Sign    Days of Exercise per Week: 0 days    Minutes of Exercise per Session: 0 min  Stress: No Stress Concern Present (06/25/2023)   [employer] of Occupational Health - Occupational Stress Questionnaire    Feeling of Stress : Not at all  Social Connections: Moderately Integrated (06/25/2023)   Social Connection and Isolation Panel [NHANES]    Frequency of Communication with Friends and Family: More than three times a week    Frequency of Social Gatherings with Friends and Family: More than three times a week    Attends Religious Services: More than 4 times per year    Active Member of [employer] or Organizations: No    Attends Engineer, structural: Never    Marital Status: Married    Tobacco Counseling Counseling given: Not Answered   Clinical Intake:  Pre-visit preparation completed: Yes  Pain : No/denies pain     Nutritional Risks: None Diabetes: No  How often do you need to have someone help you when you read instructions, pamphlets, or other written materials from your doctor or pharmacy?: 1 - Never  Interpreter Needed?: No  Information entered by :: Renie Ora, LPN   Activities of Daily Living    06/25/2023   11:11 AM  In your present state of health, do you have any difficulty performing the following activities:  Hearing? 0  Vision? 0  Difficulty concentrating or making decisions? 0  Walking or climbing stairs? 0  Dressing or bathing? 0  Doing errands, shopping? 0  Preparing Food and eating ? N  Using the Toilet? N  In the past six months, have you accidently leaked urine? N  Do you have problems with loss of bowel control? N  Managing your Medications? N  Managing your Finances? N  Housekeeping or managing your  Housekeeping? N    Patient Care Team: Junie Spencer, FNP as PCP - General (Family Medicine) Malissa Hippo, MD (Inactive) as Consulting Physician (Gastroenterology) Michaelle Copas, MD as Referring Physician (Optometry) Drema Dallas, DO as Consulting Physician (Neurology)  Indicate any recent Medical Services you may have received from other than [facility] providers in the past year (date may be approximate).     Assessment:   This is a routine wellness examination for [facility].  Hearing/Vision screen Vision Screening - Comments:: Wears rx glasses - up to date with routine eye exams with  Dr.Groat  Dietary issues and exercise activities discussed:     Goals Addressed             This Visit's Progress    DIET - INCREASE WATER INTAKE         Depression Screen    06/25/2023   11:09 AM 05/12/2023    2:14 PM 05/01/2023    9:41 AM 03/31/2023    8:42 AM 03/17/2023    9:10 AM 01/27/2023    9:54 AM 01/12/2023    9:37 AM  PHQ 2/9 Scores  PHQ - 2 Score 0 0 0  0 0 0  PHQ- 9 Score 0 0 0  0 0   Exception Documentation    Patient refusal       Fall Risk    06/25/2023   11:08 AM 06/01/2023    8:57 AM 05/12/2023    2:14 PM 05/01/2023    9:41 AM 03/17/2023    9:10 AM  Fall Risk  Falls in the past year? 0 0 0 0 0  Number falls in past yr: 0 0 0 0 0  Injury with Fall? 0 0 0 0 0  Risk for fall due to : No Fall Risks  No Fall Risks History of fall(s) History of fall(s)  Follow up Falls prevention discussed Falls evaluation completed Falls evaluation completed Falls evaluation completed Falls evaluation completed    MEDICARE RISK AT HOME: Medicare Risk at Home Any stairs in or around the home?: No If so, are there any without handrails?: No Home free of loose throw rugs in walkways, pet beds, electrical cords, etc?: Yes Adequate lighting in your home to reduce risk of falls?: Yes Life alert?: No Use of a cane, walker or w/c?: No Grab bars in the bathroom?: Yes Shower chair or bench  in shower?: Yes Elevated toilet seat or a handicapped toilet?: Yes  TIMED UP AND GO:  Was the test performed?  No    Cognitive Function:    10/07/2018    9:53 AM 03/28/2015    2:52 PM  MMSE - Mini Mental State Exam  Orientation to time 5 5  Orientation to Place 5 5  Registration 3 3  Attention/ Calculation 5 5  Recall 3 3  Language- name 2 objects 2 2  Language- repeat 1 1  Language- follow 3 step command 3   Language- read & follow direction 1 1  Write a sentence 1 1  Copy design 1 1  Total score 30         06/25/2023   11:11 AM 06/23/2022   12:14 PM 05/13/2021    1:25 PM 01/23/2020    9:20 AM  6CIT Screen  What Year? 0 points 0 points 0 points 0 points  What month? 0 points 0 points 0 points 0 points  What time? 0 points 0 points 0 points 0 points  Count back from 20 0 points 0 points 0 points 0 points  Months in reverse 0 points 0 points 0 points 0 points  Repeat phrase 0 points 0 points 0 points 0 points  Total Score 0 points 0 points 0 points 0 points    Immunizations Immunization History  Administered Date(s) Administered   Fluad Quad(high Dose 65+) 07/08/2019, 07/19/2021, 07/14/2022   Influenza, High Dose Seasonal PF 07/24/2014, 08/07/2016, 08/11/2017, 08/11/2017, 08/31/2018, 07/08/2019   Influenza,inj,Quad PF,6+ Mos 08/21/2015   Influenza-Unspecified 07/23/2020   Moderna Sars-Covid-2 Vaccination 12/13/2019, 01/10/2020, 10/19/2020, 04/17/2021   Pneumococcal Conjugate-13 03/28/2015   Pneumococcal Polysaccharide-23 02/02/2014   Tdap 05/31/2020   Zoster Recombinant(Shingrix) 05/02/2021, 02/13/2022    TDAP status: Up to date  Flu Vaccine status: Up to date  Pneumococcal vaccine status: Up to date  Covid-19 vaccine status: Completed vaccines  Qualifies for Shingles Vaccine? Yes   Zostavax completed Yes   Shingrix Completed?: Yes  Screening Tests Health Maintenance  Topic Date Due   COVID-19 Vaccine (5 - 2023-24 season) 07/04/2022   INFLUENZA  VACCINE  06/04/2023   MAMMOGRAM  09/30/2023   Colonoscopy  01/22/2024   Medicare Annual Wellness (AWV)  06/24/2024   DEXA SCAN  09/16/2024   DTaP/Tdap/Td (2 - Td or Tdap) 05/31/2030   Pneumonia Vaccine 64+ Years old  Completed   Hepatitis C Screening  Completed   Zoster Vaccines- Shingrix  Completed   HPV VACCINES  Aged Out    Health Maintenance  Health Maintenance Due  Topic Date Due   COVID-19 Vaccine (5 - 2023-24 season) 07/04/2022  INFLUENZA VACCINE  06/04/2023    Colorectal cancer screening: No longer required.   Mammogram status: No longer required due to age.  Bone Density status: Completed 09/16/2022. Results reflect: Bone density results: OSTEOPOROSIS. Repeat every 2 years.  Lung Cancer Screening: (Low Dose CT Chest recommended if Age 76-80 years, 20 pack-year currently smoking OR have quit w/in 15years.) does not qualify.   Lung Cancer Screening Referral: n/a  Additional Screening:  Hepatitis C Screening: does not qualify; Completed 02/02/2014  Vision Screening: Recommended annual ophthalmology exams for early detection of glaucoma and other disorders of the eye. Is the patient up to date with their annual eye exam?  Yes  Who is the provider or what is the name of the office in which the patient attends annual eye exams? Dr.Johnson  If pt is not established with a provider, would they like to be referred to a provider to establish care? No .   Dental Screening: Recommended annual dental exams for proper oral hygiene    Community Resource Referral / Chronic Care Management: CRR required this visit?  No   CCM required this visit?  No     Plan:     I have personally reviewed and noted the following in the patient's chart:   Medical and social history Use of alcohol, tobacco or illicit drugs  Current medications and supplements including opioid prescriptions. Patient is not currently taking opioid prescriptions. Functional ability and  status Nutritional status Physical activity Advanced directives List of other physicians Hospitalizations, surgeries, and ER visits in previous 12 months Vitals Screenings to include cognitive, depression, and falls Referrals and appointments  In addition, I have reviewed and discussed with patient certain preventive protocols, quality metrics, and best practice recommendations. A written personalized care plan for preventive services as well as general preventive health recommendations were provided to patient.     Lorrene Reid, LPN   [id number]   After Visit Summary: (MyChart) Due to this being a telephonic visit, the after visit summary with patients personalized plan was offered to patient via MyChart   Nurse Notes: none

## 2023-07-15 DIAGNOSIS — M25552 Pain in left hip: Secondary | ICD-10-CM | POA: Insufficient documentation

## 2023-07-16 DIAGNOSIS — M25552 Pain in left hip: Secondary | ICD-10-CM | POA: Diagnosis not present

## 2023-08-05 ENCOUNTER — Other Ambulatory Visit: Payer: Self-pay | Admitting: Family

## 2023-08-05 DIAGNOSIS — I10 Essential (primary) hypertension: Secondary | ICD-10-CM

## 2023-08-13 DIAGNOSIS — M25552 Pain in left hip: Secondary | ICD-10-CM | POA: Diagnosis not present

## 2023-08-19 ENCOUNTER — Ambulatory Visit: Payer: Medicare HMO

## 2023-08-24 ENCOUNTER — Other Ambulatory Visit: Payer: Self-pay

## 2023-08-24 ENCOUNTER — Ambulatory Visit: Payer: Medicare HMO | Attending: Student

## 2023-08-24 DIAGNOSIS — M6281 Muscle weakness (generalized): Secondary | ICD-10-CM | POA: Insufficient documentation

## 2023-08-24 DIAGNOSIS — M25552 Pain in left hip: Secondary | ICD-10-CM | POA: Diagnosis not present

## 2023-08-24 NOTE — Therapy (Signed)
OUTPATIENT PHYSICAL THERAPY LOWER EXTREMITY EVALUATION   Patient Name: Charlotte Aguirre MRN: [id number] DOB:August 24, 1946, 77 y.o., female Today's Date: 08/24/2023  END OF SESSION:  PT End of Session - 08/24/23 1301     Visit Number 1    Number of Visits 8    Date for PT Re-Evaluation 09/25/23    PT Start Time 1301    PT Stop Time 1335    PT Time Calculation (min) 34 min    Activity Tolerance Patient tolerated treatment well    Behavior During Therapy WFL for tasks assessed/performed             Past Medical History:  Diagnosis Date   Anxiety    situational anxiety/stress   Arthritis    thumb and fingers   Cataract    bilateral -LEFT sx completed   Fibromyalgia    GERD (gastroesophageal reflux disease)    on meds   Hyperlipidemia    on meds   Hypertension    on meds   IBS (irritable bowel syndrome)    Memory changes    Osteopenia    Peripheral neuropathy    bilateral feet   Vitamin D insufficiency    Past Surgical History:  Procedure Laterality Date   ABDOMINAL HYSTERECTOMY     uterus frist and 10 years later both ovaries removed   APPENDECTOMY     BLADDER SURGERY     bladder tack   BREAST EXCISIONAL BIOPSY     CATARACT EXTRACTION Left 01/23/2022   CATARACT EXTRACTION Right    CHOLECYSTECTOMY     CHOLECYSTECTOMY     COLONOSCOPY N/A 03/22/2014   Procedure: COLONOSCOPY;  Surgeon: Malissa Hippo, MD;  Location: [facility];  Service: Endoscopy;  Laterality: N/A;  930   OOPHORECTOMY     TOE SURGERY Left    2nd toe on RIGHT foot w/pins   WISDOM TOOTH EXTRACTION     Patient Active Problem List   Diagnosis Date Noted   Acute bronchitis 05/12/2023   Cough with fever 05/12/2023   Aortic atherosclerosis (HCC) 01/20/2023   Pseudophakia of both eyes 04/14/2022   Epiretinal membrane, left eye 03/10/2022   Exudative age-related macular degeneration of left eye with active choroidal neovascularization (HCC) 03/10/2022   Intermediate stage nonexudative  age-related macular degeneration of both eyes 03/10/2022   Retinal pigment epithelial detachment 03/10/2022   Edema 04/17/2021   Hypokalemia 08/02/2019   Vocal cord atrophy 12/02/2017   Mass of right side of neck 12/02/2017   Morbid obesity (HCC) 04/15/2016   Metabolic syndrome 04/15/2016   Slow transit constipation 07/27/2015   Rectocele 10/31/2014   Osteopenia 09/13/2014   Prediabetes 06/23/2014   Trigeminal neuralgia syndrome 02/02/2014   Vitamin D deficiency 02/02/2014   Memory impairment 12/12/2013   GERD (gastroesophageal reflux disease)    Essential hypertension    Irritable bowel syndrome 11/06/2009   Hereditary and idiopathic peripheral neuropathy 02/28/2009   Hyperlipidemia with target LDL less than 100 02/27/2009   Allergic rhinitis 02/27/2009   Diverticulosis of colon 02/27/2009   Osteoarthritis 02/27/2009   Myalgia and myositis 02/27/2009   PEPTIC ULCER DISEASE, HX OF 02/27/2009   REFERRING PROVIDER: Eartha Inch, PA   REFERRING DIAG: Pain in left hip   THERAPY DIAG:  Pain in left hip  Muscle weakness (generalized)  Rationale for Evaluation and Treatment: Rehabilitation  ONSET DATE: August 2024  SUBJECTIVE:   SUBJECTIVE STATEMENT: Patient reports that her left hip has been hurting her since about August 2024  with no known cause. She was told that the muscle may have pulled away from the bone. She had an injection that helped resolve her constant pain. However, it still hurts when she walks or moves her hip.   PERTINENT HISTORY: Hypertension, macular degeneration, trigeminal neuralgia syndrome, osteoarthritis, osteopenia, memory deficits, anxiety, and fibromyalgia PAIN:  Are you having pain? Yes: NPRS scale: 8/10 Pain location: let lateral hip Pain description: intermittent dull hurt Aggravating factors: moving her left leg, standing (about 20 minutes at most), walking (less than 20 minutes)  Relieving factors: sitting  PRECAUTIONS: None  RED  FLAGS: None   WEIGHT BEARING RESTRICTIONS: No  FALLS:  Has patient fallen in last 6 months? No  LIVING ENVIRONMENT: Lives with: lives with their spouse Lives in: House/apartment Stairs: No Has following equipment at home: None  OCCUPATION: retired  PLOF: Independent  PATIENT GOALS: reduced pain and be able to stand and walk longer   NEXT MD VISIT: around 09/10/23  OBJECTIVE:  Note: Objective measures were completed at Evaluation unless otherwise noted.  PATIENT SURVEYS:  FOTO 42.66  COGNITION: Overall cognitive status: Within functional limits for tasks assessed     SENSATION: Patient reports no numbness or tingling.   EDEMA:  No lower extremity edema observed  PALPATION: TTP: left IT band and TFL  LOWER EXTREMITY ROM: no left hip   Active ROM Right eval Left eval  Hip flexion 100 96  Hip extension    Hip abduction    Hip adduction    Hip internal rotation    Hip external rotation    Knee flexion    Knee extension    Ankle dorsiflexion    Ankle plantarflexion    Ankle inversion    Ankle eversion     (Blank rows = not tested)  LOWER EXTREMITY MMT:  MMT Right eval Left eval  Hip flexion 4-/5 3+/5  Hip extension    Hip abduction    Hip adduction    Hip internal rotation    Hip external rotation    Knee flexion 4-/5 4-/5  Knee extension 3+/5 3+/5  Ankle dorsiflexion    Ankle plantarflexion    Ankle inversion    Ankle eversion     (Blank rows = not tested)  FUNCTIONAL TESTS:  Supine to sit transfer: modA   GAIT: Assistive device utilized: None Level of assistance: Complete Independence Comments: Trendelenburg gait pattern   TODAY'S TREATMENT:                                                                                                                              DATE:     PATIENT EDUCATION:  Education details: POC, healing, anatomy, prognosis, and goals for therapy Person educated: Patient Education method:  Explanation Education comprehension: verbalized understanding  HOME EXERCISE PROGRAM:   ASSESSMENT:  CLINICAL IMPRESSION: Patient is a 77 y.o. female who was seen today for physical therapy evaluation and treatment for left hip  pain with no known cause. She presented with moderate pain severity and irritability with palpation to her left lateral hip musculature being the most aggravating to her familiar symptoms. She also exhibited reduced hip flexor strength on the left compared to the right. Recommend that she continue with skilled physical therapy to address her impairments to return to her prior level of function.  OBJECTIVE IMPAIRMENTS: Abnormal gait, decreased activity tolerance, decreased mobility, difficulty walking, decreased strength, impaired tone, and pain.   ACTIVITY LIMITATIONS: standing, squatting, transfers, bed mobility, and locomotion level  PARTICIPATION LIMITATIONS: meal prep, cleaning, laundry, driving, shopping, and community activity  PERSONAL FACTORS: Age, Past/current experiences, Time since onset of injury/illness/exacerbation, and 3+ comorbidities: Hypertension, macular degeneration, trigeminal neuralgia syndrome, osteoarthritis, osteopenia, memory deficits, anxiety, and fibromyalgia  are also affecting patient's functional outcome.   REHAB POTENTIAL: Fair    CLINICAL DECISION MAKING: Evolving/moderate complexity  EVALUATION COMPLEXITY: Moderate   GOALS: Goals reviewed with patient? Yes  LONG TERM GOALS: Target date: 09/21/23  Patient will be independent with her HEP.  Baseline:  Goal status: INITIAL  2.  Patient will be able to complete her daily activities without her familiar pain exceeding 6/10. Baseline:  Goal status: INITIAL  3.  Patient will report being able to walk for at least 30 minutes for improved function shopping. Baseline:  Goal status: INITIAL  4.  Patient will be able to ambulate with no significant gait deviations. Baseline:   Goal status: INITIAL  5.  Patient will improve her left hip flexor strength to at least 4-/5 for improved function with her daily activities. Baseline:  Goal status: INITIAL  PLAN:  PT FREQUENCY: 2x/week  PT DURATION: 4 weeks  PLANNED INTERVENTIONS: 97164- PT Re-evaluation, 97110-Therapeutic exercises, 97530- Therapeutic activity, 97112- Neuromuscular re-education, 97535- Self Care, [id number]- Manual therapy, [id number]- Gait training, 97014- Electrical stimulation (unattended), 97035- Ultrasound, Patient/Family education, Balance training, Cryotherapy, and Moist heat  PLAN FOR NEXT SESSION: NuStep, lower extremity strengthening, isometrics, manual therapy, and modalities as needed   Granville Lewis, PT 08/24/2023, 5:03 PM

## 2023-08-25 ENCOUNTER — Ambulatory Visit: Payer: Medicare HMO

## 2023-08-25 DIAGNOSIS — M6281 Muscle weakness (generalized): Secondary | ICD-10-CM

## 2023-08-25 DIAGNOSIS — M25552 Pain in left hip: Secondary | ICD-10-CM | POA: Diagnosis not present

## 2023-08-25 NOTE — Therapy (Signed)
OUTPATIENT PHYSICAL THERAPY LOWER EXTREMITY TREATMENT   Patient Name: Charlotte Aguirre MRN: [id number] DOB:03/23/1946, 77 y.o., female Today's Date: 08/25/2023  END OF SESSION:  PT End of Session - 08/25/23 1607     Visit Number 2    Number of Visits 8    Date for PT Re-Evaluation 09/25/23    PT Start Time 1600    PT Stop Time 1640    PT Time Calculation (min) 40 min    Activity Tolerance Patient tolerated treatment well    Behavior During Therapy WFL for tasks assessed/performed              Past Medical History:  Diagnosis Date   Anxiety    situational anxiety/stress   Arthritis    thumb and fingers   Cataract    bilateral -LEFT sx completed   Fibromyalgia    GERD (gastroesophageal reflux disease)    on meds   Hyperlipidemia    on meds   Hypertension    on meds   IBS (irritable bowel syndrome)    Memory changes    Osteopenia    Peripheral neuropathy    bilateral feet   Vitamin D insufficiency    Past Surgical History:  Procedure Laterality Date   ABDOMINAL HYSTERECTOMY     uterus frist and 10 years later both ovaries removed   APPENDECTOMY     BLADDER SURGERY     bladder tack   BREAST EXCISIONAL BIOPSY     CATARACT EXTRACTION Left 01/23/2022   CATARACT EXTRACTION Right    CHOLECYSTECTOMY     CHOLECYSTECTOMY     COLONOSCOPY N/A 03/22/2014   Procedure: COLONOSCOPY;  Surgeon: Malissa Hippo, MD;  Location: [facility];  Service: Endoscopy;  Laterality: N/A;  930   OOPHORECTOMY     TOE SURGERY Left    2nd toe on RIGHT foot w/pins   WISDOM TOOTH EXTRACTION     Patient Active Problem List   Diagnosis Date Noted   Acute bronchitis 05/12/2023   Cough with fever 05/12/2023   Aortic atherosclerosis (HCC) 01/20/2023   Pseudophakia of both eyes 04/14/2022   Epiretinal membrane, left eye 03/10/2022   Exudative age-related macular degeneration of left eye with active choroidal neovascularization (HCC) 03/10/2022   Intermediate stage nonexudative  age-related macular degeneration of both eyes 03/10/2022   Retinal pigment epithelial detachment 03/10/2022   Edema 04/17/2021   Hypokalemia 08/02/2019   Vocal cord atrophy 12/02/2017   Mass of right side of neck 12/02/2017   Morbid obesity (HCC) 04/15/2016   Metabolic syndrome 04/15/2016   Slow transit constipation 07/27/2015   Rectocele 10/31/2014   Osteopenia 09/13/2014   Prediabetes 06/23/2014   Trigeminal neuralgia syndrome 02/02/2014   Vitamin D deficiency 02/02/2014   Memory impairment 12/12/2013   GERD (gastroesophageal reflux disease)    Essential hypertension    Irritable bowel syndrome 11/06/2009   Hereditary and idiopathic peripheral neuropathy 02/28/2009   Hyperlipidemia with target LDL less than 100 02/27/2009   Allergic rhinitis 02/27/2009   Diverticulosis of colon 02/27/2009   Osteoarthritis 02/27/2009   Myalgia and myositis 02/27/2009   PEPTIC ULCER DISEASE, HX OF 02/27/2009   REFERRING PROVIDER: Eartha Inch, PA   REFERRING DIAG: Pain in left hip   THERAPY DIAG:  Pain in left hip  Muscle weakness (generalized)  Rationale for Evaluation and Treatment: Rehabilitation  ONSET DATE: August 2024  SUBJECTIVE:   SUBJECTIVE STATEMENT: Patient reports that she has been having some excruciating pain especially when she  takes a long step.   PERTINENT HISTORY: Hypertension, macular degeneration, trigeminal neuralgia syndrome, osteoarthritis, osteopenia, memory deficits, anxiety, and fibromyalgia PAIN:  Are you having pain? Yes: NPRS scale: 2-3/10 Pain location: let lateral hip Pain description: intermittent dull hurt Aggravating factors: moving her left leg, standing (about 20 minutes at most), walking (less than 20 minutes)  Relieving factors: sitting  PRECAUTIONS: None  RED FLAGS: None   WEIGHT BEARING RESTRICTIONS: No  FALLS:  Has patient fallen in last 6 months? No  LIVING ENVIRONMENT: Lives with: lives with their spouse Lives in:  House/apartment Stairs: No Has following equipment at home: None  OCCUPATION: retired  PLOF: Independent  PATIENT GOALS: reduced pain and be able to stand and walk longer   NEXT MD VISIT: around 09/10/23  OBJECTIVE:  Note: Objective measures were completed at Evaluation unless otherwise noted.  PATIENT SURVEYS:  FOTO 42.66  COGNITION: Overall cognitive status: Within functional limits for tasks assessed     SENSATION: Patient reports no numbness or tingling.   EDEMA:  No lower extremity edema observed  PALPATION: TTP: left IT band and TFL  LOWER EXTREMITY ROM: no left hip   Active ROM Right eval Left eval  Hip flexion 100 96  Hip extension    Hip abduction    Hip adduction    Hip internal rotation    Hip external rotation    Knee flexion    Knee extension    Ankle dorsiflexion    Ankle plantarflexion    Ankle inversion    Ankle eversion     (Blank rows = not tested)  LOWER EXTREMITY MMT:  MMT Right eval Left eval  Hip flexion 4-/5 3+/5  Hip extension    Hip abduction    Hip adduction    Hip internal rotation    Hip external rotation    Knee flexion 4-/5 4-/5  Knee extension 3+/5 3+/5  Ankle dorsiflexion    Ankle plantarflexion    Ankle inversion    Ankle eversion     (Blank rows = not tested)  FUNCTIONAL TESTS:  Supine to sit transfer: modA   GAIT: Assistive device utilized: None Level of assistance: Complete Independence Comments: Trendelenburg gait pattern   TODAY'S TREATMENT:                                                                                                                              DATE:                                     08/25/23 EXERCISE LOG  Exercise Repetitions and Resistance Comments  NuStep  L2 x 15 minutes   Seated hip ADD isometric  3 minutes w/ 5 second hold    Seated hip flexion  20 reps  LLE only; limited by low back pain   Seated clams  4 minutes  Seated HS curls  Green t-band x 2.5 minutes  LLE  only    Blank cell = exercise not performed today   PATIENT EDUCATION:  Education details: POC, healing, and goals for therapy Person educated: Patient Education method: Explanation Education comprehension: verbalized understanding  HOME EXERCISE PROGRAM:   ASSESSMENT:  CLINICAL IMPRESSION: Patient was introduced to multiple new interventions for improved lower extremity mobility and strength. She required minimal cueing with today's new interventions for proper exercise performance. She experienced no significant increase in pain or discomfort with any of today's interventions. She reported feeling tired, but alright upon the conclusion of treatment. She continues to require skilled physical therapy to address her remaining impairments to return to her prior level of function.   OBJECTIVE IMPAIRMENTS: Abnormal gait, decreased activity tolerance, decreased mobility, difficulty walking, decreased strength, impaired tone, and pain.   ACTIVITY LIMITATIONS: standing, squatting, transfers, bed mobility, and locomotion level  PARTICIPATION LIMITATIONS: meal prep, cleaning, laundry, driving, shopping, and community activity  PERSONAL FACTORS: Age, Past/current experiences, Time since onset of injury/illness/exacerbation, and 3+ comorbidities: Hypertension, macular degeneration, trigeminal neuralgia syndrome, osteoarthritis, osteopenia, memory deficits, anxiety, and fibromyalgia  are also affecting patient's functional outcome.   REHAB POTENTIAL: Fair    CLINICAL DECISION MAKING: Evolving/moderate complexity  EVALUATION COMPLEXITY: Moderate   GOALS: Goals reviewed with patient? Yes  LONG TERM GOALS: Target date: 09/21/23  Patient will be independent with her HEP.  Baseline:  Goal status: INITIAL  2.  Patient will be able to complete her daily activities without her familiar pain exceeding 6/10. Baseline:  Goal status: INITIAL  3.  Patient will report being able to walk for at  least 30 minutes for improved function shopping. Baseline:  Goal status: INITIAL  4.  Patient will be able to ambulate with no significant gait deviations. Baseline:  Goal status: INITIAL  5.  Patient will improve her left hip flexor strength to at least 4-/5 for improved function with her daily activities. Baseline:  Goal status: INITIAL  PLAN:  PT FREQUENCY: 2x/week  PT DURATION: 4 weeks  PLANNED INTERVENTIONS: 97164- PT Re-evaluation, 97110-Therapeutic exercises, 97530- Therapeutic activity, 97112- Neuromuscular re-education, 97535- Self Care, 75643- Manual therapy, [id number]- Gait training, 97014- Electrical stimulation (unattended), 97035- Ultrasound, Patient/Family education, Balance training, Cryotherapy, and Moist heat  PLAN FOR NEXT SESSION: NuStep, lower extremity strengthening, isometrics, manual therapy, and modalities as needed   Granville Lewis, PT 08/25/2023, 4:42 PM

## 2023-09-01 ENCOUNTER — Ambulatory Visit: Payer: Medicare HMO

## 2023-09-01 DIAGNOSIS — M25552 Pain in left hip: Secondary | ICD-10-CM

## 2023-09-01 DIAGNOSIS — M6281 Muscle weakness (generalized): Secondary | ICD-10-CM

## 2023-09-01 NOTE — Therapy (Signed)
OUTPATIENT PHYSICAL THERAPY LOWER EXTREMITY TREATMENT   Patient Name: Charlotte Aguirre MRN: [id number] DOB:03/19/1946, 77 y.o., female Today's Date: 09/01/2023  END OF SESSION:  PT End of Session - 09/01/23 1113     Visit Number 3    Number of Visits 8    Date for PT Re-Evaluation 09/25/23    PT Start Time 1100    PT Stop Time 1143    PT Time Calculation (min) 43 min    Activity Tolerance Patient tolerated treatment well    Behavior During Therapy WFL for tasks assessed/performed               Past Medical History:  Diagnosis Date   Anxiety    situational anxiety/stress   Arthritis    thumb and fingers   Cataract    bilateral -LEFT sx completed   Fibromyalgia    GERD (gastroesophageal reflux disease)    on meds   Hyperlipidemia    on meds   Hypertension    on meds   IBS (irritable bowel syndrome)    Memory changes    Osteopenia    Peripheral neuropathy    bilateral feet   Vitamin D insufficiency    Past Surgical History:  Procedure Laterality Date   ABDOMINAL HYSTERECTOMY     uterus frist and 10 years later both ovaries removed   APPENDECTOMY     BLADDER SURGERY     bladder tack   BREAST EXCISIONAL BIOPSY     CATARACT EXTRACTION Left 01/23/2022   CATARACT EXTRACTION Right    CHOLECYSTECTOMY     CHOLECYSTECTOMY     COLONOSCOPY N/A 03/22/2014   Procedure: COLONOSCOPY;  Surgeon: Malissa Hippo, MD;  Location: [facility];  Service: Endoscopy;  Laterality: N/A;  930   OOPHORECTOMY     TOE SURGERY Left    2nd toe on RIGHT foot w/pins   WISDOM TOOTH EXTRACTION     Patient Active Problem List   Diagnosis Date Noted   Acute bronchitis 05/12/2023   Cough with fever 05/12/2023   Aortic atherosclerosis (HCC) 01/20/2023   Pseudophakia of both eyes 04/14/2022   Epiretinal membrane, left eye 03/10/2022   Exudative age-related macular degeneration of left eye with active choroidal neovascularization (HCC) 03/10/2022   Intermediate stage nonexudative  age-related macular degeneration of both eyes 03/10/2022   Retinal pigment epithelial detachment 03/10/2022   Edema 04/17/2021   Hypokalemia 08/02/2019   Vocal cord atrophy 12/02/2017   Mass of right side of neck 12/02/2017   Morbid obesity (HCC) 04/15/2016   Metabolic syndrome 04/15/2016   Slow transit constipation 07/27/2015   Rectocele 10/31/2014   Osteopenia 09/13/2014   Prediabetes 06/23/2014   Trigeminal neuralgia syndrome 02/02/2014   Vitamin D deficiency 02/02/2014   Memory impairment 12/12/2013   GERD (gastroesophageal reflux disease)    Essential hypertension    Irritable bowel syndrome 11/06/2009   Hereditary and idiopathic peripheral neuropathy 02/28/2009   Hyperlipidemia with target LDL less than 100 02/27/2009   Allergic rhinitis 02/27/2009   Diverticulosis of colon 02/27/2009   Osteoarthritis 02/27/2009   Myalgia and myositis 02/27/2009   PEPTIC ULCER DISEASE, HX OF 02/27/2009   REFERRING PROVIDER: Eartha Inch, PA   REFERRING DIAG: Pain in left hip   THERAPY DIAG:  Pain in left hip  Muscle weakness (generalized)  Rationale for Evaluation and Treatment: Rehabilitation  ONSET DATE: August 2024  SUBJECTIVE:   SUBJECTIVE STATEMENT: Patient reports that he is not hurting today.   PERTINENT HISTORY:  Hypertension, macular degeneration, trigeminal neuralgia syndrome, osteoarthritis, osteopenia, memory deficits, anxiety, and fibromyalgia PAIN:  Are you having pain? Yes: NPRS scale: 0/10 Pain location: let lateral hip Pain description: intermittent dull hurt Aggravating factors: moving her left leg, standing (about 20 minutes at most), walking (less than 20 minutes)  Relieving factors: sitting  PRECAUTIONS: None  RED FLAGS: None   WEIGHT BEARING RESTRICTIONS: No  FALLS:  Has patient fallen in last 6 months? No  LIVING ENVIRONMENT: Lives with: lives with their spouse Lives in: House/apartment Stairs: No Has following equipment at home:  None  OCCUPATION: retired  PLOF: Independent  PATIENT GOALS: reduced pain and be able to stand and walk longer   NEXT MD VISIT: around 09/10/23  OBJECTIVE:  Note: Objective measures were completed at Evaluation unless otherwise noted.  PATIENT SURVEYS:  FOTO 42.66  COGNITION: Overall cognitive status: Within functional limits for tasks assessed     SENSATION: Patient reports no numbness or tingling.   EDEMA:  No lower extremity edema observed  PALPATION: TTP: left IT band and TFL  LOWER EXTREMITY ROM: no left hip   Active ROM Right eval Left eval  Hip flexion 100 96  Hip extension    Hip abduction    Hip adduction    Hip internal rotation    Hip external rotation    Knee flexion    Knee extension    Ankle dorsiflexion    Ankle plantarflexion    Ankle inversion    Ankle eversion     (Blank rows = not tested)  LOWER EXTREMITY MMT:  MMT Right eval Left eval  Hip flexion 4-/5 3+/5  Hip extension    Hip abduction    Hip adduction    Hip internal rotation    Hip external rotation    Knee flexion 4-/5 4-/5  Knee extension 3+/5 3+/5  Ankle dorsiflexion    Ankle plantarflexion    Ankle inversion    Ankle eversion     (Blank rows = not tested)  FUNCTIONAL TESTS:  Supine to sit transfer: modA   GAIT: Assistive device utilized: None Level of assistance: Complete Independence Comments: Trendelenburg gait pattern   TODAY'S TREATMENT:                                                                                                                              DATE:                                     09/01/23 EXERCISE LOG  Exercise Repetitions and Resistance Comments  Nustep  L3 x 15 minutes   LAQ 3# x 2 minutes Alternating LE   Seated hip flexion  3# x 2 x 1 minute Alternating LE  Seated hip ADD isometric  3 minutes w/ 5 second hold    Seated clams  Red t-band x 3 minutes  Seated HS curls  Red t-band x 3 minutes  LLE only    Blank cell =  exercise not performed today                                    08/25/23 EXERCISE LOG  Exercise Repetitions and Resistance Comments  NuStep  L2 x 15 minutes   Seated hip ADD isometric  3 minutes w/ 5 second hold    Seated hip flexion  20 reps  LLE only; limited by low back pain   Seated clams  4 minutes    Seated HS curls  Green t-band x 2.5 minutes  LLE only    Blank cell = exercise not performed today   PATIENT EDUCATION:  Education details: POC, healing, and anatomy  Person educated: Patient Education method: Explanation Education comprehension: verbalized understanding  HOME EXERCISE PROGRAM:   ASSESSMENT:  CLINICAL IMPRESSION: Patient was progressed with long arc quads in addition to familiar interventions with moderate difficulty. She required minimal cueing with today's interventions for proper biomechanics to avoid compensatory movement patterns. She experienced no significant increase in pain or discomfort with any of today's interventions. However, she reported a slight increase in her familiar pain upon standing at the conclusion of treatment. She continues to require skilled physical therapy to address her remaining impairments to return to her prior level of function.  OBJECTIVE IMPAIRMENTS: Abnormal gait, decreased activity tolerance, decreased mobility, difficulty walking, decreased strength, impaired tone, and pain.   ACTIVITY LIMITATIONS: standing, squatting, transfers, bed mobility, and locomotion level  PARTICIPATION LIMITATIONS: meal prep, cleaning, laundry, driving, shopping, and community activity  PERSONAL FACTORS: Age, Past/current experiences, Time since onset of injury/illness/exacerbation, and 3+ comorbidities: Hypertension, macular degeneration, trigeminal neuralgia syndrome, osteoarthritis, osteopenia, memory deficits, anxiety, and fibromyalgia  are also affecting patient's functional outcome.   REHAB POTENTIAL: Fair    CLINICAL DECISION MAKING:  Evolving/moderate complexity  EVALUATION COMPLEXITY: Moderate   GOALS: Goals reviewed with patient? Yes  LONG TERM GOALS: Target date: 09/21/23  Patient will be independent with her HEP.  Baseline:  Goal status: INITIAL  2.  Patient will be able to complete her daily activities without her familiar pain exceeding 6/10. Baseline:  Goal status: INITIAL  3.  Patient will report being able to walk for at least 30 minutes for improved function shopping. Baseline:  Goal status: INITIAL  4.  Patient will be able to ambulate with no significant gait deviations. Baseline:  Goal status: INITIAL  5.  Patient will improve her left hip flexor strength to at least 4-/5 for improved function with her daily activities. Baseline:  Goal status: INITIAL  PLAN:  PT FREQUENCY: 2x/week  PT DURATION: 4 weeks  PLANNED INTERVENTIONS: 97164- PT Re-evaluation, 97110-Therapeutic exercises, 97530- Therapeutic activity, 97112- Neuromuscular re-education, 97535- Self Care, 16109- Manual therapy, [id number]- Gait training, 97014- Electrical stimulation (unattended), 97035- Ultrasound, Patient/Family education, Balance training, Cryotherapy, and Moist heat  PLAN FOR NEXT SESSION: NuStep, lower extremity strengthening, isometrics, manual therapy, and modalities as needed   Granville Lewis, PT 09/01/2023, 12:28 PM

## 2023-09-04 ENCOUNTER — Ambulatory Visit: Payer: Medicare HMO | Attending: Student | Admitting: *Deleted

## 2023-09-04 DIAGNOSIS — M25552 Pain in left hip: Secondary | ICD-10-CM | POA: Insufficient documentation

## 2023-09-04 DIAGNOSIS — M6281 Muscle weakness (generalized): Secondary | ICD-10-CM | POA: Diagnosis not present

## 2023-09-04 NOTE — Therapy (Signed)
OUTPATIENT PHYSICAL THERAPY LOWER EXTREMITY TREATMENT   Patient Name: Charlotte Aguirre MRN: [id number] DOB:1946-09-27, 77 y.o., female Today's Date: 09/04/2023  END OF SESSION:  PT End of Session - 09/04/23 1102     Visit Number 4    Number of Visits 8    Date for PT Re-Evaluation 09/25/23    PT Start Time 1100    PT Stop Time 1150    PT Time Calculation (min) 50 min               Past Medical History:  Diagnosis Date   Anxiety    situational anxiety/stress   Arthritis    thumb and fingers   Cataract    bilateral -LEFT sx completed   Fibromyalgia    GERD (gastroesophageal reflux disease)    on meds   Hyperlipidemia    on meds   Hypertension    on meds   IBS (irritable bowel syndrome)    Memory changes    Osteopenia    Peripheral neuropathy    bilateral feet   Vitamin D insufficiency    Past Surgical History:  Procedure Laterality Date   ABDOMINAL HYSTERECTOMY     uterus frist and 10 years later both ovaries removed   APPENDECTOMY     BLADDER SURGERY     bladder tack   BREAST EXCISIONAL BIOPSY     CATARACT EXTRACTION Left 01/23/2022   CATARACT EXTRACTION Right    CHOLECYSTECTOMY     CHOLECYSTECTOMY     COLONOSCOPY N/A 03/22/2014   Procedure: COLONOSCOPY;  Surgeon: Malissa Hippo, MD;  Location: [facility];  Service: Endoscopy;  Laterality: N/A;  930   OOPHORECTOMY     TOE SURGERY Left    2nd toe on RIGHT foot w/pins   WISDOM TOOTH EXTRACTION     Patient Active Problem List   Diagnosis Date Noted   Acute bronchitis 05/12/2023   Cough with fever 05/12/2023   Aortic atherosclerosis (HCC) 01/20/2023   Pseudophakia of both eyes 04/14/2022   Epiretinal membrane, left eye 03/10/2022   Exudative age-related macular degeneration of left eye with active choroidal neovascularization (HCC) 03/10/2022   Intermediate stage nonexudative age-related macular degeneration of both eyes 03/10/2022   Retinal pigment epithelial detachment 03/10/2022   Edema  04/17/2021   Hypokalemia 08/02/2019   Vocal cord atrophy 12/02/2017   Mass of right side of neck 12/02/2017   Morbid obesity (HCC) 04/15/2016   Metabolic syndrome 04/15/2016   Slow transit constipation 07/27/2015   Rectocele 10/31/2014   Osteopenia 09/13/2014   Prediabetes 06/23/2014   Trigeminal neuralgia syndrome 02/02/2014   Vitamin D deficiency 02/02/2014   Memory impairment 12/12/2013   GERD (gastroesophageal reflux disease)    Essential hypertension    Irritable bowel syndrome 11/06/2009   Hereditary and idiopathic peripheral neuropathy 02/28/2009   Hyperlipidemia with target LDL less than 100 02/27/2009   Allergic rhinitis 02/27/2009   Diverticulosis of colon 02/27/2009   Osteoarthritis 02/27/2009   Myalgia and myositis 02/27/2009   PEPTIC ULCER DISEASE, HX OF 02/27/2009   REFERRING PROVIDER: Eartha Inch, PA   REFERRING DIAG: Pain in left hip   THERAPY DIAG:  Pain in left hip  Muscle weakness (generalized)  Rationale for Evaluation and Treatment: Rehabilitation  ONSET DATE: August 2024  SUBJECTIVE:   SUBJECTIVE STATEMENT: Patient reports that her LT hip doesn't hurt unless she over steps or walks to fast. Very sore from the wt.s last visit   PERTINENT HISTORY: Hypertension, macular degeneration, trigeminal  neuralgia syndrome, osteoarthritis, osteopenia, memory deficits, anxiety, and fibromyalgia PAIN:  Are you having pain? Yes: NPRS scale: 2-3/10 Pain location: let lateral hip Pain description: intermittent dull hurt Aggravating factors: moving her left leg, standing (about 20 minutes at most), walking (less than 20 minutes)  Relieving factors: sitting  PRECAUTIONS: None  RED FLAGS: None   WEIGHT BEARING RESTRICTIONS: No  FALLS:  Has patient fallen in last 6 months? No  LIVING ENVIRONMENT: Lives with: lives with their spouse Lives in: House/apartment Stairs: No Has following equipment at home: None  OCCUPATION: retired  PLOF:  Independent  PATIENT GOALS: reduced pain and be able to stand and walk longer   NEXT MD VISIT: around 09/10/23  OBJECTIVE:  Note: Objective measures were completed at Evaluation unless otherwise noted.  PATIENT SURVEYS:  FOTO 42.66  COGNITION: Overall cognitive status: Within functional limits for tasks assessed     SENSATION: Patient reports no numbness or tingling.   EDEMA:  No lower extremity edema observed  PALPATION: TTP: left IT band and TFL  LOWER EXTREMITY ROM: no left hip   Active ROM Right eval Left eval  Hip flexion 100 96  Hip extension    Hip abduction    Hip adduction    Hip internal rotation    Hip external rotation    Knee flexion    Knee extension    Ankle dorsiflexion    Ankle plantarflexion    Ankle inversion    Ankle eversion     (Blank rows = not tested)  LOWER EXTREMITY MMT:  MMT Right eval Left eval  Hip flexion 4-/5 3+/5  Hip extension    Hip abduction    Hip adduction    Hip internal rotation    Hip external rotation    Knee flexion 4-/5 4-/5  Knee extension 3+/5 3+/5  Ankle dorsiflexion    Ankle plantarflexion    Ankle inversion    Ankle eversion     (Blank rows = not tested)  FUNCTIONAL TESTS:  Supine to sit transfer: modA   GAIT: Assistive device utilized: None Level of assistance: Complete Independence Comments: Trendelenburg gait pattern   TODAY'S TREATMENT:                                                                                                                              DATE:                                     09/04/23 EXERCISE LOG  Exercise Repetitions and Resistance Comments  Nustep  L3 x 15 minutes   LAQ  Alternating LE   Seated hip flexion   Alternating LE  RT side lying Hip ER and IR 3x 10 each exercise   Bridging x10   Seated hip ADD isometric     Seated clams     Seated HS curls  LLE only    Blank cell = exercise not performed today  IFC x 15 mins 80-150hz  with HMP to LT hip                                     08/25/23 EXERCISE LOG  Exercise Repetitions and Resistance Comments  NuStep  L2 x 15 minutes   Seated hip ADD isometric  3 minutes w/ 5 second hold    Seated hip flexion  20 reps  LLE only; limited by low back pain   Seated clams  4 minutes    Seated HS curls  Green t-band x 2.5 minutes  LLE only    Blank cell = exercise not performed today   PATIENT EDUCATION:  Education details: POC, healing, and anatomy  Person educated: Patient Education method: Explanation Education comprehension: verbalized understanding  HOME EXERCISE PROGRAM:   ASSESSMENT:  CLINICAL IMPRESSION: Patient  arrived today reporting increased hip soreness after last Rx, but doing okay today. She was able to perform Nustep and mat exs with mainly fatigue and mild LT hip soreness. No reports of increased pain end of session.     OBJECTIVE IMPAIRMENTS: Abnormal gait, decreased activity tolerance, decreased mobility, difficulty walking, decreased strength, impaired tone, and pain.   ACTIVITY LIMITATIONS: standing, squatting, transfers, bed mobility, and locomotion level  PARTICIPATION LIMITATIONS: meal prep, cleaning, laundry, driving, shopping, and community activity  PERSONAL FACTORS: Age, Past/current experiences, Time since onset of injury/illness/exacerbation, and 3+ comorbidities: Hypertension, macular degeneration, trigeminal neuralgia syndrome, osteoarthritis, osteopenia, memory deficits, anxiety, and fibromyalgia  are also affecting patient's functional outcome.   REHAB POTENTIAL: Fair    CLINICAL DECISION MAKING: Evolving/moderate complexity  EVALUATION COMPLEXITY: Moderate   GOALS: Goals reviewed with patient? Yes  LONG TERM GOALS: Target date: 09/21/23  Patient will be independent with her HEP.  Baseline:  Goal status: INITIAL  2.  Patient will be able to complete her daily activities without her familiar pain exceeding 6/10. Baseline:  Goal status:  INITIAL  3.  Patient will report being able to walk for at least 30 minutes for improved function shopping. Baseline:  Goal status: INITIAL  4.  Patient will be able to ambulate with no significant gait deviations. Baseline:  Goal status: INITIAL  5.  Patient will improve her left hip flexor strength to at least 4-/5 for improved function with her daily activities. Baseline:  Goal status: INITIAL  PLAN:  PT FREQUENCY: 2x/week  PT DURATION: 4 weeks  PLANNED INTERVENTIONS: 97164- PT Re-evaluation, 97110-Therapeutic exercises, 97530- Therapeutic activity, 97112- Neuromuscular re-education, 97535- Self Care, 56213- Manual therapy, [id number]- Gait training, 97014- Electrical stimulation (unattended), 97035- Ultrasound, Patient/Family education, Balance training, Cryotherapy, and Moist heat  PLAN FOR NEXT SESSION: NuStep, lower extremity strengthening, isometrics, manual therapy, and modalities as needed   Margree Gimbel,CHRIS, PTA 09/04/2023, 12:51 PM

## 2023-09-08 ENCOUNTER — Ambulatory Visit: Payer: Medicare HMO | Admitting: Physical Therapy

## 2023-09-08 DIAGNOSIS — M25552 Pain in left hip: Secondary | ICD-10-CM

## 2023-09-08 DIAGNOSIS — M6281 Muscle weakness (generalized): Secondary | ICD-10-CM

## 2023-09-08 NOTE — Therapy (Signed)
OUTPATIENT PHYSICAL THERAPY LOWER EXTREMITY TREATMENT   Patient Name: Charlotte Aguirre MRN: [id number] DOB:06/08/1946, 77 y.o., female Today's Date: 09/08/2023  END OF SESSION:  PT End of Session - 09/08/23 1725     Visit Number 5    Number of Visits 8    Date for PT Re-Evaluation 09/25/23    PT Start Time 0439    PT Stop Time 0517    PT Time Calculation (min) 38 min    Activity Tolerance Patient tolerated treatment well    Behavior During Therapy WFL for tasks assessed/performed               Past Medical History:  Diagnosis Date   Anxiety    situational anxiety/stress   Arthritis    thumb and fingers   Cataract    bilateral -LEFT sx completed   Fibromyalgia    GERD (gastroesophageal reflux disease)    on meds   Hyperlipidemia    on meds   Hypertension    on meds   IBS (irritable bowel syndrome)    Memory changes    Osteopenia    Peripheral neuropathy    bilateral feet   Vitamin D insufficiency    Past Surgical History:  Procedure Laterality Date   ABDOMINAL HYSTERECTOMY     uterus frist and 10 years later both ovaries removed   APPENDECTOMY     BLADDER SURGERY     bladder tack   BREAST EXCISIONAL BIOPSY     CATARACT EXTRACTION Left 01/23/2022   CATARACT EXTRACTION Right    CHOLECYSTECTOMY     CHOLECYSTECTOMY     COLONOSCOPY N/A 03/22/2014   Procedure: COLONOSCOPY;  Surgeon: Malissa Hippo, MD;  Location: [facility];  Service: Endoscopy;  Laterality: N/A;  930   OOPHORECTOMY     TOE SURGERY Left    2nd toe on RIGHT foot w/pins   WISDOM TOOTH EXTRACTION     Patient Active Problem List   Diagnosis Date Noted   Acute bronchitis 05/12/2023   Cough with fever 05/12/2023   Aortic atherosclerosis (HCC) 01/20/2023   Pseudophakia of both eyes 04/14/2022   Epiretinal membrane, left eye 03/10/2022   Exudative age-related macular degeneration of left eye with active choroidal neovascularization (HCC) 03/10/2022   Intermediate stage nonexudative  age-related macular degeneration of both eyes 03/10/2022   Retinal pigment epithelial detachment 03/10/2022   Edema 04/17/2021   Hypokalemia 08/02/2019   Vocal cord atrophy 12/02/2017   Mass of right side of neck 12/02/2017   Morbid obesity (HCC) 04/15/2016   Metabolic syndrome 04/15/2016   Slow transit constipation 07/27/2015   Rectocele 10/31/2014   Osteopenia 09/13/2014   Prediabetes 06/23/2014   Trigeminal neuralgia syndrome 02/02/2014   Vitamin D deficiency 02/02/2014   Memory impairment 12/12/2013   GERD (gastroesophageal reflux disease)    Essential hypertension    Irritable bowel syndrome 11/06/2009   Hereditary and idiopathic peripheral neuropathy 02/28/2009   Hyperlipidemia with target LDL less than 100 02/27/2009   Allergic rhinitis 02/27/2009   Diverticulosis of colon 02/27/2009   Osteoarthritis 02/27/2009   Myalgia and myositis 02/27/2009   PEPTIC ULCER DISEASE, HX OF 02/27/2009   REFERRING PROVIDER: Eartha Inch, PA   REFERRING DIAG: Pain in left hip   THERAPY DIAG:  Pain in left hip  Muscle weakness (generalized)  Rationale for Evaluation and Treatment: Rehabilitation  ONSET DATE: August 2024  SUBJECTIVE:   SUBJECTIVE STATEMENT: About the same.    PERTINENT HISTORY: Hypertension, macular degeneration, trigeminal  neuralgia syndrome, osteoarthritis, osteopenia, memory deficits, anxiety, and fibromyalgia PAIN:  Are you having pain? Yes: NPRS scale: 2-3/10 Pain location: let lateral hip Pain description: intermittent dull hurt Aggravating factors: moving her left leg, standing (about 20 minutes at most), walking (less than 20 minutes)  Relieving factors: sitting  PRECAUTIONS: None  RED FLAGS: None   WEIGHT BEARING RESTRICTIONS: No  FALLS:  Has patient fallen in last 6 months? No  LIVING ENVIRONMENT: Lives with: lives with their spouse Lives in: House/apartment Stairs: No Has following equipment at home: None  OCCUPATION:  retired  PLOF: Independent  PATIENT GOALS: reduced pain and be able to stand and walk longer   NEXT MD VISIT: around 09/10/23  OBJECTIVE:  Note: Objective measures were completed at Evaluation unless otherwise noted.  PATIENT SURVEYS:  FOTO 42.66  COGNITION: Overall cognitive status: Within functional limits for tasks assessed     SENSATION: Patient reports no numbness or tingling.   EDEMA:  No lower extremity edema observed  PALPATION: TTP: left IT band and TFL  LOWER EXTREMITY ROM: no left hip   Active ROM Right eval Left eval  Hip flexion 100 96  Hip extension    Hip abduction    Hip adduction    Hip internal rotation    Hip external rotation    Knee flexion    Knee extension    Ankle dorsiflexion    Ankle plantarflexion    Ankle inversion    Ankle eversion     (Blank rows = not tested)  LOWER EXTREMITY MMT:  MMT Right eval Left eval  Hip flexion 4-/5 3+/5  Hip extension    Hip abduction    Hip adduction    Hip internal rotation    Hip external rotation    Knee flexion 4-/5 4-/5  Knee extension 3+/5 3+/5  Ankle dorsiflexion    Ankle plantarflexion    Ankle inversion    Ankle eversion     (Blank rows = not tested)  FUNCTIONAL TESTS:  Supine to sit transfer: modA   GAIT: Assistive device utilized: None Level of assistance: Complete Independence Comments: Trendelenburg gait pattern   TODAY'S TREATMENT:                                                                                                                              DATE:    09/08/23:  Nustep level 4 x 10 minutes f/b STW/M x 13 minutes to patient's left lateral hip musculature including ischemic release technique f/b  IFC at 80-150 Hz on 40% scan x 10 minutes. Normal modality response following removal of modality.                                     09/04/23 EXERCISE LOG  Exercise Repetitions and Resistance Comments  Nustep  L3 x 15 minutes   LAQ  Alternating LE  Seated hip  flexion   Alternating LE  RT side lying Hip ER and IR 3x 10 each exercise   Bridging x10   Seated hip ADD isometric     Seated clams     Seated HS curls   LLE only    Blank cell = exercise not performed today  IFC x 15 mins 80-150hz  with HMP to LT hip                                    08/25/23 EXERCISE LOG  Exercise Repetitions and Resistance Comments  NuStep  L2 x 15 minutes   Seated hip ADD isometric  3 minutes w/ 5 second hold    Seated hip flexion  20 reps  LLE only; limited by low back pain   Seated clams  4 minutes    Seated HS curls  Green t-band x 2.5 minutes  LLE only    Blank cell = exercise not performed today   PATIENT EDUCATION:  Education details: POC, healing, and anatomy  Person educated: Patient Education method: Explanation Education comprehension: verbalized understanding  HOME EXERCISE PROGRAM:   ASSESSMENT:  CLINICAL IMPRESSION: Patient remains palpably tender over her left lateral hip musculature and significant pain with transitory movements.  She tolerated treatment without complaint.  We discussed dry needling and she was provided with information and a consent form.  OBJECTIVE IMPAIRMENTS: Abnormal gait, decreased activity tolerance, decreased mobility, difficulty walking, decreased strength, impaired tone, and pain.   ACTIVITY LIMITATIONS: standing, squatting, transfers, bed mobility, and locomotion level  PARTICIPATION LIMITATIONS: meal prep, cleaning, laundry, driving, shopping, and community activity  PERSONAL FACTORS: Age, Past/current experiences, Time since onset of injury/illness/exacerbation, and 3+ comorbidities: Hypertension, macular degeneration, trigeminal neuralgia syndrome, osteoarthritis, osteopenia, memory deficits, anxiety, and fibromyalgia  are also affecting patient's functional outcome.   REHAB POTENTIAL: Fair    CLINICAL DECISION MAKING: Evolving/moderate complexity  EVALUATION COMPLEXITY: Moderate   GOALS: Goals  reviewed with patient? Yes  LONG TERM GOALS: Target date: 09/21/23  Patient will be independent with her HEP.  Baseline:  Goal status: INITIAL  2.  Patient will be able to complete her daily activities without her familiar pain exceeding 6/10. Baseline:  Goal status: INITIAL  3.  Patient will report being able to walk for at least 30 minutes for improved function shopping. Baseline:  Goal status: INITIAL  4.  Patient will be able to ambulate with no significant gait deviations. Baseline:  Goal status: INITIAL  5.  Patient will improve her left hip flexor strength to at least 4-/5 for improved function with her daily activities. Baseline:  Goal status: INITIAL  PLAN:  PT FREQUENCY: 2x/week  PT DURATION: 4 weeks  PLANNED INTERVENTIONS: 97164- PT Re-evaluation, 97110-Therapeutic exercises, 97530- Therapeutic activity, 97112- Neuromuscular re-education, 97535- Self Care, 16109- Manual therapy, [id number]- Gait training, 97014- Electrical stimulation (unattended), 97035- Ultrasound, Patient/Family education, Balance training, Cryotherapy, and Moist heat  PLAN FOR NEXT SESSION: NuStep, lower extremity strengthening, isometrics, manual therapy, and modalities as needed   Emera Bussie, [place], PT 09/08/2023, 5:27 PM

## 2023-09-10 ENCOUNTER — Encounter: Payer: Medicare HMO | Admitting: Physical Therapy

## 2023-09-15 ENCOUNTER — Ambulatory Visit: Payer: Medicare HMO | Admitting: *Deleted

## 2023-09-15 ENCOUNTER — Encounter: Payer: Self-pay | Admitting: *Deleted

## 2023-09-15 DIAGNOSIS — M25552 Pain in left hip: Secondary | ICD-10-CM | POA: Diagnosis not present

## 2023-09-15 DIAGNOSIS — M6281 Muscle weakness (generalized): Secondary | ICD-10-CM

## 2023-09-15 NOTE — Therapy (Addendum)
OUTPATIENT PHYSICAL THERAPY LOWER EXTREMITY TREATMENT   Patient Name: Charlotte Aguirre MRN: [id number] DOB:1946/01/16, 77 y.o., female Today's Date: 09/15/2023  END OF SESSION:  PT End of Session - 09/15/23 1430     Visit Number 6    Number of Visits 8    Date for PT Re-Evaluation 09/25/23    PT Start Time 1432    PT Stop Time 1524    PT Time Calculation (min) 52 min               Past Medical History:  Diagnosis Date   Anxiety    situational anxiety/stress   Arthritis    thumb and fingers   Cataract    bilateral -LEFT sx completed   Fibromyalgia    GERD (gastroesophageal reflux disease)    on meds   Hyperlipidemia    on meds   Hypertension    on meds   IBS (irritable bowel syndrome)    Memory changes    Osteopenia    Peripheral neuropathy    bilateral feet   Vitamin D insufficiency    Past Surgical History:  Procedure Laterality Date   ABDOMINAL HYSTERECTOMY     uterus frist and 10 years later both ovaries removed   APPENDECTOMY     BLADDER SURGERY     bladder tack   BREAST EXCISIONAL BIOPSY     CATARACT EXTRACTION Left 01/23/2022   CATARACT EXTRACTION Right    CHOLECYSTECTOMY     CHOLECYSTECTOMY     COLONOSCOPY N/A 03/22/2014   Procedure: COLONOSCOPY;  Surgeon: Malissa Hippo, MD;  Location: [facility];  Service: Endoscopy;  Laterality: N/A;  930   OOPHORECTOMY     TOE SURGERY Left    2nd toe on RIGHT foot w/pins   WISDOM TOOTH EXTRACTION     Patient Active Problem List   Diagnosis Date Noted   Acute bronchitis 05/12/2023   Cough with fever 05/12/2023   Aortic atherosclerosis (HCC) 01/20/2023   Pseudophakia of both eyes 04/14/2022   Epiretinal membrane, left eye 03/10/2022   Exudative age-related macular degeneration of left eye with active choroidal neovascularization (HCC) 03/10/2022   Intermediate stage nonexudative age-related macular degeneration of both eyes 03/10/2022   Retinal pigment epithelial detachment 03/10/2022   Edema  04/17/2021   Hypokalemia 08/02/2019   Vocal cord atrophy 12/02/2017   Mass of right side of neck 12/02/2017   Morbid obesity (HCC) 04/15/2016   Metabolic syndrome 04/15/2016   Slow transit constipation 07/27/2015   Rectocele 10/31/2014   Osteopenia 09/13/2014   Prediabetes 06/23/2014   Trigeminal neuralgia syndrome 02/02/2014   Vitamin D deficiency 02/02/2014   Memory impairment 12/12/2013   GERD (gastroesophageal reflux disease)    Essential hypertension    Irritable bowel syndrome 11/06/2009   Hereditary and idiopathic peripheral neuropathy 02/28/2009   Hyperlipidemia with target LDL less than 100 02/27/2009   Allergic rhinitis 02/27/2009   Diverticulosis of colon 02/27/2009   Osteoarthritis 02/27/2009   Myalgia and myositis 02/27/2009   PEPTIC ULCER DISEASE, HX OF 02/27/2009   REFERRING PROVIDER: Eartha Inch, PA   REFERRING DIAG: Pain in left hip   THERAPY DIAG:  Pain in left hip  Muscle weakness (generalized)  Rationale for Evaluation and Treatment: Rehabilitation  ONSET DATE: August 2024  SUBJECTIVE:   SUBJECTIVE STATEMENT: LT hip is doing about the same with soreness right now. The more I'm on my feet the more it hurts. To MD in Dec.  PERTINENT HISTORY: Hypertension, macular degeneration,  trigeminal neuralgia syndrome, osteoarthritis, osteopenia, memory deficits, anxiety, and fibromyalgia PAIN:  Are you having pain? Yes: NPRS scale: 2-3/10 Pain location: let lateral hip Pain description: intermittent dull hurt Aggravating factors: moving her left leg, standing (about 20 minutes at most), walking (less than 20 minutes)  Relieving factors: sitting  PRECAUTIONS: None  RED FLAGS: None   WEIGHT BEARING RESTRICTIONS: No  FALLS:  Has patient fallen in last 6 months? No  LIVING ENVIRONMENT: Lives with: lives with their spouse Lives in: House/apartment Stairs: No Has following equipment at home: None  OCCUPATION: retired  PLOF:  Independent  PATIENT GOALS: reduced pain and be able to stand and walk longer   NEXT MD VISIT: around 09/10/23  OBJECTIVE:  Note: Objective measures were completed at Evaluation unless otherwise noted.  PATIENT SURVEYS:  FOTO 42.66  COGNITION: Overall cognitive status: Within functional limits for tasks assessed     SENSATION: Patient reports no numbness or tingling.   EDEMA:  No lower extremity edema observed  PALPATION: TTP: left IT band and TFL  LOWER EXTREMITY ROM: no left hip   Active ROM Right eval Left eval  Hip flexion 100 96  Hip extension    Hip abduction    Hip adduction    Hip internal rotation    Hip external rotation    Knee flexion    Knee extension    Ankle dorsiflexion    Ankle plantarflexion    Ankle inversion    Ankle eversion     (Blank rows = not tested)  LOWER EXTREMITY MMT:  MMT Right eval Left eval  Hip flexion 4-/5 3+/5  Hip extension    Hip abduction    Hip adduction    Hip internal rotation    Hip external rotation    Knee flexion 4-/5 4-/5  Knee extension 3+/5 3+/5  Ankle dorsiflexion    Ankle plantarflexion    Ankle inversion    Ankle eversion     (Blank rows = not tested)  FUNCTIONAL TESTS:  Supine to sit transfer: modA   GAIT: Assistive device utilized: None Level of assistance: Complete Independence Comments: Trendelenburg gait pattern   TODAY'S TREATMENT:                      LT Hip                                                                                                        DATE:    09/15/23:   Nustep level 4 x 15 minutes  STW/M x 12 minutes to patient's left lateral hip musculature including ischemic release technique    IFC at 80-150 Hz on 40% scan x 15 minutes. Normal modality response following removal of modality. Dry Needling from MPT. Trigger Point Dry-Needling  Treatment instructions: Expect mild to moderate muscle soreness. S/S of pneumothorax if dry needled over a lung field, and to seek  immediate medical attention should they occur. Patient verbalized understanding of these instructions and education. Patient Consent Given: Yes Education handout provided: Yes Muscles treated:  Left glut med and TFL                                     09/04/23 EXERCISE LOG  Exercise Repetitions and Resistance Comments  Nustep  L3 x 15 minutes   LAQ  Alternating LE   Seated hip flexion   Alternating LE  RT side lying Hip ER and IR 3x 10 each exercise   Bridging x10   Seated hip ADD isometric     Seated clams     Seated HS curls   LLE only    Blank cell = exercise not performed today  IFC x 15 mins 80-150hz  with HMP to LT hip                                    08/25/23 EXERCISE LOG  Exercise Repetitions and Resistance Comments  NuStep  L2 x 15 minutes   Seated hip ADD isometric  3 minutes w/ 5 second hold    Seated hip flexion  20 reps  LLE only; limited by low back pain   Seated clams  4 minutes    Seated HS curls  Green t-band x 2.5 minutes  LLE only    Blank cell = exercise not performed today   PATIENT EDUCATION:  Education details: POC, healing, and anatomy  Person educated: Patient Education method: Explanation Education comprehension: verbalized understanding  HOME EXERCISE PROGRAM:   ASSESSMENT:  CLINICAL IMPRESSION: Patient arrived today doing about the same with LT hip soreness. She was able to perform therex and did well f/b STW to LT hip and DN from MPT. IFC / HMP to LT hip. Pt reports decreased soreness end of session.      OBJECTIVE IMPAIRMENTS: Abnormal gait, decreased activity tolerance, decreased mobility, difficulty walking, decreased strength, impaired tone, and pain.   ACTIVITY LIMITATIONS: standing, squatting, transfers, bed mobility, and locomotion level  PARTICIPATION LIMITATIONS: meal prep, cleaning, laundry, driving, shopping, and community activity  PERSONAL FACTORS: Age, Past/current experiences, Time since onset of  injury/illness/exacerbation, and 3+ comorbidities: Hypertension, macular degeneration, trigeminal neuralgia syndrome, osteoarthritis, osteopenia, memory deficits, anxiety, and fibromyalgia  are also affecting patient's functional outcome.   REHAB POTENTIAL: Fair    CLINICAL DECISION MAKING: Evolving/moderate complexity  EVALUATION COMPLEXITY: Moderate   GOALS: Goals reviewed with patient? Yes  LONG TERM GOALS: Target date: 09/21/23  Patient will be independent with her HEP.  Baseline:  Goal status: MET  2.  Patient will be able to complete her daily activities without her familiar pain exceeding 6/10. Baseline:  Goal status: On going  3.  Patient will report being able to walk for at least 30 minutes for improved function shopping. Baseline:  Goal status: On going  4.  Patient will be able to ambulate with no significant gait deviations. Baseline:  Goal status: On going  5.  Patient will improve her left hip flexor strength to at least 4-/5 for improved function with her daily activities. Baseline:  Goal status: On going  PLAN:  PT FREQUENCY: 2x/week  PT DURATION: 4 weeks  PLANNED INTERVENTIONS: 97164- PT Re-evaluation, 97110-Therapeutic exercises, 97530- Therapeutic activity, 97112- Neuromuscular re-education, 97535- Self Care, 27253- Manual therapy, [id number]- Gait training, 97014- Electrical stimulation (unattended), 97035- Ultrasound, Patient/Family education, Balance training, Cryotherapy, and Moist heat  PLAN FOR NEXT SESSION: NuStep, lower extremity strengthening,  isometrics, manual therapy, and modalities as needed   Lizzette Carbonell,CHR[place], PTA 09/15/2023, 3:30 PM

## 2023-09-17 ENCOUNTER — Ambulatory Visit: Payer: Medicare HMO | Admitting: Physical Therapy

## 2023-09-17 ENCOUNTER — Encounter: Payer: Self-pay | Admitting: Family

## 2023-09-17 ENCOUNTER — Ambulatory Visit: Payer: Medicare HMO | Admitting: Family

## 2023-09-17 DIAGNOSIS — M25552 Pain in left hip: Secondary | ICD-10-CM

## 2023-09-17 DIAGNOSIS — M6281 Muscle weakness (generalized): Secondary | ICD-10-CM | POA: Diagnosis not present

## 2023-09-17 NOTE — Therapy (Signed)
OUTPATIENT PHYSICAL THERAPY LOWER EXTREMITY TREATMENT   Patient Name: Charlotte Aguirre MRN: [id number] DOB:[birth year], 77 y.o., female Today's Date: 09/17/2023  END OF SESSION:  PT End of Session - 09/17/23 1218     Visit Number 7    Number of Visits 8    Date for PT Re-Evaluation 09/25/23    PT Start Time 1100    PT Stop Time 1157    PT Time Calculation (min) 57 min    Activity Tolerance Patient tolerated treatment well               Past Medical History:  Diagnosis Date   Anxiety    situational anxiety/stress   Arthritis    thumb and fingers   Cataract    bilateral -LEFT sx completed   Fibromyalgia    GERD (gastroesophageal reflux disease)    on meds   Hyperlipidemia    on meds   Hypertension    on meds   IBS (irritable bowel syndrome)    Memory changes    Osteopenia    Peripheral neuropathy    bilateral feet   Vitamin D insufficiency    Past Surgical History:  Procedure Laterality Date   ABDOMINAL HYSTERECTOMY     uterus frist and 10 years later both ovaries removed   APPENDECTOMY     BLADDER SURGERY     bladder tack   BREAST EXCISIONAL BIOPSY     CATARACT EXTRACTION Left 01/23/2022   CATARACT EXTRACTION Right    CHOLECYSTECTOMY     CHOLECYSTECTOMY     COLONOSCOPY N/A 03/22/2014   Procedure: COLONOSCOPY;  Surgeon: Malissa Hippo, MD;  Location: [facility];  Service: Endoscopy;  Laterality: N/A;  930   OOPHORECTOMY     TOE SURGERY Left    2nd toe on RIGHT foot w/pins   WISDOM TOOTH EXTRACTION     Patient Active Problem List   Diagnosis Date Noted   Acute bronchitis 05/12/2023   Cough with fever 05/12/2023   Aortic atherosclerosis (HCC) 01/20/2023   Pseudophakia of both eyes 04/14/2022   Epiretinal membrane, left eye 03/10/2022   Exudative age-related macular degeneration of left eye with active choroidal neovascularization (HCC) 03/10/2022   Intermediate stage nonexudative age-related macular degeneration of both eyes 03/10/2022   Retinal  pigment epithelial detachment 03/10/2022   Edema 04/17/2021   Hypokalemia 08/02/2019   Vocal cord atrophy 12/02/2017   Mass of right side of neck 12/02/2017   Morbid obesity (HCC) 04/15/2016   Metabolic syndrome 04/15/2016   Slow transit constipation 07/27/2015   Rectocele 10/31/2014   Osteopenia 09/13/2014   Prediabetes 06/23/2014   Trigeminal neuralgia syndrome 02/02/2014   Vitamin D deficiency 02/02/2014   Memory impairment 12/12/2013   GERD (gastroesophageal reflux disease)    Essential hypertension    Irritable bowel syndrome 11/06/2009   Hereditary and idiopathic peripheral neuropathy 02/28/2009   Hyperlipidemia with target LDL less than 100 02/27/2009   Allergic rhinitis 02/27/2009   Diverticulosis of colon 02/27/2009   Osteoarthritis 02/27/2009   Myalgia and myositis 02/27/2009   PEPTIC ULCER DISEASE, HX OF 02/27/2009   REFERRING PROVIDER: Eartha Inch, PA   REFERRING DIAG: Pain in left hip   THERAPY DIAG:  Pain in left hip  Rationale for Evaluation and Treatment: Rehabilitation  ONSET DATE: August 2024  SUBJECTIVE:   SUBJECTIVE STATEMENT: Did very well with dry needling and patient reports no pain in areas needled.  She is reporting anterior pain today. PERTINENT HISTORY: Hypertension, macular degeneration,  trigeminal neuralgia syndrome, osteoarthritis, osteopenia, memory deficits, anxiety, and fibromyalgia PAIN:  Are you having pain? Yes: NPRS scale: 2-3/10 Pain location: let lateral hip Pain description: intermittent dull hurt Aggravating factors: moving her left leg, standing (about 20 minutes at most), walking (less than 20 minutes)  Relieving factors: sitting  PRECAUTIONS: None  RED FLAGS: None   WEIGHT BEARING RESTRICTIONS: No  FALLS:  Has patient fallen in last 6 months? No  LIVING ENVIRONMENT: Lives with: lives with their spouse Lives in: House/apartment Stairs: No Has following equipment at home: None  OCCUPATION:  retired  PLOF: Independent  PATIENT GOALS: reduced pain and be able to stand and walk longer   NEXT MD VISIT: around 09/10/23  OBJECTIVE:  Note: Objective measures were completed at Evaluation unless otherwise noted.  PATIENT SURVEYS:  FOTO 42.66  COGNITION: Overall cognitive status: Within functional limits for tasks assessed     SENSATION: Patient reports no numbness or tingling.   EDEMA:  No lower extremity edema observed  PALPATION: TTP: left IT band and TFL  LOWER EXTREMITY ROM: no left hip   Active ROM Right eval Left eval  Hip flexion 100 96  Hip extension    Hip abduction    Hip adduction    Hip internal rotation    Hip external rotation    Knee flexion    Knee extension    Ankle dorsiflexion    Ankle plantarflexion    Ankle inversion    Ankle eversion     (Blank rows = not tested)  LOWER EXTREMITY MMT:  MMT Right eval Left eval  Hip flexion 4-/5 3+/5  Hip extension    Hip abduction    Hip adduction    Hip internal rotation    Hip external rotation    Knee flexion 4-/5 4-/5  Knee extension 3+/5 3+/5  Ankle dorsiflexion    Ankle plantarflexion    Ankle inversion    Ankle eversion     (Blank rows = not tested)  FUNCTIONAL TESTS:  Supine to sit transfer: modA   GAIT: Assistive device utilized: None Level of assistance: Complete Independence Comments: Trendelenburg gait pattern   TODAY'S TREATMENT:                      LT Hip                                                                                                        DATE:     09/17/23;  Nustep level 4 x 12 minutes f/b gentle manual left femoral traction and belt mobs to hip (5 minutes) f/b STW/M x 7 minutes to patient's left anterior hip musculature f/b HMP and IFC at 80-150 Hz on 40% scan x 20 minutes.  Normal modality response following removal of modality.    09/15/23:   Nustep level 4 x 15 minutes  STW/M x 12 minutes to patient's left lateral hip musculature  including ischemic release technique    IFC at 80-150 Hz on 40% scan x 15 minutes. Normal modality response  following removal of modality. Dry Needling from MPT. Trigger Point Dry-Needling  Treatment instructions: Expect mild to moderate muscle soreness. S/S of pneumothorax if dry needled over a lung field, and to seek immediate medical attention should they occur. Patient verbalized understanding of these instructions and education. Patient Consent Given: Yes Education handout provided: Yes Muscles treated: Left glut med and TFL                                     09/04/23 EXERCISE LOG  Exercise Repetitions and Resistance Comments  Nustep  L3 x 15 minutes   LAQ  Alternating LE   Seated hip flexion   Alternating LE  RT side lying Hip ER and IR 3x 10 each exercise   Bridging x10   Seated hip ADD isometric     Seated clams     Seated HS curls   LLE only    Blank cell = exercise not performed today  IFC x 15 mins 80-150hz  with HMP to LT hip                                    08/25/23 EXERCISE LOG  Exercise Repetitions and Resistance Comments  NuStep  L2 x 15 minutes   Seated hip ADD isometric  3 minutes w/ 5 second hold    Seated hip flexion  20 reps  LLE only; limited by low back pain   Seated clams  4 minutes    Seated HS curls  Green t-band x 2.5 minutes  LLE only    Blank cell = exercise not performed today   PATIENT EDUCATION:  Education details: POC, healing, and anatomy  Person educated: Patient Education method: Explanation Education comprehension: verbalized understanding  HOME EXERCISE PROGRAM:   ASSESSMENT:  CLINICAL IMPRESSION: Patient did well with dry needling last session.  She had c/o pain over her left hip flexor and proximal quad region today.  She felt better after treatment but continues to c/o pain during the gait cycle in which her left LE is extended.      OBJECTIVE IMPAIRMENTS: Abnormal gait, decreased activity tolerance, decreased mobility,  difficulty walking, decreased strength, impaired tone, and pain.   ACTIVITY LIMITATIONS: standing, squatting, transfers, bed mobility, and locomotion level  PARTICIPATION LIMITATIONS: meal prep, cleaning, laundry, driving, shopping, and community activity  PERSONAL FACTORS: Age, Past/current experiences, Time since onset of injury/illness/exacerbation, and 3+ comorbidities: Hypertension, macular degeneration, trigeminal neuralgia syndrome, osteoarthritis, osteopenia, memory deficits, anxiety, and fibromyalgia  are also affecting patient's functional outcome.   REHAB POTENTIAL: Fair    CLINICAL DECISION MAKING: Evolving/moderate complexity  EVALUATION COMPLEXITY: Moderate   GOALS: Goals reviewed with patient? Yes  LONG TERM GOALS: Target date: 09/21/23  Patient will be independent with her HEP.  Baseline:  Goal status: MET  2.  Patient will be able to complete her daily activities without her familiar pain exceeding 6/10. Baseline:  Goal status: On going  3.  Patient will report being able to walk for at least 30 minutes for improved function shopping. Baseline:  Goal status: On going  4.  Patient will be able to ambulate with no significant gait deviations. Baseline:  Goal status: On going  5.  Patient will improve her left hip flexor strength to at least 4-/5 for improved function with her daily activities. Baseline:  Goal  status: On going  PLAN:  PT FREQUENCY: 2x/week  PT DURATION: 4 weeks  PLANNED INTERVENTIONS: 97164- PT Re-evaluation, 97110-Therapeutic exercises, 97530- Therapeutic activity, 97112- Neuromuscular re-education, 97535- Self Care, 16109- Manual therapy, [id number]- Gait training, 97014- Electrical stimulation (unattended), 97035- Ultrasound, Patient/Family education, Balance training, Cryotherapy, and Moist heat  PLAN FOR NEXT SESSION: NuStep, lower extremity strengthening, isometrics, manual therapy, and modalities as needed   Charlotte Aguirre, [place],  PT 09/17/2023, 12:19 PM

## 2023-09-22 ENCOUNTER — Ambulatory Visit: Payer: Medicare HMO | Admitting: Physical Therapy

## 2023-09-22 DIAGNOSIS — M6281 Muscle weakness (generalized): Secondary | ICD-10-CM | POA: Diagnosis not present

## 2023-09-22 DIAGNOSIS — M25552 Pain in left hip: Secondary | ICD-10-CM | POA: Diagnosis not present

## 2023-09-22 NOTE — Therapy (Addendum)
OUTPATIENT PHYSICAL THERAPY LOWER EXTREMITY TREATMENT   Patient Name: Charlotte Aguirre MRN: [id number] DOB:Jan 11, 1946, 77 y.o., female Today's Date: 09/22/2023  END OF SESSION:  PT End of Session - 09/22/23 1103     Visit Number 8    Number of Visits 8    Date for PT Re-Evaluation 09/25/23    PT Start Time 1100    PT Stop Time 1148    PT Time Calculation (min) 48 min    Activity Tolerance Patient tolerated treatment well    Behavior During Therapy WFL for tasks assessed/performed               Past Medical History:  Diagnosis Date   Anxiety    situational anxiety/stress   Arthritis    thumb and fingers   Cataract    bilateral -LEFT sx completed   Fibromyalgia    GERD (gastroesophageal reflux disease)    on meds   Hyperlipidemia    on meds   Hypertension    on meds   IBS (irritable bowel syndrome)    Memory changes    Osteopenia    Peripheral neuropathy    bilateral feet   Vitamin D insufficiency    Past Surgical History:  Procedure Laterality Date   ABDOMINAL HYSTERECTOMY     uterus frist and 10 years later both ovaries removed   APPENDECTOMY     BLADDER SURGERY     bladder tack   BREAST EXCISIONAL BIOPSY     CATARACT EXTRACTION Left 01/23/2022   CATARACT EXTRACTION Right    CHOLECYSTECTOMY     CHOLECYSTECTOMY     COLONOSCOPY N/A 03/22/2014   Procedure: COLONOSCOPY;  Surgeon: Malissa Hippo, MD;  Location: [facility];  Service: Endoscopy;  Laterality: N/A;  930   OOPHORECTOMY     TOE SURGERY Left    2nd toe on RIGHT foot w/pins   WISDOM TOOTH EXTRACTION     Patient Active Problem List   Diagnosis Date Noted   Acute bronchitis 05/12/2023   Cough with fever 05/12/2023   Aortic atherosclerosis (HCC) 01/20/2023   Pseudophakia of both eyes 04/14/2022   Epiretinal membrane, left eye 03/10/2022   Exudative age-related macular degeneration of left eye with active choroidal neovascularization (HCC) 03/10/2022   Intermediate stage nonexudative  age-related macular degeneration of both eyes 03/10/2022   Retinal pigment epithelial detachment 03/10/2022   Edema 04/17/2021   Hypokalemia 08/02/2019   Vocal cord atrophy 12/02/2017   Mass of right side of neck 12/02/2017   Morbid obesity (HCC) 04/15/2016   Metabolic syndrome 04/15/2016   Slow transit constipation 07/27/2015   Rectocele 10/31/2014   Osteopenia 09/13/2014   Prediabetes 06/23/2014   Trigeminal neuralgia syndrome 02/02/2014   Vitamin D deficiency 02/02/2014   Memory impairment 12/12/2013   GERD (gastroesophageal reflux disease)    Essential hypertension    Irritable bowel syndrome 11/06/2009   Hereditary and idiopathic peripheral neuropathy 02/28/2009   Hyperlipidemia with target LDL less than 100 02/27/2009   Allergic rhinitis 02/27/2009   Diverticulosis of colon 02/27/2009   Osteoarthritis 02/27/2009   Myalgia and myositis 02/27/2009   PEPTIC ULCER DISEASE, HX OF 02/27/2009   REFERRING PROVIDER: Eartha Inch, PA   REFERRING DIAG: Pain in left hip   THERAPY DIAG:  Pain in left hip  Rationale for Evaluation and Treatment: Rehabilitation  ONSET DATE: August 2024  SUBJECTIVE:   SUBJECTIVE STATEMENT: Pain at a 2 right now but overall she does not feel she has improved.  She did a lot of driving yesterday and was in a lot of pain. PERTINENT HISTORY: Hypertension, macular degeneration, trigeminal neuralgia syndrome, osteoarthritis, osteopenia, memory deficits, anxiety, and fibromyalgia PAIN:  Are you having pain? Yes: NPRS scale: 2/10 Pain location: let lateral hip Pain description: intermittent dull hurt Aggravating factors: moving her left leg, standing (about 20 minutes at most), walking (less than 20 minutes)  Relieving factors: sitting  PRECAUTIONS: None  RED FLAGS: None   WEIGHT BEARING RESTRICTIONS: No  FALLS:  Has patient fallen in last 6 months? No  LIVING ENVIRONMENT: Lives with: lives with their spouse Lives in:  House/apartment Stairs: No Has following equipment at home: None  OCCUPATION: retired  PLOF: Independent  PATIENT GOALS: reduced pain and be able to stand and walk longer   NEXT MD VISIT: around 09/10/23  OBJECTIVE:  Note: Objective measures were completed at Evaluation unless otherwise noted.  PATIENT SURVEYS:  FOTO 42.66  COGNITION: Overall cognitive status: Within functional limits for tasks assessed     SENSATION: Patient reports no numbness or tingling.   EDEMA:  No lower extremity edema observed  PALPATION: TTP: left IT band and TFL  LOWER EXTREMITY ROM: no left hip   Active ROM Right eval Left eval  Hip flexion 100 96  Hip extension    Hip abduction    Hip adduction    Hip internal rotation    Hip external rotation    Knee flexion    Knee extension    Ankle dorsiflexion    Ankle plantarflexion    Ankle inversion    Ankle eversion     (Blank rows = not tested)  LOWER EXTREMITY MMT:  MMT Right eval Left eval  Hip flexion 4-/5 3+/5  Hip extension    Hip abduction    Hip adduction    Hip internal rotation    Hip external rotation    Knee flexion 4-/5 4-/5  Knee extension 3+/5 3+/5  Ankle dorsiflexion    Ankle plantarflexion    Ankle inversion    Ankle eversion     (Blank rows = not tested)  FUNCTIONAL TESTS:  Supine to sit transfer: modA   GAIT: Assistive device utilized: None Level of assistance: Complete Independence Comments: Trendelenburg gait pattern   TODAY'S TREATMENT:                      LT Hip                                                                                                        DATE:    09/22/23:  Nustep level 3 x 10 minutes f/b STW/M x 13 minutes to left lateral hip musculature f/b HMP and IFC at 80-150 Hz on 40% scan x 15 minutes. Normal modality response following removal of modality.     09/17/23;  Nustep level 4 x 12 minutes f/b gentle manual left femoral traction and belt mobs to hip (5 minutes)  f/b STW/M x 7 minutes to patient's left anterior hip musculature f/b HMP and IFC  at 80-150 Hz on 40% scan x 20 minutes.  Normal modality response following removal of modality.    09/15/23:   Nustep level 4 x 15 minutes  STW/M x 12 minutes to patient's left lateral hip musculature including ischemic release technique    IFC at 80-150 Hz on 40% scan x 15 minutes. Normal modality response following removal of modality. Dry Needling from MPT. Trigger Point Dry-Needling  Treatment instructions: Expect mild to moderate muscle soreness. S/S of pneumothorax if dry needled over a lung field, and to seek immediate medical attention should they occur. Patient verbalized understanding of these instructions and education. Patient Consent Given: Yes Education handout provided: Yes Muscles treated: Left glut med and TFL                                     09/04/23 EXERCISE LOG  Exercise Repetitions and Resistance Comments  Nustep  L3 x 15 minutes   LAQ  Alternating LE   Seated hip flexion   Alternating LE  RT side lying Hip ER and IR 3x 10 each exercise   Bridging x10   Seated hip ADD isometric     Seated clams     Seated HS curls   LLE only    Blank cell = exercise not performed today  IFC x 15 mins 80-150hz  with HMP to LT hip                                    08/25/23 EXERCISE LOG  Exercise Repetitions and Resistance Comments  NuStep  L2 x 15 minutes   Seated hip ADD isometric  3 minutes w/ 5 second hold    Seated hip flexion  20 reps  LLE only; limited by low back pain   Seated clams  4 minutes    Seated HS curls  Green t-band x 2.5 minutes  LLE only    Blank cell = exercise not performed today   PATIENT EDUCATION:  Education details: POC, healing, and anatomy  Person educated: Patient Education method: Explanation Education comprehension: verbalized understanding  HOME EXERCISE PROGRAM:   ASSESSMENT:  CLINICAL IMPRESSION:  Patient reporting very high pain-level yesterday  after driving a lot.  Patient with continued left hip pain, especially anterior when her left LE is extended during the gait cycle.     OBJECTIVE IMPAIRMENTS: Abnormal gait, decreased activity tolerance, decreased mobility, difficulty walking, decreased strength, impaired tone, and pain.   ACTIVITY LIMITATIONS: standing, squatting, transfers, bed mobility, and locomotion level  PARTICIPATION LIMITATIONS: meal prep, cleaning, laundry, driving, shopping, and community activity  PERSONAL FACTORS: Age, Past/current experiences, Time since onset of injury/illness/exacerbation, and 3+ comorbidities: Hypertension, macular degeneration, trigeminal neuralgia syndrome, osteoarthritis, osteopenia, memory deficits, anxiety, and fibromyalgia  are also affecting patient's functional outcome.   REHAB POTENTIAL: Fair    CLINICAL DECISION MAKING: Evolving/moderate complexity  EVALUATION COMPLEXITY: Moderate   GOALS: Goals reviewed with patient? Yes  LONG TERM GOALS: Target date: 09/21/23  Patient will be independent with her HEP.  Baseline:  Goal status: MET  2.  Patient will be able to complete her daily activities without her familiar pain exceeding 6/10. Baseline:  Goal status: On going  3.  Patient will report being able to walk for at least 30 minutes for improved function shopping. Baseline:  Goal status: On going  4.  Patient will be able to ambulate with no significant gait deviations. Baseline:  Goal status: On going  5.  Patient will improve her left hip flexor strength to at least 4-/5 for improved function with her daily activities. Baseline:  Goal status: On going  PLAN:  PT FREQUENCY: 2x/week  PT DURATION: 4 weeks  PLANNED INTERVENTIONS: 97164- PT Re-evaluation, 97110-Therapeutic exercises, 97530- Therapeutic activity, 97112- Neuromuscular re-education, 97535- Self Care, 11914- Manual therapy, [id number]- Gait training, 97014- Electrical stimulation (unattended), 97035-  Ultrasound, Patient/Family education, Balance training, Cryotherapy, and Moist heat  PLAN FOR NEXT SESSION: NuStep, lower extremity strengthening, isometrics, manual therapy, and modalities as needed   Tigran Haynie, [place], PT 09/22/2023, 11:52 AM   PHYSICAL THERAPY DISCHARGE SUMMARY  Visits from Start of Care: 8.  Current functional level related to goals / functional outcomes: See above.   Remaining deficits: Continued pain.   Education / Equipment: HEP.   Patient agrees to discharge. Patient goals were partially met. Patient is being discharged due to lack of progress.    [place] Anakin Varkey MPT

## 2023-09-24 ENCOUNTER — Encounter: Payer: Medicare HMO | Admitting: Physical Therapy

## 2023-09-29 ENCOUNTER — Encounter: Payer: Medicare HMO | Admitting: Physical Therapy

## 2023-10-03 ENCOUNTER — Other Ambulatory Visit: Payer: Self-pay | Admitting: Family

## 2023-10-03 DIAGNOSIS — K219 Gastro-esophageal reflux disease without esophagitis: Secondary | ICD-10-CM

## 2023-10-15 DIAGNOSIS — S76312A Strain of muscle, fascia and tendon of the posterior muscle group at thigh level, left thigh, initial encounter: Secondary | ICD-10-CM | POA: Diagnosis not present

## 2023-10-15 DIAGNOSIS — M7062 Trochanteric bursitis, left hip: Secondary | ICD-10-CM | POA: Diagnosis not present

## 2023-10-15 DIAGNOSIS — M6798 Unspecified disorder of synovium and tendon, other site: Secondary | ICD-10-CM | POA: Diagnosis not present

## 2023-10-15 DIAGNOSIS — S73192A Other sprain of left hip, initial encounter: Secondary | ICD-10-CM | POA: Diagnosis not present

## 2023-10-19 ENCOUNTER — Other Ambulatory Visit: Payer: Self-pay | Admitting: Family

## 2023-10-23 ENCOUNTER — Other Ambulatory Visit: Payer: Self-pay | Admitting: Family

## 2023-10-23 DIAGNOSIS — I10 Essential (primary) hypertension: Secondary | ICD-10-CM

## 2023-11-05 ENCOUNTER — Other Ambulatory Visit: Payer: Self-pay | Admitting: Family

## 2023-11-05 DIAGNOSIS — G609 Hereditary and idiopathic neuropathy, unspecified: Secondary | ICD-10-CM

## 2023-11-05 MED ORDER — GABAPENTIN 300 MG PO CAPS
ORAL_CAPSULE | ORAL | 5 refills | Status: DC
Start: 2023-11-05 — End: 2024-05-09

## 2023-11-05 NOTE — Telephone Encounter (Signed)
 Copied from CRM [id number]. Topic: Clinical - Medication Refill >> Nov 05, 2023  9:48 AM Alfonso ORN wrote: Most Recent Primary Care Visit:  Provider: TANDA LEITA CROME  Department: [facility]  Visit Type: MEDICARE AWV, SEQUENTIAL  Date: 06/25/2023  Medication: gabapentin  (NEURONTIN ) 300 MG capsule  Has the patient contacted their pharmacy?  (Agent: If no, request that the patient contact the pharmacy for the refill. If patient does not wish to contact the pharmacy document the reason why and proceed with request.) (Agent: If yes, when and what did the pharmacy advise?)  Is this the correct pharmacy for this prescription? Yes  If no, delete pharmacy and type the correct one.  This is the patient's preferred pharmacy:   [id number] - [place], [place] - [address] [place] [address] Phone: [phone] Fax: [phone]   Has the prescription been filled recently?   Is the patient out of the medication? No   Has the patient been seen for an appointment in the last year OR does the patient have an upcoming appointment? Yes   Can we respond through MyChart? No   Agent: Please be advised that Rx refills may take up to 3 business days. We ask that you follow-up with your pharmacy.

## 2023-11-10 ENCOUNTER — Other Ambulatory Visit: Payer: Self-pay

## 2023-11-10 ENCOUNTER — Encounter: Payer: Self-pay | Admitting: Family Medicine

## 2023-11-10 ENCOUNTER — Ambulatory Visit: Payer: Medicare HMO | Admitting: Family Medicine

## 2023-11-10 VITALS — BP 122/67 | HR 70 | Temp 98.7°F | Ht 61.0 in | Wt 191.0 lb

## 2023-11-10 DIAGNOSIS — M5441 Lumbago with sciatica, right side: Secondary | ICD-10-CM | POA: Diagnosis not present

## 2023-11-10 DIAGNOSIS — N3000 Acute cystitis without hematuria: Secondary | ICD-10-CM

## 2023-11-10 DIAGNOSIS — R3989 Other symptoms and signs involving the genitourinary system: Secondary | ICD-10-CM | POA: Diagnosis not present

## 2023-11-10 DIAGNOSIS — M5442 Lumbago with sciatica, left side: Secondary | ICD-10-CM

## 2023-11-10 DIAGNOSIS — R110 Nausea: Secondary | ICD-10-CM

## 2023-11-10 DIAGNOSIS — B3731 Acute candidiasis of vulva and vagina: Secondary | ICD-10-CM | POA: Diagnosis not present

## 2023-11-10 LAB — URINALYSIS, ROUTINE W REFLEX MICROSCOPIC
Bilirubin, UA: NEGATIVE
Glucose, UA: NEGATIVE
Nitrite, UA: NEGATIVE
Protein,UA: NEGATIVE
RBC, UA: NEGATIVE
Specific Gravity, UA: 1.025 (ref 1.005–1.030)
Urobilinogen, Ur: 0.2 mg/dL (ref 0.2–1.0)
pH, UA: 5 (ref 5.0–7.5)

## 2023-11-10 LAB — MICROSCOPIC EXAMINATION
RBC, Urine: NONE SEEN /[HPF] (ref 0–2)
Renal Epithel, UA: NONE SEEN /[HPF]

## 2023-11-10 MED ORDER — METHYLPREDNISOLONE ACETATE 40 MG/ML IJ SUSP
40.0000 mg | Freq: Once | INTRAMUSCULAR | Status: AC
Start: 2023-11-10 — End: 2023-11-10
  Administered 2023-11-10: 40 mg via INTRAMUSCULAR

## 2023-11-10 MED ORDER — ONDANSETRON HCL 4 MG PO TABS
4.0000 mg | ORAL_TABLET | Freq: Three times a day (TID) | ORAL | 0 refills | Status: DC | PRN
Start: 2023-11-10 — End: 2024-09-09

## 2023-11-10 MED ORDER — CEPHALEXIN 500 MG PO CAPS
500.0000 mg | ORAL_CAPSULE | Freq: Three times a day (TID) | ORAL | 0 refills | Status: AC
Start: 2023-11-10 — End: 2023-11-17

## 2023-11-10 MED ORDER — FLUCONAZOLE 150 MG PO TABS
150.0000 mg | ORAL_TABLET | Freq: Once | ORAL | 0 refills | Status: AC
Start: 2023-11-10 — End: 2023-11-10

## 2023-11-10 MED ORDER — PREDNISONE 10 MG (21) PO TBPK
ORAL_TABLET | ORAL | 0 refills | Status: DC
Start: 2023-11-10 — End: 2024-01-05

## 2023-11-10 NOTE — Patient Instructions (Signed)
 Start prednisone  pack tomorrow Start antibiotic today. You ought to be able to get all 3 doses in today. Try to space them out about every 8 hours starting tomorrow Start diflucan  for yeast today.  Repeat in 3 days Message me if symptoms are not getting better or you need a second round of yeast meds.

## 2023-11-10 NOTE — Progress Notes (Signed)
 Subjective: CC:UTI? [place]? PCP: Lavell Bari LABOR, FNP YEP:Charlotte Aguirre is a 78 y.o. female presenting to clinic today for:  1. UTI Reports that she has had about a 1 week history of bladder pain and then over the last couple of days started having some difficulty starting a stream.  She had to run water  to give the urine specimen today.  She has chronic nausea that flares up intermittently and needs a refill on her nausea pill.  No new flank pain.  No hematuria, fevers or vomiting.  2. Sciatica Has has issues in past. Under care of orthopedics in [place]. Had bilateral corticosteroid injections for bursitis and now being worked up for lumbar pathology. Undergoing PT.  MRI was obtained on 10/15/2023 but she has not heard back with the results yet.  Apparently they are concerned that she has had some type of avulsion of the muscle from the left hip.  However, today she is actually having a right sided sciatica and she wonders if it is because she is favoring the right side due to issues that she has had chronically on the left.  She points to the left hip as the area of most discomfort and it is radiating down the right lower extremity.  She is compliant with gabapentin  3 times daily.  She has gone through dry needling.  None of this has really helped symptoms.  Asking for steroids today to alleviate pain flare   ROS: Per HPI  Allergies  Allergen Reactions   Aspirin     Must take EC, prior history of stomach ulcer. Other reaction(s): Angioedema (ALLERGY/intolerance) Must take EC, prior history of stomach ulcer.   Bactrim  [Sulfamethoxazole -Trimethoprim ] Nausea And Vomiting   Topiramate  Other (See Comments)    hallucinations   Past Medical History:  Diagnosis Date   Anxiety    situational anxiety/stress   Arthritis    thumb and fingers   Cataract    bilateral -LEFT sx completed   Fibromyalgia    GERD (gastroesophageal reflux disease)    on meds   Hyperlipidemia    on meds    Hypertension    on meds   IBS (irritable bowel syndrome)    Memory changes    Osteopenia    Peripheral neuropathy    bilateral feet   Vitamin D  insufficiency     Current Outpatient Medications:    cetirizine  (ZYRTEC ) 5 MG tablet, Take 1 tablet (5 mg total) by mouth daily., Disp: 90 tablet, Rfl: 3   Cholecalciferol (VITAMIN D -3 PO), Take 1 tablet by mouth daily. 2000 units, Disp: , Rfl:    fluticasone  (FLONASE ) 50 MCG/ACT nasal spray, Place 2 sprays into both nostrils daily. (Patient not taking: Reported on 08/24/2023), Disp: 16 g, Rfl: 6   gabapentin  (NEURONTIN ) 300 MG capsule, TAKE 1 CAPSULE BY MOUTH THREE TIMES A DAY, Disp: 90 capsule, Rfl: 5   hydrochlorothiazide  (HYDRODIURIL ) 25 MG tablet, Take 1 tablet (25 mg total) by mouth daily. **NEEDS TO BE SEEN BEFORE NEXT REFILL**, Disp: 30 tablet, Rfl: 0   LINZESS  72 MCG capsule, TAKE 1 CAPSULE BY MOUTH DAILY BEFORE BREAKFAST., Disp: 90 capsule, Rfl: 0   Melatonin 1 MG CAPS, Take 5 mg by mouth., Disp: , Rfl:    Multiple Vitamins-Minerals (PRESERVISION AREDS 2 PO), daily., Disp: , Rfl:    ondansetron  (ZOFRAN ) 4 MG tablet, Take 1 tablet (4 mg total) by mouth every 8 (eight) hours as needed for nausea or vomiting., Disp: 20 tablet, Rfl: 2  pantoprazole  (PROTONIX ) 40 MG tablet, Take 1 tablet (40 mg total) by mouth daily. **NEEDS TO BE SEEN BEFORE NEXT REFILL**, Disp: 30 tablet, Rfl: 0   potassium chloride  (KLOR-CON ) 10 MEQ tablet, TAKE 1 TABLET BY MOUTH EVERY DAY, Disp: 90 tablet, Rfl: 1   simvastatin  (ZOCOR ) 40 MG tablet, TAKE 1 TABLET BY MOUTH EVERY DAY, Disp: 90 tablet, Rfl: 1 Social History   Socioeconomic History   Marital status: Married    Spouse name: Nancyann   Number of children: 2   Years of education: 12   Highest education level: High school graduate  Occupational History   Occupation: Retired    Associate Professor: [employer]  Tobacco Use   Smoking status: Former    Current packs/day: 0.00    Average packs/day: 1.5 packs/day for  30.0 years (45.0 ttl pk-yrs)    Types: Cigarettes    Start date: 11/03/1958    Quit date: 11/03/1988    Years since quitting: 35.0   Smokeless tobacco: Never  Vaping Use   Vaping status: Never Used  Substance and Sexual Activity   Alcohol use: No   Drug use: No   Sexual activity: Yes    Partners: Male  Other Topics Concern   Not on file  Social History Narrative   Retired. Lives with husband Nancyann. 2 sons, do not live locally. One small dog. She enjoys crocheting and quilting. Is involved in her church.   Social Drivers of Corporate Investment Banker Strain: Low Risk  (06/25/2023)   Overall Financial Resource Strain (CARDIA)    Difficulty of Paying Living Expenses: Not hard at all  Food Insecurity: No Food Insecurity (06/25/2023)   Hunger Vital Sign    Worried About Running Out of Food in the Last Year: Never true    Ran Out of Food in the Last Year: Never true  Transportation Needs: No Transportation Needs (06/25/2023)   PRAPARE - Administrator, Civil Service (Medical): No    Lack of Transportation (Non-Medical): No  Physical Activity: Inactive (06/25/2023)   Exercise Vital Sign    Days of Exercise per Week: 0 days    Minutes of Exercise per Session: 0 min  Stress: No Stress Concern Present (06/25/2023)   [employer] of Occupational Health - Occupational Stress Questionnaire    Feeling of Stress : Not at all  Social Connections: Moderately Integrated (06/25/2023)   Social Connection and Isolation Panel [NHANES]    Frequency of Communication with Friends and Family: More than three times a week    Frequency of Social Gatherings with Friends and Family: More than three times a week    Attends Religious Services: More than 4 times per year    Active Member of [employer] or Organizations: No    Attends Banker Meetings: Never    Marital Status: Married  Catering Manager Violence: Not At Risk (06/25/2023)   Humiliation, Afraid, Rape, and Kick questionnaire     Fear of Current or Ex-Partner: No    Emotionally Abused: No    Physically Abused: No    Sexually Abused: No   Family History  Problem Relation Age of Onset   Migraines Mother    Heart failure Mother    Heart failure Father    Heart attack Father    Glaucoma Father    Heart failure Sister    Heart attack Sister    Diabetes Sister    Obesity Sister    COPD Sister  from 2nd hand smoke - never a smoker   Osteoporosis Sister    Colon polyps Sister    Migraines Brother    Dementia Brother    Heart attack Brother    Heart failure Brother    Heart attack Brother    Cancer Brother        LYMPHOMA   Heart attack Brother    Stomach cancer Brother        lymphoma   Cancer Brother        lymphoma-hip   Thyroid  disease Son    Throat cancer Son    Tongue cancer Son    Migraines Nephew    Colon cancer Neg Hx    Esophageal cancer Neg Hx    Rectal cancer Neg Hx    Breast cancer Neg Hx     Objective: Office vital signs reviewed. BP 122/67   Pulse 70   Temp 98.7 F (37.1 C)   Ht 5' 1 (1.549 m)   Wt 191 lb (86.6 kg)   SpO2 95%   BMI 36.09 kg/m   Physical Examination:  General: Awake, alert, nontoxic-appearing female, No acute distress GU: Supra pubic tenderness present.  Right-sided CVA tenderness present. MSK: Antalgic gait and station.  Ambulating independently.  Needs assistance to get up on table due to discomfort.  Assessment/ Plan: 78 y.o. female   Acute cystitis without hematuria - Plan: Urinalysis, Routine w reflex microscopic, Urine Culture, cephALEXin  (KEFLEX ) 500 MG capsule  Yeast vaginitis - Plan: fluconazole  (DIFLUCAN ) 150 MG tablet  Nausea - Plan: ondansetron  (ZOFRAN ) 4 MG tablet  Low back pain due to bilateral sciatica - Plan: predniSONE  (STERAPRED UNI-PAK 21 TAB) 10 MG (21) TBPK tablet, methylPREDNISolone  acetate (DEPO-MEDROL ) injection 40 mg  Keflex  ordered for 3 times daily use given mild right sided CVA tenderness on exam.  Want to cover  for any potential extension into the kidneys.  We discussed red flag signs and symptoms warranting further evaluation.  She requested a refill on her chronic nausea medicine and this was sent over.  Her urine specimen also showed some yeast and so I have ordered Diflucan .  She will take 1 tablet today and then repeat in 3 days.  She has acute on chronic right sided sciatica.  Currently having the left worked up but MRI still pending results at her orthopedist office.  We discussed the risk of recurrent use of corticosteroids including bone loss, hyperglycemia etc.  Depo-Medrol  administered.  Prednisone  Dosepak to be started tomorrow.  Follow-up with orthopedics as directed   Norene CHRISTELLA Fielding, DO [facility] [phone]

## 2023-11-12 LAB — URINE CULTURE

## 2023-11-17 ENCOUNTER — Ambulatory Visit: Payer: Self-pay | Admitting: Family

## 2023-11-17 NOTE — Telephone Encounter (Signed)
 Appt made with [facility] for pelvic

## 2023-11-17 NOTE — Telephone Encounter (Addendum)
 Copied from CRM [id number]. Topic: Clinical - Red Word Triage >> Nov 17, 2023 10:38 AM Chase BROCKS wrote: [facility] that prompted transfer to Nurse Triage: Patient called in stating that she currently has a bladder infection, yeast infection and she has 3 capsules left of the medication cephALEXin  (KEFLEX ) 500 MG capsule however she is still having extreme pain while urinating. She thinks she may needs another medication to help resolve the infection.    Chief Complaint: Bladder infection Symptoms: Painful urination Pertinent Negatives: Patient denies urgency or frequency Disposition: [] ED /[] Urgent Care (no appt availability in office) / [] Appointment(In office/virtual)/ []  [facility] [facility]/ [] Home Care/ [] Refused Recommended Disposition /[] [place] [facility]/ [x]  Follow-up with PCP  Additional Notes: Patient stated that she is currently taking Cephalexin  for a bladder infection. She mentioned that there has been mild improvement. The painful urination is not as severe as before, but it still hurts to urinate. She stated she has been taking the medicine as prescribed and she only has two capsules left. She feels as though her symptoms will not fully resolve after completion of the antibiotics. Patient believes she may need additional treatment. Patient is aware that a message is being sent and the office will follow up with her with recommendations.   Answer Assessment - Initial Assessment Questions 1. MAIN SYMPTOM: What is the main symptom you are concerned about? (e.g., painful urination, urine frequency)     Painful urination  2. BETTER-SAME-WORSE: Are you getting better, staying the same, or getting worse compared to how you felt at your last visit to the doctor (most recent medical visit)?     Better but still having pain  3. OTHER SYMPTOMS: Do you have any other symptoms? (e.g., blood in the urine, flank pain, vaginal discharge)     No  4. ANTIBIOTIC: What antibiotic(s)  are you taking? How many times per day?     Cephalexin  3 times daily  Protocols used: Urinary Tract Infection on Antibiotic Follow-up Call - [facility]

## 2023-11-17 NOTE — Telephone Encounter (Signed)
 Please see my culture result note: "Urine culture was negative for significant growth.  If she is still having symptomology I recommend that she actually have a pelvic exam to further evaluate as her symptoms do not appear to be due to a UTI "

## 2023-11-23 ENCOUNTER — Encounter: Payer: Self-pay | Admitting: Family

## 2023-11-23 ENCOUNTER — Ambulatory Visit (INDEPENDENT_AMBULATORY_CARE_PROVIDER_SITE_OTHER): Payer: Medicare HMO | Admitting: Family

## 2023-11-23 VITALS — BP 139/82 | HR 96 | Temp 97.1°F | Ht 61.0 in | Wt 191.0 lb

## 2023-11-23 DIAGNOSIS — B3731 Acute candidiasis of vulva and vagina: Secondary | ICD-10-CM | POA: Diagnosis not present

## 2023-11-23 DIAGNOSIS — R102 Pelvic and perineal pain: Secondary | ICD-10-CM | POA: Diagnosis not present

## 2023-11-23 LAB — WET PREP FOR TRICH, YEAST, CLUE
Clue Cell Exam: NEGATIVE
Trichomonas Exam: NEGATIVE
Yeast Exam: POSITIVE — AB

## 2023-11-23 MED ORDER — FLUCONAZOLE 150 MG PO TABS
150.0000 mg | ORAL_TABLET | ORAL | 0 refills | Status: DC | PRN
Start: 2023-11-23 — End: 2024-01-05

## 2023-11-23 NOTE — Progress Notes (Signed)
Subjective:    Patient ID: Charlotte Aguirre, female    DOB: 1945/12/08, 78 y.o.   MRN: [id number]  Chief Complaint  Patient presents with   Gynecologic Exam    Pain with urinating Dr. Reece Agar states patient needs pelvic   PT presents to the office today with vaginal burning. She completed keflex on 11/10/23 for a UTI.  Vaginal Pain The patient's pertinent negatives include no genital itching, genital odor or vaginal discharge. This is a new problem. The current episode started in the past 7 days. The pain is mild. Pertinent negatives include no chills, constipation, diarrhea, dysuria, fever, flank pain, frequency or headaches. She has tried antifungals for the symptoms. The treatment provided mild relief.      Review of Systems  Constitutional:  Negative for chills and fever.  Gastrointestinal:  Negative for constipation and diarrhea.  Genitourinary:  Positive for vaginal pain. Negative for dysuria, flank pain, frequency and vaginal discharge.  Neurological:  Negative for headaches.  All other systems reviewed and are negative.      Objective:   Physical Exam Vitals reviewed.  Constitutional:      General: She is not in acute distress.    Appearance: She is well-developed.  HENT:     Head: Normocephalic and atraumatic.     Right Ear: Tympanic membrane normal.     Left Ear: Tympanic membrane normal.  Eyes:     Pupils: Pupils are equal, round, and reactive to light.  Neck:     Thyroid: No thyromegaly.  Cardiovascular:     Rate and Rhythm: Normal rate and regular rhythm.     Heart sounds: Normal heart sounds. No murmur heard. Pulmonary:     Effort: Pulmonary effort is normal. No respiratory distress.     Breath sounds: Normal breath sounds. No wheezing.  Abdominal:     General: Bowel sounds are normal. There is no distension.     Palpations: Abdomen is soft.     Tenderness: There is no abdominal tenderness.  Genitourinary:    Comments: Labia erythemas, no  discharge Musculoskeletal:        General: No tenderness. Normal range of motion.     Cervical back: Normal range of motion and neck supple.  Skin:    General: Skin is warm and dry.  Neurological:     Mental Status: She is alert and oriented to person, place, and time.     Cranial Nerves: No cranial nerve deficit.     Deep Tendon Reflexes: Reflexes are normal and symmetric.  Psychiatric:        Behavior: Behavior normal.        Thought Content: Thought content normal.        Judgment: Judgment normal.     BP 139/82   Pulse 96   Temp (!) 97.1 F (36.2 C) (Temporal)   Ht 5\' 1"  (1.549 m)   Wt 191 lb (86.6 kg)   SpO2 96%   BMI 36.09 kg/m        Assessment & Plan:  Charlotte Aguirre comes in today with chief complaint of Gynecologic Exam (Pain with urinating Dr. Reece Agar states patient needs pelvic)   Diagnosis and orders addressed:  1. Vaginal pain (Primary) - WET PREP FOR TRICH, YEAST, CLUE  2. Vagina, candidiasis Keep clean and dry Avoid itching  Diflucan  Follow up if symptoms worsen or do not improve  - fluconazole (DIFLUCAN) 150 MG tablet; Take 1 tablet (150 mg total) by mouth every  three (3) days as needed.  Dispense: 3 tablet; Refill: 0    Jannifer Rodney, FNP

## 2023-11-23 NOTE — Patient Instructions (Signed)

## 2023-11-26 DIAGNOSIS — M7062 Trochanteric bursitis, left hip: Secondary | ICD-10-CM | POA: Diagnosis not present

## 2023-11-26 DIAGNOSIS — M25552 Pain in left hip: Secondary | ICD-10-CM | POA: Diagnosis not present

## 2023-12-04 ENCOUNTER — Ambulatory Visit: Payer: Medicare HMO | Admitting: Neurology

## 2023-12-09 ENCOUNTER — Other Ambulatory Visit: Payer: Self-pay | Admitting: Family

## 2023-12-09 DIAGNOSIS — E876 Hypokalemia: Secondary | ICD-10-CM

## 2024-01-03 ENCOUNTER — Other Ambulatory Visit: Payer: Self-pay | Admitting: Family

## 2024-01-03 DIAGNOSIS — K219 Gastro-esophageal reflux disease without esophagitis: Secondary | ICD-10-CM

## 2024-01-04 MED ORDER — PANTOPRAZOLE SODIUM 40 MG PO TBEC
40.0000 mg | DELAYED_RELEASE_TABLET | Freq: Every day | ORAL | 0 refills | Status: DC
Start: 2024-01-04 — End: 2024-01-05

## 2024-01-04 NOTE — Telephone Encounter (Signed)
 I called pt & made her an appt w/[facility] on 01-05-2024 for Med refill.

## 2024-01-04 NOTE — Addendum Note (Signed)
 Addended by: Julious Payer D on: 01/04/2024 10:35 AM   Modules accepted: Orders

## 2024-01-04 NOTE — Telephone Encounter (Signed)
 [facility] pt NTBS 30-d given 10/23/23

## 2024-01-05 ENCOUNTER — Ambulatory Visit (INDEPENDENT_AMBULATORY_CARE_PROVIDER_SITE_OTHER): Admitting: Family

## 2024-01-05 ENCOUNTER — Encounter: Payer: Self-pay | Admitting: Family

## 2024-01-05 VITALS — BP 119/66 | HR 65 | Temp 97.2°F | Ht 61.0 in | Wt 192.4 lb

## 2024-01-05 DIAGNOSIS — K581 Irritable bowel syndrome with constipation: Secondary | ICD-10-CM

## 2024-01-05 DIAGNOSIS — I10 Essential (primary) hypertension: Secondary | ICD-10-CM | POA: Diagnosis not present

## 2024-01-05 DIAGNOSIS — E876 Hypokalemia: Secondary | ICD-10-CM

## 2024-01-05 DIAGNOSIS — Z0001 Encounter for general adult medical examination with abnormal findings: Secondary | ICD-10-CM | POA: Diagnosis not present

## 2024-01-05 DIAGNOSIS — E785 Hyperlipidemia, unspecified: Secondary | ICD-10-CM | POA: Diagnosis not present

## 2024-01-05 DIAGNOSIS — K5901 Slow transit constipation: Secondary | ICD-10-CM

## 2024-01-05 DIAGNOSIS — I700 Atherosclerosis of aorta: Secondary | ICD-10-CM | POA: Diagnosis not present

## 2024-01-05 DIAGNOSIS — M160 Bilateral primary osteoarthritis of hip: Secondary | ICD-10-CM

## 2024-01-05 DIAGNOSIS — Z0000 Encounter for general adult medical examination without abnormal findings: Secondary | ICD-10-CM | POA: Diagnosis not present

## 2024-01-05 DIAGNOSIS — R7303 Prediabetes: Secondary | ICD-10-CM

## 2024-01-05 DIAGNOSIS — M25552 Pain in left hip: Secondary | ICD-10-CM

## 2024-01-05 DIAGNOSIS — K219 Gastro-esophageal reflux disease without esophagitis: Secondary | ICD-10-CM

## 2024-01-05 DIAGNOSIS — Z6836 Body mass index (BMI) 36.0-36.9, adult: Secondary | ICD-10-CM | POA: Diagnosis not present

## 2024-01-05 LAB — BAYER DCA HB A1C WAIVED: HB A1C (BAYER DCA - WAIVED): 5.5 % (ref 4.8–5.6)

## 2024-01-05 MED ORDER — POTASSIUM CHLORIDE ER 10 MEQ PO TBCR: 10.0000 meq | EXTENDED_RELEASE_TABLET | Freq: Every day | ORAL | 1 refills | Status: DC

## 2024-01-05 MED ORDER — PANTOPRAZOLE SODIUM 40 MG PO TBEC: 40.0000 mg | DELAYED_RELEASE_TABLET | Freq: Every day | ORAL | 1 refills | Status: DC

## 2024-01-05 MED ORDER — HYDROCHLOROTHIAZIDE 25 MG PO TABS
25.0000 mg | ORAL_TABLET | Freq: Every day | ORAL | 1 refills | Status: DC
Start: 2024-01-05 — End: 2024-05-09

## 2024-01-05 NOTE — Progress Notes (Signed)
 Subjective:    Patient ID: Charlotte Aguirre, female    DOB: Oct 12, 1946, 78 y.o.   MRN: [id number]  Chief Complaint  Patient presents with   Medical Management of Chronic Issues   Leg Pain    Left leg    Pt presents to the office today for CPE and chronic follow up. She is followed by GI for GERD, IBS,  diverticulosis, and abdominal pain as needed. These are stable.    She is followed by Cardiologists for HTN and palpitations as needed.  She is followed by Neurologists every 6 months for primary stabbing headache.    She is morbid obese with a BMI of 36 and hypertension and hyperlipidemia.   She is followed by Ortho for left hip pain. Diagnosed with bursitis.  Hypertension This is a chronic problem. The current episode started more than 1 year ago. The problem has been resolved since onset. The problem is controlled. Associated symptoms include malaise/fatigue. Pertinent negatives include no blurred vision, peripheral edema or shortness of breath. Risk factors for coronary artery disease include dyslipidemia, obesity and sedentary lifestyle. The current treatment provides moderate improvement.  Gastroesophageal Reflux She complains of belching and heartburn. This is a chronic problem. The current episode started more than 1 year ago. The problem occurs rarely. Risk factors include obesity. She has tried a PPI for the symptoms. The treatment provided moderate relief.  Arthritis Presents for follow-up visit. She complains of pain and stiffness. The symptoms have been stable. Affected locations include the left knee, right knee, right MCP, left MCP, left hip and left wrist (back). Her pain is at a severity of 4/10 (hip is 8-9 at times).  Hyperlipidemia This is a chronic problem. The current episode started more than 1 year ago. The problem is controlled. Exacerbating diseases include obesity. Associated symptoms include leg pain. Pertinent negatives include no shortness of breath. Current  antihyperlipidemic treatment includes statins. The current treatment provides moderate improvement of lipids. Risk factors for coronary artery disease include dyslipidemia, hypertension, a sedentary lifestyle and post-menopausal.  Constipation This is a chronic problem. The current episode started more than 1 year ago. The problem has been resolved since onset. Her stool frequency is 2 to 3 times per week. Risk factors include obesity. She has tried laxatives for the symptoms. The treatment provided mild relief.  Leg Pain   Diabetes Diabetes type: prediabetic. Pertinent negatives for diabetes include no blurred vision and no foot paresthesias. Risk factors for coronary artery disease include dyslipidemia, hypertension, sedentary lifestyle and post-menopausal. (Does not check glucose at home)      Review of Systems  Constitutional:  Positive for malaise/fatigue.  Eyes:  Negative for blurred vision.  Respiratory:  Negative for shortness of breath.   Gastrointestinal:  Positive for constipation and heartburn.  Musculoskeletal:  Positive for stiffness.  All other systems reviewed and are negative.  Family History  Problem Relation Age of Onset   Migraines Mother    Heart failure Mother    Heart failure Father    Heart attack Father    Glaucoma Father    Heart failure Sister    Heart attack Sister    Diabetes Sister    Obesity Sister    COPD Sister        from 2nd hand smoke - never a smoker   Osteoporosis Sister    Colon polyps Sister    Migraines Brother    Dementia Brother    Heart attack Brother  Heart failure Brother    Heart attack Brother    Cancer Brother        LYMPHOMA   Heart attack Brother    Stomach cancer Brother        lymphoma   Cancer Brother        lymphoma-hip   Thyroid disease Son    Throat cancer Son    Tongue cancer Son    Migraines Nephew    Colon cancer Neg Hx    Esophageal cancer Neg Hx    Rectal cancer Neg Hx    Breast cancer Neg Hx     Social History   Socioeconomic History   Marital status: Married    Spouse name: Dorinda Hill   Number of children: 2   Years of education: 12   Highest education level: High school graduate  Occupational History   Occupation: Retired    Associate Professor: [employer]  Tobacco Use   Smoking status: Former    Current packs/day: 0.00    Average packs/day: 1.5 packs/day for 30.0 years (45.0 ttl pk-yrs)    Types: Cigarettes    Start date: 11/03/1958    Quit date: 11/03/1988    Years since quitting: 35.1   Smokeless tobacco: Never  Vaping Use   Vaping status: Never Used  Substance and Sexual Activity   Alcohol use: No   Drug use: No   Sexual activity: Yes    Partners: Male  Other Topics Concern   Not on file  Social History Narrative   Retired. Lives with husband Dorinda Hill. 2 sons, do not live locally. One small dog. She enjoys crocheting and quilting. Is involved in her church.   Social Drivers of Corporate investment banker Strain: Low Risk  (06/25/2023)   Overall Financial Resource Strain (CARDIA)    Difficulty of Paying Living Expenses: Not hard at all  Food Insecurity: No Food Insecurity (06/25/2023)   Hunger Vital Sign    Worried About Running Out of Food in the Last Year: Never true    Ran Out of Food in the Last Year: Never true  Transportation Needs: No Transportation Needs (06/25/2023)   PRAPARE - Administrator, Civil Service (Medical): No    Lack of Transportation (Non-Medical): No  Physical Activity: Inactive (06/25/2023)   Exercise Vital Sign    Days of Exercise per Week: 0 days    Minutes of Exercise per Session: 0 min  Stress: No Stress Concern Present (06/25/2023)   [employer] of Occupational Health - Occupational Stress Questionnaire    Feeling of Stress : Not at all  Social Connections: Moderately Integrated (06/25/2023)   Social Connection and Isolation Panel [NHANES]    Frequency of Communication with Friends and Family: More than three times a  week    Frequency of Social Gatherings with Friends and Family: More than three times a week    Attends Religious Services: More than 4 times per year    Active Member of [employer] or Organizations: No    Attends Banker Meetings: Never    Marital Status: Married       Objective:   Physical Exam Vitals reviewed.  Constitutional:      General: She is not in acute distress.    Appearance: She is well-developed. She is obese.  HENT:     Head: Normocephalic and atraumatic.     Right Ear: Tympanic membrane normal.     Left Ear: Tympanic membrane normal.  Eyes:  Pupils: Pupils are equal, round, and reactive to light.  Neck:     Thyroid: No thyromegaly.  Cardiovascular:     Rate and Rhythm: Normal rate and regular rhythm.     Heart sounds: Normal heart sounds. No murmur heard. Pulmonary:     Effort: Pulmonary effort is normal. No respiratory distress.     Breath sounds: Normal breath sounds. No wheezing.  Abdominal:     General: Bowel sounds are normal. There is no distension.     Palpations: Abdomen is soft.     Tenderness: There is no abdominal tenderness.  Musculoskeletal:        General: No tenderness. Normal range of motion.     Cervical back: Normal range of motion and neck supple.     Comments: Full ROM of left hip, pain with standing and walking  Skin:    General: Skin is warm and dry.  Neurological:     Mental Status: She is alert and oriented to person, place, and time.     Cranial Nerves: No cranial nerve deficit.     Deep Tendon Reflexes: Reflexes are normal and symmetric.  Psychiatric:        Behavior: Behavior normal.        Thought Content: Thought content normal.        Judgment: Judgment normal.          BP 119/66   Pulse 65   Temp (!) 97.2 F (36.2 C) (Temporal)   Ht 5\' 1"  (1.549 m)   Wt 192 lb 6.4 oz (87.3 kg)   SpO2 99%   BMI 36.35 kg/m   Assessment & Plan:  Jimmie Molly No comes in today with chief complaint of Medical Management  of Chronic Issues and Leg Pain (Left leg )   Diagnosis and orders addressed:  1. Essential hypertension - hydrochlorothiazide (HYDRODIURIL) 25 MG tablet; Take 1 tablet (25 mg total) by mouth daily.  Dispense: 90 tablet; Refill: 1 - CMP14+EGFR - CBC with Differential/Platelet  2. Gastroesophageal reflux disease without esophagitis - pantoprazole (PROTONIX) 40 MG tablet; Take 1 tablet (40 mg total) by mouth daily.  Dispense: 90 tablet; Refill: 1 - CMP14+EGFR - CBC with Differential/Platelet  3. Hypokalemia - potassium chloride (KLOR-CON) 10 MEQ tablet; Take 1 tablet (10 mEq total) by mouth daily.  Dispense: 90 tablet; Refill: 1 - CMP14+EGFR - CBC with Differential/Platelet  4. Annual physical exam (Primary)  - CMP14+EGFR - CBC with Differential/Platelet - Lipid panel - TSH - Bayer DCA Hb A1c Waived - Magnesium  5. Hyperlipidemia with target LDL less than 100 - CMP14+EGFR - CBC with Differential/Platelet - Lipid panel  6. Irritable bowel syndrome with constipation - CMP14+EGFR - CBC with Differential/Platelet  7. Primary osteoarthritis of both hips - CMP14+EGFR - CBC with Differential/Platelet  8. Slow transit constipation - CMP14+EGFR - CBC with Differential/Platelet  9. Morbid obesity (HCC) - CMP14+EGFR - CBC with Differential/Platelet  10. Aortic atherosclerosis (HCC) - CMP14+EGFR - CBC with Differential/Platelet  11. Pain of left hip joint - CMP14+EGFR - CBC with Differential/Platelet  12. Prediabetes - CMP14+EGFR - CBC with Differential/Platelet - Bayer DCA Hb A1c Waived   Labs pending Continue current medications  Health Maintenance reviewed Diet and exercise encouraged  Follow up plan: 6 months    Jannifer Rodney, FNP

## 2024-01-05 NOTE — Patient Instructions (Signed)
 Health Maintenance After Age 79 After age 4, you are at a higher risk for certain long-term diseases and infections as well as injuries from falls. Falls are a major cause of broken bones and head injuries in people who are older than age 47. Getting regular preventive care can help to keep you healthy and well. Preventive care includes getting regular testing and making lifestyle changes as recommended by your health care provider. Talk with your health care provider about: Which screenings and tests you should have. A screening is a test that checks for a disease when you have no symptoms. A diet and exercise plan that is right for you. What should I know about screenings and tests to prevent falls? Screening and testing are the best ways to find a health problem early. Early diagnosis and treatment give you the best chance of managing medical conditions that are common after age 37. Certain conditions and lifestyle choices may make you more likely to have a fall. Your health care provider may recommend: Regular vision checks. Poor vision and conditions such as cataracts can make you more likely to have a fall. If you wear glasses, make sure to get your prescription updated if your vision changes. Medicine review. Work with your health care provider to regularly review all of the medicines you are taking, including over-the-counter medicines. Ask your health care provider about any side effects that may make you more likely to have a fall. Tell your health care provider if any medicines that you take make you feel dizzy or sleepy. Strength and balance checks. Your health care provider may recommend certain tests to check your strength and balance while standing, walking, or changing positions. Foot health exam. Foot pain and numbness, as well as not wearing proper footwear, can make you more likely to have a fall. Screenings, including: Osteoporosis screening. Osteoporosis is a condition that causes  the bones to get weaker and break more easily. Blood pressure screening. Blood pressure changes and medicines to control blood pressure can make you feel dizzy. Depression screening. You may be more likely to have a fall if you have a fear of falling, feel depressed, or feel unable to do activities that you used to do. Alcohol use screening. Using too much alcohol can affect your balance and may make you more likely to have a fall. Follow these instructions at home: Lifestyle Do not drink alcohol if: Your health care provider tells you not to drink. If you drink alcohol: Limit how much you have to: 0-1 drink a day for women. 0-2 drinks a day for men. Know how much alcohol is in your drink. In the [place] one drink equals one 12 oz bottle of beer (355 mL), one 5 oz glass of wine (148 mL), or one 1 oz glass of hard liquor (44 mL). Do not use any products that contain nicotine or tobacco. These products include cigarettes, chewing tobacco, and vaping devices, such as e-cigarettes. If you need help quitting, ask your health care provider. Activity  Follow a regular exercise program to stay fit. This will help you maintain your balance. Ask your health care provider what types of exercise are appropriate for you. If you need a cane or walker, use it as recommended by your health care provider. Wear supportive shoes that have nonskid soles. Safety  Remove any tripping hazards, such as rugs, cords, and clutter. Install safety equipment such as grab bars in bathrooms and safety rails on stairs. Keep rooms and walkways  well-lit. General instructions Talk with your health care provider about your risks for falling. Tell your health care provider if: You fall. Be sure to tell your health care provider about all falls, even ones that seem minor. You feel dizzy, tiredness (fatigue), or off-balance. Take over-the-counter and prescription medicines only as told by your health care provider. These include  supplements. Eat a healthy diet and maintain a healthy weight. A healthy diet includes low-fat dairy products, low-fat (lean) meats, and fiber from whole grains, beans, and lots of fruits and vegetables. Stay current with your vaccines. Schedule regular health, dental, and eye exams. Summary Having a healthy lifestyle and getting preventive care can help to protect your health and wellness after age 11. Screening and testing are the best way to find a health problem early and help you avoid having a fall. Early diagnosis and treatment give you the best chance for managing medical conditions that are more common for people who are older than age 28. Falls are a major cause of broken bones and head injuries in people who are older than age 48. Take precautions to prevent a fall at home. Work with your health care provider to learn what changes you can make to improve your health and wellness and to prevent falls. This information is not intended to replace advice given to you by your health care provider. Make sure you discuss any questions you have with your health care provider. Document Revised: 03/11/2021 Document Reviewed: 03/11/2021 Elsevier Patient Education  2024 [employer].

## 2024-01-06 ENCOUNTER — Other Ambulatory Visit: Payer: Self-pay | Admitting: Family

## 2024-01-06 DIAGNOSIS — Z1231 Encounter for screening mammogram for malignant neoplasm of breast: Secondary | ICD-10-CM

## 2024-01-06 LAB — CMP14+EGFR
ALT: 9 IU/L (ref 0–32)
AST: 14 IU/L (ref 0–40)
Albumin: 3.9 g/dL (ref 3.8–4.8)
Alkaline Phosphatase: 127 IU/L — ABNORMAL HIGH (ref 44–121)
BUN/Creatinine Ratio: 19 (ref 12–28)
BUN: 12 mg/dL (ref 8–27)
Bilirubin Total: 0.2 mg/dL (ref 0.0–1.2)
CO2: 28 mmol/L (ref 20–29)
Calcium: 9.4 mg/dL (ref 8.7–10.3)
Chloride: 102 mmol/L (ref 96–106)
Creatinine, Ser: 0.64 mg/dL (ref 0.57–1.00)
Globulin, Total: 2 g/dL (ref 1.5–4.5)
Glucose: 95 mg/dL (ref 70–99)
Potassium: 3.5 mmol/L (ref 3.5–5.2)
Sodium: 144 mmol/L (ref 134–144)
Total Protein: 5.9 g/dL — ABNORMAL LOW (ref 6.0–8.5)
eGFR: 90 mL/min/{1.73_m2} (ref >59)

## 2024-01-06 LAB — CBC WITH DIFFERENTIAL/PLATELET
Basophils Absolute: 0.1 10*3/uL (ref 0.0–0.2)
Basos: 1 %
EOS (ABSOLUTE): 0.1 10*3/uL (ref 0.0–0.4)
Eos: 1 %
Hematocrit: 45 % (ref 34.0–46.6)
Hemoglobin: 14.3 g/dL (ref 11.1–15.9)
Immature Grans (Abs): 0 10*3/uL (ref 0.0–0.1)
Immature Granulocytes: 0 %
Lymphocytes Absolute: 2.7 10*3/uL (ref 0.7–3.1)
Lymphs: 28 %
MCH: 27.3 pg (ref 26.6–33.0)
MCHC: 31.8 g/dL (ref 31.5–35.7)
MCV: 86 fL (ref 79–97)
Monocytes Absolute: 0.6 10*3/uL (ref 0.1–0.9)
Monocytes: 6 %
Neutrophils Absolute: 6 10*3/uL (ref 1.4–7.0)
Neutrophils: 64 %
Platelets: 273 10*3/uL (ref 150–450)
RBC: 5.24 x10E6/uL (ref 3.77–5.28)
RDW: 13.4 % (ref 11.7–15.4)
WBC: 9.4 10*3/uL (ref 3.4–10.8)

## 2024-01-06 LAB — LIPID PANEL
Chol/HDL Ratio: 4.4 ratio (ref 0.0–4.4)
Cholesterol, Total: 167 mg/dL (ref 100–199)
HDL: 38 mg/dL — ABNORMAL LOW (ref >39)
LDL Chol Calc (NIH): 92 mg/dL (ref 0–99)
Triglycerides: 216 mg/dL — ABNORMAL HIGH (ref 0–149)
VLDL Cholesterol Cal: 37 mg/dL (ref 5–40)

## 2024-01-06 LAB — MAGNESIUM: Magnesium: 1.7 mg/dL (ref 1.6–2.3)

## 2024-01-06 LAB — TSH: TSH: 3.33 u[IU]/mL (ref 0.450–4.500)

## 2024-01-11 ENCOUNTER — Inpatient Hospital Stay: Admission: RE | Admit: 2024-01-11 | Source: Ambulatory Visit

## 2024-01-27 ENCOUNTER — Ambulatory Visit
Admission: RE | Admit: 2024-01-27 | Discharge: 2024-01-27 | Disposition: A | Discharge status: Home / Self Care | Source: Ambulatory Visit | Attending: Family | Admitting: Family

## 2024-01-27 DIAGNOSIS — Z1231 Encounter for screening mammogram for malignant neoplasm of breast: Secondary | ICD-10-CM

## 2024-01-28 DIAGNOSIS — M25552 Pain in left hip: Secondary | ICD-10-CM | POA: Diagnosis not present

## 2024-02-03 DIAGNOSIS — M545 Low back pain, unspecified: Secondary | ICD-10-CM | POA: Diagnosis not present

## 2024-02-03 DIAGNOSIS — M533 Sacrococcygeal disorders, not elsewhere classified: Secondary | ICD-10-CM | POA: Diagnosis not present

## 2024-02-04 DIAGNOSIS — M533 Sacrococcygeal disorders, not elsewhere classified: Secondary | ICD-10-CM | POA: Diagnosis not present

## 2024-02-24 DIAGNOSIS — M533 Sacrococcygeal disorders, not elsewhere classified: Secondary | ICD-10-CM | POA: Diagnosis not present

## 2024-02-24 DIAGNOSIS — M545 Low back pain, unspecified: Secondary | ICD-10-CM | POA: Diagnosis not present

## 2024-03-16 NOTE — Progress Notes (Unsigned)
 Cardiology Office Note:   Date:  03/17/2024  ID:  Charlotte Aguirre, DOB Dec 20, 1945, MRN [id number] PCP: Yevette Hem, FNP  [facility] Providers Cardiologist:  Eilleen Grates, MD {  History of Present Illness:   Charlotte Aguirre is a 78 y.o. female who is referred by Yevette Hem, FNP for evaluation of palpitations. She did see Dr. Stann Earnest in 2010.   She had palpitations and was treated with beta blocker.  She had a normal echo.  She had no sustained arrhythmia but was noted to have occasional ectopic beats.  She had a perfusion study 14 years ago.  I saw her for this in Sept 2020.  She wore a Holter and she had brief runs of SVT.  I last saw her in 2023.  She had precordial chest pain.  She had mild coronary plaque on CT.    She wanted to come back because she saw commercials of that said if you have dizziness and shortness of breath present to your cardiologist.  She has a couple episodes of her head spinning sometimes but these are sporadic.  She is not describing orthostatic symptoms.  She has occasional palpitations for which she takes propranolol  which has been a chronic problem.  The propranolol  seems to help.  They are not associated with the dizziness.  She has not had any presyncope or syncope.  She has some shortness of breath if she walks up an incline in her backyard from the barn but she rarely does this and it is not an activity she is used to.  She did this recently and had to stop a little bit to recover.  She routinely walks around a long driveway with her dog and does not get shortness of breath with this.  She does not have any chest pressure, neck or arm discomfort.  She does not have any PND or orthopnea.  She really has had no new change in symptoms since her 2023 CT demonstrated mild coronary plaque  ROS: As stated in the HPI and negative for all other systems.  Studies Reviewed:    EKG:   EKG Interpretation Date/Time:  Thursday Mar 17 2024 10:36:50 EDT Ventricular  Rate:  74 PR Interval:  146 QRS Duration:  74 QT Interval:  380 QTC Calculation: 421 R Axis:   23  Text Interpretation: Normal sinus rhythm When compared with ECG of 09-Feb-2023 00:47, No significant change since last tracing Confirmed by Eilleen Grates ([id number]) on 03/17/2024 11:05:03 AM    Risk Assessment/Calculations:      Physical Exam:   VS:  BP 134/80   Pulse 74   Ht 5\' 1"  (1.549 m)   Wt 185 lb (83.9 kg)   SpO2 98%   BMI 34.96 kg/m    Wt Readings from Last 3 Encounters:  03/17/24 185 lb (83.9 kg)  01/05/24 192 lb 6.4 oz (87.3 kg)  11/23/23 191 lb (86.6 kg)     GEN: Well nourished, well developed in no acute distress NECK: No JVD; No carotid bruits CARDIAC: RRR, no murmurs, rubs, gallops RESPIRATORY:  Clear to auscultation without rales, wheezing or rhonchi  ABDOMEN: Soft, non-tender, non-distended EXTREMITIES:  No edema; No deformity   ASSESSMENT AND PLAN:   PALPITATIONS:   She has some stable SVT managed with propranolol .  I will renew this prescription.  No further testing.   SOB: I think her shortness of breath is not a new anginal equivalent.  It is probably a little deconditioning  and I do not think further workup is indicated.  She will come back to me if this worsens.  DIZZINESS: This is mild and sporadic.  No change in therapy or further workup.     Follow up with me as needed.   Signed, Eilleen Grates, MD

## 2024-03-17 ENCOUNTER — Ambulatory Visit (INDEPENDENT_AMBULATORY_CARE_PROVIDER_SITE_OTHER): Admitting: Cardiology

## 2024-03-17 ENCOUNTER — Encounter: Payer: Self-pay | Admitting: Cardiology

## 2024-03-17 VITALS — BP 134/80 | HR 74 | Ht 61.0 in | Wt 185.0 lb

## 2024-03-17 DIAGNOSIS — R002 Palpitations: Secondary | ICD-10-CM

## 2024-03-17 MED ORDER — PROPRANOLOL HCL 10 MG PO TABS: 10.0000 mg | ORAL_TABLET | Freq: Three times a day (TID) | ORAL | 3 refills | Status: AC | PRN

## 2024-03-17 NOTE — Patient Instructions (Signed)
 Medication Instructions:   Your physician recommends that you continue on your current medications as directed. Please refer to the Current Medication list given to you today.    Take Propranolol  10 mg,  three times a day as needed.  Labwork: none  Testing/Procedures: none  Follow-Up: As needed  Any Other Special Instructions Will Be Listed Below (If Applicable).  If you need a refill on your cardiac medications before your next appointment, please call your pharmacy.   Thank you for choosing [facility] !

## 2024-03-21 ENCOUNTER — Ambulatory Visit: Admitting: Nurse Practitioner

## 2024-03-21 ENCOUNTER — Encounter: Payer: Self-pay | Admitting: Nurse Practitioner

## 2024-03-21 VITALS — BP 118/74 | HR 69 | Temp 97.2°F | Ht 61.0 in | Wt 190.6 lb

## 2024-03-21 DIAGNOSIS — D1779 Benign lipomatous neoplasm of other sites: Secondary | ICD-10-CM | POA: Diagnosis not present

## 2024-03-21 NOTE — Progress Notes (Signed)
 Acute Office Visit  Subjective:     Patient ID: Charlotte Aguirre, female    DOB: [birth year], 78 y.o.   MRN: [id number]  Chief Complaint  Patient presents with   Abdominal Pain    Noticed lump in stomach last Wednesday, almost gone but still having soreness      HPI Charlotte Aguirre is a 78 year old female presenting for an acute visit on Mar 21, 2024, with concern about a lump on the perineum, which she suspects may be a lipoma. The patient states, "It is barely noticeable now." She reports the lump is non-tender and has not caused any discomfort. She denies redness, drainage, itching, or other associated symptoms. There is no history of trauma to the area.  Active Ambulatory Problems    Diagnosis Date Noted   Hyperlipidemia with target LDL less than 100 02/27/2009   Hereditary and idiopathic peripheral neuropathy 02/28/2009   Allergic rhinitis 02/27/2009   Diverticulosis of colon 02/27/2009   Irritable bowel syndrome 11/06/2009   Osteoarthritis 02/27/2009   Myalgia and myositis 02/27/2009   PEPTIC ULCER DISEASE, HX OF 02/27/2009   GERD (gastroesophageal reflux disease)    Essential hypertension    Memory impairment 12/12/2013   Trigeminal neuralgia syndrome 02/02/2014   Vitamin D  deficiency 02/02/2014   Prediabetes 06/23/2014   Osteopenia 09/13/2014   Rectocele 10/31/2014   Slow transit constipation 07/27/2015   Morbid obesity (HCC) 04/15/2016   Metabolic syndrome 04/15/2016   Hypokalemia 08/02/2019   Edema 04/17/2021   Epiretinal membrane, left eye 03/10/2022   Exudative age-related macular degeneration of left eye with active choroidal neovascularization (HCC) 03/10/2022   Intermediate stage nonexudative age-related macular degeneration of both eyes 03/10/2022   Retinal pigment epithelial detachment 03/10/2022   Pseudophakia of both eyes 04/14/2022   Vocal cord atrophy 12/02/2017   Aortic atherosclerosis (HCC) 01/20/2023   Pain of left hip joint 07/15/2023    Laryngopharyngeal reflux (LPR) 06/16/2023   Dysphonia 06/16/2023   Lipoma of groin 03/21/2024   Resolved Ambulatory Problems    Diagnosis Date Noted   Palpitations 02/27/2009   CHEST PAIN 02/27/2009   EPIGASTRIC PAIN 11/07/2009   ABDOMINAL PAIN, RIGHT LOWER QUADRANT, HX OF 11/06/2009   Arthritis    Annual physical exam 01/03/2014   Screening examination for infectious disease 02/02/2014   Need for 23-polyvalent pneumococcal polysaccharide vaccine 02/02/2014   Metabolic syndrome 06/23/2014   Obesity (BMI 30-39.9) 06/23/2014   Medication adverse effect 06/11/2015   Educated about COVID-19 virus infection 10/11/2019   Mass of right side of neck 12/02/2017   Acute bronchitis 05/12/2023   Cough with fever 05/12/2023   Past Medical History:  Diagnosis Date   Anxiety    Cataract    Fibromyalgia    Hyperlipidemia    Hypertension    IBS (irritable bowel syndrome)    Memory changes    Peripheral neuropathy    Vitamin D  insufficiency     Review of Systems  Constitutional:  Negative for chills and fever.  Respiratory:  Negative for cough and shortness of breath.   Cardiovascular:  Negative for chest pain and leg swelling.  Gastrointestinal:  Negative for diarrhea, nausea and vomiting.  Skin:  Negative for itching and rash.       Lipoma small on perineum  Neurological:  Negative for dizziness and headaches.   Negative unless indicated in HPI    Objective:    BP 118/74   Pulse 69   Temp (!) 97.2 F (36.2 C) (Temporal)  Ht 5\' 1"  (1.549 m)   Wt 190 lb 9.6 oz (86.5 kg)   SpO2 96%   BMI 36.01 kg/m  BP Readings from Last 3 Encounters:  03/21/24 118/74  03/17/24 134/80  01/05/24 119/66   Wt Readings from Last 3 Encounters:  03/21/24 190 lb 9.6 oz (86.5 kg)  03/17/24 185 lb (83.9 kg)  01/05/24 192 lb 6.4 oz (87.3 kg)      Physical Exam Vitals and nursing note reviewed.  Constitutional:      General: She is not in acute distress. HENT:     Head: Normocephalic and  atraumatic.     Nose: Nose normal.     Mouth/Throat:     Mouth: Mucous membranes are moist.  Eyes:     General: No scleral icterus.    Extraocular Movements: Extraocular movements intact.     Conjunctiva/sclera: Conjunctivae normal.     Pupils: Pupils are equal, round, and reactive to light.  Cardiovascular:     Heart sounds: Normal heart sounds.  Pulmonary:     Effort: Pulmonary effort is normal.     Breath sounds: Normal breath sounds.  Genitourinary:    Comments: 2x2 lipoma on perineum Musculoskeletal:        General: Normal range of motion.     Right lower leg: No edema.     Left lower leg: No edema.  Skin:    General: Skin is warm and dry.  Neurological:     Mental Status: She is alert and oriented to person, place, and time.  Psychiatric:        Mood and Affect: Mood normal.        Behavior: Behavior normal.        Thought Content: Thought content normal.        Judgment: Judgment normal.     No results found for any visits on 03/21/24.      Assessment & Plan:  Lipoma of groin  Charlotte Aguirre is a 78 year old Caucasian female seen today for: Lipoma, no acute distress Lipoma: monitor the area for any new or worsening symptoms such as rapid growth, pain, redness, or skin changes.  She is instructed to return for reevaluation if any of these symptoms occur  The above assessment and management plan was discussed with the patient. The patient verbalized understanding of and has agreed to the management plan. Patient is aware to call the clinic if they develop any new symptoms or if symptoms persist or worsen. Patient is aware when to return to the clinic for a follow-up visit. Patient educated on when it is appropriate to go to the emergency department.  Return if symptoms worsen or fail to improve.       Note: This document was prepared by [employer] voice dictation technology and any errors that results from this process are unintentional.

## 2024-04-08 ENCOUNTER — Other Ambulatory Visit: Payer: Self-pay | Admitting: Family

## 2024-04-08 DIAGNOSIS — K219 Gastro-esophageal reflux disease without esophagitis: Secondary | ICD-10-CM

## 2024-05-05 DIAGNOSIS — M7062 Trochanteric bursitis, left hip: Secondary | ICD-10-CM | POA: Diagnosis not present

## 2024-05-09 ENCOUNTER — Encounter: Payer: Self-pay | Admitting: Family

## 2024-05-09 ENCOUNTER — Ambulatory Visit (INDEPENDENT_AMBULATORY_CARE_PROVIDER_SITE_OTHER): Admitting: Family

## 2024-05-09 VITALS — BP 125/65 | HR 73 | Temp 97.0°F | Ht 61.0 in | Wt 188.4 lb

## 2024-05-09 DIAGNOSIS — E876 Hypokalemia: Secondary | ICD-10-CM | POA: Diagnosis not present

## 2024-05-09 DIAGNOSIS — K219 Gastro-esophageal reflux disease without esophagitis: Secondary | ICD-10-CM

## 2024-05-09 DIAGNOSIS — G609 Hereditary and idiopathic neuropathy, unspecified: Secondary | ICD-10-CM | POA: Diagnosis not present

## 2024-05-09 DIAGNOSIS — I10 Essential (primary) hypertension: Secondary | ICD-10-CM

## 2024-05-09 DIAGNOSIS — M160 Bilateral primary osteoarthritis of hip: Secondary | ICD-10-CM

## 2024-05-09 DIAGNOSIS — I700 Atherosclerosis of aorta: Secondary | ICD-10-CM

## 2024-05-09 DIAGNOSIS — R7303 Prediabetes: Secondary | ICD-10-CM

## 2024-05-09 DIAGNOSIS — K581 Irritable bowel syndrome with constipation: Secondary | ICD-10-CM | POA: Diagnosis not present

## 2024-05-09 DIAGNOSIS — H6993 Unspecified Eustachian tube disorder, bilateral: Secondary | ICD-10-CM | POA: Diagnosis not present

## 2024-05-09 DIAGNOSIS — K5901 Slow transit constipation: Secondary | ICD-10-CM | POA: Diagnosis not present

## 2024-05-09 DIAGNOSIS — E785 Hyperlipidemia, unspecified: Secondary | ICD-10-CM | POA: Diagnosis not present

## 2024-05-09 LAB — CMP14+EGFR
ALT: 9 IU/L (ref 0–32)
AST: 10 IU/L (ref 0–40)
Albumin: 4 g/dL (ref 3.8–4.8)
Alkaline Phosphatase: 126 IU/L — ABNORMAL HIGH (ref 44–121)
BUN/Creatinine Ratio: 21 (ref 12–28)
BUN: 18 mg/dL (ref 8–27)
Bilirubin Total: 0.3 mg/dL (ref 0.0–1.2)
CO2: 24 mmol/L (ref 20–29)
Calcium: 9.8 mg/dL (ref 8.7–10.3)
Chloride: 101 mmol/L (ref 96–106)
Creatinine, Ser: 0.86 mg/dL (ref 0.57–1.00)
Globulin, Total: 2.3 g/dL (ref 1.5–4.5)
Glucose: 90 mg/dL (ref 70–99)
Potassium: 4.2 mmol/L (ref 3.5–5.2)
Sodium: 142 mmol/L (ref 134–144)
Total Protein: 6.3 g/dL (ref 6.0–8.5)
eGFR: 69 mL/min/1.73 (ref >59)

## 2024-05-09 LAB — BAYER DCA HB A1C WAIVED: HB A1C (BAYER DCA - WAIVED): 5.5 % (ref 4.8–5.6)

## 2024-05-09 MED ORDER — PANTOPRAZOLE SODIUM 40 MG PO TBEC: 40.0000 mg | DELAYED_RELEASE_TABLET | Freq: Every day | ORAL | 0 refills | Status: AC

## 2024-05-09 MED ORDER — HYDROCHLOROTHIAZIDE 25 MG PO TABS: 25.0000 mg | ORAL_TABLET | Freq: Every day | ORAL | 1 refills | Status: AC

## 2024-05-09 MED ORDER — CETIRIZINE HCL 5 MG PO TABS
5.0000 mg | ORAL_TABLET | Freq: Every day | ORAL | 3 refills | Status: AC
Start: 2024-05-09 — End: 2025-05-04

## 2024-05-09 MED ORDER — SIMVASTATIN 40 MG PO TABS: 40.0000 mg | ORAL_TABLET | Freq: Every day | ORAL | 1 refills | Status: DC

## 2024-05-09 MED ORDER — POTASSIUM CHLORIDE ER 10 MEQ PO TBCR: 10.0000 meq | EXTENDED_RELEASE_TABLET | Freq: Every day | ORAL | 1 refills | Status: AC

## 2024-05-09 MED ORDER — GABAPENTIN 300 MG PO CAPS: ORAL_CAPSULE | ORAL | 5 refills | Status: DC

## 2024-05-09 NOTE — Progress Notes (Signed)
 Subjective:    Patient ID: Charlotte Aguirre, female    DOB: Sep 28, 1946, 78 y.o.   MRN: [id number]  Chief Complaint  Patient presents with   Medical Management of Chronic Issues   Pt presents to the office today for chronic follow up.   She is followed by GI for GERD, IBS, diverticulosis, and abdominal pain as needed. These are stable.    She is followed by Cardiologists for HTN and palpitations as needed.    She is morbid obese with a BMI of 35 and hypertension and hyperlipidemia.   Has aortic atherosclerosis and takes Zocor .   She is followed by Ortho for left hip pain. Diagnosed with bursitis and getting steroid injections.  Hypertension This is a chronic problem. The current episode started more than 1 year ago. The problem has been waxing and waning since onset. The problem is uncontrolled. Associated symptoms include malaise/fatigue. Pertinent negatives include no blurred vision, peripheral edema or shortness of breath. Risk factors for coronary artery disease include dyslipidemia, obesity and sedentary lifestyle. The current treatment provides moderate improvement.  Gastroesophageal Reflux She complains of belching and heartburn. This is a chronic problem. The current episode started more than 1 year ago. The problem occurs rarely. The symptoms are aggravated by certain foods. Risk factors include obesity. She has tried a PPI for the symptoms. The treatment provided moderate relief.  Arthritis Presents for follow-up visit. She complains of pain and stiffness. The symptoms have been stable. Affected locations include the left knee, right knee, right MCP, left MCP, left hip and left wrist (back). Her pain is at a severity of 4/10 (hip is 8-9 at times).  Hyperlipidemia This is a chronic problem. The current episode started more than 1 year ago. The problem is controlled. Recent lipid tests were reviewed and are normal. Exacerbating diseases include obesity. Pertinent negatives include no  shortness of breath. Current antihyperlipidemic treatment includes statins. The current treatment provides moderate improvement of lipids. Risk factors for coronary artery disease include dyslipidemia, hypertension, a sedentary lifestyle and post-menopausal.  Constipation This is a chronic problem. The current episode started more than 1 year ago. The problem has been resolved since onset. Her stool frequency is 2 to 3 times per week. Risk factors include obesity. She has tried laxatives (Linzess ) for the symptoms. The treatment provided mild relief.  Diabetes Diabetes type: prediabetic. Pertinent negatives for diabetes include no blurred vision and no foot paresthesias. Risk factors for coronary artery disease include dyslipidemia, hypertension, sedentary lifestyle and post-menopausal. (Does not check glucose at home)      Review of Systems  Constitutional:  Positive for malaise/fatigue.  Eyes:  Negative for blurred vision.  Respiratory:  Negative for shortness of breath.   Gastrointestinal:  Positive for constipation and heartburn.  Musculoskeletal:  Positive for stiffness.  All other systems reviewed and are negative.  Family History  Problem Relation Age of Onset   Migraines Mother    Heart failure Mother    Heart failure Father    Heart attack Father    Glaucoma Father    Heart failure Sister    Heart attack Sister    Diabetes Sister    Obesity Sister    COPD Sister        from 2nd hand smoke - never a smoker   Osteoporosis Sister    Colon polyps Sister    Migraines Brother    Dementia Brother    Heart attack Brother    Heart failure Brother  Heart attack Brother    Cancer Brother        LYMPHOMA   Heart attack Brother    Stomach cancer Brother        lymphoma   Cancer Brother        lymphoma-hip   Thyroid  disease Son    Throat cancer Son    Tongue cancer Son    Migraines Nephew    Colon cancer Neg Hx    Esophageal cancer Neg Hx    Rectal cancer Neg Hx     Breast cancer Neg Hx    Social History   Socioeconomic History   Marital status: Married    Spouse name: Nancyann   Number of children: 2   Years of education: 12   Highest education level: High school graduate  Occupational History   Occupation: Retired    Associate Professor: [employer]  Tobacco Use   Smoking status: Former    Current packs/day: 0.00    Average packs/day: 1.5 packs/day for 30.0 years (45.0 ttl pk-yrs)    Types: Cigarettes    Start date: 11/03/1958    Quit date: 11/03/1988    Years since quitting: 35.5   Smokeless tobacco: Never  Vaping Use   Vaping status: Never Used  Substance and Sexual Activity   Alcohol use: No   Drug use: No   Sexual activity: Yes    Partners: Male  Other Topics Concern   Not on file  Social History Narrative   Retired. Lives with husband Nancyann. 2 sons, do not live locally. One small dog. She enjoys crocheting and quilting. Is involved in her church.   Social Drivers of Corporate investment banker Strain: Low Risk  (06/25/2023)   Overall Financial Resource Strain (CARDIA)    Difficulty of Paying Living Expenses: Not hard at all  Food Insecurity: No Food Insecurity (06/25/2023)   Hunger Vital Sign    Worried About Running Out of Food in the Last Year: Never true    Ran Out of Food in the Last Year: Never true  Transportation Needs: No Transportation Needs (06/25/2023)   PRAPARE - Administrator, Civil Service (Medical): No    Lack of Transportation (Non-Medical): No  Physical Activity: Inactive (06/25/2023)   Exercise Vital Sign    Days of Exercise per Week: 0 days    Minutes of Exercise per Session: 0 min  Stress: No Stress Concern Present (06/25/2023)   [employer] of Occupational Health - Occupational Stress Questionnaire    Feeling of Stress : Not at all  Social Connections: Moderately Integrated (06/25/2023)   Social Connection and Isolation Panel    Frequency of Communication with Friends and Family: More than  three times a week    Frequency of Social Gatherings with Friends and Family: More than three times a week    Attends Religious Services: More than 4 times per year    Active Member of [employer] or Organizations: No    Attends Banker Meetings: Never    Marital Status: Married       Objective:   Physical Exam Vitals reviewed.  Constitutional:      General: She is not in acute distress.    Appearance: She is well-developed. She is obese.  HENT:     Head: Normocephalic and atraumatic.     Right Ear: Tympanic membrane normal.     Left Ear: Tympanic membrane normal.  Eyes:     Pupils: Pupils are equal, round,  and reactive to light.  Neck:     Thyroid : No thyromegaly.  Cardiovascular:     Rate and Rhythm: Normal rate and regular rhythm.     Heart sounds: Normal heart sounds. No murmur heard. Pulmonary:     Effort: Pulmonary effort is normal. No respiratory distress.     Breath sounds: Normal breath sounds. No wheezing.  Abdominal:     General: Bowel sounds are normal. There is no distension.     Palpations: Abdomen is soft.     Tenderness: There is no abdominal tenderness.  Musculoskeletal:        General: No tenderness. Normal range of motion.     Cervical back: Normal range of motion and neck supple.     Comments: Full ROM of left hip, pain with standing and walking  Skin:    General: Skin is warm and dry.  Neurological:     Mental Status: She is alert and oriented to person, place, and time.     Cranial Nerves: No cranial nerve deficit.     Deep Tendon Reflexes: Reflexes are normal and symmetric.  Psychiatric:        Behavior: Behavior normal.        Thought Content: Thought content normal.        Judgment: Judgment normal.          BP (!) 151/70   Pulse 64   Temp (!) 97 F (36.1 C) (Temporal)   Ht 5' 1 (1.549 m)   Wt 188 lb 6.4 oz (85.5 kg)   SpO2 98%   BMI 35.60 kg/m   Assessment & Plan:  Ronal DEL Hosang comes in today with chief complaint of  Medical Management of Chronic Issues   Diagnosis and orders addressed:  1. Eustachian tube dysfunction, bilateral - cetirizine  (ZYRTEC ) 5 MG tablet; Take 1 tablet (5 mg total) by mouth daily.  Dispense: 90 tablet; Refill: 3 - CMP14+EGFR  2. Hereditary and idiopathic peripheral neuropathy - gabapentin  (NEURONTIN ) 300 MG capsule; TAKE 1 CAPSULE BY MOUTH THREE TIMES A DAY  Dispense: 90 capsule; Refill: 5 - CMP14+EGFR  3. Essential hypertension - hydrochlorothiazide  (HYDRODIURIL ) 25 MG tablet; Take 1 tablet (25 mg total) by mouth daily.  Dispense: 90 tablet; Refill: 1 - CMP14+EGFR  4. Irritable bowel syndrome with constipation  - CMP14+EGFR  5. Gastroesophageal reflux disease without esophagitis - pantoprazole  (PROTONIX ) 40 MG tablet; Take 1 tablet (40 mg total) by mouth daily.  Dispense: 90 tablet; Refill: 0 - CMP14+EGFR  6. Hypokalemia - potassium chloride  (KLOR-CON ) 10 MEQ tablet; Take 1 tablet (10 mEq total) by mouth daily.  Dispense: 90 tablet; Refill: 1 - CMP14+EGFR  7. Hyperlipidemia with target LDL less than 100  - simvastatin  (ZOCOR ) 40 MG tablet; Take 1 tablet (40 mg total) by mouth daily.  Dispense: 90 tablet; Refill: 1 - CMP14+EGFR  8. Primary osteoarthritis of both hips (Primary) - CMP14+EGFR  9. Prediabetes - Bayer DCA Hb A1c Waived - CMP14+EGFR  10. Slow transit constipation  - CMP14+EGFR  11. Morbid obesity (HCC) - CMP14+EGFR  12. Aortic atherosclerosis (HCC)  - CMP14+EGFR   Labs pending Continue current medications  Health Maintenance reviewed Diet and exercise encouraged  Follow up plan: 6 months    Bari Learn, FNP

## 2024-05-09 NOTE — Patient Instructions (Signed)
 Health Maintenance After Age 79 After age 4, you are at a higher risk for certain long-term diseases and infections as well as injuries from falls. Falls are a major cause of broken bones and head injuries in people who are older than age 47. Getting regular preventive care can help to keep you healthy and well. Preventive care includes getting regular testing and making lifestyle changes as recommended by your health care provider. Talk with your health care provider about: Which screenings and tests you should have. A screening is a test that checks for a disease when you have no symptoms. A diet and exercise plan that is right for you. What should I know about screenings and tests to prevent falls? Screening and testing are the best ways to find a health problem early. Early diagnosis and treatment give you the best chance of managing medical conditions that are common after age 37. Certain conditions and lifestyle choices may make you more likely to have a fall. Your health care provider may recommend: Regular vision checks. Poor vision and conditions such as cataracts can make you more likely to have a fall. If you wear glasses, make sure to get your prescription updated if your vision changes. Medicine review. Work with your health care provider to regularly review all of the medicines you are taking, including over-the-counter medicines. Ask your health care provider about any side effects that may make you more likely to have a fall. Tell your health care provider if any medicines that you take make you feel dizzy or sleepy. Strength and balance checks. Your health care provider may recommend certain tests to check your strength and balance while standing, walking, or changing positions. Foot health exam. Foot pain and numbness, as well as not wearing proper footwear, can make you more likely to have a fall. Screenings, including: Osteoporosis screening. Osteoporosis is a condition that causes  the bones to get weaker and break more easily. Blood pressure screening. Blood pressure changes and medicines to control blood pressure can make you feel dizzy. Depression screening. You may be more likely to have a fall if you have a fear of falling, feel depressed, or feel unable to do activities that you used to do. Alcohol use screening. Using too much alcohol can affect your balance and may make you more likely to have a fall. Follow these instructions at home: Lifestyle Do not drink alcohol if: Your health care provider tells you not to drink. If you drink alcohol: Limit how much you have to: 0-1 drink a day for women. 0-2 drinks a day for men. Know how much alcohol is in your drink. In the [place] one drink equals one 12 oz bottle of beer (355 mL), one 5 oz glass of wine (148 mL), or one 1 oz glass of hard liquor (44 mL). Do not use any products that contain nicotine or tobacco. These products include cigarettes, chewing tobacco, and vaping devices, such as e-cigarettes. If you need help quitting, ask your health care provider. Activity  Follow a regular exercise program to stay fit. This will help you maintain your balance. Ask your health care provider what types of exercise are appropriate for you. If you need a cane or walker, use it as recommended by your health care provider. Wear supportive shoes that have nonskid soles. Safety  Remove any tripping hazards, such as rugs, cords, and clutter. Install safety equipment such as grab bars in bathrooms and safety rails on stairs. Keep rooms and walkways  well-lit. General instructions Talk with your health care provider about your risks for falling. Tell your health care provider if: You fall. Be sure to tell your health care provider about all falls, even ones that seem minor. You feel dizzy, tiredness (fatigue), or off-balance. Take over-the-counter and prescription medicines only as told by your health care provider. These include  supplements. Eat a healthy diet and maintain a healthy weight. A healthy diet includes low-fat dairy products, low-fat (lean) meats, and fiber from whole grains, beans, and lots of fruits and vegetables. Stay current with your vaccines. Schedule regular health, dental, and eye exams. Summary Having a healthy lifestyle and getting preventive care can help to protect your health and wellness after age 11. Screening and testing are the best way to find a health problem early and help you avoid having a fall. Early diagnosis and treatment give you the best chance for managing medical conditions that are more common for people who are older than age 28. Falls are a major cause of broken bones and head injuries in people who are older than age 48. Take precautions to prevent a fall at home. Work with your health care provider to learn what changes you can make to improve your health and wellness and to prevent falls. This information is not intended to replace advice given to you by your health care provider. Make sure you discuss any questions you have with your health care provider. Document Revised: 03/11/2021 Document Reviewed: 03/11/2021 Elsevier Patient Education  2024 [employer].

## 2024-05-10 ENCOUNTER — Ambulatory Visit: Payer: Self-pay | Admitting: Family

## 2024-05-12 ENCOUNTER — Other Ambulatory Visit: Payer: Self-pay | Admitting: Orthopedic Surgery

## 2024-05-12 DIAGNOSIS — M25552 Pain in left hip: Secondary | ICD-10-CM

## 2024-06-01 ENCOUNTER — Encounter: Payer: Self-pay | Admitting: Nurse Practitioner

## 2024-06-01 ENCOUNTER — Other Ambulatory Visit: Payer: Self-pay | Admitting: Nurse Practitioner

## 2024-06-01 ENCOUNTER — Ambulatory Visit (INDEPENDENT_AMBULATORY_CARE_PROVIDER_SITE_OTHER): Admitting: Nurse Practitioner

## 2024-06-01 ENCOUNTER — Ambulatory Visit

## 2024-06-01 VITALS — BP 135/74 | HR 71 | Temp 98.0°F | Ht 61.0 in | Wt 184.0 lb

## 2024-06-01 DIAGNOSIS — M79606 Pain in leg, unspecified: Secondary | ICD-10-CM | POA: Diagnosis not present

## 2024-06-01 DIAGNOSIS — M25562 Pain in left knee: Secondary | ICD-10-CM | POA: Diagnosis not present

## 2024-06-01 DIAGNOSIS — M7651 Patellar tendinitis, right knee: Secondary | ICD-10-CM | POA: Diagnosis not present

## 2024-06-01 DIAGNOSIS — M7652 Patellar tendinitis, left knee: Secondary | ICD-10-CM | POA: Diagnosis not present

## 2024-06-01 DIAGNOSIS — M25561 Pain in right knee: Secondary | ICD-10-CM

## 2024-06-01 NOTE — Progress Notes (Signed)
 Acute Office Visit  Subjective:     Patient ID: Charlotte Aguirre, female    DOB: 1946-07-26, 78 y.o.   MRN: [id number]  Chief Complaint  Patient presents with   lower left leg pain    HPI Charlotte Aguirre is a 78 year old female who presents on 06/01/2024 with concerns of bilateral lower leg pain. Symptoms initially began in the left leg several weeks ago and have recently shifted to include the right leg as well. She reports that the left leg remains more painful than the right. This morning, she experienced a sensation that her shin was about to give out. She denies trying any over-the-counter medications for relief. The patient is currently taking gabapentin  but reports no improvement in her symptoms with this medication. She denies fever, redness, swelling, trauma, recent travel, or history of blood clots.   Active Ambulatory Problems    Diagnosis Date Noted   Hyperlipidemia with target LDL less than 100 02/27/2009   Hereditary and idiopathic peripheral neuropathy 02/28/2009   Allergic rhinitis 02/27/2009   Diverticulosis of colon 02/27/2009   Irritable bowel syndrome 11/06/2009   Osteoarthritis 02/27/2009   Myalgia and myositis 02/27/2009   PEPTIC ULCER DISEASE, HX OF 02/27/2009   GERD (gastroesophageal reflux disease)    Essential hypertension    Memory impairment 12/12/2013   Trigeminal neuralgia syndrome 02/02/2014   Vitamin D  deficiency 02/02/2014   Prediabetes 06/23/2014   Osteopenia 09/13/2014   Rectocele 10/31/2014   Slow transit constipation 07/27/2015   Morbid obesity (HCC) 04/15/2016   Metabolic syndrome 04/15/2016   Hypokalemia 08/02/2019   Edema 04/17/2021   Epiretinal membrane, left eye 03/10/2022   Exudative age-related macular degeneration of left eye with active choroidal neovascularization (HCC) 03/10/2022   Intermediate stage nonexudative age-related macular degeneration of both eyes 03/10/2022   Retinal pigment epithelial detachment 03/10/2022   Pseudophakia  of both eyes 04/14/2022   Vocal cord atrophy 12/02/2017   Aortic atherosclerosis (HCC) 01/20/2023   Laryngopharyngeal reflux (LPR) 06/16/2023   Dysphonia 06/16/2023   Lipoma of groin 03/21/2024   Resolved Ambulatory Problems    Diagnosis Date Noted   Palpitations 02/27/2009   CHEST PAIN 02/27/2009   EPIGASTRIC PAIN 11/07/2009   ABDOMINAL PAIN, RIGHT LOWER QUADRANT, HX OF 11/06/2009   Arthritis    Annual physical exam 01/03/2014   Screening examination for infectious disease 02/02/2014   Need for 23-polyvalent pneumococcal polysaccharide vaccine 02/02/2014   Metabolic syndrome 06/23/2014   Obesity (BMI 30-39.9) 06/23/2014   Medication adverse effect 06/11/2015   Educated about COVID-19 virus infection 10/11/2019   Mass of right side of neck 12/02/2017   Acute bronchitis 05/12/2023   Cough with fever 05/12/2023   Pain of left hip joint 07/15/2023   Past Medical History:  Diagnosis Date   Anxiety    Cataract    Fibromyalgia    Hyperlipidemia    Hypertension    IBS (irritable bowel syndrome)    Memory changes    Peripheral neuropathy    Vitamin D  insufficiency     Review of Systems  Constitutional:  Negative for chills and fever.  HENT: Negative.  Negative for congestion and sore throat.   Cardiovascular:  Negative for chest pain and leg swelling.  Gastrointestinal:  Negative for blood in stool, diarrhea, nausea and vomiting.  Musculoskeletal:  Negative for falls.       Bilateral leg pain  Skin:  Negative for rash.  Neurological:  Negative for dizziness and headaches.   Negative unless indicated  in HPI    Objective:    BP 135/74   Pulse 71   Temp 98 F (36.7 C)   Ht 5' 1 (1.549 m)   Wt 184 lb (83.5 kg)   SpO2 95%   BMI 34.77 kg/m  BP Readings from Last 3 Encounters:  06/01/24 135/74  05/09/24 125/65  03/21/24 118/74   Wt Readings from Last 3 Encounters:  06/01/24 184 lb (83.5 kg)  05/09/24 188 lb 6.4 oz (85.5 kg)  03/21/24 190 lb 9.6 oz (86.5 kg)       Physical Exam Vitals and nursing note reviewed.  Constitutional:      General: She is not in acute distress. HENT:     Head: Normocephalic and atraumatic.     Nose: Nose normal.     Mouth/Throat:     Mouth: Mucous membranes are moist.  Eyes:     Extraocular Movements: Extraocular movements intact.     Conjunctiva/sclera: Conjunctivae normal.     Pupils: Pupils are equal, round, and reactive to light.  Cardiovascular:     Heart sounds: Normal heart sounds.  Pulmonary:     Effort: Pulmonary effort is normal.     Breath sounds: Normal breath sounds.  Musculoskeletal:        General: Normal range of motion.     Right lower leg: Normal. No edema.     Left lower leg: Tenderness present. No edema.       Legs:  Skin:    General: Skin is warm and dry.     Findings: No rash.  Neurological:     Mental Status: She is alert and oriented to person, place, and time.  Psychiatric:        Mood and Affect: Mood normal.        Behavior: Behavior normal.        Thought Content: Thought content normal.        Judgment: Judgment normal.    Pertinent labs & imaging results that were available during my care of the patient were reviewed by me and considered in my medical decision making.  No results found for any visits on 06/01/24.      Assessment & Plan:  Leg pain, diffuse, unspecified laterality -     DG Knee Bilateral Standing AP; Future   Charlotte Aguirre is a 78 year old Caucasian female seen today for bilateral leg pain, no acute distress X-ray ordered awaiting final report from radiologist Continue gabapentin  as prescribed, rest, elevate the leg when sitting, heat pads 15-minute increments as tolerated Continue healthy lifestyle choices, including diet (rich in fruits, vegetables,     The above assessment and management plan was discussed with the patient. The patient verbalized understanding of and has agreed to the management plan. Patient is aware to call the clinic if they develop any  new symptoms or if symptoms persist or worsen. Patient is aware when to return to the clinic for a follow-up visit. Patient educated on when it is appropriate to go to the emergency department.  Return if symptoms worsen or fail to improve.  Wagner Tanzi [place] Thompson, DNP [facility] [address] [place], [place] [address] [phone]  Note: This document was prepared by [employer] voice dictation technology and any errors that results from this process are unintentional.

## 2024-06-03 ENCOUNTER — Telehealth: Payer: Self-pay | Admitting: Family Medicine

## 2024-06-03 NOTE — Telephone Encounter (Signed)
 Copied from CRM [id number]. Topic: Clinical - Lab/Test Results >> Jun 03, 2024  4:31 PM Wess RAMAN wrote: Reason for CRM: Patient would like her imaging results   Callback #: [phone]

## 2024-06-04 ENCOUNTER — Other Ambulatory Visit

## 2024-06-06 ENCOUNTER — Ambulatory Visit: Payer: Self-pay | Admitting: Nurse Practitioner

## 2024-06-06 NOTE — Telephone Encounter (Signed)
 Called [facility] to get read on imaging - should have report by the end of the day

## 2024-07-05 DIAGNOSIS — H35372 Puckering of macula, left eye: Secondary | ICD-10-CM | POA: Diagnosis not present

## 2024-07-05 DIAGNOSIS — Z961 Presence of intraocular lens: Secondary | ICD-10-CM | POA: Diagnosis not present

## 2024-07-05 DIAGNOSIS — H26491 Other secondary cataract, right eye: Secondary | ICD-10-CM | POA: Diagnosis not present

## 2024-07-05 DIAGNOSIS — H43813 Vitreous degeneration, bilateral: Secondary | ICD-10-CM | POA: Diagnosis not present

## 2024-07-05 DIAGNOSIS — H524 Presbyopia: Secondary | ICD-10-CM | POA: Diagnosis not present

## 2024-07-05 DIAGNOSIS — H5203 Hypermetropia, bilateral: Secondary | ICD-10-CM | POA: Diagnosis not present

## 2024-07-05 DIAGNOSIS — H52223 Regular astigmatism, bilateral: Secondary | ICD-10-CM | POA: Diagnosis not present

## 2024-07-05 DIAGNOSIS — H353111 Nonexudative age-related macular degeneration, right eye, early dry stage: Secondary | ICD-10-CM | POA: Diagnosis not present

## 2024-07-05 DIAGNOSIS — H353122 Nonexudative age-related macular degeneration, left eye, intermediate dry stage: Secondary | ICD-10-CM | POA: Diagnosis not present

## 2024-07-21 ENCOUNTER — Ambulatory Visit: Payer: Self-pay

## 2024-07-21 NOTE — Telephone Encounter (Signed)
 FYI Only or Action Required?: FYI only for provider.  Patient was last seen in primary care on 06/01/2024 by Charlotte Morton Sebastian Nena, NP.  Called Nurse Triage reporting Hip Pain.  Symptoms began about a month ago.  Interventions attempted: OTC medications: Aleve .  Symptoms are: gradually worsening.  Triage Disposition: See PCP When Office is Open (Within 3 Days)  Patient/caregiver understands and will follow disposition?: Yes    Copied from CRM [id number]. Topic: Clinical - Red Word Triage >> Jul 21, 2024  4:23 PM Charlotte Aguirre wrote: Red Word that prompted transfer to Nurse Triage: Patient reports she is having issue with left hip pain, due to bursitis, has had back and hip injections and has had reactions to both.   Wants an appointment as soon as possible to discus further options and treatment. Reason for Disposition  [1] MODERATE pain (e.g., interferes with normal activities, limping) AND [2] present > 3 days  Answer Assessment - Initial Assessment Questions Patient reports having an injection in the hip by ortho and having a reaction to it. She says she doesn'Aguirre want that anymore. She says therapy didn'Aguirre help either. She asked if possibly prednisone  will help. This pain has progressively gotten worse over the past month, using a cane to walk and that helps relieve the pressure, taking aleve  BID. She asked to see a specific provider, scheduled with Dr. Zollie first available on 07/27/24.    1. LOCATION and RADIATION: Where is the pain located? Does the pain spread (shoot) anywhere else?     Left hip down to the knee when weight bearing  2. QUALITY: What does the pain feel like?  (e.g., sharp, dull, aching, burning)     Bad ache to the hip when put weight on it. Feels like the sciatica pain (sharp in the leg closer to knee)  3. SEVERITY: How bad is the pain? What does it keep you from doing?   (Scale 1-10; or mild, moderate, severe)     8-9 when weight is put on it; no pain  at rest  4. ONSET: When did the pain start? Does it come and go, or is it there all the time?     1 month ago and has progressed  5. WORK OR EXERCISE: Has there been any recent work or exercise that involved this part of the body?      Nothing  6. CAUSE: What do you think is causing the hip pain?      Bursitis diagnosis last year  7. AGGRAVATING FACTORS: What makes the hip pain worse? (e.g., walking, climbing stairs, running)     Putting weight on it, using a cane for assistance  8. OTHER SYMPTOMS: Do you have any other symptoms? (e.g., back pain, pain shooting down leg,  fever, rash)     No  Protocols used: Hip Pain-A-AH

## 2024-07-22 NOTE — Telephone Encounter (Signed)
 Appt made.

## 2024-07-27 ENCOUNTER — Ambulatory Visit (INDEPENDENT_AMBULATORY_CARE_PROVIDER_SITE_OTHER): Admitting: Family Medicine

## 2024-07-27 ENCOUNTER — Encounter: Payer: Self-pay | Admitting: Family Medicine

## 2024-07-27 VITALS — BP 122/69 | HR 79 | Temp 98.0°F | Ht 61.0 in | Wt 190.0 lb

## 2024-07-27 DIAGNOSIS — M7062 Trochanteric bursitis, left hip: Secondary | ICD-10-CM | POA: Diagnosis not present

## 2024-07-27 DIAGNOSIS — Z23 Encounter for immunization: Secondary | ICD-10-CM

## 2024-07-27 MED ORDER — PREDNISONE 20 MG PO TABS: ORAL_TABLET | ORAL | 0 refills | Status: DC

## 2024-07-27 NOTE — Progress Notes (Unsigned)
 Subjective:  Patient ID: Ronal VEAR Redo, female    DOB: 1946/05/17  Age: 78 y.o. MRN: [id number]  CC: chronic left hip pain   HPI  Discussed the use of AI scribe software for clinical note transcription with the patient, who gave verbal consent to proceed.  History of Present Illness JAXIE RACANELLI is a 78 year old female who presents with left hip pain and bursitis.  She has been experiencing left hip pain since August 2024. The pain is located in the hip and bursa area, with additional soreness when weight is placed on the leg. The pain sometimes radiates, causing the whole leg to ache like a 'toothache'. She notes difficulty sleeping on her right side due to pain when lifting her left leg, but sleeping on her left side does not cause discomfort.  She has undergone two rounds of physical therapy, two MRIs, and several x-rays, leading to a diagnosis of bursitis. She received an injection in her back, which resulted in a severe headache lasting almost a week, and another injection in her hip that caused excruciating pain, described as a 'flare'. Due to these experiences, she is hesitant to pursue further injections.  She has a history of sciatica on the opposite side and wonders if it could be related to her current symptoms. She believes prednisone  might help, as it has been effective for her sciatica in the past.  She experiences palpitations at night, for which she has been prescribed propranolol . She has been managing her symptoms for over a year, which has significantly impacted her daily activities, including driving and grocery shopping. She is the primary driver in her family due to her husband's health issues.          07/27/2024    2:05 PM 06/01/2024   10:43 AM 05/09/2024   10:15 AM  Depression screen PHQ 2/9  Decreased Interest 0 0   Down, Depressed, Hopeless 0 0   PHQ - 2 Score 0 0   Altered sleeping  0 0  Tired, decreased energy  0 0  Change in appetite  0   Feeling bad or  failure about yourself   0 0  Trouble concentrating  0 0  Moving slowly or fidgety/restless  0 0  Suicidal thoughts  0 0  PHQ-9 Score  0   Difficult doing work/chores  Not difficult at all Not difficult at all    History Emmakate has a past medical history of Anxiety, Arthritis, Cataract, Fibromyalgia, GERD (gastroesophageal reflux disease), Hyperlipidemia, Hypertension, IBS (irritable bowel syndrome), Memory changes, Osteopenia, Peripheral neuropathy, and Vitamin D  insufficiency.   She has a past surgical history that includes Cholecystectomy; Colonoscopy (N/A, 03/22/2014); Abdominal hysterectomy; Oophorectomy; Cataract extraction (Left, 01/23/2022); Wisdom tooth extraction; Appendectomy; Bladder surgery; Toe Surgery (Left); Breast excisional biopsy; Cataract extraction (Right); and Cholecystectomy.   Her family history includes COPD in her sister; Cancer in her brother and brother; Colon polyps in her sister; Dementia in her brother; Diabetes in her sister; Glaucoma in her father; Heart attack in her brother, brother, brother, father, and sister; Heart failure in her brother, father, mother, and sister; Migraines in her brother, mother, and nephew; Obesity in her sister; Osteoporosis in her sister; Stomach cancer in her brother; Throat cancer in her son; Thyroid  disease in her son; Tongue cancer in her son.She reports that she quit smoking about 35 years ago. Her smoking use included cigarettes. She started smoking about 65 years ago. She has a 45 pack-year smoking history.  She has never used smokeless tobacco. She reports that she does not drink alcohol and does not use drugs.    ROS Review of Systems  Constitutional: Negative.   HENT: Negative.    Eyes:  Negative for visual disturbance.  Respiratory:  Negative for shortness of breath.   Cardiovascular:  Negative for chest pain.  Gastrointestinal:  Negative for abdominal pain.  Musculoskeletal:  Negative for arthralgias.    Objective:  BP  122/69   Pulse 79   Temp 98 F (36.7 C)   Ht 5' 1 (1.549 m)   Wt 190 lb (86.2 kg)   SpO2 95%   BMI 35.90 kg/m   BP Readings from Last 3 Encounters:  07/27/24 122/69  06/01/24 135/74  05/09/24 125/65    Wt Readings from Last 3 Encounters:  07/27/24 190 lb (86.2 kg)  06/01/24 184 lb (83.5 kg)  05/09/24 188 lb 6.4 oz (85.5 kg)     Physical Exam Physical Exam GENERAL: Alert, cooperative, well developed, no acute distress. HEENT: Normocephalic, normal oropharynx, moist mucous membranes. CHEST: Clear to auscultation bilaterally, no wheezes, rhonchi, or crackles. CARDIOVASCULAR: Normal heart rate and rhythm, S1 and S2 normal without murmurs. ABDOMEN: Soft, non-tender, non-distended, without organomegaly, normal bowel sounds. EXTREMITIES: No cyanosis or edema. MUSCULOSKELETAL: Left hip pain on weight bearing, no pain on flexion or abduction. NEUROLOGICAL: Cranial nerves grossly intact, moves all extremities without gross motor or sensory deficit.    Assessment & Plan:  Trochanteric bursitis of left hip  Encounter for immunization -     Flu vaccine HIGH DOSE PF(Fluzone Trivalent)  Other orders -     predniSONE ; One twice daily with food for 2 weeks. Then one daily for 2 weeks  Dispense: 42 tablet; Refill: 0    Assessment and Plan Assessment & Plan Left hip trochanteric bursitis   Chronic left hip trochanteric bursitis since August 2024 presents with pain during weight-bearing and soreness. Previous treatments included physical therapy, MRIs, x-rays, and corticosteroid injections, which led to severe headache and pain flare. Differential diagnosis considers possible nerve involvement or sciatica, given her sciatica on the opposite side. Prescribe prednisone  20 mg, take two tablets daily for two weeks, then one tablet daily for two more weeks. Advise follow-up with primary care provider in one month to reassess symptoms and determine next steps.  Palpitations   Intermittent  palpitations occur primarily at night, previously evaluated and managed with propranolol .       Follow-up: Return if symptoms worsen or fail to improve.  Butler Der, M.D.

## 2024-08-15 ENCOUNTER — Encounter: Payer: Self-pay | Admitting: Family Medicine

## 2024-08-15 ENCOUNTER — Ambulatory Visit (INDEPENDENT_AMBULATORY_CARE_PROVIDER_SITE_OTHER): Admitting: Family Medicine

## 2024-08-15 ENCOUNTER — Ambulatory Visit: Payer: Self-pay | Admitting: Family Medicine

## 2024-08-15 ENCOUNTER — Ambulatory Visit: Payer: Self-pay

## 2024-08-15 VITALS — BP 123/67 | HR 72 | Temp 98.0°F | Ht 61.0 in | Wt 191.2 lb

## 2024-08-15 DIAGNOSIS — R519 Headache, unspecified: Secondary | ICD-10-CM

## 2024-08-15 DIAGNOSIS — B370 Candidal stomatitis: Secondary | ICD-10-CM

## 2024-08-15 DIAGNOSIS — M7062 Trochanteric bursitis, left hip: Secondary | ICD-10-CM

## 2024-08-15 DIAGNOSIS — N9489 Other specified conditions associated with female genital organs and menstrual cycle: Secondary | ICD-10-CM

## 2024-08-15 DIAGNOSIS — B3731 Acute candidiasis of vulva and vagina: Secondary | ICD-10-CM

## 2024-08-15 LAB — MICROSCOPIC EXAMINATION
RBC, Urine: NONE SEEN /HPF (ref 0–2)
Renal Epithel, UA: NONE SEEN /HPF
WBC, UA: NONE SEEN /HPF (ref 0–5)

## 2024-08-15 LAB — WET PREP FOR TRICH, YEAST, CLUE
Clue Cell Exam: NEGATIVE
Trichomonas Exam: NEGATIVE
Yeast Exam: POSITIVE — AB

## 2024-08-15 LAB — URINALYSIS, ROUTINE W REFLEX MICROSCOPIC
Bilirubin, UA: NEGATIVE
Glucose, UA: NEGATIVE
Nitrite, UA: NEGATIVE
Protein,UA: NEGATIVE
RBC, UA: NEGATIVE
Specific Gravity, UA: 1.025 (ref 1.005–1.030)
Urobilinogen, Ur: 0.2 mg/dL (ref 0.2–1.0)
pH, UA: 5.5 (ref 5.0–7.5)

## 2024-08-15 MED ORDER — MELOXICAM 7.5 MG PO TABS: 7.5000 mg | ORAL_TABLET | Freq: Every day | ORAL | 1 refills | Status: DC

## 2024-08-15 MED ORDER — FLUCONAZOLE 100 MG PO TABS: ORAL_TABLET | ORAL | 0 refills | Status: DC

## 2024-08-15 NOTE — Patient Instructions (Addendum)
 Take prednisone  1/2 pill only daily for three days then DC. The next day start meloxicam. Take one daily with food.

## 2024-08-15 NOTE — Telephone Encounter (Signed)
 Appt made.

## 2024-08-15 NOTE — Progress Notes (Signed)
 Subjective:  Patient ID: Charlotte Aguirre, female    DOB: [birth year]  Age: 78 y.o. MRN: [id number]  CC: Headache (Right side of head)   HPI  Discussed the use of AI scribe software for clinical note transcription with the patient, who gave verbal consent to proceed.  History of Present Illness Charlotte Aguirre is a 78 year old female who presents with sharp head pains and blurred vision.  She experiences sharp pains in her head that began on Friday morning around 3:00 AM. The pain is described as shooting from the top of her head on the right side, down into her right cheek and jaw area, but not into her neck. The initial episode involved three sharp pains within ten minutes, waking her from sleep. She experienced additional episodes later that day, several times on Saturday, and again on "Sunday. On the morning of the visit, she had one sharp pain followed by a dull ache above her ear. These are not typical headaches, as she has a history of headaches and describes these as different.  She reports blurred vision, which she attributes to double vision following cataract surgery on her right eye last year. She wears glasses with prisms, but they do not fully correct the double vision, making driving difficult. She recently had new glasses made, but they did not improve her vision.  She has been experiencing frequent hot flashes, which have increased in frequency since starting prednisone three weeks ago. Previously, she would have hot flashes every four to five months, but now they occur daily.  She mentions palpitations when lying on her left side, describing a sensation of her heart quivering and sometimes feeling the need to cough to reset her heartbeat. These palpitations are more noticeable at night when lying down.  She is currently taking prednisone, which was prescribed three weeks ago for hip pain. She reports improvement in her hip pain, stating it is 'a lot better,' but she still needs to take  short steps and sometimes cannot put weight on it. She uses a cane to alleviate pressure when walking.          10" /13/2025    1:34 PM 07/27/2024    2:05 PM 06/01/2024   10:43 AM  Depression screen PHQ 2/9  Decreased Interest 0 0 0  Down, Depressed, Hopeless 0 0 0  PHQ - 2 Score 0 0 0  Altered sleeping   0  Tired, decreased energy   0  Change in appetite   0  Feeling bad or failure about yourself    0  Trouble concentrating   0  Moving slowly or fidgety/restless   0  Suicidal thoughts   0  PHQ-9 Score   0  Difficult doing work/chores   Not difficult at all    History Charlotte Aguirre has a past medical history of Anxiety, Arthritis, Cataract, Fibromyalgia, GERD (gastroesophageal reflux disease), Hyperlipidemia, Hypertension, IBS (irritable bowel syndrome), Memory changes, Osteopenia, Peripheral neuropathy, and Vitamin D  insufficiency.   She has a past surgical history that includes Cholecystectomy; Colonoscopy (N/A, 03/22/2014); Abdominal hysterectomy; Oophorectomy; Cataract extraction (Left, 01/23/2022); Wisdom tooth extraction; Appendectomy; Bladder surgery; Toe Surgery (Left); Breast excisional biopsy; Cataract extraction (Right); and Cholecystectomy.   Her family history includes COPD in her sister; Cancer in her brother and brother; Colon polyps in her sister; Dementia in her brother; Diabetes in her sister; Glaucoma in her father; Heart attack in her brother, brother, brother, father, and sister; Heart failure in her brother, father,  mother, and sister; Migraines in her brother, mother, and nephew; Obesity in her sister; Osteoporosis in her sister; Stomach cancer in her brother; Throat cancer in her son; Thyroid  disease in her son; Tongue cancer in her son.She reports that she quit smoking about 35 years ago. Her smoking use included cigarettes. She started smoking about 65 years ago. She has a 45 pack-year smoking history. She has never used smokeless tobacco. She reports that she does not  drink alcohol and does not use drugs.    ROS Review of Systems  Constitutional: Negative.   HENT:  Negative for congestion.   Eyes:  Negative for visual disturbance.  Respiratory:  Negative for shortness of breath.   Cardiovascular:  Positive for palpitations. Negative for chest pain.  Gastrointestinal:  Negative for abdominal pain, constipation, diarrhea, nausea and vomiting.  Endocrine: Positive for heat intolerance (increasing hot flashes).  Genitourinary:  Negative for difficulty urinating.  Musculoskeletal:  Negative for arthralgias and myalgias.  Neurological:  Negative for headaches.  Psychiatric/Behavioral:  Negative for behavioral problems and sleep disturbance.     Objective:  BP 123/67   Pulse 72   Temp 98 F (36.7 C)   Ht 5' 1 (1.549 m)   Wt 191 lb 3.2 oz (86.7 kg)   SpO2 95%   BMI 36.13 kg/m   BP Readings from Last 3 Encounters:  08/15/24 123/67  07/27/24 122/69  06/01/24 135/74    Wt Readings from Last 3 Encounters:  08/15/24 191 lb 3.2 oz (86.7 kg)  07/27/24 190 lb (86.2 kg)  06/01/24 184 lb (83.5 kg)     Physical Exam Physical Exam GENERAL: Alert, cooperative, well developed, no acute distress. HEENT: Normocephalic, normal oropharynx, moist mucous membranes, oral cavity without abnormalities. CHEST: Clear to auscultation bilaterally, no wheezes, rhonchi, or crackles. CARDIOVASCULAR: Normal heart rate and rhythm, S1 and S2 normal without murmurs. ABDOMEN: Soft, non-tender, non-distended, without organomegaly, normal bowel sounds. EXTREMITIES: No cyanosis or edema. NEUROLOGICAL: Cranial nerves grossly intact, extraocular movements intact, facial muscles symmetric, muscles of mastication normal, moves all extremities without gross motor or sensory deficit.   Assessment & Plan:  Trochanteric bursitis of left hip -     Meloxicam; Take 1 tablet (7.5 mg total) by mouth daily. For joint and muscle pain  Dispense: 90 tablet; Refill: 1  Vaginal  burning -     WET PREP FOR TRICH, YEAST, CLUE -     Urinalysis, Routine w reflex microscopic -     Urine Culture -     Microscopic Examination  Candidiasis of mouth -     Fluconazole ; Take two with first dose. Then starting the next day take one daily until all are taken.  Dispense: 15 tablet; Refill: 0  Vagina, candidiasis -     Fluconazole ; Take two with first dose. Then starting the next day take one daily until all are taken.  Dispense: 15 tablet; Refill: 0  Right sided facial pain    Assessment and Plan Assessment & Plan Left trochanteric bursitis   Improvement noted with prednisone , but she experiences short steps and difficulty with weight-bearing activities. Prednisone  may be causing hot flashes. Taper prednisone  by taking half a pill daily for three days, then stop. Prescribe meloxicam, one pill daily, as an alternative. Monitor kidney function at the next check-in with Miss Lavell.  Right-sided facial and head pain   She experiences intermittent sharp pains on the right side of the head, extending from the top of the head to the cheek  and jaw, without associated weakness, facial muscle issues, or speech problems. Vision is blurred, but there are no signs of internal brain issues. Symptoms may be related to prednisone  use. Monitor symptoms after stopping prednisone  and advise seeking emergency care if symptoms worsen or new symptoms develop.  Menopausal symptoms (hot flashes)   Increased frequency of hot flashes, possibly related to prednisone  use, with symptoms more frequent since starting prednisone . Taper off prednisone  as planned for left trochanteric bursitis and monitor for improvement in hot flashes after stopping prednisone .  Oral and vaginal candidiasis   Yeast infection present in both oral and vaginal areas, with no bacterial infection noted in urine or vaginal specimens. Prescribe fluconazole , taking two pills today, then one pill daily for two weeks.        Follow-up: Return if symptoms worsen or fail to improve.  Butler Der, M.D.

## 2024-08-15 NOTE — Telephone Encounter (Signed)
 FYI Only or Action Required?: FYI only for provider.  Patient was last seen in primary care on 07/27/2024 by Zollie Lowers, MD.  Called Nurse Triage reporting Headache.  Symptoms began several days ago.  Interventions attempted: OTC medications: Aleve .  Symptoms are: stable.  Triage Disposition: See Physician Within 24 Hours  Patient/caregiver understands and will follow disposition?: Yes  **Appt. Scheduled for 10/13**      Copied from CRM #[id number]. Topic: Clinical - Red Word Triage >> Aug 15, 2024  8:57 AM Graeme ORN wrote: Red Word that prompted transfer to Nurse Triage: Terrible pains head - shooting down - not a headache - vision is worse.    ----------------------------------------------------------------------- From previous Reason for Contact - Scheduling: Patient/patient representative is calling to schedule an appointment. Refer to attachments for appointment information. Reason for Disposition  [1] MODERATE headache (e.g., interferes with normal activities) AND [2] present > 24 hours AND [3] unexplained  (Exceptions: Pain medicines not tried, typical migraine, or headache part of viral illness.)  Answer Assessment - Initial Assessment Questions 1. LOCATION: Where does it hurt?      Right, top of head, radiates down  2. ONSET: When did the headache start? (e.g., minutes, hours, days)       Last Saturday morning   3. PATTERN: Does the pain come and go, or has it been constant since it started?     Intermittent   4. SEVERITY: How bad is the pain? and What does it keep you from doing?  (e.g., Scale 1-10; mild, moderate, or severe)     No pain, currently   5. RECURRENT SYMPTOM: Have you ever had headaches before? If Yes, ask: When was the last time? and What happened that time?      No   6. CAUSE: What do you think is causing the headache?     Unsure  7. MIGRAINE: Have you been diagnosed with migraine headaches? If Yes, ask: Is this  headache similar?      Hx. Of headaches   8. HEAD INJURY: Has there been any recent injury to your head?      No   9. OTHER SYMPTOMS: Do you have any other symptoms? (e.g., fever, stiff neck, eye pain, sore throat, cold symptoms)   Causing vision disturbances, has hx. Of cataracts. She is having blurry vision.   She says its not like a headache, the pain is sharp. No other acute symptoms noted. Appt. Scheduled for 10/13  Protocols used: [facility]

## 2024-08-17 LAB — URINE CULTURE

## 2024-08-26 ENCOUNTER — Ambulatory Visit: Payer: Self-pay | Admitting: *Deleted

## 2024-08-26 NOTE — Telephone Encounter (Signed)
 Please advise if alternative medication can be prescribed or additional medication , diflucan  not helping sx.    FYI Only or Action Required?: Action required by provider: medication refill request, update on patient condition, and requesting additional medication or different medication , almost finished diflucan  and sx not getting better.  Patient was last seen in primary care on 08/15/2024 by Charlotte Lowers, MD.  Called Nurse Triage reporting No chief complaint on file..  Symptoms began a week ago.  Interventions attempted: Prescription medications: diflucan  .  Symptoms are: unchanged.  Triage Disposition: See PCP When Office is Open (Within 3 Days) if another OV needed.  Patient/caregiver understands and will follow disposition?: No, wishes to speak with PCP               Copied from CRM [id number]. Topic: Clinical - Red Word Triage >> Aug 26, 2024  2:05 PM Charlotte Aguirre wrote: Red Word that prompted transfer to Nurse Triage: Patient was prescribed fluconazole  (DIFLUCAN ) and still has a yeast infection, she is requesting a different medication. She says it burns and itching. Reason for Disposition  [1] MILD-MODERATE mouth pain AND [2] present > 3 days  Answer Assessment - Initial Assessment Questions Medication diflucan  not effective for candidiasis mouth/ vagina. Patient has 1 tablet left and reports sx of burning in mouth not getting better. Less burning to no burning vaginal area. Please advise.        1. ONSET: When did your mouth start hurting? (e.g., hours or days ago)      On going since last OV 08/15/24 2. SEVERITY: How bad is the pain? (Scale 1-10; or mild, moderate or severe)     Continues to burn inside lips of mouth and less burning vaginal area 3. SORES: Are there any sores or ulcers in the mouth? If Yes, ask: What part of the mouth are the sores in?     Inside lips  4. FEVER: Do you have a fever? If Yes, ask: What is your temperature, how was  it measured, and when did it start?     na 5. CAUSE: What do you think is causing the mouth pain?     Possible yeast  continues in mouth and vaginal area after taking antibiotics. Recent dx of candidiasis mouth and vagina. Reports diflucan  not working and requesting if something else will help.  6. OTHER SYMPTOMS: Do you have any other symptoms? (e.g., difficulty breathing)     Burning in mouth and denies burning with urination  Protocols used: Mouth Pain-A-[facility]

## 2024-08-29 ENCOUNTER — Ambulatory Visit

## 2024-08-29 VITALS — BP 123/67 | HR 72 | Ht 61.0 in | Wt 191.0 lb

## 2024-08-29 DIAGNOSIS — Z0000 Encounter for general adult medical examination without abnormal findings: Secondary | ICD-10-CM

## 2024-08-29 NOTE — Patient Instructions (Signed)
 [name],  Thank you for taking the time for your Medicare Wellness Visit. I appreciate your continued commitment to your health goals. Please review the care plan we discussed, and feel free to reach out if I can assist you further.  Medicare recommends these wellness visits once per year to help you and your care team stay ahead of potential health issues. These visits are designed to focus on prevention, allowing your provider to concentrate on managing your acute and chronic conditions during your regular appointments.  Please note that Annual Wellness Visits do not include a physical exam. Some assessments may be limited, especially if the visit was conducted virtually. If needed, we may recommend a separate in-person follow-up with your provider.  Ongoing Care Seeing your primary care provider every 3 to 6 months helps us  monitor your health and provide consistent, personalized care.   Referrals If a referral was made during today's visit and you haven't received any updates within two weeks, please contact the referred provider directly to check on the status.  Recommended Screenings:  Health Maintenance  Topic Date Due   Medicare Annual Wellness Visit  06/24/2024   COVID-19 Vaccine (5 - 2025-26 season) 07/04/2024   DEXA scan (bone density measurement)  09/16/2024   Breast Cancer Screening  01/26/2025   DTaP/Tdap/Td vaccine (2 - Td or Tdap) 05/31/2030   Pneumococcal Vaccine for age over 38  Completed   Flu Shot  Completed   Hepatitis C Screening  Completed   Zoster (Shingles) Vaccine  Completed   Meningitis B Vaccine  Aged Out   Colon Cancer Screening  Discontinued       08/29/2024   12:12 PM  Advanced Directives  Does Patient Have a Medical Advance Directive? No   Advance Care Planning is important because it: Ensures you receive medical care that aligns with your values, goals, and preferences. Provides guidance to your family and loved ones, reducing the emotional  burden of decision-making during critical moments.  Vision: Annual vision screenings are recommended for early detection of glaucoma, cataracts, and diabetic retinopathy. These exams can also reveal signs of chronic conditions such as diabetes and high blood pressure.  Dental: Annual dental screenings help detect early signs of oral cancer, gum disease, and other conditions linked to overall health, including heart disease and diabetes.  Please see the attached documents for additional preventive care recommendations.

## 2024-08-29 NOTE — Telephone Encounter (Signed)
 During pt awv today, pt stated that she has not heard anything back from pcp's office re msg from last week.   Pt would like to know if there is anything pcp could RX for her mouth due to burning sensation? Pls advise or call pt  See attach msg from last wk: Please advise if alternative medication can be prescribed or additional medication , diflucan  not helping sx.    FYI Only or Action Required?: Action required by provider: medication refill request, update on patient condition, and requesting additional medication or different medication , almost finished diflucan  and sx not getting better.   Patient was last seen in primary care on 08/15/2024 by Zollie Lowers, MD.   ------------------------------------------------------------

## 2024-08-29 NOTE — Progress Notes (Signed)
 Subjective:   Charlotte Aguirre is a 78 y.o. who presents for a Medicare Wellness preventive visit.  As a reminder, Annual Wellness Visits don't include a physical exam, and some assessments may be limited, especially if this visit is performed virtually. We may recommend an in-person follow-up visit with your provider if needed.  Visit Complete: Virtual I connected with  [name] on 08/29/24 by a audio enabled telemedicine application and verified that I am speaking with the correct person using two identifiers.  Patient Location: Home  Provider Location: Office/Clinic  I discussed the limitations of evaluation and management by telemedicine. The patient expressed understanding and agreed to proceed.  Vital Signs: Because this visit was a virtual/telehealth visit, some criteria may be missing or patient reported. Any vitals not documented were not able to be obtained and vitals that have been documented are patient reported.  VideoDeclined- This patient declined Librarian, academic. Therefore the visit was completed with audio only.  Persons Participating in Visit: Patient.  AWV Questionnaire: No: Patient Medicare AWV questionnaire was not completed prior to this visit.  Cardiac Risk Factors include: advanced age (>61men, >67 women);dyslipidemia     Objective:    Today's Vitals   08/29/24 1319  BP: 123/67  Pulse: 72  Weight: 191 lb (86.6 kg)  Height: 5' 1 (1.549 m)   Body mass index is 36.09 kg/m.     08/29/2024   12:12 PM 08/24/2023    1:36 PM 06/25/2023   11:10 AM 06/01/2023    8:57 AM 12/24/2022    1:57 PM 12/21/2022    7:46 AM 12/21/2022    7:42 AM  Advanced Directives  Does Patient Have a Medical Advance Directive? No No No No No No No  Would patient like information on creating a medical advance directive?   Yes (MAU/Ambulatory/Procedural Areas - Information given)    No - Patient declined    Current Medications (verified) Outpatient  Encounter Medications as of 08/29/2024  Medication Sig   cetirizine  (ZYRTEC ) 5 MG tablet Take 1 tablet (5 mg total) by mouth daily.   Cholecalciferol (VITAMIN D -3 PO) Take 1 tablet by mouth daily. 2000 units   fluconazole  (DIFLUCAN ) 100 MG tablet Take two with first dose. Then starting the next day take one daily until all are taken.   fluticasone  (FLONASE ) 50 MCG/ACT nasal spray Place 2 sprays into both nostrils daily.   gabapentin  (NEURONTIN ) 300 MG capsule TAKE 1 CAPSULE BY MOUTH THREE TIMES A DAY   hydrochlorothiazide  (HYDRODIURIL ) 25 MG tablet Take 1 tablet (25 mg total) by mouth daily.   LINZESS  72 MCG capsule TAKE 1 CAPSULE BY MOUTH DAILY BEFORE BREAKFAST.   Melatonin 1 MG CAPS Take 5 mg by mouth.   meloxicam (MOBIC) 7.5 MG tablet Take 1 tablet (7.5 mg total) by mouth daily. For joint and muscle pain   Multiple Vitamins-Minerals (PRESERVISION AREDS 2 PO) daily.   ondansetron  (ZOFRAN ) 4 MG tablet Take 1 tablet (4 mg total) by mouth every 8 (eight) hours as needed for nausea or vomiting.   pantoprazole  (PROTONIX ) 40 MG tablet Take 1 tablet (40 mg total) by mouth daily.   potassium chloride  (KLOR-CON ) 10 MEQ tablet Take 1 tablet (10 mEq total) by mouth daily.   predniSONE  (DELTASONE ) 20 MG tablet One twice daily with food for 2 weeks. Then one daily for 2 weeks   propranolol  (INDERAL ) 10 MG tablet Take 1 tablet (10 mg total) by mouth 3 (three) times daily as  needed.   simvastatin  (ZOCOR ) 40 MG tablet Take 1 tablet (40 mg total) by mouth daily.   No facility-administered encounter medications on file as of 08/29/2024.    Allergies (verified) Aspirin, Bactrim  [sulfamethoxazole -trimethoprim ], and Topiramate    History: Past Medical History:  Diagnosis Date   Anxiety    situational anxiety/stress   Arthritis    thumb and fingers   Cataract    bilateral -LEFT sx completed   Fibromyalgia    GERD (gastroesophageal reflux disease)    on meds   Hyperlipidemia    on meds    Hypertension    on meds   IBS (irritable bowel syndrome)    Memory changes    Osteopenia    Peripheral neuropathy    bilateral feet   Vitamin D  insufficiency    Past Surgical History:  Procedure Laterality Date   ABDOMINAL HYSTERECTOMY     uterus frist and 10 years later both ovaries removed   APPENDECTOMY     BLADDER SURGERY     bladder tack   BREAST EXCISIONAL BIOPSY     CATARACT EXTRACTION Left 01/23/2022   CATARACT EXTRACTION Right    CHOLECYSTECTOMY     CHOLECYSTECTOMY     COLONOSCOPY N/A 03/22/2014   Procedure: COLONOSCOPY;  Surgeon: Claudis RAYMOND Rivet, MD;  Location: [facility];  Service: Endoscopy;  Laterality: N/A;  930   OOPHORECTOMY     TOE SURGERY Left    2nd toe on RIGHT foot w/pins   WISDOM TOOTH EXTRACTION     Family History  Problem Relation Age of Onset   Migraines Mother    Heart failure Mother    Heart failure Father    Heart attack Father    Glaucoma Father    Heart failure Sister    Heart attack Sister    Diabetes Sister    Obesity Sister    COPD Sister        from 2nd hand smoke - never a smoker   Osteoporosis Sister    Colon polyps Sister    Migraines Brother    Dementia Brother    Heart attack Brother    Heart failure Brother    Heart attack Brother    Cancer Brother        LYMPHOMA   Heart attack Brother    Stomach cancer Brother        lymphoma   Cancer Brother        lymphoma-hip   Thyroid  disease Son    Throat cancer Son    Tongue cancer Son    Migraines Nephew    Colon cancer Neg Hx    Esophageal cancer Neg Hx    Rectal cancer Neg Hx    Breast cancer Neg Hx    Social History   Socioeconomic History   Marital status: Married    Spouse name: Nancyann   Number of children: 2   Years of education: 12   Highest education level: High school graduate  Occupational History   Occupation: Retired    Associate Professor: [employer]  Tobacco Use   Smoking status: Former    Current packs/day: 0.00    Average packs/day: 1.5  packs/day for 30.0 years (45.0 ttl pk-yrs)    Types: Cigarettes    Start date: 11/03/1958    Quit date: 11/03/1988    Years since quitting: 35.8   Smokeless tobacco: Never  Vaping Use   Vaping status: Never Used  Substance and Sexual Activity   Alcohol use:  No   Drug use: No   Sexual activity: Yes    Partners: Male  Other Topics Concern   Not on file  Social History Narrative   Retired. Lives with husband Nancyann. 2 sons, do not live locally. One small dog. She enjoys crocheting and quilting. Is involved in her church.   Social Drivers of Corporate Investment Banker Strain: Low Risk  (08/29/2024)   Overall Financial Resource Strain (CARDIA)    Difficulty of Paying Living Expenses: Not hard at all  Food Insecurity: No Food Insecurity (08/29/2024)   Hunger Vital Sign    Worried About Running Out of Food in the Last Year: Never true    Ran Out of Food in the Last Year: Never true  Transportation Needs: No Transportation Needs (08/29/2024)   PRAPARE - Administrator, Civil Service (Medical): No    Lack of Transportation (Non-Medical): No  Physical Activity: Insufficiently Active (08/29/2024)   Exercise Vital Sign    Days of Exercise per Week: 5 days    Minutes of Exercise per Session: 20 min  Stress: No Stress Concern Present (08/29/2024)   [employer] of Occupational Health - Occupational Stress Questionnaire    Feeling of Stress: Not at all  Social Connections: Moderately Integrated (08/29/2024)   Social Connection and Isolation Panel    Frequency of Communication with Friends and Family: More than three times a week    Frequency of Social Gatherings with Friends and Family: More than three times a week    Attends Religious Services: More than 4 times per year    Active Member of [employer] or Organizations: No    Attends Engineer, Structural: Never    Marital Status: Married    Tobacco Counseling Counseling given: Yes    Clinical  Intake:  Pre-visit preparation completed: Yes  Pain : No/denies pain     BMI - recorded: 36.09 Nutritional Status: BMI > 30  Obese Nutritional Risks: None Diabetes: No  Lab Results  Component Value Date   HGBA1C 5.5 05/09/2024   HGBA1C 5.5 01/05/2024   HGBA1C 5.8 11/26/2017     How often do you need to have someone help you when you read instructions, pamphlets, or other written materials from your doctor or pharmacy?: 1 - Never  Interpreter Needed?: No  Information entered by :: alia t/cma   Activities of Daily Living     08/29/2024   12:10 PM  In your present state of health, do you have any difficulty performing the following activities:  Hearing? 1  Vision? 0  Difficulty concentrating or making decisions? 0  Walking or climbing stairs? 1  Dressing or bathing? 0  Doing errands, shopping? 0  Preparing Food and eating ? N  Using the Toilet? N  In the past six months, have you accidently leaked urine? N  Do you have problems with loss of bowel control? N  Managing your Medications? N  Managing your Finances? N  Housekeeping or managing your Housekeeping? N    Patient Care Team: Lavell Bari LABOR, FNP as PCP - General (Family Medicine) Lavona Agent, MD as PCP - Cardiology (Cardiology) Golda Claudis PENNER, MD (Inactive) as Consulting Physician (Gastroenterology) Ladora Ross Lacy Phebe, MD as Referring Physician (Optometry) Skeet Juliene SAUNDERS, DO as Consulting Physician (Neurology)  I have updated your Care Teams any recent Medical Services you may have received from other providers in the past year.     Assessment:   This is a  routine wellness examination for [facility].  Hearing/Vision screen Hearing Screening - Comments:: Pt have hearing dif Vision Screening - Comments:: Pt wear glasses/pt [facility] in [place]/last ov 2025   Goals Addressed             This Visit's Progress    Exercise 3x per week (30 min per time)   On track    Exercising at the [facility] [place] [place] with  Silver Sneakers is a great option. 06/23/22 -        Depression Screen     08/29/2024   12:12 PM 08/15/2024    1:34 PM 07/27/2024    2:05 PM 06/01/2024   10:43 AM 05/09/2024   10:14 AM 01/05/2024   11:17 AM 11/23/2023    3:35 PM  PHQ 2/9 Scores  PHQ - 2 Score 0 0 0 0 0 0 0  PHQ- 9 Score    0       Fall Risk     08/29/2024   12:05 PM 08/15/2024    1:33 PM 07/27/2024    2:05 PM 06/01/2024   10:43 AM 05/09/2024   10:15 AM  Fall Risk   Falls in the past year? 0 0 0 0 0  Number falls in past yr: 0 0 0 0   Injury with Fall? 0 0 0 0   Risk for fall due to : No Fall Risks No Fall Risks Impaired balance/gait;Impaired mobility No Fall Risks   Follow up Falls evaluation completed Falls evaluation completed Falls evaluation completed Falls evaluation completed     MEDICARE RISK AT HOME:  Medicare Risk at Home Any stairs in or around the home?: No If so, are there any without handrails?: No Home free of loose throw rugs in walkways, pet beds, electrical cords, etc?: Yes Adequate lighting in your home to reduce risk of falls?: Yes Life alert?: No Use of a cane, walker or w/c?: Yes Grab bars in the bathroom?: Yes Shower chair or bench in shower?: Yes Elevated toilet seat or a handicapped toilet?: Yes  TIMED UP AND GO:  Was the test performed?  no  Cognitive Function: 6CIT completed    10/07/2018    9:53 AM 03/28/2015    2:52 PM  MMSE - Mini Mental State Exam  Orientation to time 5 5   Orientation to Place 5 5   Registration 3 3   Attention/ Calculation 5 5   Recall 3 3   Language- name 2 objects 2 2   Language- repeat 1 1  Language- follow 3 step command 3   Language- read & follow direction 1 1   Write a sentence 1 1   Copy design 1 1   Total score 30      Data saved with a previous flowsheet row definition        08/29/2024   12:12 PM 06/25/2023   11:11 AM 06/23/2022   12:14 PM 05/13/2021    1:25 PM 01/23/2020    9:20 AM  6CIT Screen  What Year? 0 points 0 points 0  points 0 points 0 points  What month? 0 points 0 points 0 points 0 points 0 points  What time? 0 points 0 points 0 points 0 points 0 points  Count back from 20 0 points 0 points 0 points 0 points 0 points  Months in reverse 0 points 0 points 0 points 0 points 0 points  Repeat phrase 0 points 0 points 0 points 0 points 0 points  Total  Score 0 points 0 points 0 points 0 points 0 points    Immunizations Immunization History  Administered Date(s) Administered   Fluad Quad(high Dose 65+) 07/08/2019, 07/19/2021, 07/14/2022   INFLUENZA, HIGH DOSE SEASONAL PF 07/24/2014, 08/07/2016, 08/11/2017, 08/11/2017, 08/31/2018, 07/08/2019, 08/06/2023, 07/27/2024   Influenza,inj,Quad PF,6+ Mos 08/21/2015   Influenza-Unspecified 07/23/2020   Moderna Sars-Covid-2 Vaccination 12/13/2019, 01/10/2020, 10/19/2020, 04/17/2021   Pneumococcal Conjugate-13 03/28/2015   Pneumococcal Polysaccharide-23 02/02/2014   Tdap 05/31/2020   Zoster Recombinant(Shingrix) 05/02/2021, 02/13/2022    Screening Tests Health Maintenance  Topic Date Due   Medicare Annual Wellness (AWV)  06/24/2024   COVID-19 Vaccine (5 - 2025-26 season) 07/04/2024   DEXA SCAN  09/16/2024   Mammogram  01/26/2025   DTaP/Tdap/Td (2 - Td or Tdap) 05/31/2030   Pneumococcal Vaccine: 50+ Years  Completed   Influenza Vaccine  Completed   Hepatitis C Screening  Completed   Zoster Vaccines- Shingrix  Completed   Meningococcal B Vaccine  Aged Out   Colonoscopy  Discontinued    Health Maintenance Items Addressed: See Nurse Notes at the end of this note  Additional Screening:  Vision Screening: Recommended annual ophthalmology exams for early detection of glaucoma and other disorders of the eye. Is the patient up to date with their annual eye exam?  Yes  Who is the provider or what is the name of the office in which the patient attends annual eye exams? [facility] in [place], [place]  Dental Screening: Recommended annual dental exams for proper oral  hygiene  Community Resource Referral / Chronic Care Management: CRR required this visit?  No   CCM required this visit?  No   Plan:    I have personally reviewed and noted the following in the patient's chart:   Medical and social history Use of alcohol, tobacco or illicit drugs  Current medications and supplements including opioid prescriptions. Patient is not currently taking opioid prescriptions. Functional ability and status Nutritional status Physical activity Advanced directives List of other physicians Hospitalizations, surgeries, and ER visits in previous 12 months Vitals Screenings to include cognitive, depression, and falls Referrals and appointments  In addition, I have reviewed and discussed with patient certain preventive protocols, quality metrics, and best practice recommendations. A written personalized care plan for preventive services as well as general preventive health recommendations were provided to patient.   Ozie Ned, CMA   08/29/2024   After Visit Summary: (MyChart) Due to this being a telephonic visit, the after visit summary with patients personalized plan was offered to patient via MyChart   Notes: Nothing significant to report at this time.

## 2024-08-30 ENCOUNTER — Other Ambulatory Visit: Payer: Self-pay | Admitting: Family

## 2024-08-30 MED ORDER — NYSTATIN 100000 UNIT/ML MT SUSP: 5.0000 mL | Freq: Four times a day (QID) | OROMUCOSAL | 0 refills | Status: AC

## 2024-08-30 NOTE — Telephone Encounter (Signed)
 Patient aware and verbalized understanding.

## 2024-08-30 NOTE — Telephone Encounter (Signed)
 Nystatin  oral rx sent to pharmacy.

## 2024-09-09 ENCOUNTER — Encounter: Payer: Self-pay | Admitting: Family

## 2024-09-09 ENCOUNTER — Ambulatory Visit (INDEPENDENT_AMBULATORY_CARE_PROVIDER_SITE_OTHER): Admitting: Family

## 2024-09-09 ENCOUNTER — Ambulatory Visit: Payer: Self-pay | Admitting: Family

## 2024-09-09 VITALS — BP 111/70 | HR 79 | Temp 97.0°F | Ht 61.0 in | Wt 188.8 lb

## 2024-09-09 DIAGNOSIS — I10 Essential (primary) hypertension: Secondary | ICD-10-CM | POA: Diagnosis not present

## 2024-09-09 DIAGNOSIS — K219 Gastro-esophageal reflux disease without esophagitis: Secondary | ICD-10-CM

## 2024-09-09 DIAGNOSIS — M160 Bilateral primary osteoarthritis of hip: Secondary | ICD-10-CM | POA: Diagnosis not present

## 2024-09-09 DIAGNOSIS — I700 Atherosclerosis of aorta: Secondary | ICD-10-CM | POA: Diagnosis not present

## 2024-09-09 DIAGNOSIS — E785 Hyperlipidemia, unspecified: Secondary | ICD-10-CM | POA: Diagnosis not present

## 2024-09-09 DIAGNOSIS — R7303 Prediabetes: Secondary | ICD-10-CM | POA: Diagnosis not present

## 2024-09-09 DIAGNOSIS — R42 Dizziness and giddiness: Secondary | ICD-10-CM | POA: Diagnosis not present

## 2024-09-09 MED ORDER — MECLIZINE HCL 12.5 MG PO TABS: 12.5000 mg | ORAL_TABLET | Freq: Three times a day (TID) | ORAL | 0 refills | Status: AC | PRN

## 2024-09-09 NOTE — Progress Notes (Signed)
 Subjective:    Patient ID: Charlotte Aguirre, female    DOB: [birth year], 78 y.o.   MRN: [id number]  Chief Complaint  Patient presents with   Medical Management of Chronic Issues    Having sharp pain in heads on right side where it started now all over. Patient has hx of headaches   Dizziness    Wants rx for meclizine    Pt presents to the office today for chronic follow up.   She is followed by GI for GERD, IBS, diverticulosis, and abdominal pain as needed. These are stable.    She is followed by Cardiologists for HTN and palpitations as needed.    She is morbid obese with a BMI of 35 and hypertension and hyperlipidemia.   Has aortic atherosclerosis and takes Zocor .   She is followed by Ortho for left hip pain. Diagnosed with bursitis and getting steroid injections.  Hypertension This is a chronic problem. The current episode started more than 1 year ago. The problem has been resolved since onset. The problem is controlled. Associated symptoms include malaise/fatigue and shortness of breath. Pertinent negatives include no blurred vision or peripheral edema. Risk factors for coronary artery disease include dyslipidemia, obesity and sedentary lifestyle. The current treatment provides moderate improvement.  Gastroesophageal Reflux She complains of belching and heartburn. This is a chronic problem. The current episode started more than 1 year ago. The problem occurs rarely. The symptoms are aggravated by certain foods. Risk factors include obesity. She has tried a PPI for the symptoms. The treatment provided significant relief.  Arthritis Presents for follow-up visit. She complains of pain and stiffness. The symptoms have been stable. Affected locations include the left knee, right knee, right MCP, left MCP, left hip and left wrist (back). Her pain is at a severity of 4/10 (hip is 8-9 at times).  Hyperlipidemia This is a chronic problem. The current episode started more than 1 year ago. The  problem is controlled. Recent lipid tests were reviewed and are normal. Exacerbating diseases include obesity. Associated symptoms include shortness of breath. Current antihyperlipidemic treatment includes statins. The current treatment provides moderate improvement of lipids. Risk factors for coronary artery disease include dyslipidemia, hypertension, a sedentary lifestyle, post-menopausal and obesity.  Constipation This is a chronic problem. The current episode started more than 1 year ago. The problem has been resolved since onset. Her stool frequency is 2 to 3 times per week. Risk factors include obesity. She has tried laxatives (Linzess ) for the symptoms. The treatment provided mild relief.  Diabetes Diabetes type: prediabetic. Hypoglycemia symptoms include dizziness. Pertinent negatives for diabetes include no blurred vision and no foot paresthesias. Risk factors for coronary artery disease include dyslipidemia, hypertension, sedentary lifestyle and post-menopausal. (Does not check glucose at home)  Dizziness This is a chronic problem. The current episode started more than 1 year ago. The problem occurs intermittently. She has tried rest for the symptoms. The treatment provided moderate relief.      Review of Systems  Constitutional:  Positive for malaise/fatigue.  Eyes:  Negative for blurred vision.  Respiratory:  Positive for shortness of breath.   Gastrointestinal:  Positive for constipation and heartburn.  Musculoskeletal:  Positive for stiffness.  Neurological:  Positive for dizziness.  All other systems reviewed and are negative.  Family History  Problem Relation Age of Onset   Migraines Mother    Heart failure Mother    Heart failure Father    Heart attack Father    Glaucoma Father  Heart failure Sister    Heart attack Sister    Diabetes Sister    Obesity Sister    COPD Sister        from 2nd hand smoke - never a smoker   Osteoporosis Sister    Colon polyps Sister     Migraines Brother    Dementia Brother    Heart attack Brother    Heart failure Brother    Heart attack Brother    Cancer Brother        LYMPHOMA   Heart attack Brother    Stomach cancer Brother        lymphoma   Cancer Brother        lymphoma-hip   Thyroid  disease Son    Throat cancer Son    Tongue cancer Son    Migraines Nephew    Colon cancer Neg Hx    Esophageal cancer Neg Hx    Rectal cancer Neg Hx    Breast cancer Neg Hx    Social History   Socioeconomic History   Marital status: Married    Spouse name: Nancyann   Number of children: 2   Years of education: 12   Highest education level: High school graduate  Occupational History   Occupation: Retired    Associate Professor: [employer]  Tobacco Use   Smoking status: Former    Current packs/day: 0.00    Average packs/day: 1.5 packs/day for 30.0 years (45.0 ttl pk-yrs)    Types: Cigarettes    Start date: 11/03/1958    Quit date: 11/03/1988    Years since quitting: 35.8   Smokeless tobacco: Never  Vaping Use   Vaping status: Never Used  Substance and Sexual Activity   Alcohol use: No   Drug use: No   Sexual activity: Yes    Partners: Male  Other Topics Concern   Not on file  Social History Narrative   Retired. Lives with husband Nancyann. 2 sons, do not live locally. One small dog. She enjoys crocheting and quilting. Is involved in her church.   Social Drivers of Corporate Investment Banker Strain: Low Risk  (08/29/2024)   Overall Financial Resource Strain (CARDIA)    Difficulty of Paying Living Expenses: Not hard at all  Food Insecurity: No Food Insecurity (08/29/2024)   Hunger Vital Sign    Worried About Running Out of Food in the Last Year: Never true    Ran Out of Food in the Last Year: Never true  Transportation Needs: No Transportation Needs (08/29/2024)   PRAPARE - Administrator, Civil Service (Medical): No    Lack of Transportation (Non-Medical): No  Physical Activity: Insufficiently Active  (08/29/2024)   Exercise Vital Sign    Days of Exercise per Week: 5 days    Minutes of Exercise per Session: 20 min  Stress: No Stress Concern Present (08/29/2024)   [employer] of Occupational Health - Occupational Stress Questionnaire    Feeling of Stress: Not at all  Social Connections: Moderately Integrated (08/29/2024)   Social Connection and Isolation Panel    Frequency of Communication with Friends and Family: More than three times a week    Frequency of Social Gatherings with Friends and Family: More than three times a week    Attends Religious Services: More than 4 times per year    Active Member of [employer] or Organizations: No    Attends Banker Meetings: Never    Marital Status: Married  Objective:   Physical Exam Vitals reviewed.  Constitutional:      General: She is not in acute distress.    Appearance: She is well-developed. She is obese.  HENT:     Head: Normocephalic and atraumatic.     Right Ear: Tympanic membrane normal.     Left Ear: Tympanic membrane normal.  Eyes:     Pupils: Pupils are equal, round, and reactive to light.  Neck:     Thyroid : No thyromegaly.  Cardiovascular:     Rate and Rhythm: Normal rate and regular rhythm.     Heart sounds: Normal heart sounds. No murmur heard. Pulmonary:     Effort: Pulmonary effort is normal. No respiratory distress.     Breath sounds: Normal breath sounds. No wheezing.  Abdominal:     General: Bowel sounds are normal. There is no distension.     Palpations: Abdomen is soft.     Tenderness: There is no abdominal tenderness.  Musculoskeletal:        General: No tenderness. Normal range of motion.     Cervical back: Normal range of motion and neck supple.     Comments: Full ROM of left hip, pain with standing and walking  Skin:    General: Skin is warm and dry.  Neurological:     Mental Status: She is alert and oriented to person, place, and time.     Cranial Nerves: No cranial nerve  deficit.     Deep Tendon Reflexes: Reflexes are normal and symmetric.  Psychiatric:        Behavior: Behavior normal.        Thought Content: Thought content normal.        Judgment: Judgment normal.          BP 111/70   Pulse 79   Temp (!) 97 F (36.1 C) (Temporal)   Ht 5' 1 (1.549 m)   Wt 188 lb 12.8 oz (85.6 kg)   SpO2 (!) 79%   BMI 35.67 kg/m   Assessment & Plan:  Ronal DEL Kliebert comes in today with chief complaint of Medical Management of Chronic Issues (Having sharp pain in heads on right side where it started now all over. Patient has hx of headaches) and Dizziness (Wants rx for meclizine )   Diagnosis and orders addressed:  1. Prediabetes - CMP14+EGFR - CBC with Differential/Platelet  2. Primary osteoarthritis of both hips  - CMP14+EGFR - CBC with Differential/Platelet  3. Morbid obesity (HCC) - CMP14+EGFR - CBC with Differential/Platelet  4. Hyperlipidemia with target LDL less than 100 - CMP14+EGFR - CBC with Differential/Platelet  5. Gastroesophageal reflux disease without esophagitis  - CMP14+EGFR - CBC with Differential/Platelet  6. Essential hypertension - CMP14+EGFR - CBC with Differential/Platelet  7. Aortic atherosclerosis - CMP14+EGFR - CBC with Differential/Platelet  8. Vertigo (Primary) Referral to ENT and PT Epley exercises discussed  Antivert as needed Fall precautions discussed  - Ambulatory referral to Physical Therapy - meclizine (ANTIVERT) 12.5 MG tablet; Take 1 tablet (12.5 mg total) by mouth 3 (three) times daily as needed for dizziness.  Dispense: 30 tablet; Refill: 0 - Ambulatory referral to ENT - CMP14+EGFR - CBC with Differential/Platelet    Labs pending Continue current medications  Health Maintenance reviewed Diet and exercise encouraged  Follow up plan: 4 months    Bari Learn, FNP

## 2024-09-09 NOTE — Patient Instructions (Signed)
 Benign Positional Vertigo Vertigo is the feeling that you or your surroundings are moving when they are not. Benign positional vertigo is the most common form of vertigo. This is usually a harmless condition (benign). This condition is positional. This means that symptoms are triggered by certain movements and positions. This condition can be dangerous if it occurs while you are doing something that could cause harm to yourself or others. This includes activities such as driving or operating machinery. What are the causes? The inner ear has fluid-filled canals that help your brain sense movement and balance. When the fluid moves, the brain receives messages about your body's position. With benign positional vertigo, calcium crystals in the inner ear break free and disturb the inner ear area. This causes your brain to receive confusing messages about your body's position. What increases the risk? You are more likely to develop this condition if: You are a woman. You are 64 years of age or older. You have recently had a head injury. You have an inner ear disease. What are the signs or symptoms? Symptoms of this condition usually happen when you move your head or your eyes in different directions. Symptoms may start suddenly and usually last for less than a minute. They include: Loss of balance and falling. Feeling like you are spinning or moving. Feeling like your surroundings are spinning or moving. Nausea and vomiting. Blurred vision. Dizziness. Involuntary eye movement (nystagmus). Symptoms can be mild and cause only minor problems, or they can be severe and interfere with daily life. Episodes of benign positional vertigo may return (recur) over time. Symptoms may also improve over time. How is this diagnosed? This condition may be diagnosed based on: Your medical history. A physical exam of the head, neck, and ears. Positional tests to check for or stimulate vertigo. You may be asked to  turn your head and change positions, such as going from sitting to lying down. A health care provider will watch for symptoms of vertigo. You may be referred to a health care provider who specializes in ear, nose, and throat problems (ENT or otolaryngologist) or a provider who specializes in disorders of the nervous system (neurologist). How is this treated?  This condition may be treated in a session in which your health care provider moves your head in specific positions to help the displaced crystals in your inner ear move. Treatment for this condition may take several sessions. Surgery may be needed in severe cases, but this is rare. In some cases, benign positional vertigo may resolve on its own in 2-4 weeks. Follow these instructions at home: Safety Move slowly. Avoid sudden body or head movements or certain positions, as told by your health care provider. Avoid driving or operating machinery until your health care provider says it is safe. Avoid doing any tasks that would be dangerous to you or others if vertigo occurs. If you have trouble walking or keeping your balance, try using a cane for stability. If you feel dizzy or unstable, sit down right away. Return to your normal activities as told by your health care provider. Ask your health care provider what activities are safe for you. General instructions Take over-the-counter and prescription medicines only as told by your health care provider. Drink enough fluid to keep your urine pale yellow. Keep all follow-up visits. This is important. Contact a health care provider if: You have a fever. Your condition gets worse or you develop new symptoms. Your family or friends notice any behavioral changes.  You have nausea or vomiting that gets worse. You have numbness or a prickling and tingling sensation. Get help right away if you: Have difficulty speaking or moving. Are always dizzy or faint. Develop severe headaches. Have weakness in  your legs or arms. Have changes in your hearing or vision. Develop a stiff neck. Develop sensitivity to light. These symptoms may represent a serious problem that is an emergency. Do not wait to see if the symptoms will go away. Get medical help right away. Call your local emergency services (911 in the [place] Do not drive yourself to the hospital. Summary Vertigo is the feeling that you or your surroundings are moving when they are not. Benign positional vertigo is the most common form of vertigo. This condition is caused by calcium crystals in the inner ear that become displaced. This causes a disturbance in an area of the inner ear that helps your brain sense movement and balance. Symptoms include loss of balance and falling, feeling that you or your surroundings are moving, nausea and vomiting, and blurred vision. This condition can be diagnosed based on symptoms, a physical exam, and positional tests. Follow safety instructions as told by your health care provider and keep all follow-up visits. This is important. This information is not intended to replace advice given to you by your health care provider. Make sure you discuss any questions you have with your health care provider. Document Revised: 05/11/2023 Document Reviewed: 05/11/2023 Elsevier Patient Education  2024 [employer].

## 2024-09-10 LAB — CMP14+EGFR
ALT: 12 IU/L (ref 0–32)
AST: 18 IU/L (ref 0–40)
Albumin: 4.1 g/dL (ref 3.8–4.8)
Alkaline Phosphatase: 125 IU/L (ref 49–135)
BUN/Creatinine Ratio: 19 (ref 12–28)
BUN: 16 mg/dL (ref 8–27)
Bilirubin Total: 0.2 mg/dL (ref 0.0–1.2)
CO2: 25 mmol/L (ref 20–29)
Calcium: 9.7 mg/dL (ref 8.7–10.3)
Chloride: 101 mmol/L (ref 96–106)
Creatinine, Ser: 0.83 mg/dL (ref 0.57–1.00)
Globulin, Total: 2.3 g/dL (ref 1.5–4.5)
Glucose: 94 mg/dL (ref 70–99)
Potassium: 4 mmol/L (ref 3.5–5.2)
Sodium: 143 mmol/L (ref 134–144)
Total Protein: 6.4 g/dL (ref 6.0–8.5)
eGFR: 72 mL/min/1.73 (ref >59)

## 2024-09-10 LAB — CBC WITH DIFFERENTIAL/PLATELET
Basophils Absolute: 0.1 x10E3/uL (ref 0.0–0.2)
Basos: 1 %
EOS (ABSOLUTE): 0.2 x10E3/uL (ref 0.0–0.4)
Eos: 2 %
Hematocrit: 46.9 % — ABNORMAL HIGH (ref 34.0–46.6)
Hemoglobin: 14.9 g/dL (ref 11.1–15.9)
Immature Grans (Abs): 0 x10E3/uL (ref 0.0–0.1)
Immature Granulocytes: 0 %
Lymphocytes Absolute: 2.6 x10E3/uL (ref 0.7–3.1)
Lymphs: 28 %
MCH: 27.6 pg (ref 26.6–33.0)
MCHC: 31.8 g/dL (ref 31.5–35.7)
MCV: 87 fL (ref 79–97)
Monocytes Absolute: 0.7 x10E3/uL (ref 0.1–0.9)
Monocytes: 7 %
Neutrophils Absolute: 5.8 x10E3/uL (ref 1.4–7.0)
Neutrophils: 62 %
Platelets: 341 x10E3/uL (ref 150–450)
RBC: 5.39 x10E6/uL — ABNORMAL HIGH (ref 3.77–5.28)
RDW: 13.2 % (ref 11.7–15.4)
WBC: 9.4 x10E3/uL (ref 3.4–10.8)

## 2024-09-12 ENCOUNTER — Ambulatory Visit: Payer: Self-pay | Admitting: Family

## 2024-09-23 DIAGNOSIS — M7062 Trochanteric bursitis, left hip: Secondary | ICD-10-CM | POA: Diagnosis not present

## 2024-10-14 ENCOUNTER — Other Ambulatory Visit: Payer: Self-pay

## 2024-10-14 ENCOUNTER — Encounter (HOSPITAL_COMMUNITY): Payer: Self-pay

## 2024-10-14 ENCOUNTER — Ambulatory Visit (HOSPITAL_COMMUNITY)

## 2024-10-14 DIAGNOSIS — R42 Dizziness and giddiness: Secondary | ICD-10-CM

## 2024-10-14 DIAGNOSIS — H8113 Benign paroxysmal vertigo, bilateral: Secondary | ICD-10-CM

## 2024-10-14 NOTE — Therapy (Signed)
 OUTPATIENT PHYSICAL THERAPY VESTIBULAR EVALUATION    PHYSICAL THERAPY DISCHARGE SUMMARY  Visits from Start of Care: 0  Current functional level related to goals / functional outcomes: Pt not returned since evaluation.   Remaining deficits: Pt not returned since evaluation.   Education / Equipment: Pt not returned since evaluation.   Patient agrees to discharge. Patient goals were not met. Patient is being discharged due to the patient's request.  Pt was called concerning plan of care. Pt still feeling vertigo but is insistent on not returning as therapy makes her feel funny. Pt educated on vertigo and benefits of therapy for BPPV. Pt to be discharged from this episode of care due to her request.   Lang Ada, PT, DPT [facility] Office: [phone] 3:19 PM, 10/20/2024   Patient Name: Charlotte Aguirre MRN: [id number] DOB:[birth year], 78 y.o., female Today's Date: 10/20/2024  END OF SESSION:    Past Medical History:  Diagnosis Date   Anxiety    situational anxiety/stress   Arthritis    thumb and fingers   Cataract    bilateral -LEFT sx completed   Fibromyalgia    GERD (gastroesophageal reflux disease)    on meds   Hyperlipidemia    on meds   Hypertension    on meds   IBS (irritable bowel syndrome)    Memory changes    Osteopenia    Peripheral neuropathy    bilateral feet   Vitamin D  insufficiency    Past Surgical History:  Procedure Laterality Date   ABDOMINAL HYSTERECTOMY     uterus frist and 10 years later both ovaries removed   APPENDECTOMY     BLADDER SURGERY     bladder tack   BREAST EXCISIONAL BIOPSY     CATARACT EXTRACTION Left 01/23/2022   CATARACT EXTRACTION Right    CHOLECYSTECTOMY     CHOLECYSTECTOMY     COLONOSCOPY N/A 03/22/2014   Procedure: COLONOSCOPY;  Surgeon: Claudis RAYMOND Rivet, MD;  Location: [facility];  Service: Endoscopy;  Laterality: N/A;  930   OOPHORECTOMY     TOE SURGERY Left    2nd toe on RIGHT foot  w/pins   WISDOM TOOTH EXTRACTION     Patient Active Problem List   Diagnosis Date Noted   Lipoma of groin 03/21/2024   Laryngopharyngeal reflux (LPR) 06/16/2023   Dysphonia 06/16/2023   Aortic atherosclerosis 01/20/2023   Pseudophakia of both eyes 04/14/2022   Epiretinal membrane, left eye 03/10/2022   Exudative age-related macular degeneration of left eye with active choroidal neovascularization (HCC) 03/10/2022   Intermediate stage nonexudative age-related macular degeneration of both eyes 03/10/2022   Retinal pigment epithelial detachment 03/10/2022   Edema 04/17/2021   Hypokalemia 08/02/2019   Vocal cord atrophy 12/02/2017   Morbid obesity (HCC) 04/15/2016   Metabolic syndrome 04/15/2016   Slow transit constipation 07/27/2015   Rectocele 10/31/2014   Osteopenia 09/13/2014   Prediabetes 06/23/2014   Trigeminal neuralgia syndrome 02/02/2014   Vitamin D  deficiency 02/02/2014   Memory impairment 12/12/2013   GERD (gastroesophageal reflux disease)    Essential hypertension    Irritable bowel syndrome 11/06/2009   Hereditary and idiopathic peripheral neuropathy 02/28/2009   Hyperlipidemia with target LDL less than 100 02/27/2009   Allergic rhinitis 02/27/2009   Diverticulosis of colon 02/27/2009   Osteoarthritis 02/27/2009   Myalgia and myositis 02/27/2009   PEPTIC ULCER DISEASE, HX OF 02/27/2009    PCP: Lavell Bari LABOR, FNP REFERRING PROVIDER: Lavell Bari LABOR, FNP  REFERRING  DIAG: R42 (ICD-10-CM) - Vertigo  THERAPY DIAG:  BPPV (benign paroxysmal positional vertigo), bilateral - Plan: PT plan of care cert/re-cert  Dizziness and giddiness - Plan: PT plan of care cert/re-cert  ONSET DATE: 3 months ago  Rationale for Evaluation and Treatment: Rehabilitation  SUBJECTIVE:   SUBJECTIVE STATEMENT: Pt states she had vertigo 3 times in 7 weeks, takes meclizine  when she is dizzy, helps her sleep through it. Pt states she just tries to sleep the vertigo and meclizine  off.  Follow up with ENT in January. Pt states last dizziness spell was about a month ago, room spinning.     Pt accompanied by: self  PERTINENT HISTORY:    PAIN:  Are you having pain? Bursitis in left hip, had injection, just sore now  PRECAUTIONS: None  RED FLAGS: None   WEIGHT BEARING RESTRICTIONS: No  FALLS: Has patient fallen in last 6 months? No   PATIENT GOALS: pt would like to get rid of the dizziness for good  OBJECTIVE:  Note: Objective measures were completed at Evaluation unless otherwise noted.  DIAGNOSTIC FINDINGS: N/A   Cervical ROM:    Active A/PROM (deg) eval  Flexion WFL  Extension WFL  Right lateral flexion WFL  Left lateral flexion WFL  Right rotation WFL  Left rotation WFL  (Blank rows = not tested)   LOWER EXTREMITY MMT:   MMT Right eval Left eval  Hip flexion    Hip abduction    Hip adduction    Hip internal rotation    Hip external rotation    Knee flexion    Knee extension    Ankle dorsiflexion    Ankle plantarflexion    Ankle inversion    Ankle eversion    (Blank rows = not tested)    PATIENT SURVEYS:  DHI: TBA  VESTIBULAR ASSESSMENT:   OCULOMOTOR EXAM:  Ocular Alignment: normal  Ocular ROM: No Limitations  Spontaneous Nystagmus: absent  Gaze-Induced Nystagmus: absent  Smooth Pursuits: intact  Saccades: hypometric/undershoots  Convergence/Divergence: Pt has diplopia at baseline    VESTIBULAR - OCULAR REFLEX:   Slow VOR: Positive Bilaterally  VOR Cancellation: Comment: positive for funny feeling     POSITIONAL TESTING: Left Dix-Hallpike: upbeating, left nystagmus  MOTION SENSITIVITY:  Motion Sensitivity Quotient Intensity: 0 = none, 1 = Lightheaded, 2 = Mild, 3 = Moderate, 4 = Severe, 5 = Vomiting  Intensity  1. Sitting to supine   2. Supine to L side   3. Supine to R side   4. Supine to sitting   5. L Hallpike-Dix   6. Up from L    7. R Hallpike-Dix   8. Up from R    9. Sitting, head tipped to L knee    10. Head up from L knee   11. Sitting, head tipped to R knee   12. Head up from R knee   13. Sitting head turns x5   14.Sitting head nods x5   15. In stance, 180 turn to L    16. In stance, 180 turn to R     OTHOSTATICS: not done  FUNCTIONAL GAIT: none completed at this time  TREATMENT DATE:  10/14/2024   Evaluation: -ROM measured, Strength assessed, HEP prescribed, pt educated on prognosis, findings, and importance of HEP compliance if given.        Canalith Repositioning:  Epley Left: Number of Reps: 1, Response to Treatment: symptoms worsened/converted, and Comment: pt states she feels more fuzzy headed than when she got here   PATIENT EDUCATION: Education details: Pt was educated on findings of PT evaluation, prognosis, frequency of therapy visits and rationale, attendance policy, and HEP if given.   Person educated: Patient Education method: Explanation, Verbal cues, and Handouts Education comprehension: verbalized understanding, verbal cues required, and needs further education  HOME EXERCISE PROGRAM: Access Code: [email or web address] URL: [email or web address] Date: 10/14/2024 Prepared by: Lang Ada  Exercises - Seated Gaze Stabilization with Head Rotation  - 1 x daily - 7 x weekly - 3 sets - 10 reps - Seated VOR Cancellation  - 1 x daily - 7 x weekly - 3 sets - 10 reps - Seated Gaze Stabilization with Head Nod  - 1 x daily - 7 x weekly - 3 sets - 10 reps  GOALS: Goals reviewed with patient? No  SHORT TERM GOALS: Target date: 10/28/24  Pt will be independent with home exercise program in order to improve balance and decrease dizziness symptoms in order to decrease fall risk and improve function at home and work. Baseline:  Goal status: INITIAL  2.  Pt will report no vertigo symptoms for a week straight for improved quality of  life. Baseline:  Goal status: INITIAL  LONG TERM GOALS: Target date: 11/11/24   1.  Patient will demonstrate negative on left [facility] positional testing for decreased vertigo and increased positional tolerance. Baseline: positive at eval for nystagmus Goal status: INITIAL   2.  Patient will reduce perceived disability to moderate levels as indicated by <70 on Dizziness Handicap Inventory [facility]). Baseline: TBA Goal status: INITIAL  3.  Patient will report 50% or better improvement in their dizziness and imbalance symptoms overall in order for patient to be able to perform ADLs and resume prior activities. Baseline:  Goal status: INITIAL     ASSESSMENT:  CLINICAL IMPRESSION: Patient is a 78 y.o. female who was seen today for physical therapy evaluation and treatment for R42 (ICD-10-CM) - Vertigo.   Patient continues to demonstrate increased drunk episodes with horizontal VOR activities, decreased LE strength, decreased gait quality and balance. Patient also demonstrates decreased endurance with need for a couple sitting breaks during today's session. Patient able to progress dynamic balance and VOR activation exercises today with standing balance/VOR activities, good performance with verbal cueing. Patient would continue to benefit from skilled physical therapy for increased endurance with ambulation, increased LE strength, and improved balance for improved quality of life, improved independence with gait training and continued progress towards therapy goals.   OBJECTIVE IMPAIRMENTS: decreased activity tolerance, decreased balance, decreased knowledge of condition, decreased mobility, and dizziness.   ACTIVITY LIMITATIONS: carrying, lifting, bending, standing, squatting, stairs, transfers, and bed mobility  PARTICIPATION LIMITATIONS: meal prep, cleaning, laundry, driving, shopping, community activity, and yard work  PERSONAL FACTORS: Age, Fitness, Past/current experiences,  Time since onset of injury/illness/exacerbation, and 1 comorbidity: hx of vertigo  are also affecting patient's functional outcome.   REHAB POTENTIAL: Good  CLINICAL DECISION MAKING: Stable/uncomplicated  EVALUATION COMPLEXITY: Low   PLAN:  PT FREQUENCY: 1x/week  PT DURATION: 4 weeks  PLANNED INTERVENTIONS: 97110-Therapeutic exercises, 97530- Therapeutic activity, [id number]- Neuromuscular re-education, 97535- Self Care, [id number]- Manual therapy, [id number]-  Gait training, Patient/Family education, Balance training, Stair training, and Vestibular training  PLAN FOR NEXT SESSION: track symptoms, reassess left posterior canal, progress VOR activities to standing and walking. Review HEP and goals. DHI   Lang Ada, PT, DPT [facility] Office: [phone] 3:19 PM, 10/20/2024

## 2024-10-19 ENCOUNTER — Ambulatory Visit (HOSPITAL_COMMUNITY)

## 2024-10-24 ENCOUNTER — Ambulatory Visit (HOSPITAL_COMMUNITY)

## 2024-10-30 ENCOUNTER — Other Ambulatory Visit: Payer: Self-pay | Admitting: Family

## 2024-10-30 DIAGNOSIS — E785 Hyperlipidemia, unspecified: Secondary | ICD-10-CM

## 2024-10-31 ENCOUNTER — Ambulatory Visit (HOSPITAL_COMMUNITY)

## 2024-11-08 ENCOUNTER — Institutional Professional Consult (permissible substitution) (INDEPENDENT_AMBULATORY_CARE_PROVIDER_SITE_OTHER): Admitting: Otolaryngology

## 2024-11-11 ENCOUNTER — Ambulatory Visit (HOSPITAL_COMMUNITY)

## 2024-11-18 ENCOUNTER — Ambulatory Visit: Payer: Self-pay

## 2024-11-18 NOTE — Telephone Encounter (Signed)
 FYI Only or Action Required?: FYI only for provider: appointment scheduled on 11/21/24.  Patient was last seen in primary care on 09/09/2024 by Lavell Bari LABOR, FNP.  Called Nurse Triage reporting Leg Pain.  Symptoms began over a year ago.  Interventions attempted: OTC medications: Aleve .  Symptoms are: gradually worsening.  Triage Disposition: See PCP When Office is Open (Within 3 Days)  Patient/caregiver understands and will follow disposition?: Yes               Message from [facility] sent at 11/18/2024 12:02 PM EST  Reason for Triage: Patient is experiencing severe pain in left leg she can barely sleep   Reason for Disposition  [1] MODERATE pain (e.g., interferes with normal activities, limping) AND [2] present > 3 days  Answer Assessment - Initial Assessment Questions Patient has treated the leg pain for over a year with : physical therapy, left hip and back injections, seen orthopedic for it. Said she saw Dr Zollie last time in the office and was put on prednisone  but had to stop when it caused a yeast infection   1. ONSET: When did the pain start?      Over a year ago. Worsening x few weeks.  2. LOCATION: Where is the pain located?      Left leg pain: from  bottom of left buttocks and radiates down to left mid calf  3. PAIN: How bad is the pain?    (Scale 1-10; or mild, moderate, severe)     Currently pain is 5/10 and then worse when bearing weight. Pain was severe last night, couldn't sleep so she took an Aleve  which helped.  4. WORK OR EXERCISE: Has there been any recent work or exercise that involved this part of the body?      No.  5. CAUSE: What do you think is causing the leg pain?     Hx of bursitis, and states it feels similar to when she had sciatica in her right leg but this pain is worse.  6. OTHER SYMPTOMS: Do you have any other symptoms? (e.g., chest pain, back pain, breathing difficulty, swelling, rash, fever, numbness,  weakness)     No chest pain, SOB, left leg or foot cold or blue, dark or bloody urine, fever, back pain, swelling, rash.  Protocols used: Leg Pain-A-AH

## 2024-11-18 NOTE — Telephone Encounter (Signed)
 Noted

## 2024-11-21 ENCOUNTER — Encounter: Payer: Self-pay | Admitting: Family

## 2024-11-21 ENCOUNTER — Ambulatory Visit: Admitting: Family

## 2024-11-21 VITALS — BP 138/72 | HR 71 | Temp 97.5°F | Ht 61.0 in | Wt 194.6 lb

## 2024-11-21 DIAGNOSIS — M79605 Pain in left leg: Secondary | ICD-10-CM | POA: Diagnosis not present

## 2024-11-21 DIAGNOSIS — M25552 Pain in left hip: Secondary | ICD-10-CM | POA: Diagnosis not present

## 2024-11-21 DIAGNOSIS — M7062 Trochanteric bursitis, left hip: Secondary | ICD-10-CM

## 2024-11-21 MED ORDER — METHYLPREDNISOLONE ACETATE 80 MG/ML IJ SUSP
80.0000 mg | Freq: Once | INTRAMUSCULAR | Status: AC
  Administered 2024-11-21: 80 mg via INTRAMUSCULAR

## 2024-11-21 MED ORDER — MELOXICAM 7.5 MG PO TABS: 7.5000 mg | ORAL_TABLET | Freq: Every day | ORAL | 1 refills | Status: AC

## 2024-11-21 MED ORDER — PREDNISONE 10 MG (21) PO TBPK: ORAL_TABLET | ORAL | 0 refills | Status: AC

## 2024-11-21 MED ORDER — KETOROLAC TROMETHAMINE 60 MG/2ML IM SOLN
60.0000 mg | Freq: Once | INTRAMUSCULAR | Status: AC
  Administered 2024-11-21: 60 mg via INTRAMUSCULAR

## 2024-11-21 NOTE — Progress Notes (Signed)
 "  Subjective:    Patient ID: Charlotte Aguirre, female    DOB: [birth year], 79 y.o.   MRN: [id number]  Chief Complaint  Patient presents with   Leg Pain    Left leg pain that has been going on for a year. Gotten worse can't put weight on it. She has had injections for it. Dx with Bursitis in the past. Patient would like taper pack of prednisone . The pain keeps her up she can't sleep. She has been to ortho and PT. She wants answer for what will help her pain.   Pt presents to the office today with left buttuck pain that radiates down her left leg. She has seen Ortho and had multiple hip injections and lumbar injections with mild relief. She has completed PT twice with no relief.   Reports when standing and walking she is having pain. She had MR of the hip on 10/15/23 that showed,  Negative for acute fracture.  2.  Mild insertional tendinosis of the left and gluteus medius with tendinosis of the origins of the hamstrings and tiny intrasubstance partial tears at the origin of the left hamstrings.  3.  Mild to moderate trochanteric bursitis of the hips left more than right.  4.  Degenerative labral changes with partial tear extending into the cartilage labral junction of the anterior superior acetabular labrum on the left.  5.  Mild degenerative facet changes of the lower lumbar spine with small facet joint cyst on the left at L5-S1.   Taking gabapentin  300 mg TID that does not help her left leg pain. She is not taking taking mobic .   Reports Ultram  and Hydrocodone makes her sick and can not tolerate these.  Leg Pain  Associated symptoms include an inability to bear weight and muscle weakness.  Hip Pain  The incident occurred more than 1 week ago. There was no injury mechanism. The pain is present in the left leg and left hip. The quality of the pain is described as aching. Pain scale: hip is a 0 when sitting, but when standing it is a 10. Sitting her leg 5, but can be a 10 when walking. The pain has  been Constant since onset. Associated symptoms include an inability to bear weight and muscle weakness. She reports no foreign bodies present. The symptoms are aggravated by movement. She has tried NSAIDs and non-weight bearing for the symptoms. The treatment provided mild relief.      Review of Systems  All other systems reviewed and are negative.   Social History   Socioeconomic History   Marital status: Married    Spouse name: Nancyann   Number of children: 2   Years of education: 12   Highest education level: High school graduate  Occupational History   Occupation: Retired    Associate Professor: [employer]  Tobacco Use   Smoking status: Former    Current packs/day: 0.00    Average packs/day: 1.5 packs/day for 30.0 years (45.0 ttl pk-yrs)    Types: Cigarettes    Start date: 11/03/1958    Quit date: 11/03/1988    Years since quitting: 36.0   Smokeless tobacco: Never  Vaping Use   Vaping status: Never Used  Substance and Sexual Activity   Alcohol use: No   Drug use: No   Sexual activity: Yes    Partners: Male  Other Topics Concern   Not on file  Social History Narrative   Retired. Lives with husband Nancyann. 2 sons, do not live  locally. One small dog. She enjoys crocheting and quilting. Is involved in her church.   Social Drivers of Health   Tobacco Use: Medium Risk (11/21/2024)   Patient History    Smoking Tobacco Use: Former    Smokeless Tobacco Use: Never    Passive Exposure: Not on file  Financial Resource Strain: Low Risk (08/29/2024)   Overall Financial Resource Strain (CARDIA)    Difficulty of Paying Living Expenses: Not hard at all  Food Insecurity: No Food Insecurity (08/29/2024)   Epic    Worried About Programme Researcher, Broadcasting/film/video in the Last Year: Never true    Ran Out of Food in the Last Year: Never true  Transportation Needs: No Transportation Needs (08/29/2024)   Epic    Lack of Transportation (Medical): No    Lack of Transportation (Non-Medical): No  Physical  Activity: Insufficiently Active (08/29/2024)   Exercise Vital Sign    Days of Exercise per Week: 5 days    Minutes of Exercise per Session: 20 min  Stress: No Stress Concern Present (08/29/2024)   [employer] of Occupational Health - Occupational Stress Questionnaire    Feeling of Stress: Not at all  Social Connections: Moderately Integrated (08/29/2024)   Social Connection and Isolation Panel    Frequency of Communication with Friends and Family: More than three times a week    Frequency of Social Gatherings with Friends and Family: More than three times a week    Attends Religious Services: More than 4 times per year    Active Member of Clubs or Organizations: No    Attends Banker Meetings: Never    Marital Status: Married  Depression (PHQ2-9): Low Risk (11/21/2024)   Depression (PHQ2-9)    PHQ-2 Score: 0  Alcohol Screen: Low Risk (08/29/2024)   Alcohol Screen    Last Alcohol Screening Score (AUDIT): 0  Housing: Unknown (08/29/2024)   Epic    Unable to Pay for Housing in the Last Year: No    Number of Times Moved in the Last Year: Not on file    Homeless in the Last Year: No  Utilities: Not At Risk (08/29/2024)   Epic    Threatened with loss of utilities: No  Health Literacy: Adequate Health Literacy (08/29/2024)   B1300 Health Literacy    Frequency of need for help with medical instructions: Never   Family History  Problem Relation Age of Onset   Migraines Mother    Heart failure Mother    Heart failure Father    Heart attack Father    Glaucoma Father    Heart failure Sister    Heart attack Sister    Diabetes Sister    Obesity Sister    COPD Sister        from 2nd hand smoke - never a smoker   Osteoporosis Sister    Colon polyps Sister    Migraines Brother    Dementia Brother    Heart attack Brother    Heart failure Brother    Heart attack Brother    Cancer Brother        LYMPHOMA   Heart attack Brother    Stomach cancer Brother         lymphoma   Cancer Brother        lymphoma-hip   Thyroid  disease Son    Throat cancer Son    Tongue cancer Son    Migraines Nephew    Colon cancer Neg Hx    Esophageal  cancer Neg Hx    Rectal cancer Neg Hx    Breast cancer Neg Hx         Objective:   Physical Exam Vitals reviewed.  Constitutional:      General: She is not in acute distress.    Appearance: She is well-developed.  HENT:     Head: Normocephalic and atraumatic.  Eyes:     Pupils: Pupils are equal, round, and reactive to light.  Neck:     Thyroid : No thyromegaly.  Cardiovascular:     Rate and Rhythm: Normal rate and regular rhythm.     Heart sounds: Normal heart sounds. No murmur heard. Pulmonary:     Effort: Pulmonary effort is normal. No respiratory distress.     Breath sounds: Normal breath sounds. No wheezing.  Abdominal:     General: Bowel sounds are normal. There is no distension.     Palpations: Abdomen is soft.     Tenderness: There is no abdominal tenderness.  Musculoskeletal:        General: No tenderness. Normal range of motion.     Cervical back: Normal range of motion and neck supple.  Skin:    General: Skin is warm and dry.  Neurological:     Mental Status: She is alert and oriented to person, place, and time.     Cranial Nerves: No cranial nerve deficit.     Deep Tendon Reflexes: Reflexes are normal and symmetric.  Psychiatric:        Behavior: Behavior normal.        Thought Content: Thought content normal.        Judgment: Judgment normal.      BP 138/72   Pulse 71   Temp (!) 97.5 F (36.4 C) (Temporal)   Ht 5' 1 (1.549 m)   Wt 194 lb 9.6 oz (88.3 kg)   BMI 36.77 kg/m      Assessment & Plan:  Ronal DEL Arriaga comes in today with chief complaint of Leg Pain (Left leg pain that has been going on for a year. Gotten worse can't put weight on it. She has had injections for it. Dx with Bursitis in the past. Patient would like taper pack of prednisone . The pain keeps her up she can't  sleep. She has been to ortho and PT. She wants answer for what will help her pain.)   Diagnosis and orders addressed:  1. Pain of left hip joint (Primary) - methylPREDNISolone  acetate (DEPO-MEDROL ) injection 80 mg - ketorolac  (TORADOL ) injection 60 mg - predniSONE  (STERAPRED UNI-PAK 21 TAB) 10 MG (21) TBPK tablet; Use as directed  Dispense: 21 tablet; Refill: 0 - meloxicam  (MOBIC ) 7.5 MG tablet; Take 1 tablet (7.5 mg total) by mouth daily. For joint and muscle pain  Dispense: 90 tablet; Refill: 1  2. Pain of left lower extremity - methylPREDNISolone  acetate (DEPO-MEDROL ) injection 80 mg - ketorolac  (TORADOL ) injection 60 mg - predniSONE  (STERAPRED UNI-PAK 21 TAB) 10 MG (21) TBPK tablet; Use as directed  Dispense: 21 tablet; Refill: 0 - meloxicam  (MOBIC ) 7.5 MG tablet; Take 1 tablet (7.5 mg total) by mouth daily. For joint and muscle pain  Dispense: 90 tablet; Refill: 1  3. Trochanteric bursitis of left hip - predniSONE  (STERAPRED UNI-PAK 21 TAB) 10 MG (21) TBPK tablet; Use as directed  Dispense: 21 tablet; Refill: 0 - meloxicam  (MOBIC ) 7.5 MG tablet; Take 1 tablet (7.5 mg total) by mouth daily. For joint and muscle pain  Dispense: 90 tablet; Refill: 1  Injection given today Start prednisone  Start mobic   ROM exercises  Call Ortho and make follow up   Bari Learn, FNP   "

## 2024-11-21 NOTE — Patient Instructions (Signed)
Hip Exercises Ask your health care provider which exercises are safe for you. Do exercises exactly as told by your provider and adjust them as told. It is normal to feel mild stretching, pulling, tightness, or discomfort as you do these exercises. Stop right away if you feel sudden pain or your pain gets worse. Do not begin these exercises until told by your provider. Stretching and range-of-motion exercises These exercises warm up your muscles and joints and improve the movement and flexibility of your hip. They also help to relieve pain, numbness, and tingling. You may be asked to limit your range of motion if you had a hip replacement. Talk to your provider about these limits. Hamstrings, supine  Lie on your back (supine position). Loop a belt, towel, or exercise band over the ball of your left / right foot. The ball of your foot is on the walking surface, right under your toes. Straighten your left / right knee and slowly pull on the belt, towel, or band to raise your leg until you feel a gentle stretch behind your knee (hamstring). Do not let your knee bend while you do this. Keep your other leg flat on the floor. Hold this position for __________ seconds. Slowly return your leg to the starting position. Repeat __________ times. Complete this exercise __________ times a day. Hip rotation  Lie on your back on a firm surface. With your left / right hand, gently pull your left / right knee toward the shoulder that is on the same side of the body. Stop when your knee is pointing toward the ceiling. Hold your left / right ankle with your other hand. Keeping your knee steady, gently pull your left / right ankle toward your other shoulder until you feel a stretch in your butt. Keep your hips and shoulders firmly planted while you do this stretch. Hold this position for __________ seconds. Repeat __________ times. Complete this exercise __________ times a day. Seated stretch This exercise is  sometimes called hamstrings and adductors stretch. Sit on the floor with your legs stretched wide. Keep your knees straight during this exercise. Keeping your head and back in a straight line, bend at your waist to reach for your left foot (position A). You should feel a stretch in your right inner thigh (adductors). Hold this position for __________ seconds. Then slowly return to the upright position. Keeping your head and back in a straight line, bend at your waist to reach forward (position B). You should feel a stretch behind both of your thighs and knees (hamstrings). Hold this position for __________ seconds. Then slowly return to the upright position. Keeping your head and back in a straight line, bend at your waist to reach for your right foot (position C). You should feel a stretch in your left inner thigh (adductors). Hold this position for __________ seconds. Then slowly return to the upright position. Repeat __________ times. Complete this exercise __________ times a day. Lunge This exercise stretches the muscles of the hip (hip flexors). Place your left / right knee on the floor and bend your other knee so that is directly over your ankle. You should be half-kneeling. Keep good posture with your head over your shoulders. Tighten your butt muscles to point your tailbone downward. This will prevent your back from arching too much. You should feel a gentle stretch in the front of your left / right thigh and hip. If you do not feel a stretch, slide your other foot forward slightly and  then slowly lunge forward with your chest up until your knee once again lines up over your ankle. Make sure your tailbone continues to point downward. Hold this position for __________ seconds. Slowly return to the starting position. Repeat __________ times. Complete this exercise __________ times a day. Strengthening exercises These exercises build strength and endurance in your hip. Endurance is the  ability to use your muscles for a long time, even after they get tired. Bridge This exercise strengthens the muscles of your hip (hip extensors). Lie on your back on a firm surface with your knees bent and your feet flat on the floor. Tighten your butt muscles and lift your bottom off the floor until the trunk of your body and your hips are level with your thighs. Do not arch your back. You should feel the muscles working in your butt and the back of your thighs. If you do not feel these muscles, slide your feet 1-2 inches (2.5-5 cm) farther away from your butt. Hold this position for __________ seconds. Slowly lower your hips to the starting position. Let your muscles relax completely between repetitions. Repeat __________ times. Complete this exercise __________ times a day. Straight leg raises, side-lying This exercise strengthens the muscles that move the hip joint away from the center of the body (hip abductors). Lie on your side with your left / right leg in the top position. Lie so your head, shoulder, hip, and knee line up. You may bend your bottom knee slightly to help you balance. Roll your hips slightly forward, so your hips are stacked directly over each other and your left / right knee is facing forward. Leading with your heel, lift your top leg 4-6 inches (10-15 cm). You should feel the muscles in your top hip lifting. Do not let your foot drift forward. Do not let your knee roll toward the ceiling. Hold this position for __________ seconds. Slowly return to the starting position. Let your muscles relax completely between repetitions. Repeat __________ times. Complete this exercise __________ times a day. Straight leg raises, side-lying This exercise strengthens the muscles that move the hip joint toward the center of the body (hip adductors). Lie on your side with your left / right leg in the bottom position. Lie so your head, shoulder, hip, and knee line up. You may place your  upper foot in front to help you balance. Roll your hips slightly forward, so your hips are stacked directly over each other and your left / right knee is facing forward. Tense the muscles in your inner thigh and lift your bottom leg 4-6 inches (10-15 cm). Hold this position for __________ seconds. Slowly return to the starting position. Let your muscles relax completely between repetitions. Repeat __________ times. Complete this exercise __________ times a day. Straight leg raises, supine This exercise strengthens the muscles in the front of your thigh (quadriceps and hip flexors). Lie on your back (supine position) with your left / right leg extended and your other knee bent. Tense the muscles in the front of your left / right thigh. You should see your kneecap slide up or see increased dimpling just above your knee. Keep these muscles tight as you raise your leg 4-6 inches (10-15 cm) off the floor. Do not let your knee bend. Hold this position for __________ seconds. Keep these muscles tense as you lower your leg. Relax the muscles slowly and completely between repetitions. Repeat __________ times. Complete this exercise __________ times a day. Hip abductors, standing This  exercise strengthens the muscles that move the leg and hip joint away from the center of the body (hip abductors). Tie one end of a rubber exercise band or tubing to a secure surface, such as a chair, table, or pole. Loop the other end of the band or tubing around your left / right ankle. Keeping your ankle with the band or tubing directly opposite the secured end, step away until there is tension in the tubing or band. Hold on to a chair, table, or pole as needed for balance. Lift your left / right leg out to your side. While you do this: Keep your back upright. Keep your shoulders over your hips. Keep your toes pointing forward. Make sure to use your hip muscles to slowly lift your leg. Do not tip your body or  forcefully lift your leg. Hold this position for __________ seconds. Slowly return to the starting position. Repeat __________ times. Complete this exercise __________ times a day. Squats This exercise strengthens the muscles in the front of your thigh (quadriceps). Stand in front of a table, or stand in a doorframe so your feet and knees are in line with the frame. You may place your hands on the table or frame for balance. Slowly bend your knees and lower your hips like you are going to sit in a chair. Keep your lower legs in a straight up-and-down position. Do not let your hips go lower than your knees. Do not bend your knees lower than told by your provider. If your hip pain increases, do not bend as low. Hold this position for ___________ seconds. Slowly push with your legs to return to standing. Do not use your hands to pull yourself to standing. Repeat __________ times. Complete this exercise __________ times a day. This information is not intended to replace advice given to you by your health care provider. Make sure you discuss any questions you have with your health care provider. Document Revised: 06/24/2022 Document Reviewed: 06/24/2022 [employer] Patient Education  2024 [employer].

## 2024-11-23 ENCOUNTER — Telehealth: Payer: Self-pay

## 2024-11-23 NOTE — Telephone Encounter (Signed)
Left message making pt aware of Dr. Merita Norton recommendations and to call back if needed.

## 2024-11-23 NOTE — Telephone Encounter (Signed)
 Copied from CRM #[id number]. Topic: Clinical - Medical Advice >> Nov 23, 2024  8:31 AM Charlotte Aguirre wrote: Reason for CRM: Patient came in on the 01/19 to see provider and was put on predniSONE  (STERAPRED UNI-PAK 21 TAB) 10 MG (21) TBPK tablet, and now she has a headache and hot flashes and she is not going to be able to take it anymore. She isnt getting any sleep. She did not realize the low dose would do that because it hasn't before. The pain has stopped in her legs but its effecting everything else. Could you assist? She is just wanting the provider to know. Callback number is [phone].

## 2024-11-23 NOTE — Telephone Encounter (Signed)
 Have her stop taking the prednisone  pack and that should get her out of her system in a day or 2, especially if her legs were feeling better.

## 2024-12-06 ENCOUNTER — Other Ambulatory Visit: Payer: Self-pay | Admitting: Family

## 2024-12-06 DIAGNOSIS — G609 Hereditary and idiopathic neuropathy, unspecified: Secondary | ICD-10-CM

## 2024-12-28 ENCOUNTER — Institutional Professional Consult (permissible substitution) (INDEPENDENT_AMBULATORY_CARE_PROVIDER_SITE_OTHER): Admitting: Otolaryngology

## 2025-01-06 ENCOUNTER — Ambulatory Visit: Admitting: Family
